# Patient Record
Sex: Female | Born: 1960
Health system: Southern US, Community
[De-identification: ages and names within clinical notes are randomized; demographics above are authoritative.]

## PROBLEM LIST (undated history)

## (undated) DIAGNOSIS — E049 Nontoxic goiter, unspecified: Secondary | ICD-10-CM

## (undated) DIAGNOSIS — D869 Sarcoidosis, unspecified: Secondary | ICD-10-CM

## (undated) DIAGNOSIS — G4733 Obstructive sleep apnea (adult) (pediatric): Secondary | ICD-10-CM

## (undated) DIAGNOSIS — Z8489 Family history of other specified conditions: Secondary | ICD-10-CM

## (undated) DIAGNOSIS — R002 Palpitations: Secondary | ICD-10-CM

## (undated) DIAGNOSIS — T7840XA Allergy, unspecified, initial encounter: Secondary | ICD-10-CM

## (undated) DIAGNOSIS — E559 Vitamin D deficiency, unspecified: Secondary | ICD-10-CM

## (undated) DIAGNOSIS — E785 Hyperlipidemia, unspecified: Secondary | ICD-10-CM

## (undated) DIAGNOSIS — I10 Essential (primary) hypertension: Secondary | ICD-10-CM

## (undated) HISTORY — PX: TONSILLECTOMY: SUR1361

## (undated) HISTORY — DX: Morbid (severe) obesity due to excess calories: E66.01

## (undated) HISTORY — DX: Palpitations: R00.2

## (undated) HISTORY — DX: Hyperlipidemia, unspecified: E78.5

## (undated) HISTORY — PX: ABDOMINAL HYSTERECTOMY: SHX81

## (undated) HISTORY — PX: OTHER SURGICAL HISTORY: SHX169

## (undated) HISTORY — PX: EYE SURGERY: SHX253

## (undated) HISTORY — DX: Obstructive sleep apnea (adult) (pediatric): G47.33

## (undated) HISTORY — DX: Allergy, unspecified, initial encounter: T78.40XA

## (undated) HISTORY — DX: Nontoxic goiter, unspecified: E04.9

## (undated) HISTORY — DX: Vitamin D deficiency, unspecified: E55.9

## (undated) HISTORY — DX: Sarcoidosis, unspecified: D86.9

---

## 1999-01-21 ENCOUNTER — Other Ambulatory Visit: Admission: RE | Admit: 1999-01-21 | Discharge: 1999-01-21 | Payer: Self-pay | Admitting: *Deleted

## 1999-09-15 ENCOUNTER — Encounter (HOSPITAL_COMMUNITY): Admission: RE | Admit: 1999-09-15 | Discharge: 1999-12-14 | Payer: Self-pay | Admitting: Internal Medicine

## 2000-02-11 ENCOUNTER — Encounter: Payer: Self-pay | Admitting: Internal Medicine

## 2000-02-11 ENCOUNTER — Ambulatory Visit (HOSPITAL_COMMUNITY): Admission: RE | Admit: 2000-02-11 | Discharge: 2000-02-11 | Payer: Self-pay | Admitting: Internal Medicine

## 2000-02-11 ENCOUNTER — Other Ambulatory Visit: Admission: RE | Admit: 2000-02-11 | Discharge: 2000-02-11 | Payer: Self-pay | Admitting: *Deleted

## 2001-03-06 ENCOUNTER — Other Ambulatory Visit: Admission: RE | Admit: 2001-03-06 | Discharge: 2001-03-06 | Payer: Self-pay | Admitting: Gynecology

## 2001-05-24 ENCOUNTER — Encounter: Payer: Self-pay | Admitting: Gynecology

## 2001-05-31 ENCOUNTER — Encounter (INDEPENDENT_AMBULATORY_CARE_PROVIDER_SITE_OTHER): Payer: Self-pay | Admitting: Specialist

## 2001-05-31 ENCOUNTER — Inpatient Hospital Stay (HOSPITAL_COMMUNITY): Admission: RE | Admit: 2001-05-31 | Discharge: 2001-06-01 | Payer: Self-pay | Admitting: Gynecology

## 2001-08-01 ENCOUNTER — Encounter: Payer: Self-pay | Admitting: Internal Medicine

## 2001-08-01 ENCOUNTER — Ambulatory Visit (HOSPITAL_COMMUNITY): Admission: RE | Admit: 2001-08-01 | Discharge: 2001-08-01 | Payer: Self-pay | Admitting: Internal Medicine

## 2002-08-28 ENCOUNTER — Ambulatory Visit (HOSPITAL_COMMUNITY): Admission: RE | Admit: 2002-08-28 | Discharge: 2002-08-28 | Payer: Self-pay | Admitting: Internal Medicine

## 2002-08-28 ENCOUNTER — Encounter: Payer: Self-pay | Admitting: Internal Medicine

## 2003-04-30 ENCOUNTER — Encounter: Payer: Self-pay | Admitting: Internal Medicine

## 2003-04-30 ENCOUNTER — Ambulatory Visit (HOSPITAL_COMMUNITY): Admission: RE | Admit: 2003-04-30 | Discharge: 2003-04-30 | Payer: Self-pay | Admitting: Internal Medicine

## 2003-08-26 ENCOUNTER — Other Ambulatory Visit: Admission: RE | Admit: 2003-08-26 | Discharge: 2003-08-26 | Payer: Self-pay | Admitting: Gynecology

## 2004-01-29 ENCOUNTER — Ambulatory Visit (HOSPITAL_COMMUNITY): Admission: RE | Admit: 2004-01-29 | Discharge: 2004-01-29 | Payer: Self-pay | Admitting: Internal Medicine

## 2005-03-15 ENCOUNTER — Other Ambulatory Visit: Admission: RE | Admit: 2005-03-15 | Discharge: 2005-03-15 | Payer: Self-pay | Admitting: Gynecology

## 2005-03-23 ENCOUNTER — Ambulatory Visit (HOSPITAL_COMMUNITY): Admission: RE | Admit: 2005-03-23 | Discharge: 2005-03-23 | Payer: Self-pay | Admitting: Internal Medicine

## 2005-12-29 ENCOUNTER — Encounter: Admission: RE | Admit: 2005-12-29 | Discharge: 2005-12-29 | Payer: Self-pay | Admitting: Internal Medicine

## 2006-04-06 ENCOUNTER — Ambulatory Visit (HOSPITAL_COMMUNITY): Admission: RE | Admit: 2006-04-06 | Discharge: 2006-04-06 | Payer: Self-pay | Admitting: Internal Medicine

## 2006-04-06 ENCOUNTER — Encounter: Payer: Self-pay | Admitting: Vascular Surgery

## 2007-08-25 ENCOUNTER — Emergency Department (HOSPITAL_COMMUNITY): Admission: EM | Admit: 2007-08-25 | Discharge: 2007-08-26 | Payer: Self-pay | Admitting: Emergency Medicine

## 2009-03-21 ENCOUNTER — Encounter: Admission: RE | Admit: 2009-03-21 | Discharge: 2009-03-21 | Payer: Self-pay | Admitting: Internal Medicine

## 2009-12-10 ENCOUNTER — Encounter: Admission: RE | Admit: 2009-12-10 | Discharge: 2009-12-10 | Payer: Self-pay | Admitting: Optometry

## 2010-01-16 ENCOUNTER — Ambulatory Visit: Payer: Self-pay | Admitting: Vascular Surgery

## 2010-01-16 ENCOUNTER — Encounter (INDEPENDENT_AMBULATORY_CARE_PROVIDER_SITE_OTHER): Payer: Self-pay | Admitting: Internal Medicine

## 2010-01-16 ENCOUNTER — Ambulatory Visit: Admission: RE | Admit: 2010-01-16 | Discharge: 2010-01-16 | Payer: Self-pay | Admitting: Internal Medicine

## 2010-07-27 ENCOUNTER — Encounter: Payer: Self-pay | Admitting: Cardiovascular Disease

## 2010-07-30 ENCOUNTER — Encounter: Payer: Self-pay | Admitting: Cardiovascular Disease

## 2010-08-02 ENCOUNTER — Emergency Department (HOSPITAL_COMMUNITY): Admission: EM | Admit: 2010-08-02 | Discharge: 2010-08-03 | Payer: Self-pay | Admitting: Family Medicine

## 2010-08-03 ENCOUNTER — Encounter: Payer: Self-pay | Admitting: Cardiovascular Disease

## 2010-08-04 ENCOUNTER — Ambulatory Visit: Payer: Self-pay | Admitting: Cardiovascular Disease

## 2010-08-10 ENCOUNTER — Encounter: Payer: Self-pay | Admitting: Cardiovascular Disease

## 2010-08-10 ENCOUNTER — Ambulatory Visit: Payer: Self-pay

## 2010-08-10 ENCOUNTER — Ambulatory Visit: Payer: Self-pay | Admitting: Internal Medicine

## 2010-08-10 ENCOUNTER — Ambulatory Visit (HOSPITAL_COMMUNITY): Admission: RE | Admit: 2010-08-10 | Discharge: 2010-08-10 | Payer: Self-pay | Admitting: Cardiovascular Disease

## 2010-08-14 ENCOUNTER — Telehealth: Payer: Self-pay | Admitting: Cardiovascular Disease

## 2010-08-31 ENCOUNTER — Ambulatory Visit: Payer: Self-pay | Admitting: Cardiovascular Disease

## 2010-09-18 ENCOUNTER — Ambulatory Visit: Payer: Self-pay | Admitting: Vascular Surgery

## 2010-09-18 ENCOUNTER — Ambulatory Visit (HOSPITAL_COMMUNITY): Admission: RE | Admit: 2010-09-18 | Discharge: 2010-09-18 | Payer: Self-pay | Admitting: Family Medicine

## 2010-09-18 ENCOUNTER — Encounter (INDEPENDENT_AMBULATORY_CARE_PROVIDER_SITE_OTHER): Payer: Self-pay | Admitting: Family Medicine

## 2010-12-03 NOTE — Letter (Signed)
Summary: [facility]  [facility]   Imported By: Marylou Mccoy 09/21/2010 13:56:36  _____________________________________________________________________  External Attachment:    Type:   Image     Comment:   External Document

## 2010-12-03 NOTE — Letter (Signed)
Summary: [facility]  [facility]   Imported By: Marylou Mccoy 09/21/2010 13:34:37  _____________________________________________________________________  External Attachment:    Type:   Image     Comment:   External Document

## 2010-12-03 NOTE — Assessment & Plan Note (Signed)
Summary: per check out/sf   Visit Type:  1 mo f/u Primary Provider:  Dr. Oneta Rack  CC:  NO chest pain or SOB.  History of Present Illness: 50 yo female with history of HTN, hyperlipidemia and DM here  today for follow up. She was seen as a new patient three weeks ago for further evaluation of uncontrolled HTN. She has been seen by Dr. Oneta Rack  and managed appropriately with Diovan/HCT, Atenolol and clonidine.  No dizziness, near syncope or syncope. No visual changes. She did  describe "hot flashes". At the first visit, I added Norvasc 5 mg per day and ordered an echo and renal artery dopplers. These results are outlined below. She has been feeling much better since her last visit. No complaints today.    Current Medications (verified): 1)  Atenolol 100 Mg Tabs (Atenolol) .Marland Kitchen.. 1 Tab Once Daily 2)  Diovan 320 Mg Tabs (Valsartan) .Marland Kitchen.. 1 Tab Once Daily 3)  Metformin Hcl 500 Mg Tabs (Metformin Hcl) .... 2 Tab Two Times A Day 4)  Clonidine Hcl 0.1 Mg Tabs (Clonidine Hcl) .Marland Kitchen.. 1 Tab Three Times A Day 5)  Vitamin D 1000 Unit Tabs (Cholecalciferol) .Marland Kitchen.. 1 Tab Weekly 6)  Multivitamins   Tabs (Multiple Vitamin) .Marland Kitchen.. 1 Tab Once Daily 7)  Amlodipine Besylate 5 Mg Tabs (Amlodipine Besylate) .... Take One Tablet By Mouth Daily 8)  Magnesium Oxide 400 Mg Caps (Magnesium Oxide) .Marland Kitchen.. 1 Cap Once Daily  Allergies (verified): No Known Drug Allergies  Past History:  Past Medical History: Reviewed history from 08/03/2010 and no changes required. DM (ICD-250.00) HYPERLIPIDEMIA (ICD-272.4) HYPERTENSION (ICD-401.9)    Social History: Reviewed history from 08/04/2010 and no changes required. No alcohol, drugs or tobacco. 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    Vital Signs:  Patient profile:   50 year old female Height:      65 inches Weight:      234.8 pounds BMI:     39.21 Pulse rate:   58 / minute Pulse rhythm:   irregular BP sitting:   160 / 80  (left arm) Cuff size:   large  Vitals Entered By: Danielle Rankin, CMA (August 31, 2010 4:25 PM)  Physical Exam  General:  General: Well developed, well nourished, NAD HEENT: OP clear, mucus membranes moist Musculoskeletal: Muscle strength 5/5 all ext Psychiatric: Mood and affect normal Neck: No JVD, no carotid bruits, no thyromegaly, no lymphadenopathy. Lungs:Clear bilaterally, no wheezes, rhonci, crackles CV: RRR no murmurs, gallops rubs Abdomen: soft, NT, ND, BS present Extremities: No edema, pulses 2+.    Arterial Doppler  Procedure date:  08/10/2010  Findings:      No evidence of renal artery stenosis.    Echocardiogram  Procedure date:  08/10/2010  Findings:      Left ventricle: The cavity size was normal. Wall thickness was     normal. Systolic function was normal. The estimated ejection     fraction was in the range of 60% to 65%. Wall motion was normal;     there were no regional wall motion abnormalities.   Impression &  Recommendations:  Problem # 1:  HYPERTENSION (ICD-401.9) No evidence of hypertensive heart disease on echo. No evidence of renal artery stenosis on dopplers. I will increase her Norvasc to 10 mg per day. No reversible causes of HTN isolated. I will have her return back to see Dr. Oneta Rack. Consider workup for pheochromocytoma in primary care. Also can consider switching her atenolol to Coreg for better BP control.   Her updated medication list for this problem  includes:    Atenolol 100 Mg Tabs (Atenolol) .Marland Kitchen... 1 tab once daily    Diovan 320 Mg Tabs (Valsartan) .Marland Kitchen... 1 tab once daily    Clonidine Hcl 0.1 Mg Tabs (Clonidine hcl) .Marland Kitchen... 1 tab three times a day    Amlodipine Besylate 10 Mg Tabs (Amlodipine besylate) .Marland Kitchen... Take one tablet by mouth daily  Patient Instructions: 1)  Your physician recommends that you schedule a follow-up appointment as needed. 2)  Your physician has recommended you make the following change in your medication: INCREASE NORVASC to 10mg  by mouth daily.  Prescriptions: AMLODIPINE BESYLATE 10 MG TABS (AMLODIPINE BESYLATE) Take one tablet by mouth daily  #30 x 13   Entered by:   Whitney Maeola Sarah RN   Authorized by:   Verne Carrow, MD   Signed by:   Ellender Hose RN on 08/31/2010   Method used:   Electronically to        [facility] [address] [phone]* (retail)       [address]       [place], [place]  [address]       Ph: [phone] or [phone]       Fax: [phone]   RxID:   [id number]

## 2010-12-03 NOTE — Letter (Signed)
Summary: Return To Work  [employer], Main Office  [address]   [place], [place] [address]   Phone: [phone]  Fax: [phone]    08/04/2010  TO: Leodis Sias IT MAY CONCERN   RE: JAZMINN POMALES [address] [place]   The above named individual is under my medical care and may return to work on Thursday Oct. 6th, 2011. Please excuse her for any absences she has had at work.   If you have any further questions or need additional information, please call our office at [phone].    Sincerely,  Dr. Verne Carrow.

## 2010-12-03 NOTE — Progress Notes (Signed)
Summary: test results  Phone Note Call from Patient Call back at Work Phone [phone] Call back at [phone]   Caller: Patient Reason for Call: Lab or Test Results Initial call taken by: Judie Grieve,  August 14, 2010 11:04 AM  Follow-up for Phone Call        pt aware Follow-up by: Whitney Maeola Sarah RN,  August 14, 2010 11:32 AM

## 2010-12-03 NOTE — Letter (Signed)
Summary: [facility]  [facility]   Imported By: Marylou Mccoy 09/21/2010 13:55:32  _____________________________________________________________________  External Attachment:    Type:   Image     Comment:   External Document

## 2010-12-03 NOTE — Assessment & Plan Note (Signed)
Summary: np6/ accelerate htn, no responding to treatment. pt has uhc/ gd   Visit Type:  new pt visit Primary Tanya [name]:  Dr. Oneta Rack  CC:  elevated BP no responding to Tx....denies any other complaints today.  History of Present Illness: 50 yo female with history of HTN, hyperlipidemia and DM referred today for further evaluation of uncontrolled HTN. She has been seen by Dr. Oneta Rack  and managed appropriately with Diovan/HCT, Atenolol and clonidine. She had been on Hyzaar for many years with good control of her BP. She went to the beach three weeks ago and had frontal headache, nausea and hot flashes. Last week, she still felt bad and her BP was elevated with systolic BP of 170s. She began to feel better. She went in to the hospital two nights ago and her BP was 200 systolically. She was discharged home with systolic blood pressure of 160s. Over the last two days, she describes a dull headache. She has had no chest pain or SOB. No dizziness, near syncope or syncope. No visual changes. She does describe "hot flashes".    Current Medications (verified): 1)  Atenolol 100 Mg Tabs (Atenolol) .Marland Kitchen.. 1 Tab Once Daily 2)  Diovan 320 Mg Tabs (Valsartan) .Marland Kitchen.. 1 Tab Once Daily 3)  Metformin Hcl 500 Mg Tabs (Metformin Hcl) .... 2 Tab Two Times A Day 4)  Clonidine Hcl 0.1 Mg Tabs (Clonidine Hcl) .Marland Kitchen.. 1 Tab Once Daily 5)  Vitamin D 1000 Unit Tabs (Cholecalciferol) .Marland Kitchen.. 1 Tab Weekly 6)  Multivitamins   Tabs (Multiple Vitamin) .Marland Kitchen.. 1 Tab Once Daily  Allergies (verified): No Known Drug Allergies  Past History:  Past Medical History: Reviewed history from 08/03/2010 and no changes required. DM (ICD-250.00) HYPERLIPIDEMIA (ICD-272.4) HYPERTENSION (ICD-401.9)    Past Surgical History: Achilles tendon repair May 2011 Hysterectomy 2001 Tonsillectomy as child  Family History: Mother-alive, HTN Father-alive, DM, HTN 1 sister and 1 brother both alive and healthy  Social History: No alcohol, drugs or  tobacco. 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  The patient denies fatigue, malaise, fever, weight gain/loss, vision loss, decreased hearing, hoarseness, chest pain, palpitations, shortness of breath, prolonged cough, wheezing, sleep apnea, coughing up blood, abdominal pain, blood in stool, nausea, vomiting, diarrhea, heartburn, incontinence, blood in urine, muscle weakness, joint pain, leg swelling, rash, skin lesions, fainting, dizziness, depression, anxiety, enlarged lymph nodes, easy bruising or bleeding, and environmental allergies.    Vital Signs:  Patient profile:   50 year old female Height:      65 inches Weight:      226.12 pounds BMI:     37.76 Pulse rate:   53 / minute Pulse rhythm:   irregular BP sitting:   160 / 90  (left arm) Cuff size:   large  Vitals Entered By: Danielle Rankin, CMA (August 04, 2010 10:46 AM)  Physical Exam  General:  General: Well developed, well nourished, NAD HEENT: OP clear, mucus membranes moist SKIN: warm, dry Neuro: No focal deficits Musculoskeletal: Muscle strength 5/5 all ext Psychiatric: Mood and affect normal Neck: No JVD, no carotid bruits, no thyromegaly, no lymphadenopathy. Lungs:Clear bilaterally, no wheezes, rhonci, crackles CV: RRR no murmurs, gallops rubs Abdomen: soft, NT, ND, BS present Extremities: No edema, pulses 2+.    EKG  Procedure date:  08/04/2010  Findings:      Sinus bradycardia, rate 53 bpm. T wave inversion III, AVF, V4-V5.  Impression &  Recommendations:  Problem # 1:  HYPERTENSION (ICD-401.9) Recently uncontrolled HTN. I agree with her current medication regimen. Will add Norvasc 5 mg by mouth Qdaily. Consider changed beta blocker to Coreg for better BP control. Will order echo to assess LV systolic function and LV size/thickness. Will order renal artery dopplers to exclude renal artery stenosis. I will see her back in several weeks to  review.  She will continue to follow her BP at home. If the renal artery dopplers are normal, will suggest that a workup to exclude  a catecholamine secreting tumor like pheochromocytoma be initiated in primary care. (24 hour urine catecholamines and metanephrines).  Her updated medication list for this problem includes:    Atenolol 100 Mg Tabs (Atenolol) .Marland Kitchen... 1 tab once daily    Diovan 320 Mg Tabs (Valsartan) .Marland Kitchen... 1 tab once daily    Clonidine Hcl 0.1 Mg Tabs (Clonidine hcl) .Marland Kitchen... 1 tab once daily    Amlodipine Besylate 5 Mg Tabs (Amlodipine besylate) .Marland Kitchen... Take one tablet by mouth daily  Orders: Echocardiogram (Echo) Renal Artery Duplex (Renal Artery Duplex) EKG w/ Interpretation (93000)  Patient Instructions: 1)  Your physician recommends that you schedule a follow-up appointment in: 3-4 weeks. 2)  Your physician has requested that you have a renal artery duplex. During this test, an ultrasound is used to evaluate blood flow to the kidneys. Allow one hour for this exam. Do not eat after midnight the day before and avoid carbonated beverages. Take your medications as you usually do. 3)  Your physician has requested that you have an echocardiogram.  Echocardiography is a painless test that uses sound waves to create images of your heart. It provides your doctor with information about the size and shape of your heart and how well your heart's chambers and valves are working.  This procedure takes approximately one hour. There are no restrictions for this procedure. 4)  Your physician has recommended you make the following change in your medication: START AMLODIPINE 5 mg by mouth daily for you blood pressure. Prescriptions: AMLODIPINE BESYLATE 5 MG TABS (AMLODIPINE BESYLATE) Take one tablet by mouth daily  #30 x 3   Entered by:   Whitney Maeola Sarah RN   Authorized by:   Verne Carrow, MD   Signed by:   Ellender Hose RN on 08/04/2010   Method used:   [facility]        [facility] [address] [phone]* (retail)       [address]       [place], [place]  [address]       Ph: [phone] or [phone]       Fax: [phone]   RxID:   [id number]

## 2011-01-13 LAB — URINALYSIS, ROUTINE W REFLEX MICROSCOPIC
Glucose, UA: NEGATIVE mg/dL
Ketones, ur: NEGATIVE mg/dL
Nitrite: NEGATIVE
Specific Gravity, Urine: 1.012 (ref 1.005–1.030)
Urobilinogen, UA: 0.2 mg/dL (ref 0.0–1.0)
pH: 6 (ref 5.0–8.0)

## 2011-01-13 LAB — BASIC METABOLIC PANEL
BUN: 13 mg/dL (ref 6–23)
Calcium: 9.4 mg/dL (ref 8.4–10.5)
Chloride: 100 mEq/L (ref 96–112)
Creatinine, Ser: 0.58 mg/dL (ref 0.4–1.2)
GFR calc Af Amer: >60 mL/min (ref >60)
GFR calc non Af Amer: >60 mL/min (ref >60)
Potassium: 4.1 mEq/L (ref 3.5–5.1)
Sodium: 139 mEq/L (ref 135–145)

## 2011-01-13 LAB — URINE CULTURE: Culture  Setup Time: 201110030904

## 2011-01-13 LAB — URINE MICROSCOPIC-ADD ON

## 2011-03-19 NOTE — H&P (Signed)
[facility]  Patient:    Tanya [name], Tanya [name]                 MRN: [id number] Adm. Date:  [id number] Attending:  Rolinda Roan CC:         Marinus Maw, M.D.   History and Physical  ADMITTING DIAGNOSIS:  Fibroid uterus.  HISTORY OF PRESENT ILLNESS:  The patient is a 50 year old nulligravida black female who was kindly referred by Dr. Marinus Maw with a fibroid uterus, increasing dysmenorrhea and menorrhagia and anemia, for evaluation and treatment.  The patient underwent an abdominal myomectomy by another physician approximately four to five years ago.  She has been on Loestrin Fe 1.5/30 for the last four to five years and always has dysmenorrhea, which has been increasing, and her menorrhagia has been increasing significantly and the patient is currently anemic with a hemoglobin approximately 9.  The patient wishes never to be pregnant.  Multiple options were given to the patient regarding possible treatments of her fibroids and she wishes to proceed with hysterectomy.  Ultrasound examination revealed multiple fibroids with normal-appearing ovaries.  A total abdominal hysterectomy was discussed with the patient in detail and complications including, but not limited to, injury to the bowel, bladder, ureters, possible fistula formation, possible blood loss with transfusion and its sequelae and possible infection in the pelvis or wound have been discussed with the patient.  Increased risks secondary to the patients obesity and previous myomectomy have been discussed.  She wishes that the ovaries be taken only if surgically indicated or significant disease. Patient requested that if there was a borderline amount of adhesions involving the ovaries, that the ovaries be left.  The patient is aware that she may have postoperative pain or other problems involving the ovaries that would require a subsequent surgery to remove the ovaries.   Patient understands that she will have no menstrual periods and no possibility for pregnancy after hysterectomy. Postoperative expectations and restrictions have been reviewed with the patient.  The patient is therefore admitted for a total abdominal hysterectomy, ? bilateral salpingo-oophorectomy.  PAST MEDICAL HISTORY:  Surgical:  T&A, appendectomy, myomectomy.  Medical: Hypertension.  MEDICATIONS:  Hyzaar, ______ and the patient had been taking Loestrin, which she has stopped preoperatively.  ALLERGIES:  None known.  SOCIAL HISTORY:  Smokes none.  ETOH:  None.  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  FAMILY HISTORY:  Negative for carcinoma.  PHYSICAL EXAMINATION:  HEENT:  Negative.  HEART:  Without murmurs.  LUNGS:  Clear.  BREASTS:  Without masses or discharge.  ABDOMEN:  Obese, soft and nontender.  PELVIC:  Exam reveals BUS, vagina and cervix to be normal, uterus approximately 16+ weeks size and irregular.  The adnexa are without palpable masses.  RECTAL:  Negative.  EXTREMITIES:  Negative.  IMPRESSION: 1. Menorrhagia. 2. Anemia. 3. Fibroid uterus. 4. Hypertension. 5. Dysmenorrhea. 6. Obesity.  PLAN:  Total abdominal hysterectomy and ? bilateral salpingo-oophorectomy. DD:  05/31/01 TD:  06/01/01 Job: [id number]

## 2011-03-19 NOTE — Op Note (Signed)
[facility]  Patient:    Tanya [name], Tanya [name]                 MRN: [id number] Proc. Date: 05/31/01 Adm. Date:  [id number] Attending:  Rolinda Roan CC:         Marinus Maw, M.D.  Harl Bowie, M.D.   Operative Report  PREOPERATIVE DIAGNOSES:  Fibroid uterus, menorrhagia, and anemia.  POSTOPERATIVE DIAGNOSES:  Fibroid uterus, menorrhagia, anemia, and pelvic adhesions.  OPERATION PERFORMED:  Total abdominal hysterectomy and enterolysis of adhesions.  SURGEON:  Leatha Gilding. Mezer, M.D.  ASSISTANT:  Harl Bowie, M.D.  ANESTHESIA:  General endotracheal.  PREPARATION:  Betadine.  DESCRIPTION OF PROCEDURE:  With the patient in the supine position, prepped and draped in the routine fashion.  A Pfannenstiel incision was made through an old scar.  The incision was carried down to the subcutaneous tissue where there was significant scarring, and the fascia was severely scarred.  A very careful dissection was employed to open the fascia to give optimal exposure and optimal potential for good closure.  The peritoneum was opened without difficulty, and a brief exploration of her abdomen was benign.  Exploration of the pelvis revealed the uterus to be approximately six weeks in size with multiple leiomyomata, and the bladder was significantly advanced on the uterus.  There were dense adhesions of bowel to the posterior and inferior aspect of the uterus.  A significant amount of time at the beginning of the procedure was required to restore normal anatomy and taking these adhesions down in layers, using cautery and a scissor dissection.  There appeared to be no trauma to the bowel.  Both ovaries were lightly adherent to the pelvic sidewalls, and the ovary on the right was adherent to the posterior broad ligament.  Per the patients strong request, the ovaries were left for a more careful evaluation at the end of the procedure.  After the  adhesions had been lysed, freeing the bowel from the uterus, the round ligaments were suture ligated with #1 chromic and divided.  The location of the fibroids made the beginning part of the procedure quite challenging.  The bladder flap which had been significantly advanced was taken down again in layers.  There was a large fibroid in the right broad ligament that required dissection around to isolate the uterine arteries.  These were clamped.  A similar but not as difficult dissection was required on the left side.  The uterine arteries were also clamped.  A significant amount of lateral stretch was required on both sides to clamp the uterine arteries, and the decision was made to amputate the fundus to reduce the chance that the pedicles would slip during the suturing. Very slowly and carefully, the fundus was then excised.  Although exposure was limited, this was done safely without injury to the bowel.  The uterine artery pedicles were then suture ligated with #1 chromic, the cardinal ligaments taken in several bites, clamped, cut, and suture ligated with #1 chromic.  The uterosacral ligaments were very prominent, taken separately, clamped, cut, and suture ligated with #1 chromic.  The vagina was entered laterally on the right side, and the specimen excised with circumferential dissection.  The angles were closed with Telinde type angle sutures of #1 chromic, and the cuff was whipped anteriorly and posteriorly with running lock #1 chromic suture.  A single anterior posterior stitch was placed to decrease the opening of the vaginal cuff.  There were  a significant number of bleeding points on the peritoneal surface of the bladder which were arrested with careful cautery. With hemostasis intact, the bladder was placed over the vaginal cuff with a 3-0 Vicryl suture.  The tubes and ovaries were then carefully inspected and although there were some adhesions around the tubes and ovaries, they  appeared not to be on tension and per the patients request to leave the ovaries if there was minimal pathology, they were left in situ.  The ureters were inspected and found to be out of harms way and with hemostasis intact, an effort was made to place the large bowel in the cul-de-sac.  The omentum was brought down.  The abdomen was closed in layers using a running 2-0 Vicryl on the peritoneum, running 0 Vicryl to the midline bilaterally on the fascia. Hemostasis was assured in the subcutaneous tissue, and the skin was closed with staples.  The estimated blood loss was approximately 250 cc.  The sponge, needle, and instrument counts were correct x 2.  The patient was thought to do well and was taken to the recovery room in satisfactory condition. DD:  05/31/01 TD:  06/01/01 Job: [id number]

## 2011-05-27 ENCOUNTER — Ambulatory Visit
Admission: RE | Admit: 2011-05-27 | Discharge: 2011-05-27 | Disposition: A | Discharge status: Home / Self Care | Payer: 59 | Source: Ambulatory Visit | Attending: Ophthalmology | Admitting: Ophthalmology

## 2011-05-27 ENCOUNTER — Other Ambulatory Visit: Payer: Self-pay | Admitting: Ophthalmology

## 2011-05-27 DIAGNOSIS — H4720 Unspecified optic atrophy: Secondary | ICD-10-CM

## 2011-05-27 MED ORDER — IOHEXOL 300 MG/ML  SOLN
75.0000 mL | Freq: Once | INTRAMUSCULAR | Status: AC | PRN
  Administered 2011-05-27: 75 mL via INTRAVENOUS

## 2011-07-20 ENCOUNTER — Other Ambulatory Visit: Payer: Self-pay | Admitting: Internal Medicine

## 2011-07-20 DIAGNOSIS — D869 Sarcoidosis, unspecified: Secondary | ICD-10-CM

## 2011-07-27 ENCOUNTER — Ambulatory Visit
Admission: RE | Admit: 2011-07-27 | Discharge: 2011-07-27 | Disposition: A | Discharge status: Home / Self Care | Payer: 59 | Source: Ambulatory Visit | Attending: Internal Medicine | Admitting: Internal Medicine

## 2011-07-27 ENCOUNTER — Other Ambulatory Visit: Payer: Self-pay | Admitting: Internal Medicine

## 2011-07-27 DIAGNOSIS — D869 Sarcoidosis, unspecified: Secondary | ICD-10-CM

## 2011-07-27 DIAGNOSIS — E041 Nontoxic single thyroid nodule: Secondary | ICD-10-CM

## 2011-07-27 MED ORDER — IOHEXOL 300 MG/ML  SOLN: 75.0000 mL | Freq: Once | INTRAMUSCULAR | Status: AC | PRN

## 2011-08-04 ENCOUNTER — Ambulatory Visit
Admission: RE | Admit: 2011-08-04 | Discharge: 2011-08-04 | Disposition: A | Discharge status: Home / Self Care | Payer: 59 | Source: Ambulatory Visit | Attending: Internal Medicine | Admitting: Internal Medicine

## 2011-08-04 DIAGNOSIS — E041 Nontoxic single thyroid nodule: Secondary | ICD-10-CM

## 2011-08-09 ENCOUNTER — Other Ambulatory Visit: Payer: Self-pay | Admitting: Internal Medicine

## 2011-08-09 DIAGNOSIS — E041 Nontoxic single thyroid nodule: Secondary | ICD-10-CM

## 2011-08-11 ENCOUNTER — Ambulatory Visit
Admission: RE | Admit: 2011-08-11 | Discharge: 2011-08-11 | Disposition: A | Discharge status: Home / Self Care | Payer: 59 | Source: Ambulatory Visit | Attending: Internal Medicine | Admitting: Internal Medicine

## 2011-08-11 ENCOUNTER — Other Ambulatory Visit (HOSPITAL_COMMUNITY)
Admission: RE | Admit: 2011-08-11 | Discharge: 2011-08-11 | Disposition: A | Discharge status: Home / Self Care | Payer: 59 | Source: Ambulatory Visit | Attending: Interventional Radiology | Admitting: Interventional Radiology

## 2011-08-11 DIAGNOSIS — E049 Nontoxic goiter, unspecified: Secondary | ICD-10-CM | POA: Insufficient documentation

## 2011-08-11 DIAGNOSIS — E041 Nontoxic single thyroid nodule: Secondary | ICD-10-CM

## 2011-08-11 LAB — CARBOXYHEMOGLOBIN: Methemoglobin: 1.4

## 2011-09-08 ENCOUNTER — Other Ambulatory Visit (HOSPITAL_COMMUNITY): Payer: Self-pay | Admitting: Internal Medicine

## 2011-09-08 DIAGNOSIS — E039 Hypothyroidism, unspecified: Secondary | ICD-10-CM

## 2011-09-20 ENCOUNTER — Encounter (HOSPITAL_COMMUNITY)
Admission: RE | Admit: 2011-09-20 | Discharge: 2011-09-20 | Disposition: A | Discharge status: Home / Self Care | Payer: 59 | Source: Ambulatory Visit | Attending: Internal Medicine | Admitting: Internal Medicine

## 2011-09-20 DIAGNOSIS — E039 Hypothyroidism, unspecified: Secondary | ICD-10-CM | POA: Insufficient documentation

## 2011-09-20 MED ORDER — SODIUM IODIDE I 131 CAPSULE
20.0000 | Freq: Once | INTRAVENOUS | Status: AC | PRN
  Administered 2011-09-20: 20 via ORAL

## 2011-09-21 ENCOUNTER — Ambulatory Visit (HOSPITAL_COMMUNITY)
Admission: RE | Admit: 2011-09-21 | Discharge: 2011-09-21 | Disposition: A | Discharge status: Home / Self Care | Payer: 59 | Source: Ambulatory Visit | Attending: Internal Medicine | Admitting: Internal Medicine

## 2011-09-21 DIAGNOSIS — E041 Nontoxic single thyroid nodule: Secondary | ICD-10-CM | POA: Insufficient documentation

## 2011-09-21 DIAGNOSIS — E079 Disorder of thyroid, unspecified: Secondary | ICD-10-CM | POA: Insufficient documentation

## 2011-09-21 DIAGNOSIS — E039 Hypothyroidism, unspecified: Secondary | ICD-10-CM | POA: Insufficient documentation

## 2011-09-21 MED ORDER — SODIUM IODIDE I 131 CAPSULE
20.0000 | Freq: Once | INTRAVENOUS | Status: AC | PRN
  Administered 2011-09-20: 20 via ORAL

## 2011-09-21 MED ORDER — SODIUM PERTECHNETATE TC 99M INJECTION
10.5000 | Freq: Once | INTRAVENOUS | Status: AC | PRN
  Administered 2011-09-21: 11 via INTRAVENOUS

## 2011-10-17 ENCOUNTER — Encounter: Payer: Self-pay | Admitting: Emergency Medicine

## 2011-10-17 ENCOUNTER — Emergency Department (HOSPITAL_COMMUNITY)
Admission: EM | Admit: 2011-10-17 | Discharge: 2011-10-17 | Disposition: A | Discharge status: Home / Self Care | Payer: 59 | Attending: Emergency Medicine | Admitting: Emergency Medicine

## 2011-10-17 DIAGNOSIS — H43399 Other vitreous opacities, unspecified eye: Secondary | ICD-10-CM | POA: Insufficient documentation

## 2011-10-17 DIAGNOSIS — D869 Sarcoidosis, unspecified: Secondary | ICD-10-CM | POA: Insufficient documentation

## 2011-10-17 DIAGNOSIS — Z79899 Other long term (current) drug therapy: Secondary | ICD-10-CM | POA: Insufficient documentation

## 2011-10-17 DIAGNOSIS — R51 Headache: Secondary | ICD-10-CM | POA: Insufficient documentation

## 2011-10-17 DIAGNOSIS — R42 Dizziness and giddiness: Secondary | ICD-10-CM | POA: Insufficient documentation

## 2011-10-17 DIAGNOSIS — I10 Essential (primary) hypertension: Secondary | ICD-10-CM | POA: Insufficient documentation

## 2011-10-17 DIAGNOSIS — E119 Type 2 diabetes mellitus without complications: Secondary | ICD-10-CM | POA: Insufficient documentation

## 2011-10-17 DIAGNOSIS — Z7982 Long term (current) use of aspirin: Secondary | ICD-10-CM | POA: Insufficient documentation

## 2011-10-17 HISTORY — DX: Essential (primary) hypertension: I10

## 2011-10-17 HISTORY — DX: Sarcoidosis, unspecified: D86.9

## 2011-10-17 NOTE — ED Provider Notes (Addendum)
History     CSN: [id number] Arrival date & time: 10/17/2011  2:52 PM   First MD Initiated Contact with Patient 10/17/11 1624      Chief Complaint  Patient presents with  . Eye Problem    HPI  50yoF h/o DM, HTN pw Rt eye floaters. Pt states that over one week ago she saw a flashing black light in her Rt eye. States that she was seen by her optometrist with nl exam 4 days ago. 3 days ago she began to have painless blac k floaters in Rt eye. Cont to deny change in vision. She states today she had headache and lightheadedness which have resolved. No redness in eye. No contacts, no exposures. No h/o Afib. H/o sarcoidosis of the eye on prednisone.   ED Notes, ED Provider Notes from 10/17/11 0000 to 10/17/11 15:18:36       Rulon Eisenmenger, RN 10/17/2011 15:15      Pt c/o floaters in right eye onset Friday. St's at times she has a headache and dizziness     Past Medical History  Diagnosis Date  . Diabetes mellitus   . Hypertension   . Sarcoidosis     Past Surgical History  Procedure Date  . Eye surgery     No family history on file.  History  Substance Use Topics  . Smoking status: Never Smoker   . Smokeless tobacco: Not on file  . Alcohol Use: No    OB History    Grav Para Term Preterm Abortions TAB SAB Ect Mult Living                  Review of Systems  All other systems reviewed and are negative.   except as noted HPI   Allergies  Review of patient's allergies indicates no known allergies.  Home Medications   Current Outpatient Rx  Name Route Sig Dispense Refill  . AMLODIPINE-OLMESARTAN 10-20 MG PO TABS Oral Take 1 tablet by mouth daily.      . ASPIRIN EC 81 MG PO TBEC Oral Take 81 mg by mouth daily.      . ATENOLOL 100 MG PO TABS Oral Take 100 mg by mouth every evening.      Marland Kitchen VITAMIN D PO Oral Take 1 tablet by mouth daily.      Marland Kitchen CLONIDINE HCL 0.1 MG PO TABS Oral Take 0.1 mg by mouth 3 (three) times daily.      . FUROSEMIDE 40 MG PO TABS Oral Take  40 mg by mouth 2 (two) times daily.      Marland Kitchen METFORMIN HCL 500 MG PO TABS Oral Take 1,000 mg by mouth 2 (two) times daily with a meal.      . PREDNISONE 2.5 MG PO TABS Oral Take 2.5 mg by mouth daily.      Marland Kitchen SAXAGLIPTIN HCL 2.5 MG PO TABS Oral Take 5 mg by mouth every morning.        BP 173/83  Pulse 58  Resp 16  SpO2 100%  Physical Exam  Nursing note and vitals reviewed. Constitutional: She is oriented to person, place, and time. She appears well-developed.  HENT:  Head: Atraumatic.  Mouth/Throat: Oropharynx is clear and moist.  Eyes: Conjunctivae and EOM are normal. Pupils are equal, round, and reactive to light.       Rt eye slit lamp exam unremarkable No flushing No conjunctival erythema  U/S R eye without evidence of retinal detachment  Neck: Normal range of  motion. Neck supple.  Cardiovascular: Normal rate, regular rhythm, normal heart sounds and intact distal pulses.   Pulmonary/Chest: Effort normal and breath sounds normal. No respiratory distress. She has no wheezes. She has no rales.  Abdominal: Soft. She exhibits no distension. There is no tenderness. There is no rebound and no guarding.  Musculoskeletal: Normal range of motion.  Neurological: She is alert and oriented to person, place, and time.  Skin: Skin is warm and dry. No rash noted.  Psychiatric: She has a normal mood and affect.    ED Course  Procedures (including critical care time)  Labs Reviewed - No data to display No results found.   1. Visual floaters     MDM  Visual floaters. Visual acuity wnl. No pain. U/S eye and slit lamp unremarkable. States she has f/u with her optometrist tomorrow. Pt encouraged to keep her f/u. Precautions for return.  Stefano Gaul, MD         Forbes Cellar, MD 10/17/11 1739  Forbes Cellar, MD 10/17/11 1740

## 2011-10-17 NOTE — ED Notes (Signed)
Pt c/o floaters in right eye onset Friday.  St's at times she has a headache and dizziness

## 2012-09-20 ENCOUNTER — Encounter: Payer: Self-pay | Admitting: Internal Medicine

## 2012-11-10 ENCOUNTER — Ambulatory Visit (AMBULATORY_SURGERY_CENTER): Payer: 59 | Admitting: *Deleted

## 2012-11-10 VITALS — Ht 66.0 in | Wt 239.4 lb

## 2012-11-10 DIAGNOSIS — Z1211 Encounter for screening for malignant neoplasm of colon: Secondary | ICD-10-CM

## 2012-11-10 MED ORDER — PEG-KCL-NACL-NASULF-NA ASC-C 100 G PO SOLR: ORAL | Status: DC

## 2012-11-10 NOTE — Progress Notes (Signed)
No allergies to eggs or soy products 

## 2012-11-24 ENCOUNTER — Ambulatory Visit (AMBULATORY_SURGERY_CENTER): Payer: 59 | Admitting: Internal Medicine

## 2012-11-24 ENCOUNTER — Encounter: Payer: Self-pay | Admitting: Internal Medicine

## 2012-11-24 VITALS — BP 144/83 | HR 50 | Temp 97.8°F | Resp 11 | Ht 66.0 in | Wt 239.4 lb

## 2012-11-24 DIAGNOSIS — Z1211 Encounter for screening for malignant neoplasm of colon: Secondary | ICD-10-CM

## 2012-11-24 LAB — GLUCOSE, CAPILLARY: Glucose-Capillary: 107 mg/dL — ABNORMAL HIGH (ref 70–99)

## 2012-11-24 MED ORDER — SODIUM CHLORIDE 0.9 % IV SOLN: 500.0000 mL | INTRAVENOUS | Status: DC

## 2012-11-24 NOTE — Op Note (Signed)
[facility] [address] [place], [address]   COLONOSCOPY PROCEDURE REPORT  PATIENT: Tanya, [name]  MR#: [id number] BIRTHDATE: 11-27-1960 , 51  yrs. old GENDER: Female ENDOSCOPIST: Beverley Fiedler, MD REFERRED XB:[id number], William PROCEDURE DATE:  11/24/2012 PROCEDURE:   Colonoscopy, screening ASA CLASS:   Class II INDICATIONS:average risk screening and first colonoscopy. MEDICATIONS: MAC sedation, administered by CRNA and Propofol (Diprivan)  DESCRIPTION OF PROCEDURE:   After the risks benefits and alternatives of the procedure were thoroughly explained, informed consent was obtained.  A digital rectal exam revealed no rectal mass.   The LB [id number]  endoscope was introduced through the anus and advanced to the terminal ileum which was intubated for a short distance. No adverse events experienced.   The quality of the prep was good, using MoviPrep  The instrument was then slowly withdrawn as the colon was fully examined.    COLON FINDINGS: The mucosa appeared normal in the terminal ileum. A normal appearing cecum, ileocecal valve, and appendiceal orifice were identified.  The ascending, hepatic flexure, transverse, splenic flexure, descending, sigmoid colon and rectum appeared unremarkable.  No polyps or cancers were seen.  Retroflexed views revealed no abnormalities. The time to cecum=5 minutes 28 seconds. Withdrawal time=8 minutes 15 seconds.  The scope was withdrawn and the procedure completed.  COMPLICATIONS: There were no complications.  ENDOSCOPIC IMPRESSION: 1.   Normal mucosa in the terminal ileum 2.   Normal colon  RECOMMENDATIONS: You should continue to follow colorectal cancer screening guidelines for "routine risk" patients with a repeat colonoscopy in 10 years. There is no need for FOBT (stool) testing for at least 5 years.   eSigned:  Beverley Fiedler, MD 11/24/2012 8:41 AM   cc: The Patient and Lucky Cowboy, MD

## 2012-11-24 NOTE — Progress Notes (Signed)
Patient did not experience any of the following events: a burn prior to discharge; a fall within the facility; wrong site/side/patient/procedure/implant event; or a hospital transfer or hospital admission upon discharge from the facility. (G8907) Patient did not have preoperative order for IV antibiotic SSI prophylaxis. (G8918)  

## 2012-11-24 NOTE — Patient Instructions (Addendum)
YOU HAD AN ENDOSCOPIC PROCEDURE TODAY AT THE [facility]: Refer to the procedure report that was given to you for any specific questions about what was found during the examination.  If the procedure report does not answer your questions, please call your gastroenterologist to clarify.  If you requested that your care partner not be given the details of your procedure findings, then the procedure report has been included in a sealed envelope for you to review at your convenience later.  YOU SHOULD EXPECT: Some feelings of bloating in the abdomen. Passage of more gas than usual.  Walking can help get rid of the air that was put into your GI tract during the procedure and reduce the bloating. If you had a lower endoscopy (such as a colonoscopy or flexible sigmoidoscopy) you may notice spotting of blood in your stool or on the toilet paper. If you underwent a bowel prep for your procedure, then you may not have a normal bowel movement for a few days.  DIET: Your first meal following the procedure should be a light meal and then it is ok to progress to your normal diet.  A half-sandwich or bowl of soup is an example of a good first meal.  Heavy or fried foods are harder to digest and may make you feel nauseous or bloated.  Likewise meals heavy in dairy and vegetables can cause extra gas to form and this can also increase the bloating.  Drink plenty of fluids but you should avoid alcoholic beverages for 24 hours.  ACTIVITY: Your care partner should take you home directly after the procedure.  You should plan to take it easy, moving slowly for the rest of the day.  You can resume normal activity the day after the procedure however you should NOT DRIVE or use heavy machinery for 24 hours (because of the sedation medicines used during the test).    SYMPTOMS TO REPORT IMMEDIATELY: A gastroenterologist can be reached at any hour.  During normal business hours, 8:30 AM to 5:00 PM Monday through Friday,  call [phone].  After hours and on weekends, please call the GI answering service at [phone] who will take a message and have the physician on call contact you.   Following lower endoscopy (colonoscopy or flexible sigmoidoscopy):  Excessive amounts of blood in the stool  Significant tenderness or worsening of abdominal pains  Swelling of the abdomen that is new, acute  Fever of 100F or higher   FOLLOW UP: Our staff will call the home number listed on your records the next business day following your procedure to check on you and address any questions or concerns that you may have at that time regarding the information given to you following your procedure. This is a courtesy call and so if there is no answer at the home number and we have not heard from you through the emergency physician on call, we will assume that you have returned to your regular daily activities without incident.  SIGNATURES/CONFIDENTIALITY: You and/or your care partner have signed paperwork which will be entered into your electronic medical record.  These signatures attest to the fact that that the information above on your After Visit Summary has been reviewed and is understood.  Full responsibility of the confidentiality of this discharge information lies with you and/or your care-partner.  Continue your normal medications  Follow up colonoscopy in 10 years 

## 2012-11-24 NOTE — Progress Notes (Signed)
Pt stables to RR 

## 2012-11-27 ENCOUNTER — Telehealth: Payer: Self-pay | Admitting: *Deleted

## 2012-11-27 NOTE — Telephone Encounter (Signed)
  Follow up Call-  Call back number 11/24/2012  Post procedure Call Back phone  # [phone]  Permission to leave phone message Yes   [facility]

## 2013-09-24 ENCOUNTER — Encounter: Payer: Self-pay | Admitting: Internal Medicine

## 2013-09-24 DIAGNOSIS — G4733 Obstructive sleep apnea (adult) (pediatric): Secondary | ICD-10-CM | POA: Insufficient documentation

## 2013-09-24 DIAGNOSIS — E559 Vitamin D deficiency, unspecified: Secondary | ICD-10-CM | POA: Insufficient documentation

## 2013-09-24 DIAGNOSIS — E049 Nontoxic goiter, unspecified: Secondary | ICD-10-CM | POA: Insufficient documentation

## 2013-09-24 DIAGNOSIS — I10 Essential (primary) hypertension: Secondary | ICD-10-CM | POA: Insufficient documentation

## 2013-09-24 DIAGNOSIS — Z9109 Other allergy status, other than to drugs and biological substances: Secondary | ICD-10-CM | POA: Insufficient documentation

## 2013-09-24 DIAGNOSIS — E1169 Type 2 diabetes mellitus with other specified complication: Secondary | ICD-10-CM | POA: Insufficient documentation

## 2013-09-24 DIAGNOSIS — E669 Obesity, unspecified: Secondary | ICD-10-CM | POA: Insufficient documentation

## 2013-09-24 DIAGNOSIS — E119 Type 2 diabetes mellitus without complications: Secondary | ICD-10-CM | POA: Insufficient documentation

## 2013-09-26 ENCOUNTER — Encounter: Payer: Self-pay | Admitting: Physician Assistant

## 2013-10-18 ENCOUNTER — Encounter: Payer: Self-pay | Admitting: Physician Assistant

## 2013-11-10 ENCOUNTER — Other Ambulatory Visit: Payer: Self-pay | Admitting: Physician Assistant

## 2013-11-12 ENCOUNTER — Encounter: Payer: Self-pay | Admitting: Physician Assistant

## 2013-12-19 ENCOUNTER — Ambulatory Visit: Payer: Self-pay | Admitting: Internal Medicine

## 2013-12-24 ENCOUNTER — Encounter: Payer: Self-pay | Admitting: Physician Assistant

## 2013-12-24 ENCOUNTER — Ambulatory Visit (INDEPENDENT_AMBULATORY_CARE_PROVIDER_SITE_OTHER): Payer: 59 | Admitting: Physician Assistant

## 2013-12-24 DIAGNOSIS — Z1159 Encounter for screening for other viral diseases: Secondary | ICD-10-CM

## 2013-12-24 DIAGNOSIS — E669 Obesity, unspecified: Secondary | ICD-10-CM | POA: Insufficient documentation

## 2013-12-24 DIAGNOSIS — E119 Type 2 diabetes mellitus without complications: Secondary | ICD-10-CM

## 2013-12-24 DIAGNOSIS — Z0000 Encounter for general adult medical examination without abnormal findings: Secondary | ICD-10-CM

## 2013-12-24 DIAGNOSIS — M26609 Unspecified temporomandibular joint disorder, unspecified side: Secondary | ICD-10-CM

## 2013-12-24 DIAGNOSIS — I10 Essential (primary) hypertension: Secondary | ICD-10-CM

## 2013-12-24 HISTORY — DX: Morbid (severe) obesity due to excess calories: E66.01

## 2013-12-24 LAB — CBC WITH DIFFERENTIAL/PLATELET
BASOS ABS: 0.1 10*3/uL (ref 0.0–0.1)
Basophils Relative: 1 % (ref 0–1)
Eosinophils Absolute: 0.1 10*3/uL (ref 0.0–0.7)
Eosinophils Relative: 1 % (ref 0–5)
HEMATOCRIT: 39.4 % (ref 36.0–46.0)
Hemoglobin: 13 g/dL (ref 12.0–15.0)
LYMPHS PCT: 51 % — AB (ref 12–46)
Lymphs Abs: 3.2 10*3/uL (ref 0.7–4.0)
MCH: 25.4 pg — ABNORMAL LOW (ref 26.0–34.0)
MCHC: 33 g/dL (ref 30.0–36.0)
MCV: 77.1 fL — ABNORMAL LOW (ref 78.0–100.0)
MONO ABS: 0.4 10*3/uL (ref 0.1–1.0)
Monocytes Relative: 6 % (ref 3–12)
NEUTROS ABS: 2.5 10*3/uL (ref 1.7–7.7)
Neutrophils Relative %: 41 % — ABNORMAL LOW (ref 43–77)
PLATELETS: 496 10*3/uL — AB (ref 150–400)
RBC: 5.11 MIL/uL (ref 3.87–5.11)
RDW: 15.9 % — AB (ref 11.5–15.5)
WBC: 6.2 10*3/uL (ref 4.0–10.5)

## 2013-12-24 LAB — HEPATIC FUNCTION PANEL
ALK PHOS: 44 U/L (ref 39–117)
ALT: 43 U/L — ABNORMAL HIGH (ref 0–35)
AST: 26 U/L (ref 0–37)
Albumin: 4.4 g/dL (ref 3.5–5.2)
BILIRUBIN DIRECT: 0.1 mg/dL (ref 0.0–0.3)
BILIRUBIN INDIRECT: 0.3 mg/dL (ref 0.2–1.2)
BILIRUBIN TOTAL: 0.4 mg/dL (ref 0.2–1.2)
Total Protein: 7.3 g/dL (ref 6.0–8.3)

## 2013-12-24 LAB — BASIC METABOLIC PANEL WITH GFR
BUN: 13 mg/dL (ref 6–23)
CHLORIDE: 100 meq/L (ref 96–112)
CO2: 30 mEq/L (ref 19–32)
Calcium: 9.6 mg/dL (ref 8.4–10.5)
Creat: 0.49 mg/dL — ABNORMAL LOW (ref 0.50–1.10)
GFR, Est African American: >89 mL/min
Glucose, Bld: 93 mg/dL (ref 70–99)
POTASSIUM: 4.1 meq/L (ref 3.5–5.3)
Sodium: 139 mEq/L (ref 135–145)

## 2013-12-24 LAB — LIPID PANEL
CHOL/HDL RATIO: 2.3 ratio
Cholesterol: 160 mg/dL (ref 0–200)
HDL: 69 mg/dL (ref >39)
LDL CALC: 76 mg/dL (ref 0–99)
Triglycerides: 75 mg/dL (ref <150)
VLDL: 15 mg/dL (ref 0–40)

## 2013-12-24 LAB — HEMOGLOBIN A1C
HEMOGLOBIN A1C: 6.6 % — AB (ref <5.7)
Mean Plasma Glucose: 143 mg/dL — ABNORMAL HIGH (ref <117)

## 2013-12-24 LAB — IRON AND TIBC
%SAT: 13 % — ABNORMAL LOW (ref 20–55)
Iron: 50 ug/dL (ref 42–145)
TIBC: 374 ug/dL (ref 250–470)
UIBC: 324 ug/dL (ref 125–400)

## 2013-12-24 LAB — TSH: TSH: 0.497 u[IU]/mL (ref 0.350–4.500)

## 2013-12-24 LAB — FERRITIN: Ferritin: 32 ng/mL (ref 10–291)

## 2013-12-24 LAB — HEPATITIS C ANTIBODY: HCV AB: NEGATIVE

## 2013-12-24 LAB — HEPATITIS B CORE ANTIBODY, TOTAL: HEP B C TOTAL AB: NONREACTIVE

## 2013-12-24 LAB — HEPATITIS B SURFACE ANTIBODY,QUALITATIVE: HEP B S AB: NEGATIVE

## 2013-12-24 LAB — MAGNESIUM: Magnesium: 1.8 mg/dL (ref 1.5–2.5)

## 2013-12-24 LAB — VITAMIN B12: VITAMIN B 12: 474 pg/mL (ref 211–911)

## 2013-12-24 LAB — HEPATITIS A ANTIBODY, TOTAL: Hep A Total Ab: NONREACTIVE

## 2013-12-24 MED ORDER — CYCLOBENZAPRINE HCL 10 MG PO TABS: ORAL_TABLET | ORAL | Status: DC

## 2013-12-24 NOTE — Patient Instructions (Signed)
   Bad carbs also include fruit juice, alcohol, and sweet tea. These are empty calories that do not signal to your brain that you are full.   Please remember the good carbs are still carbs which convert into sugar. So please measure them out no more than 1/2-1 cup of rice, oatmeal, pasta, and beans.  Veggies are however free foods! Pile them on.   I like lean protein at every meal such as chicken, turkey, pork chops, cottage cheese, etc. Just do not fry these meats and please center your meal around vegetable, the meats should be a side dish.   No all fruit is created equal. Please see the list below, the fruit at the bottom is higher in sugars than the fruit at the top   What is the TMJ? The temporomandibular (tem-PUH-ro-man-DIB-yoo-ler) joint, or the TMJ, connects the upper and lower jawbones. This joint allows the jaw to open wide and move back and forth when you chew, talk, or yawn.There are also several muscles that help this joint move. There can be muscle tightness and pain in the muscle that can cause several symptoms.  What causes TMJ pain? There are many causes of TMJ pain. Repeated chewing (for example, chewing gum) and clenching your teeth can cause pain in the joint. Some TMJ pain has no obvious cause. What can I do to ease the pain? There are many things you can do to help your pain get better. When you have pain:  Eat soft foods and stay away from chewy foods (for example, taffy) Try to use both sides of your mouth to chew Don't chew gum Don't open your mouth wide (for example, during yawning or singing) Don't bite your cheeks or fingernails Lower your amount of stress and worry Applying a warm, damp washcloth to the joint may help. Over-the-counter pain medicines such as ibuprofen (one brand: Advil) or acetaminophen (one brand: Tylenol) might also help. Do not use these medicines if you are allergic to them or if your doctor told you not to use them. How can I stop the pain  from coming back? When your pain is better, you can do these exercises to make your muscles stronger and to keep the pain from coming back:  Resisted mouth opening: Place your thumb or two fingers under your chin and open your mouth slowly, pushing up lightly on your chin with your thumb. Hold for three to six seconds. Close your mouth slowly. Resisted mouth closing: Place your thumbs under your chin and your two index fingers on the ridge between your mouth and the bottom of your chin. Push down lightly on your chin as you close your mouth. Tongue up: Slowly open and close your mouth while keeping the tongue touching the roof of the mouth. Side-to-side jaw movement: Place an object about one fourth of an inch thick (for example, two tongue depressors) between your front teeth. Slowly move your jaw from side to side. Increase the thickness of the object as the exercise becomes easier Forward jaw movement: Place an object about one fourth of an inch thick between your front teeth and move the bottom jaw forward so that the bottom teeth are in front of the top teeth. Increase the thickness of the object as the exercise becomes easier. These exercises should not be painful. If it hurts to do these exercises, stop doing them and talk to your family doctor.    

## 2013-12-24 NOTE — Progress Notes (Signed)
Complete Physical HPI 53 y.o. female  presents for a complete physical. Her blood pressure has been controlled at home, today their BP is BP: 138/72 mmHg She denies chest pain, shortness of breath, dizziness.  Her cholesterol is diet controlled. Her cholesterol is at goal. The cholesterol last visit was: LDL 66  She has been working on diet and exercise for diabetes, and denies blurry vision, polydipsia, polyphagia and polyuria. Last A1C in the office was: A1C 6.4 Patient is on Vitamin D supplement. Vit 76, magnesium was 1.7.  She went to urgent care last Sunday for sinus congestion/pain, she was given Augmentin BID for 10 days and coricidin. She states the pressure is better but she continues to have lots of congestion come out, yellow with streaks of blood.  Current Medications:  Current Outpatient Prescriptions on File Prior to Visit  Medication Sig Dispense Refill  . aspirin EC 81 MG tablet Take 81 mg by mouth daily.        Marland Kitchen. BISOPROLOL-HYDROCHLOROTHIAZIDE PO Take by mouth daily.      . Cholecalciferol (VITAMIN D PO) Take 5,000 Int'l Units by mouth daily. Takes three times weekly      . cloNIDine (CATAPRES) 0.1 MG tablet Take 0.1 mg by mouth 3 (three) times daily.        . Dapagliflozin Propanediol (FARXIGA) 10 MG TABS Take 10 mg by mouth daily.      . furosemide (LASIX) 40 MG tablet Take 40 mg by mouth 2 (two) times daily.        Marland Kitchen. losartan (COZAAR) 100 MG tablet TAKE 1 TABLET BY MOUTH EVERY DAY  30 tablet  1  . metFORMIN (GLUCOPHAGE) 500 MG tablet Take 1,000 mg by mouth 2 (two) times daily with a meal.        . minoxidil (LONITEN) 10 MG tablet Take 10 mg by mouth daily. Takes 0.5 tablets in the a.m., 0.5 in the p.m.      . Multiple Vitamins-Minerals (MULTIVITAMIN PO) Take by mouth daily.       No current facility-administered medications on file prior to visit.   Health Maintenance:  Tetanus: 2010 Pneumovax: 2000 Flu vaccine:07/2013 Zostavax: N/A Pap: Dr. Chevis PrettyMezer 2014 MGM:  2014 DEXA: 05/ 2010- WNL Colonoscopy: 11/2012 Dr. Rhea BeltonPyrtle Due 10 years.  EGD: N/A  Allergies:  Allergies  Allergen Reactions  . Naproxen Hives  . Meloxicam & Diet Manage Prod Rash   Medical History:  Past Medical History  Diagnosis Date  . Sarcoidosis   . Hypertension   . Hyperlipidemia   . Diabetes mellitus   . Allergy   . OSA (obstructive sleep apnea)   . Goiter   . Sarcoid   . Vitamin D deficiency    Surgical History:  Past Surgical History  Procedure Laterality Date  . Eye surgery    . Achiles tendon      right  . Abdominal hysterectomy    . Tonsillectomy     Family History:  Family History  Problem Relation Age of Onset  . Colon cancer Neg Hx   . Esophageal cancer Neg Hx   . Rectal cancer Neg Hx   . Stomach cancer Neg Hx   . Heart disease Mother   . Diabetes Father   . Heart disease Father   . Diabetes Paternal Grandmother   . Heart disease Paternal Grandmother    Social History:  History  Substance Use Topics  . Smoking status: Never Smoker   . Smokeless tobacco: Never Used  .  Alcohol Use: No    Review of Systems: [X]  = complains of  [ ]  = denies  General: Fatigue [ ]  Fever [ ]  Chills [ ]  Weakness [ ]   Insomnia [ ]                 Weight change [ ]  Night sweats [ ]   Change in appetite [ ]  Eyes: Redness [ ]  Blurred vision [ ]  Diplopia [ ]  Discharge [ ]   ENT: Congestion Arly.Keller ] Sinus Pain Arly.Keller ] Post Nasal Drip [ ]  Sore Throat [ ]  Earache [ ]  hearing loss [ ]  Tinnitus [ ]  Snoring [ ]   Cardiac: Chest pain/pressure [ ]  SOB [ ]  Orthopnea [ ]   Palpitations [ ]   Paroxysmal nocturnal dyspnea[ ]  Claudication [ ]  Edema [ ]   Pulmonary: Cough [ ]  Wheezing[ ]   SOB [ ]   Pleurisy [ ]   GI: Nausea [ ]  Vomiting[ ]  Dysphagia[ ]  Heartburn[ ]  Abdominal pain [ ]  Constipation [ ] ; Diarrhea [ ]  BRBPR [ ]  Melena[ ]  Bloating [ ]  Hemorrhoids [ ]   GU: Hematuria[ ]  Dysuria [ ]  Nocturia[ ]  Urgency [ ]   Hesitancy [ ]  Discharge [ ]  Frequency [ ]   Breast:  Breast lumps [ ]   nipple  discharge [ ]    Neuro: Headaches[ ]  Vertigo[ ]  Paresthesias[ ]  Spasm [ ]  Speech changes [ ]  Incoordination [ ]   Ortho: Arthritis [ ]  Joint pain [ ]  Muscle pain [ ]  Joint swelling [ ]  Back Pain [ ]  Skin:  Rash [ ]   Pruritis [ ]  Change in skin lesion [ ]   Psych: Depression[ ]  Anxiety[ ]  Confusion [ ]  Memory loss [ ]   Heme/Lypmh: Bleeding [ ]  Bruising [ ]  Enlarged lymph nodes [ ]   Endocrine: Visual blurring [ ]  Paresthesia [ ]  Polyuria [ ]  Polydypsea [ ]    Heat/cold intolerance [ ]  Hypoglycemia [ ]   Physical Exam: Estimated body mass index is 38.95 kg/(m^2) as calculated from the following:   Height as of this encounter: 5\' 4"  (1.626 m).   Weight as of this encounter: 227 lb (102.967 kg). Filed Vitals:   12/24/13 1430  BP: 138/72  Temp: 98.5 F (36.9 C)  Resp: 16   General Appearance: Well nourished, in no apparent distress. Eyes: PERRLA, EOMs, conjunctiva no swelling or erythema, normal fundi and vessels. Sinuses: No Frontal/maxillary tenderness ENT/Mouth: Ext aud canals clear, normal light reflex with TMs without erythema, bulging.  Good dentition. No erythema, swelling, or exudate on post pharynx. Tonsils not swollen or erythematous. Hearing normal. + TMJ tenderness Neck: Supple, thyroid normal. No bruits Respiratory: Respiratory effort normal, BS equal bilaterally without rales, rhonchi, wheezing or stridor. Cardio: RRR without murmurs, rubs or gallops. Brisk peripheral pulses without edema.  Chest: symmetric, with normal excursions and percussion. Breasts:defer Abdomen: Soft, obese, +BS. Non tender, no guarding, rebound, hernias, masses, or organomegaly. .  Lymphatics: Non tender without lymphadenopathy.  Genitourinary: defer Musculoskeletal: Full ROM all peripheral extremities,5/5 strength, and normal gait. Skin: Warm, dry without rashes, lesions, ecchymosis.  Neuro: Cranial nerves intact, reflexes equal bilaterally. Normal muscle tone, no cerebellar symptoms. Sensation intact.   Psych: Awake and oriented X 3, normal affect, Insight and Judgment appropriate.   EKG: WNL no changes, PRWP, sinus brady.   Assessment and Plan: Sarcoidosis- no changes  Hypertension- at goal, continue meds  Hyperlipidemia- check lipids  Diabetes mellitus- Discussed general issues about diabetes pathophysiology and management., Educational material distributed., Suggested low cholesterol diet., Encouraged aerobic exercise., Discussed foot care.,  Reminded to get yearly retinal exam.  Allergy- cont OTC  OSA (obstructive sleep apnea)- get back on CPAP  Goiter- controlled  Vitamin D deficiency--controlled, continue the same medications  Obesity- continue weight loss, has lost 20 lbs since 2013.  TMJ/Sinusitis- check CBC, information given to the patient, no gum/decrease hard foods, warm wet wash clothes, decrease stress, talk with dentist about possible night guard, can do massage, and exercise. Flexeril 10 BID Health Maintenance  Discussed med's effects and SE's. Screening labs and tests as requested with regular follow-up as recommended.   Quentin Mulling 2:43 PM

## 2013-12-25 LAB — URINALYSIS, ROUTINE W REFLEX MICROSCOPIC
Bilirubin Urine: NEGATIVE
Glucose, UA: >1000 mg/dL — AB
HGB URINE DIPSTICK: NEGATIVE
Ketones, ur: NEGATIVE mg/dL
Nitrite: NEGATIVE
Protein, ur: NEGATIVE mg/dL
Specific Gravity, Urine: 1.02 (ref 1.005–1.030)
Urobilinogen, UA: 0.2 mg/dL (ref 0.0–1.0)
pH: 5 (ref 5.0–8.0)

## 2013-12-25 LAB — URINALYSIS, MICROSCOPIC ONLY
Bacteria, UA: NONE SEEN
Casts: NONE SEEN
Crystals: NONE SEEN

## 2013-12-25 LAB — VITAMIN D 25 HYDROXY (VIT D DEFICIENCY, FRACTURES): Vit D, 25-Hydroxy: 106 ng/mL — ABNORMAL HIGH (ref 30–89)

## 2013-12-25 LAB — MICROALBUMIN / CREATININE URINE RATIO
Creatinine, Urine: 55.3 mg/dL
MICROALB UR: 1.6 mg/dL (ref 0.00–1.89)
MICROALB/CREAT RATIO: 28.9 mg/g (ref 0.0–30.0)

## 2013-12-25 LAB — INSULIN, FASTING: Insulin fasting, serum: 5 u[IU]/mL (ref 3–28)

## 2013-12-28 LAB — HEPATITIS B E ANTIBODY: Hepatitis Be Antibody: NEGATIVE

## 2014-01-03 ENCOUNTER — Encounter: Payer: Self-pay | Admitting: Emergency Medicine

## 2014-01-03 ENCOUNTER — Ambulatory Visit (INDEPENDENT_AMBULATORY_CARE_PROVIDER_SITE_OTHER): Payer: 59 | Admitting: Emergency Medicine

## 2014-01-03 VITALS — BP 158/64 | HR 68 | Temp 98.0°F | Resp 18 | Ht 64.0 in | Wt 226.0 lb

## 2014-01-03 DIAGNOSIS — R300 Dysuria: Secondary | ICD-10-CM

## 2014-01-03 DIAGNOSIS — I10 Essential (primary) hypertension: Secondary | ICD-10-CM

## 2014-01-03 MED ORDER — CIPROFLOXACIN HCL 500 MG PO TABS: 500.0000 mg | ORAL_TABLET | Freq: Two times a day (BID) | ORAL | Status: AC

## 2014-01-03 MED ORDER — PHENAZOPYRIDINE HCL 200 MG PO TABS: 200.0000 mg | ORAL_TABLET | Freq: Three times a day (TID) | ORAL | Status: DC | PRN

## 2014-01-03 NOTE — Progress Notes (Signed)
Subjective:    Patient ID: Tanya [name], female    DOB: 1960/12/30, 53 y.o.   MRN: [id number]  HPI Comments: 53 yo female with dysuria/ frequency x several days. She denies d/c or itch vaginally. She denies fever/ hematuria/ back pain. She denies any recent changes or cause for UTI.  She notes BP has been good at home.   Current Outpatient Prescriptions on File Prior to Visit  Medication Sig Dispense Refill  . aspirin EC 81 MG tablet Take 81 mg by mouth daily.        Marland Kitchen BISOPROLOL-HYDROCHLOROTHIAZIDE PO Take by mouth daily.      . Cholecalciferol (VITAMIN D PO) Take 5,000 Int'l Units by mouth daily. Takes three times weekly      . cloNIDine (CATAPRES) 0.1 MG tablet Take 0.1 mg by mouth 3 (three) times daily.        . cyclobenzaprine (FLEXERIL) 10 MG tablet 1-2 at night for headache, sinus pain, jaw pain from TMJ  60 tablet  0  . Dapagliflozin Propanediol (FARXIGA) 10 MG TABS Take 10 mg by mouth daily.      . furosemide (LASIX) 40 MG tablet Take 40 mg by mouth 2 (two) times daily.        Marland Kitchen losartan (COZAAR) 100 MG tablet TAKE 1 TABLET BY MOUTH EVERY DAY  30 tablet  1  . metFORMIN (GLUCOPHAGE) 500 MG tablet Take 1,000 mg by mouth 2 (two) times daily with a meal.        . minoxidil (LONITEN) 10 MG tablet Take 10 mg by mouth daily. Takes 0.5 tablets in the a.m., 0.5 in the p.m.      . Multiple Vitamins-Minerals (MULTIVITAMIN PO) Take by mouth daily.       No current facility-administered medications on file prior to visit.   Allergies  Allergen Reactions  . Naproxen Hives  . Meloxicam & Diet Manage Prod Rash   Past Medical History  Diagnosis Date  . Sarcoidosis   . Hypertension   . Hyperlipidemia   . Diabetes mellitus   . Allergy   . OSA (obstructive sleep apnea)   . Goiter   . Sarcoid   . Vitamin D deficiency   . Morbid obesity 12/24/2013     Review of Systems  Genitourinary: Positive for dysuria and frequency.  All other systems reviewed and are negative.   BP 158/64   Pulse 68  Temp(Src) 98 F (36.7 C) (Temporal)  Resp 18  Ht 5\' 4"  (1.626 m)  Wt 226 lb (102.513 kg)  BMI 38.77 kg/m2     Objective:   Physical Exam  Nursing note and vitals reviewed. Constitutional: She is oriented to person, place, and time. She appears well-developed and well-nourished.  HENT:  Head: Normocephalic and atraumatic.  Eyes: Conjunctivae are normal.  Neck: Normal range of motion.  Cardiovascular: Normal rate, regular rhythm, normal heart sounds and intact distal pulses.   Pulmonary/Chest: Effort normal and breath sounds normal.  Abdominal: Soft. Bowel sounds are normal. She exhibits no distension and no mass. There is no tenderness. There is no rebound and no guarding.  Musculoskeletal: Normal range of motion.  Neurological: She is alert and oriented to person, place, and time.  Skin: Skin is warm and dry.  Psychiatric: She has a normal mood and affect. Judgment normal.          Assessment & Plan:  1.  UTI ? With dysuria/ freq- check labs, push H2O Hygiene explained, if labs neg  consider re-eval for yeast. Cipro AD. 2. HTN- Check BP call if >130/80, increase cardio

## 2014-01-03 NOTE — Patient Instructions (Signed)
Urinary Tract Infection °A urinary tract infection (UTI) can occur any place along the urinary tract. The tract includes the kidneys, ureters, bladder, and urethra. A type of germ called bacteria often causes a UTI. UTIs are often helped with antibiotic medicine.  °HOME CARE  °· If given, take antibiotics as told by your doctor. Finish them even if you start to feel better. °· Drink enough fluids to keep your pee (urine) clear or pale yellow. °· Avoid tea, drinks with caffeine, and bubbly (carbonated) drinks. °· Pee often. Avoid holding your pee in for a long time. °· Pee before and after having sex (intercourse). °· Wipe from front to back after you poop (bowel movement) if you are a woman. Use each tissue only once. °GET HELP RIGHT AWAY IF:  °· You have back pain. °· You have lower belly (abdominal) pain. °· You have chills. °· You feel sick to your stomach (nauseous). °· You throw up (vomit). °· Your burning or discomfort with peeing does not go away. °· You have a fever. °· Your symptoms are not better in 3 days. °MAKE SURE YOU:  °· Understand these instructions. °· Will watch your condition. °· Will get help right away if you are not doing well or get worse. °Document Released: 04/05/2008 Document Revised: 07/12/2012 Document Reviewed: 05/18/2012 °ExitCare® Patient Information ©2014 [employer]. ° °

## 2014-01-04 ENCOUNTER — Ambulatory Visit: Payer: Self-pay | Admitting: Physician Assistant

## 2014-01-04 LAB — URINALYSIS, ROUTINE W REFLEX MICROSCOPIC
Bilirubin Urine: NEGATIVE
Glucose, UA: >1000 mg/dL — AB
Hgb urine dipstick: NEGATIVE
Ketones, ur: NEGATIVE mg/dL
Leukocytes, UA: NEGATIVE
NITRITE: NEGATIVE
Protein, ur: NEGATIVE mg/dL
SPECIFIC GRAVITY, URINE: 1.014 (ref 1.005–1.030)
UROBILINOGEN UA: 0.2 mg/dL (ref 0.0–1.0)
pH: 6 (ref 5.0–8.0)

## 2014-01-04 LAB — URINALYSIS, MICROSCOPIC ONLY
Bacteria, UA: NONE SEEN
CASTS: NONE SEEN
CRYSTALS: NONE SEEN
Squamous Epithelial / LPF: NONE SEEN

## 2014-01-05 ENCOUNTER — Other Ambulatory Visit: Payer: Self-pay | Admitting: Emergency Medicine

## 2014-01-05 LAB — URINE CULTURE

## 2014-01-05 MED ORDER — AMOXICILLIN 500 MG PO CAPS: 500.0000 mg | ORAL_CAPSULE | Freq: Three times a day (TID) | ORAL | Status: DC

## 2014-01-11 ENCOUNTER — Other Ambulatory Visit: Payer: Self-pay | Admitting: Emergency Medicine

## 2014-01-14 ENCOUNTER — Other Ambulatory Visit: Payer: Self-pay | Admitting: Emergency Medicine

## 2014-01-14 MED ORDER — LOSARTAN POTASSIUM 100 MG PO TABS: ORAL_TABLET | ORAL | Status: DC

## 2014-01-20 ENCOUNTER — Other Ambulatory Visit: Payer: Self-pay | Admitting: Physician Assistant

## 2014-02-06 ENCOUNTER — Encounter: Payer: Self-pay | Admitting: Emergency Medicine

## 2014-02-06 ENCOUNTER — Ambulatory Visit (INDEPENDENT_AMBULATORY_CARE_PROVIDER_SITE_OTHER): Payer: 59 | Admitting: Emergency Medicine

## 2014-02-06 VITALS — BP 132/78 | HR 80 | Temp 98.6°F | Resp 18 | Ht 64.0 in | Wt 228.0 lb

## 2014-02-06 DIAGNOSIS — Z79899 Other long term (current) drug therapy: Secondary | ICD-10-CM

## 2014-02-06 DIAGNOSIS — N390 Urinary tract infection, site not specified: Secondary | ICD-10-CM

## 2014-02-06 LAB — CBC WITH DIFFERENTIAL/PLATELET
BASOS ABS: 0 10*3/uL (ref 0.0–0.1)
BASOS PCT: 0 % (ref 0–1)
Eosinophils Absolute: 0.1 10*3/uL (ref 0.0–0.7)
Eosinophils Relative: 2 % (ref 0–5)
HEMATOCRIT: 37.9 % (ref 36.0–46.0)
Hemoglobin: 12.2 g/dL (ref 12.0–15.0)
LYMPHS PCT: 53 % — AB (ref 12–46)
Lymphs Abs: 3.2 10*3/uL (ref 0.7–4.0)
MCH: 25.5 pg — ABNORMAL LOW (ref 26.0–34.0)
MCHC: 32.2 g/dL (ref 30.0–36.0)
MCV: 79.3 fL (ref 78.0–100.0)
MONO ABS: 0.6 10*3/uL (ref 0.1–1.0)
Monocytes Relative: 10 % (ref 3–12)
Neutro Abs: 2.1 10*3/uL (ref 1.7–7.7)
Neutrophils Relative %: 35 % — ABNORMAL LOW (ref 43–77)
PLATELETS: 365 10*3/uL (ref 150–400)
RBC: 4.78 MIL/uL (ref 3.87–5.11)
RDW: 15.5 % (ref 11.5–15.5)
WBC: 6.1 10*3/uL (ref 4.0–10.5)

## 2014-02-06 NOTE — Progress Notes (Signed)
   Subjective:    Patient ID: Tanya [name], female    DOB: 04/01/1961, 53 y.o.   MRN: [id number]  HPI Comments: 53 yo female has been on [place] for diabetes and noticed increased bladder pressure.  She had recent UTI treated with Amox with resolution of symptoms and when restarted Farxiga symptoms returned. Has been off Farxiga x 2 weeks and Blood sugars has been around 125. She has been trying to improve diet, weight loss, and exercise. She has d/c all soda.   Current Outpatient Prescriptions on File Prior to Visit  Medication Sig Dispense Refill  . aspirin EC 81 MG tablet Take 81 mg by mouth daily.        Marland Kitchen. BISOPROLOL-HYDROCHLOROTHIAZIDE PO Take by mouth daily.      . Cholecalciferol (VITAMIN D PO) Take 5,000 Int'l Units by mouth daily. Takes three times weekly      . cloNIDine (CATAPRES) 0.1 MG tablet Take 0.1 mg by mouth 3 (three) times daily.        . furosemide (LASIX) 40 MG tablet Take 40 mg by mouth 2 (two) times daily.        Marland Kitchen. losartan (COZAAR) 100 MG tablet TAKE 1 TABLET BY MOUTH EVERY DAY  90 tablet  0  . metFORMIN (GLUCOPHAGE) 500 MG tablet Take 1,000 mg by mouth 2 (two) times daily with a meal.        . Multiple Vitamins-Minerals (MULTIVITAMIN PO) Take by mouth daily.       No current facility-administered medications on file prior to visit.   Allergies  Allergen Reactions  . Naproxen Hives  . Meloxicam & Diet Manage Prod Rash   Past Medical History  Diagnosis Date  . Sarcoidosis   . Hypertension   . Hyperlipidemia   . Diabetes mellitus   . Allergy   . OSA (obstructive sleep apnea)   . Goiter   . Sarcoid   . Vitamin D deficiency   . Morbid obesity 12/24/2013     Review of Systems  Genitourinary: Positive for difficulty urinating.  All other systems reviewed and are negative.  BP 132/78  Pulse 80  Temp(Src) 98.6 F (37 C) (Temporal)  Resp 18  Ht 5\' 4"  (1.626 m)  Wt 228 lb (103.42 kg)  BMI 39.12 kg/m2     Objective:   Physical Exam  Nursing note and  vitals reviewed. Constitutional: She is oriented to person, place, and time. She appears well-developed and well-nourished.  HENT:  Head: Normocephalic and atraumatic.  Eyes: Conjunctivae are normal.  Neck: Normal range of motion.  Cardiovascular: Normal rate, regular rhythm, normal heart sounds and intact distal pulses.   Pulmonary/Chest: Effort normal and breath sounds normal.  Abdominal: Soft. Bowel sounds are normal. She exhibits no distension and no mass. There is no tenderness. There is no rebound and no guarding.  Musculoskeletal: Normal range of motion.  Neurological: She is alert and oriented to person, place, and time.  Skin: Skin is warm and dry.  Psychiatric: She has a normal mood and affect. Judgment normal.          Assessment & Plan:  1.  UTI recent- Recheck labs, push H2O Hygiene explained D/C Farxiga trial of Invokana 100 mg AD SX given #21 w/c with results.  2. DM- Check lab and trial of Invokana.

## 2014-02-06 NOTE — Patient Instructions (Signed)
Urinary Tract Infection °A urinary tract infection (UTI) can occur any place along the urinary tract. The tract includes the kidneys, ureters, bladder, and urethra. A type of germ called bacteria often causes a UTI. UTIs are often helped with antibiotic medicine.  °HOME CARE  °· If given, take antibiotics as told by your doctor. Finish them even if you start to feel better. °· Drink enough fluids to keep your pee (urine) clear or pale yellow. °· Avoid tea, drinks with caffeine, and bubbly (carbonated) drinks. °· Pee often. Avoid holding your pee in for a long time. °· Pee before and after having sex (intercourse). °· Wipe from front to back after you poop (bowel movement) if you are a woman. Use each tissue only once. °GET HELP RIGHT AWAY IF:  °· You have back pain. °· You have lower belly (abdominal) pain. °· You have chills. °· You feel sick to your stomach (nauseous). °· You throw up (vomit). °· Your burning or discomfort with peeing does not go away. °· You have a fever. °· Your symptoms are not better in 3 days. °MAKE SURE YOU:  °· Understand these instructions. °· Will watch your condition. °· Will get help right away if you are not doing well or get worse. °Document Released: 04/05/2008 Document Revised: 07/12/2012 Document Reviewed: 05/18/2012 °ExitCare® Patient Information ©2014 [employer]. ° °

## 2014-02-07 LAB — URINALYSIS, ROUTINE W REFLEX MICROSCOPIC
BILIRUBIN URINE: NEGATIVE
Glucose, UA: NEGATIVE mg/dL
Hgb urine dipstick: NEGATIVE
Ketones, ur: NEGATIVE mg/dL
NITRITE: NEGATIVE
PROTEIN: NEGATIVE mg/dL
SPECIFIC GRAVITY, URINE: 1.019 (ref 1.005–1.030)
UROBILINOGEN UA: 0.2 mg/dL (ref 0.0–1.0)
pH: 6 (ref 5.0–8.0)

## 2014-02-07 LAB — URINALYSIS, MICROSCOPIC ONLY: Casts: NONE SEEN

## 2014-02-07 LAB — BASIC METABOLIC PANEL WITH GFR
BUN: 14 mg/dL (ref 6–23)
CALCIUM: 9.5 mg/dL (ref 8.4–10.5)
CHLORIDE: 99 meq/L (ref 96–112)
CO2: 32 mEq/L (ref 19–32)
CREATININE: 0.58 mg/dL (ref 0.50–1.10)
GFR, Est African American: >89 mL/min
Glucose, Bld: 90 mg/dL (ref 70–99)
Potassium: 3.7 mEq/L (ref 3.5–5.3)
Sodium: 139 mEq/L (ref 135–145)

## 2014-02-08 LAB — URINE CULTURE

## 2014-02-12 ENCOUNTER — Other Ambulatory Visit: Payer: Self-pay | Admitting: Emergency Medicine

## 2014-02-12 ENCOUNTER — Encounter: Payer: Self-pay | Admitting: Internal Medicine

## 2014-02-12 MED ORDER — AMOXICILLIN-POT CLAVULANATE 875-125 MG PO TABS: 1.0000 | ORAL_TABLET | Freq: Two times a day (BID) | ORAL | Status: AC

## 2014-03-01 ENCOUNTER — Other Ambulatory Visit: Payer: Self-pay | Admitting: Internal Medicine

## 2014-03-12 ENCOUNTER — Ambulatory Visit: Payer: Self-pay

## 2014-03-13 ENCOUNTER — Ambulatory Visit (INDEPENDENT_AMBULATORY_CARE_PROVIDER_SITE_OTHER): Payer: 59 | Admitting: Physician Assistant

## 2014-03-13 ENCOUNTER — Encounter: Payer: Self-pay | Admitting: Physician Assistant

## 2014-03-13 VITALS — BP 128/78 | HR 60 | Temp 98.1°F | Resp 16 | Wt 230.0 lb

## 2014-03-13 DIAGNOSIS — M545 Low back pain, unspecified: Secondary | ICD-10-CM

## 2014-03-13 DIAGNOSIS — N390 Urinary tract infection, site not specified: Secondary | ICD-10-CM

## 2014-03-13 LAB — CBC WITH DIFFERENTIAL/PLATELET
Basophils Absolute: 0 10*3/uL (ref 0.0–0.1)
Basophils Relative: 0 % (ref 0–1)
Eosinophils Absolute: 0.1 10*3/uL (ref 0.0–0.7)
Eosinophils Relative: 2 % (ref 0–5)
HEMATOCRIT: 36.7 % (ref 36.0–46.0)
HEMOGLOBIN: 12.1 g/dL (ref 12.0–15.0)
Lymphocytes Relative: 50 % — ABNORMAL HIGH (ref 12–46)
Lymphs Abs: 2.6 10*3/uL (ref 0.7–4.0)
MCH: 25.6 pg — ABNORMAL LOW (ref 26.0–34.0)
MCHC: 33 g/dL (ref 30.0–36.0)
MCV: 77.6 fL — AB (ref 78.0–100.0)
Monocytes Absolute: 0.4 10*3/uL (ref 0.1–1.0)
Monocytes Relative: 7 % (ref 3–12)
NEUTROS ABS: 2.1 10*3/uL (ref 1.7–7.7)
Neutrophils Relative %: 41 % — ABNORMAL LOW (ref 43–77)
Platelets: 390 10*3/uL (ref 150–400)
RBC: 4.73 MIL/uL (ref 3.87–5.11)
RDW: 16.2 % — ABNORMAL HIGH (ref 11.5–15.5)
WBC: 5.1 10*3/uL (ref 4.0–10.5)

## 2014-03-13 LAB — URINALYSIS, ROUTINE W REFLEX MICROSCOPIC
Bilirubin Urine: NEGATIVE
GLUCOSE, UA: NEGATIVE mg/dL
HGB URINE DIPSTICK: NEGATIVE
Ketones, ur: NEGATIVE mg/dL
NITRITE: NEGATIVE
PH: 6.5 (ref 5.0–8.0)
Protein, ur: NEGATIVE mg/dL
Specific Gravity, Urine: 1.007 (ref 1.005–1.030)
Urobilinogen, UA: 0.2 mg/dL (ref 0.0–1.0)

## 2014-03-13 LAB — BASIC METABOLIC PANEL WITH GFR
BUN: 12 mg/dL (ref 6–23)
CALCIUM: 9.4 mg/dL (ref 8.4–10.5)
CHLORIDE: 100 meq/L (ref 96–112)
CO2: 31 mEq/L (ref 19–32)
Creat: 0.5 mg/dL (ref 0.50–1.10)
GFR, Est Non African American: >89 mL/min
Glucose, Bld: 101 mg/dL — ABNORMAL HIGH (ref 70–99)
POTASSIUM: 3.8 meq/L (ref 3.5–5.3)
Sodium: 138 mEq/L (ref 135–145)

## 2014-03-13 LAB — HEPATIC FUNCTION PANEL
ALBUMIN: 4 g/dL (ref 3.5–5.2)
ALT: 12 U/L (ref 0–35)
AST: 13 U/L (ref 0–37)
Alkaline Phosphatase: 51 U/L (ref 39–117)
BILIRUBIN TOTAL: 0.3 mg/dL (ref 0.2–1.2)
Bilirubin, Direct: 0.1 mg/dL (ref 0.0–0.3)
Indirect Bilirubin: 0.2 mg/dL (ref 0.2–1.2)
Total Protein: 6.6 g/dL (ref 6.0–8.3)

## 2014-03-13 NOTE — Patient Instructions (Signed)
Back Exercises °Back exercises help treat and prevent back injuries. The goal of back exercises is to increase the strength of your abdominal and back muscles and the flexibility of your back. These exercises should be started when you no longer have back pain. Back exercises include: °· Pelvic Tilt. Lie on your back with your knees bent. Tilt your pelvis until the lower part of your back is against the floor. Hold this position 5 to 10 sec and repeat 5 to 10 times. °· Knee to Chest. Pull first 1 knee up against your chest and hold for 20 to 30 seconds, repeat this with the other knee, and then both knees. This may be done with the other leg straight or bent, whichever feels better. °· Sit-Ups or Curl-Ups. Bend your knees 90 degrees. Start with tilting your pelvis, and do a partial, slow sit-up, lifting your trunk only 30 to 45 degrees off the floor. Take at least 2 to 3 seconds for each sit-up. Do not do sit-ups with your knees out straight. If partial sit-ups are difficult, simply do the above but with only tightening your abdominal muscles and holding it as directed. °· Hip-Lift. Lie on your back with your knees flexed 90 degrees. Push down with your feet and shoulders as you raise your hips a couple inches off the floor; hold for 10 seconds, repeat 5 to 10 times. °· Back arches. Lie on your stomach, propping yourself up on bent elbows. Slowly press on your hands, causing an arch in your low back. Repeat 3 to 5 times. Any initial stiffness and discomfort should lessen with repetition over time. °· Shoulder-Lifts. Lie face down with arms beside your body. Keep hips and torso pressed to floor as you slowly lift your head and shoulders off the floor. °Do not overdo your exercises, especially in the beginning. Exercises may cause you some mild back discomfort which lasts for a few minutes; however, if the pain is more severe, or lasts for more than 15 minutes, do not continue exercises until you see your caregiver.  Improvement with exercise therapy for back problems is slow.  °See your caregivers for assistance with developing a proper back exercise program. °Document Released: 11/25/2004 Document Revised: 01/10/2012 Document Reviewed: 08/19/2011 °ExitCare® Patient Information ©2014 [employer]. ° °Back Pain, Adult °Low back pain is very common. About 1 in 5 people have back pain. The cause of low back pain is rarely dangerous. The pain often gets better over time. About half of people with a sudden onset of back pain feel better in just 2 weeks. About 8 in 10 people feel better by 6 weeks.  °CAUSES °Some common causes of back pain include: °· Strain of the muscles or ligaments supporting the spine. °· Wear and tear (degeneration) of the spinal discs. °· Arthritis. °· Direct injury to the back. °DIAGNOSIS °Most of the time, the direct cause of low back pain is not known. However, back pain can be treated effectively even when the exact cause of the pain is unknown. Answering your caregiver's questions about your overall health and symptoms is one of the most accurate ways to make sure the cause of your pain is not dangerous. If your caregiver needs more information, he or she may order lab work or imaging tests (X-rays or MRIs). However, even if imaging tests show changes in your back, this usually does not require surgery. °HOME CARE INSTRUCTIONS °For many people, back pain returns. Since low back pain is rarely dangerous, it is often a condition that people   can learn to manage on their own.  °· Remain active. It is stressful on the back to sit or stand in one place. Do not sit, drive, or stand in one place for more than 30 minutes at a time. Take short walks on level surfaces as soon as pain allows. Try to increase the length of time you walk each day. °· Do not stay in bed. Resting more than 1 or 2 days can delay your recovery. °· Do not avoid exercise or work. Your body is made to move. It is not dangerous to be active,  even though your back may hurt. Your back will likely heal faster if you return to being active before your pain is gone. °· Pay attention to your body when you  bend and lift. Many people have less discomfort when lifting if they bend their knees, keep the load close to their bodies, and avoid twisting. Often, the most comfortable positions are those that put less stress on your recovering back. °· Find a comfortable position to sleep. Use a firm mattress and lie on your side with your knees slightly bent. If you lie on your back, put a pillow under your knees. °· Only take over-the-counter or prescription medicines as directed by your caregiver. Over-the-counter medicines to reduce pain and inflammation are often the most helpful. Your caregiver may prescribe muscle relaxant drugs. These medicines help dull your pain so you can more quickly return to your normal activities and healthy exercise. °· Put ice on the injured area. °· Put ice in a plastic bag. °· Place a towel between your skin and the bag. °· Leave the ice on for 15-20 minutes, 03-04 times a day for the first 2 to 3 days. After that, ice and heat may be alternated to reduce pain and spasms. °· Ask your caregiver about trying back exercises and gentle massage. This may be of some benefit. °· Avoid feeling anxious or stressed. Stress increases muscle tension and can worsen back pain. It is important to recognize when you are anxious or stressed and learn ways to manage it. Exercise is a great option. °SEEK MEDICAL CARE IF: °· You have pain that is not relieved with rest or medicine. °· You have pain that does not improve in 1 week. °· You have new symptoms. °· You are generally not feeling well. °SEEK IMMEDIATE MEDICAL CARE IF:  °· You have pain that radiates from your back into your legs. °· You develop new bowel or bladder control problems. °· You have unusual weakness or numbness in your arms or legs. °· You develop nausea or vomiting. °· You develop  abdominal pain. °· You feel faint. °Document Released: 10/18/2005 Document Revised: 04/18/2012 Document Reviewed: 03/08/2011 °ExitCare® Patient Information ©2014 [employer]. ° °

## 2014-03-13 NOTE — Progress Notes (Signed)
   Subjective:    Patient ID: Tanya [name], female    DOB: 08/28/1961, 53 y.o.   MRN: [id number]  HPI 53 y.o. AAF presents with right lower back pain and lab follow up. She had a + UTI last visit and was treated with an ABX. She states that she is not having any UA symptoms at this time. She has frequency but states she drinks a lot and some of her meds cause frequency but no dysuria, urgency, hematuria, occ funny color.   She has been having right lower back pain for 3 weeks. No injury. Denies pain down her legs, numbness/tingling in her feet. Constant lower back ache, nothing makes it worse. She has been walking 3 miles a day.   Review of Systems  Constitutional: Negative.   HENT: Negative.   Respiratory: Negative.   Cardiovascular: Negative.   Gastrointestinal: Negative.   Genitourinary: Positive for frequency. Negative for dysuria, urgency, flank pain, decreased urine volume, vaginal bleeding, vaginal discharge, enuresis, genital sores, vaginal pain, menstrual problem and pelvic pain.  Musculoskeletal: Positive for back pain. Negative for arthralgias and gait problem.  Skin: Negative.  Negative for rash.  Neurological: Negative.  Negative for weakness and numbness.  Psychiatric/Behavioral: Negative.        Objective:   Physical Exam  Constitutional: She is oriented to person, place, and time. She appears well-developed and well-nourished.  HENT:  Head: Normocephalic and atraumatic.  Cardiovascular: Normal rate and regular rhythm.   Pulmonary/Chest: Effort normal and breath sounds normal.  Abdominal: Soft. Bowel sounds are normal.  Musculoskeletal: She exhibits tenderness.  Patient is able to ambulate well.  Gait is not antalgic. Straight leg raising negative bilaterally for radicular symptoms. Sensory exam in the legs is normal.  Knee reflexes are normal and symmetric. Ankle reflexes are normal and symmetric Strength is normal and symmetric. There isparaspinal muscle spasm.   There is not no midline tenderness.  ROM of spine with normal flexion, extension, lateral range of motion to the right and left. Some pain with rotation to the right and left.    Neurological: She is alert and oriented to person, place, and time. She displays normal reflexes. Coordination normal.  Skin: Skin is warm and dry.      Assessment & Plan:  UTI- check labs Right back pain- negative straight leg, no bowel/bladder problems- likely due to increased walking  Mobic, RICE, and exercise given If not better with refer to orthopedics or do injection

## 2014-03-14 ENCOUNTER — Ambulatory Visit: Payer: Self-pay | Admitting: Physician Assistant

## 2014-03-14 LAB — URINALYSIS, MICROSCOPIC ONLY
Bacteria, UA: NONE SEEN
CRYSTALS: NONE SEEN
Casts: NONE SEEN
Squamous Epithelial / LPF: NONE SEEN

## 2014-03-15 LAB — URINE CULTURE
COLONY COUNT: NO GROWTH
Organism ID, Bacteria: NO GROWTH

## 2014-03-20 ENCOUNTER — Encounter: Payer: Self-pay | Admitting: Emergency Medicine

## 2014-03-20 ENCOUNTER — Ambulatory Visit (INDEPENDENT_AMBULATORY_CARE_PROVIDER_SITE_OTHER): Payer: 59 | Admitting: Emergency Medicine

## 2014-03-20 ENCOUNTER — Ambulatory Visit (HOSPITAL_COMMUNITY)
Admission: RE | Admit: 2014-03-20 | Discharge: 2014-03-20 | Disposition: A | Discharge status: Home / Self Care | Payer: 59 | Source: Ambulatory Visit | Attending: Internal Medicine | Admitting: Internal Medicine

## 2014-03-20 VITALS — BP 162/80 | HR 62 | Temp 98.2°F | Resp 18 | Ht 64.0 in | Wt 220.0 lb

## 2014-03-20 DIAGNOSIS — M79609 Pain in unspecified limb: Secondary | ICD-10-CM | POA: Insufficient documentation

## 2014-03-20 DIAGNOSIS — M25569 Pain in unspecified knee: Secondary | ICD-10-CM

## 2014-03-20 MED ORDER — DOXYCYCLINE HYCLATE 100 MG PO TABS: 100.0000 mg | ORAL_TABLET | Freq: Two times a day (BID) | ORAL | Status: DC

## 2014-03-20 MED ORDER — PREDNISONE 10 MG PO TABS: ORAL_TABLET | ORAL | Status: DC

## 2014-03-20 NOTE — Patient Instructions (Signed)
Phlebitis Phlebitis is soreness and puffiness (swelling) in a vein.  HOME CARE  Only take medicine as told by your doctor.  Raise (elevate) the affected limb on a pillow as told by your doctor.  Keep a warm packs on the affected vein as told by your doctor. Do not sleep with a heating pad.  Use special stockings or bandages around the area of the affected vein as told by your doctor. These will speed healing and keep the condition from coming back.  Talk to your doctor about all the medicines you take.  Get follow-up blood tests as told by your doctor.  If the phlebitis is in your legs:  Avoid standing or resting for long periods.  Keep your legs moving. Raise your legs when you sit or lie.  Do not smoke.  Follow-up with your doctor as told. GET HELP IF:  You have strange bruises or bleeding.  Your puffiness or pain in the affected area is not getting better.  You are taking special medicine to lessen puffiness (anti-inflammatory medicine) , and you get belly pain. GET HELP RIGHT AWAY IF:   The phlebitis gets worse and you have more pain, puffiness (swelling), or redness.  You have trouble breathing or have chest pain.  You have a fever. MAKE SURE YOU:   Understand these instructions.  Will watch your condition.  Will get help right away if you are not doing well or get worse. Document Released: 10/06/2009 Document Revised: 08/08/2013 Document Reviewed: 06/25/2013 [facility] Patient Information 2014 [place], [place]. Deep Vein Thrombosis A deep vein thrombosis (DVT) is a blood clot that develops in the deep, larger veins of the leg, arm, or pelvis. These are more dangerous than clots that might form in veins near the surface of the body. A DVT can lead to complications if the clot breaks off and travels in the bloodstream to the lungs.  A DVT can damage the valves in your leg veins, so that instead of flowing upward, the blood pools in the lower leg. This is called  post-thrombotic syndrome, and it can result in pain, swelling, discoloration, and sores on the leg. CAUSES Usually, several things contribute to blood clots forming. Contributing factors include:  The flow of blood slows down.  The inside of the vein is damaged in some way.  You have a condition that makes blood clot more easily. RISK FACTORS Some people are more likely than others to develop blood clots. Risk factors include:   Older age, especially over 6 years of age.  Having a family history of blood clots or if you have already had a blot clot.  Having major or lengthy surgery. This is especially true for surgery on the hip, knee, or belly (abdomen). Hip surgery is particularly high risk.  Breaking a hip or leg.  Sitting or lying still for a long time. This includes long-distance travel, paralysis, or recovery from an illness or surgery.  Having cancer or cancer treatment.  Having a long, thin tube (catheter) placed inside a vein during a medical procedure.  Being overweight (obese).  Pregnancy and childbirth.  Hormone changes make the blood clot more easily during pregnancy.  The fetus puts pressure on the veins of the pelvis.  There is a risk of injury to veins during delivery or a caesarean. The risk is highest just after childbirth.  Medicines with the female hormone estrogen. This includes birth control pills and hormone replacement therapy.  Smoking.  Other circulation or heart problems.  SIGNS AND SYMPTOMS When a clot forms, it can either partially or totally block the blood flow in that vein. Symptoms of a DVT can include:  Swelling of the leg or arm, especially if one side is much worse.  Warmth and redness of the leg or arm, especially if one side is much worse.  Pain in an arm or leg. If the clot is in the leg, symptoms may be more noticeable or worse when standing or walking. The symptoms of a DVT that has traveled to the lungs (pulmonary embolism,  PE) usually start suddenly and include:  Shortness of breath.  Coughing.  Coughing up blood or blood-tinged phlegm.  Chest pain. The chest pain is often worse with deep breaths.  Rapid heartbeat. Anyone with these symptoms should get emergency medical treatment right away. Call your local emergency services (911 in the [place] if you have these symptoms. DIAGNOSIS If a DVT is suspected, your health care provider will take a full medical history and perform a physical exam. Tests that also may be required include:  Blood tests, including studies of the clotting properties of the blood.  Ultrasonography to see if you have clots in your legs or lungs.  X-rays to show the flow of blood when dye is injected into the veins (venography).  Studies of your lungs if you have any chest symptoms. PREVENTION  Exercise the legs regularly. Take a brisk 30-minute walk every day.  Maintain a weight that is appropriate for your height.  Avoid sitting or lying in bed for long periods of time without moving your legs.  Women, particularly those over the age of 35 years, should consider the risks and benefits of taking estrogen medicines, including birth control pills.  Do not smoke, especially if you take estrogen medicines.  Long-distance travel can increase your risk of DVT. You should exercise your legs by walking or pumping the muscles every hour.  In-hospital prevention:  Many of the risk factors above relate to situations that exist with hospitalization, either for illness, injury, or elective surgery.  Your health care provider will assess you for the need for venous thromboembolism prophylaxis when you are admitted to the hospital. If you are having surgery, your surgeon will assess you the day of or day after surgery.  Prevention may include medical and nonmedical measures. TREATMENT Once identified, a DVT can be treated. It can also be prevented in some circumstances. Once you have  had a DVT, you may be at increased risk for a DVT in the future. The most common treatment for DVT is blood thinning (anticoagulant) medicine, which reduces the blood's tendency to clot. Anticoagulants can stop new blood clots from forming and stop old ones from growing. They cannot dissolve existing clots. Your body does this by itself over time. Anticoagulants can be given by mouth, by IV access, or by injection. Your health care provider will determine the best program for you. Other medicines or treatments that may be used are:  Heparin or related medicines (low molecular weight heparin) are usually the first treatment for a blood clot. They act quickly. However, they cannot be taken orally.  Heparin can cause a fall in a component of blood that stops bleeding and forms blood clots (platelets). You will be monitored with blood tests to be sure this does not occur.  Warfarin is an anticoagulant that can be swallowed. It takes a few days to start working, so usually heparin or related medicines are used in combination. Once  warfarin is working, heparin is usually stopped.  Less commonly, clot dissolving drugs (thrombolytics) are used to dissolve a DVT. They carry a high risk of bleeding, so they are used mainly in severe cases, where your life or a limb is threatened.  Very rarely, a blood clot in the leg needs to be removed surgically.  If you are unable to take anticoagulants, your health care provider may arrange for you to have a filter placed in a main vein in your abdomen. This filter prevents clots from traveling to your lungs. HOME CARE INSTRUCTIONS  Take all medicines prescribed by your health care provider. Only take over-the-counter or prescription medicines for pain, fever, or discomfort as directed by your health care provider.  Warfarin. Most people will continue taking warfarin after hospital discharge. Your health care provider will advise you on the length of treatment (usually 3  6 months, sometimes lifelong).  Too much and too little warfarin are both dangerous. Too much warfarin increases the risk of bleeding. Too little warfarin continues to allow the risk for blood clots. While taking warfarin, you will need to have regular blood tests to measure your blood clotting time. These blood tests usually include both the prothrombin time (PT) and international normalized ratio (INR) tests. The PT and INR results allow your health care provider to adjust your dose of warfarin. The dose can change for many reasons. It is critically important that you take warfarin exactly as prescribed, and that you have your PT and INR levels drawn exactly as directed.  Many foods, especially foods high in vitamin K, can interfere with warfarin and affect the PT and INR results. Foods high in vitamin K include spinach, kale, broccoli, cabbage, collard and turnip greens, brussel sprouts, peas, cauliflower, seaweed, and parsley as well as beef and pork liver, green tea, and soybean oil. You should eat a consistent amount of foods high in vitamin K. Avoid major changes in your diet, or notify your health care provider before changing your diet. Arrange a visit with a dietitian to answer your questions.  Many medicines can interfere with warfarin and affect the PT and INR results. You must tell your health care provider about any and all medicines you take. This includes all vitamins and supplements. Be especially cautious with aspirin and anti-inflammatory medicines. Ask your health care provider before taking these. Do not take or discontinue any prescribed or over-the-counter medicine except on the advice of your health care provider or pharmacist.  Warfarin can have side effects, primarily excessive bruising or bleeding. You will need to hold pressure over cuts for longer than usual. Your health care provider or pharmacist will discuss other potential side effects.  Alcohol can change the body's  ability to handle warfarin. It is best to avoid alcoholic drinks or consume only very small amounts while taking warfarin. Notify your health care provider if you change your alcohol intake.  Notify your dentist or other health care providers before procedures.  Activity. Ask your health care provider how soon you can go back to normal activities. It is important to stay active to prevent blood clots. If you are on anticoagulant medicine, avoid contact sports.  Exercise. It is very important to exercise. This is especially important while traveling, sitting, or standing for long periods of time. Exercise your legs by walking or by pumping the muscles frequently. Take frequent walks.  Compression stockings. These are tight elastic stockings that apply pressure to the lower legs. This pressure can help  keep the blood in the legs from clotting. You may need to wear compression stockings at home to help prevent a DVT.  Do not smoke. If you smoke, quit. Ask your health care provider for help with quitting smoking.  Learn as much as you can about DVT. Knowing more about the condition should help you keep it from coming back.  Wear a medical alert bracelet or carry a medical alert card. SEEK MEDICAL CARE IF:  You notice a rapid heartbeat.  You feel weaker or more tired than usual.  You feel faint.  You notice increased bruising.  You feel your symptoms are not getting better in the time expected.  You believe you are having side effects of medicine. SEEK IMMEDIATE MEDICAL CARE IF:  You have chest pain.  You have trouble breathing.  You have new or increased swelling or pain in one leg.  You cough up blood.  You notice blood in vomit, in a bowel movement, or in urine. MAKE SURE YOU:  Understand these instructions.  Will watch your condition.  Will get help right away if you are not doing well or get worse. Document Released: 10/18/2005 Document Revised: 08/08/2013 Document  Reviewed: 06/25/2013 [facility] Patient Information 2014 [place], [place].

## 2014-03-20 NOTE — Progress Notes (Signed)
*  PRELIMINARY RESULTS* Vascular Ultrasound Right lower extremity venous duplex has been completed.  Preliminary findings: no evidence of DVT or baker's cyst.  Called results to Lillia AbedLindsay at Dr. Kathryne SharperMcKeown's office.    Farrel DemarkJill Eunice, RDMS, RVT  03/20/2014, 3:51 PM

## 2014-03-20 NOTE — Progress Notes (Signed)
Subjective:    Patient ID: Tanya [name], female    DOB: 03/17/1961, 53 y.o.   MRN: [id number]  HPI Comments: 53 yo AAF with leg pain. She has had 4 days of right leg pain on/ off around knee/ thigh area. She denies injury/ strain/ new exposures. She notes BP has been better outside of here. She is stressing with work but otherwise feels well. She denies any hx of DVT/ Clot in her or FHX. She denies any long periods of being sedentary. She has been walking more for exercise. She denies any other CV complaints.   Leg Pain       Medication List       This list is accurate as of: 03/20/14  2:08 PM.  Always use your most recent med list.               aspirin EC 81 MG tablet  Take 81 mg by mouth daily.     bisoprolol-hydrochlorothiazide 10-6.25 MG per tablet  Commonly known as:  ZIAC  Take 1 tablet by mouth daily.     cloNIDine 0.1 MG tablet  Commonly known as:  CATAPRES  Take 0.1 mg by mouth 3 (three) times daily.     furosemide 40 MG tablet  Commonly known as:  LASIX  Take 40 mg by mouth 2 (two) times daily.     losartan 100 MG tablet  Commonly known as:  COZAAR  TAKE 1 TABLET BY MOUTH EVERY DAY     Magnesium 250 MG Tabs  Take 250 mg by mouth daily.     metFORMIN 500 MG 24 hr tablet  Commonly known as:  GLUCOPHAGE-XR  TAKE 2 TABLETS BY MOUTH TWICE DAILY     MULTIVITAMIN PO  Take by mouth daily.     SUPER B COMPLEX PO  Take by mouth daily.     VITAMIN D PO  Take 5,000 Int'l Units by mouth daily. Takes three times weekly       Allergies  Allergen Reactions  . Naproxen Hives  . Meloxicam & Diet Manage Prod Rash   Past Medical History  Diagnosis Date  . Sarcoidosis   . Hypertension   . Hyperlipidemia   . Diabetes mellitus   . Allergy   . OSA (obstructive sleep apnea)   . Goiter   . Sarcoid   . Vitamin D deficiency   . Morbid obesity 12/24/2013     Review of Systems  Constitutional: Negative.   Musculoskeletal: Positive for joint swelling and  myalgias.   BP 162/80  Pulse 62  Temp(Src) 98.2 F (36.8 C) (Temporal)  Resp 18  Ht 5\' 4"  (1.626 m)  Wt 220 lb (99.791 kg)  BMI 37.74 kg/m2     Objective:   Physical Exam  Nursing note and vitals reviewed. Constitutional: She is oriented to person, place, and time. She appears well-developed and well-nourished.  HENT:  Head: Normocephalic and atraumatic.  Right Ear: External ear normal.  Left Ear: External ear normal.  Nose: Nose normal.  Mouth/Throat: Oropharynx is clear and moist.  Eyes: Conjunctivae are normal.  Neck: Normal range of motion.  Cardiovascular: Normal rate, regular rhythm, normal heart sounds and intact distal pulses.   Pulmonary/Chest: Effort normal and breath sounds normal.  Musculoskeletal: Normal range of motion. She exhibits edema. She exhibits no tenderness.       Legs: Lymphadenopathy:    She has no cervical adenopathy.  Neurological: She is alert and oriented to person, place, and  time. She has normal reflexes. No cranial nerve deficit.  Skin: Skin is warm and dry. No rash noted. No erythema.  Psychiatric: She has a normal mood and affect. Judgment normal.          Assessment & Plan:  1. HTN- Check BP call if >130/80, increase cardio   2. Right leg pain- ? DVT vs phlebitis vs cellulitis vs strain- Check doppler if negative, Prednisone DP and Doxy 100 mg both AD, if no relief XRAY

## 2014-03-22 ENCOUNTER — Other Ambulatory Visit: Payer: Self-pay | Admitting: Internal Medicine

## 2014-04-14 ENCOUNTER — Other Ambulatory Visit: Payer: Self-pay | Admitting: Emergency Medicine

## 2014-06-04 ENCOUNTER — Other Ambulatory Visit: Payer: Self-pay | Admitting: Physician Assistant

## 2014-06-07 ENCOUNTER — Other Ambulatory Visit: Payer: Self-pay | Admitting: Internal Medicine

## 2014-06-20 ENCOUNTER — Ambulatory Visit: Payer: Self-pay | Admitting: Physician Assistant

## 2014-07-09 ENCOUNTER — Other Ambulatory Visit: Payer: Self-pay | Admitting: Physician Assistant

## 2014-07-09 ENCOUNTER — Telehealth: Payer: Self-pay

## 2014-07-09 MED ORDER — LEVOFLOXACIN 500 MG PO TABS: 500.0000 mg | ORAL_TABLET | Freq: Every day | ORAL | Status: AC

## 2014-07-09 NOTE — Telephone Encounter (Signed)
Received a hand written note from front office staff, patient states that she went to Urgent Care on Friday and she is still not feeling well, states that she was given Azithromycin 250 mg, and a cough syrup, per Quentin Mulling, PA  Called patient and let her know that she sent in Levaquin, also advised her to get on a allergy pill and mucinex , if no improvement she will need to schedule a office visit, patient verbalized understanding

## 2014-07-20 ENCOUNTER — Other Ambulatory Visit: Payer: Self-pay | Admitting: Internal Medicine

## 2014-08-09 ENCOUNTER — Other Ambulatory Visit: Payer: Self-pay | Admitting: Internal Medicine

## 2014-08-15 ENCOUNTER — Ambulatory Visit: Payer: Self-pay | Admitting: Physician Assistant

## 2014-09-03 ENCOUNTER — Other Ambulatory Visit: Payer: Self-pay | Admitting: Internal Medicine

## 2014-09-24 ENCOUNTER — Other Ambulatory Visit: Payer: Self-pay | Admitting: Emergency Medicine

## 2014-09-25 ENCOUNTER — Other Ambulatory Visit: Payer: Self-pay | Admitting: Emergency Medicine

## 2014-09-30 ENCOUNTER — Other Ambulatory Visit: Payer: Self-pay | Admitting: *Deleted

## 2014-10-01 ENCOUNTER — Ambulatory Visit (INDEPENDENT_AMBULATORY_CARE_PROVIDER_SITE_OTHER): Payer: 59 | Admitting: Physician Assistant

## 2014-10-01 ENCOUNTER — Encounter: Payer: Self-pay | Admitting: Physician Assistant

## 2014-10-01 ENCOUNTER — Other Ambulatory Visit: Payer: Self-pay | Admitting: Physician Assistant

## 2014-10-01 VITALS — BP 132/80 | HR 60 | Temp 97.7°F | Resp 16 | Ht 64.0 in | Wt 237.0 lb

## 2014-10-01 DIAGNOSIS — E559 Vitamin D deficiency, unspecified: Secondary | ICD-10-CM

## 2014-10-01 DIAGNOSIS — I10 Essential (primary) hypertension: Secondary | ICD-10-CM

## 2014-10-01 DIAGNOSIS — Z23 Encounter for immunization: Secondary | ICD-10-CM

## 2014-10-01 DIAGNOSIS — Z79899 Other long term (current) drug therapy: Secondary | ICD-10-CM

## 2014-10-01 DIAGNOSIS — E785 Hyperlipidemia, unspecified: Secondary | ICD-10-CM

## 2014-10-01 DIAGNOSIS — E119 Type 2 diabetes mellitus without complications: Secondary | ICD-10-CM

## 2014-10-01 NOTE — Progress Notes (Signed)
Assessment and Plan:  Hypertension: Continue medication, monitor blood pressure at home. Continue DASH diet.  Reminder to go to the ER if any CP, SOB, nausea, dizziness, severe HA, changes vision/speech, left arm numbness and tingling and jaw pain. Cholesterol: Continue diet and exercise. Check cholesterol.  Diabetes-Continue diet and exercise. Check A1C, discussed possibly adding trulicity or phentermine for weight loss. Will check A1C and decide.  Vitamin D Def- check level and continue medications.  Obesity with co morbidities- long discussion about weight loss, diet, and exercise .Edema- elevate legs TID, increase activity, increase water, decrease sodium intake.  Wear compression socks more routinely if available. Return to the office if no change with symptoms. Check labs.   Continue diet and meds as discussed. Further disposition pending results of labs. Discussed med's effects and SE's.   HPI 53 y.o. female  presents for 3 month follow up with hypertension, hyperlipidemia, diabetes and vitamin D. Her blood pressure has been controlled at home, today their BP is BP: 132/80 mmHg She does workout. She denies chest pain, shortness of breath, dizziness.  She is not on cholesterol medication and denies myalgias. Her cholesterol is at goal. The cholesterol was:  12/24/2013: Cholesterol, Total 160; HDL Cholesterol by NMR 69; LDL (calc) 76; Triglycerides 75 She has been working on diet and exercise for Diabetes she is on bASA, on ARB, and on MF 2000mg  daily, she was tried on invokana in the past but has yeast infections and denies paresthesia of the feet, polydipsia and polyuria. Last A1C  was: 12/24/2013: Hemoglobin-A1c 6.6* Patient is on Vitamin D supplement. 12/24/2013: Vit D, 25-Hydroxy 106* BMI is Body mass index is 40.66 kg/(m^2)., she is working on diet and exercise. Complains of edema with sitting for a long time.  Wt Readings from Last 3 Encounters:  10/01/14 237 lb (107.502 kg)  03/20/14  220 lb (99.791 kg)  03/13/14 230 lb (104.327 kg)    Current Medications:  Current Outpatient Prescriptions on File Prior to Visit  Medication Sig Dispense Refill  . aspirin EC 81 MG tablet Take 81 mg by mouth daily.      . B Complex-C (SUPER B COMPLEX PO) Take by mouth daily.    . bisoprolol-hydrochlorothiazide (ZIAC) 10-6.25 MG per tablet TAKE 1 TABLET BY MOUTH EVERY DAY 90 tablet 3  . Cholecalciferol (VITAMIN D PO) Take 5,000 Int'l Units by mouth daily. Takes three times weekly    . cloNIDine (CATAPRES) 0.1 MG tablet TAKE 1 TABLET BY MOUTH 3 TIMES A DAY 270 tablet 0  . furosemide (LASIX) 40 MG tablet TAKE 1 TABLET BY MOUTH TWICE A DAY FOR BP & FLUID 180 tablet 0  . losartan (COZAAR) 100 MG tablet TAKE 1 TABLET BY MOUTH EVERY DAY 90 tablet 0  . Magnesium 250 MG TABS Take 250 mg by mouth daily.    . metFORMIN (GLUCOPHAGE-XR) 500 MG 24 hr tablet TAKE 2 TABLETS BY MOUTH TWICE DAILY 360 tablet 1  . Multiple Vitamins-Minerals (MULTIVITAMIN PO) Take by mouth daily.     No current facility-administered medications on file prior to visit.   Medical History:  Past Medical History  Diagnosis Date  . Sarcoidosis   . Hypertension   . Hyperlipidemia   . Diabetes mellitus   . Allergy   . OSA (obstructive sleep apnea)   . Goiter   . Sarcoid   . Vitamin D deficiency   . Morbid obesity 12/24/2013   Allergies:  Allergies  Allergen Reactions  . Naproxen Hives  .  Meloxicam & Diet Manage Prod Rash     Review of Systems: [X]  = complains of  [ ]  = denies  General: Fatigue [ ]  Fever [ ]  Chills [ ]  Weakness [ ]   Insomnia [ ]  Eyes: Redness [ ]  Blurred vision [ ]  Diplopia [ ]   ENT: Congestion [ ]  Sinus Pain [ ]  Post Nasal Drip [ ]  Sore Throat [ ]  Earache [ ]   Cardiac: Chest pain/pressure [ ]  SOB [ ]  Orthopnea [ ]   Palpitations [ ]   Paroxysmal nocturnal dyspnea[ ]  Claudication [ ]  Edema [ x]  Pulmonary: Cough [ ]  Wheezing[ ]   SOB [ ]   Snoring [ ]   GI: Nausea [ ]  Vomiting[ ]  Dysphagia[ ]   Heartburn[ ]  Abdominal pain [ ]  Constipation [ ] ; Diarrhea [ ] ; BRBPR [ ]  Melena[ ]  GU: Hematuria[ ]  Dysuria [ ]  Nocturia[ ]  Urgency [ ]   Hesitancy [ ]  Discharge [ ]  Neuro: Headaches[ ]  Vertigo[ ]  Paresthesias[ ]  Spasm [ ]  Speech changes [ ]  Incoordination [ ]   Ortho: Arthritis [ ]  Joint pain [ ]  Muscle pain [ ]  Joint swelling [ ]  Back Pain [ ]  Skin:  Rash [ ]   Pruritis [ ]  Change in skin lesion [ ]   Psych: Depression[ ]  Anxiety[ ]  Confusion [ ]  Memory loss [ ]   Heme/Lypmh: Bleeding [ ]  Bruising [ ]  Enlarged lymph nodes [ ]   Endocrine: Visual blurring [ ]  Paresthesia [ ]  Polyuria [ ]  Polydypsea [ ]    Heat/cold intolerance [ ]  Hypoglycemia [ ]   Family history- Review and unchanged Social history- Review and unchanged Physical Exam: BP 132/80 mmHg  Pulse 60  Temp(Src) 97.7 F (36.5 C)  Resp 16  Ht 5\' 4"  (1.626 m)  Wt 237 lb (107.502 kg)  BMI 40.66 kg/m2 Wt Readings from Last 3 Encounters:  10/01/14 237 lb (107.502 kg)  03/20/14 220 lb (99.791 kg)  03/13/14 230 lb (104.327 kg)   General Appearance: Well nourished, in no apparent distress. Eyes: PERRLA, EOMs, conjunctiva no swelling or erythema Sinuses: No Frontal/maxillary tenderness ENT/Mouth: Ext aud canals clear, TMs without erythema, bulging. No erythema, swelling, or exudate on post pharynx.  Tonsils not swollen or erythematous. Hearing normal.  Neck: Supple, thyroid normal.  Respiratory: Respiratory effort normal, BS equal bilaterally without rales, rhonchi, wheezing or stridor.  Cardio: RRR with no MRGs. Brisk peripheral pulses with 1+ edema.  Abdomen: Soft, + BS.  Non tender, no guarding, rebound, hernias, masses. Lymphatics: Non tender without lymphadenopathy.  Musculoskeletal: Full ROM, 5/5 strength, normal gait.  Skin: Warm, dry without rashes, lesions, ecchymosis.  Neuro: Cranial nerves intact. No cerebellar symptoms. Sensation intact.  Psych: Awake and oriented X 3, normal affect, Insight and Judgment appropriate.     Quentin Mullingollier, Amanda, PA-C 10:15 AM [facility]

## 2014-10-01 NOTE — Patient Instructions (Addendum)
If your morning sugar is always below 100 then the issue is with your sugar spiking after meals. Try to take your blood sugar approximately 2 hours after eating, this number should be less than 200. If it is not, think about the foods that you ate and better choices you can make.      Bad carbs also include fruit juice, alcohol, and sweet tea. These are empty calories that do not signal to your brain that you are full.   Please remember the good carbs are still carbs which convert into sugar. So please measure them out no more than 1/2-1 cup of rice, oatmeal, pasta, and beans  Veggies are however free foods! Pile them on.   Not all fruit is created equal. Please see the list below, the fruit at the bottom is higher in sugars than the fruit at the top. Please avoid all dried fruits.    Varicose Veins Varicose veins are veins that have become enlarged and twisted. CAUSES This condition is the result of valves in the veins not working properly. Valves in the veins help return blood from the leg to the heart. When your calf muscles squeeze, the blood moves up your leg then the valves close and this continues until the blood gets back to your heart.  If these valves are damaged, blood flows backwards and backs up into the veins in the leg near the skin OR if your are sitting/standing for a long time without using your calf muscles the blood will back up into the veins in your legs. This causes the veins to become larger. People who are on their feet a lot, sit a lot without walking (like on a plane, at a desk, or in a car), who are pregnant, or who are overweight are more likely to develop varicose veins. SYMPTOMS   Bulging, twisted-appearing, bluish veins, most commonly found on the legs.  Leg pain or a feeling of heaviness. These symptoms may be worse at the end of the day.  Leg swelling.  Skin color changes. DIAGNOSIS  Varicose veins can usually be diagnosed with an exam of your legs by your  caregiver. He or she may recommend an ultrasound of your leg veins. TREATMENT  Most varicose veins can be treated at home.However, other treatments are available for people who have persistent symptoms or who want to treat the cosmetic appearance of the varicose veins. But this is only cosmetic and they will return if not properly treated. These include:  Laser treatment of very small varicose veins.  Medicine that is shot (injected) into the vein. This medicine hardens the walls of the vein and closes off the vein. This treatment is called sclerotherapy. Afterwards, you may need to wear clothing or bandages that apply pressure.  Surgery. HOME CARE INSTRUCTIONS   Do not stand or sit in one position for long periods of time. Do not sit with your legs crossed. Rest with your legs raised during the day.  Your legs have to be higher than your heart so that gravity will force the valves to open, so please really elevate your legs.   Wear elastic stockings or support hose. Do not wear other tight, encircling garments around the legs, pelvis, or waist.  [employer]  has a wide variety of well priced compression stockings. [address], [place] [id number] [phone]  OR THERE ARE COPPER INFUSED COMPRESSION SOCKS AT [facility] OR [facility]  Walk as much as possible to increase  blood flow.  Raise the foot of your bed at night with 2-inch blocks.  If you get a cut in the skin over the vein and the vein bleeds, lie down with your leg raised and press on it with a clean cloth until the bleeding stops. Then place a bandage (dressing) on the cut. See your caregiver if it continues to bleed or needs stitches. SEEK MEDICAL CARE IF:   The skin around your ankle starts to break down.  You have pain, redness, tenderness, or hard swelling developing in your leg over a vein.  You are uncomfortable due to leg pain. Document Released: 07/28/2005 Document Revised: 01/10/2012 Document Reviewed:  12/14/2010 [facility] Patient Information 2014 [place], [place].

## 2014-10-02 ENCOUNTER — Other Ambulatory Visit: Payer: Self-pay

## 2014-10-02 LAB — CBC WITH DIFFERENTIAL/PLATELET
BASOS PCT: 1 % (ref 0–1)
Basophils Absolute: 0.1 10*3/uL (ref 0.0–0.1)
Eosinophils Absolute: 0.2 10*3/uL (ref 0.0–0.7)
Eosinophils Relative: 3 % (ref 0–5)
HCT: 39.6 % (ref 36.0–46.0)
Hemoglobin: 12.5 g/dL (ref 12.0–15.0)
LYMPHS PCT: 51 % — AB (ref 12–46)
Lymphs Abs: 3.2 10*3/uL (ref 0.7–4.0)
MCH: 25.6 pg — ABNORMAL LOW (ref 26.0–34.0)
MCHC: 31.6 g/dL (ref 30.0–36.0)
MCV: 81.1 fL (ref 78.0–100.0)
MONO ABS: 0.4 10*3/uL (ref 0.1–1.0)
MPV: 10.4 fL (ref 9.4–12.4)
Monocytes Relative: 6 % (ref 3–12)
NEUTROS PCT: 39 % — AB (ref 43–77)
Neutro Abs: 2.4 10*3/uL (ref 1.7–7.7)
PLATELETS: 418 10*3/uL — AB (ref 150–400)
RBC: 4.88 MIL/uL (ref 3.87–5.11)
RDW: 15.3 % (ref 11.5–15.5)
WBC: 6.2 10*3/uL (ref 4.0–10.5)

## 2014-10-02 LAB — LIPID PANEL
CHOL/HDL RATIO: 1.9 ratio
Cholesterol: 185 mg/dL (ref 0–200)
HDL: 96 mg/dL (ref >39)
LDL CALC: 70 mg/dL (ref 0–99)
TRIGLYCERIDES: 94 mg/dL (ref <150)
VLDL: 19 mg/dL (ref 0–40)

## 2014-10-02 LAB — HEPATIC FUNCTION PANEL
ALK PHOS: 56 U/L (ref 39–117)
ALT: 12 U/L (ref 0–35)
AST: 14 U/L (ref 0–37)
Albumin: 4.2 g/dL (ref 3.5–5.2)
BILIRUBIN INDIRECT: 0.2 mg/dL (ref 0.2–1.2)
BILIRUBIN TOTAL: 0.3 mg/dL (ref 0.2–1.2)
Bilirubin, Direct: 0.1 mg/dL (ref 0.0–0.3)
Total Protein: 7 g/dL (ref 6.0–8.3)

## 2014-10-02 LAB — INSULIN, FASTING: Insulin fasting, serum: 8.8 u[IU]/mL (ref 2.0–19.6)

## 2014-10-02 LAB — VITAMIN D 25 HYDROXY (VIT D DEFICIENCY, FRACTURES): Vit D, 25-Hydroxy: 81 ng/mL (ref 30–100)

## 2014-10-02 LAB — BASIC METABOLIC PANEL WITH GFR
BUN: 12 mg/dL (ref 6–23)
CHLORIDE: 99 meq/L (ref 96–112)
CO2: 27 mEq/L (ref 19–32)
Calcium: 9.5 mg/dL (ref 8.4–10.5)
Creat: 0.55 mg/dL (ref 0.50–1.10)
GFR, Est African American: >89 mL/min
GFR, Est Non African American: >89 mL/min
Glucose, Bld: 114 mg/dL — ABNORMAL HIGH (ref 70–99)
POTASSIUM: 4.3 meq/L (ref 3.5–5.3)
SODIUM: 138 meq/L (ref 135–145)

## 2014-10-02 LAB — HEMOGLOBIN A1C
Hgb A1c MFr Bld: 6.9 % — ABNORMAL HIGH (ref <5.7)
Mean Plasma Glucose: 151 mg/dL — ABNORMAL HIGH (ref <117)

## 2014-10-02 LAB — TSH: TSH: 0.582 u[IU]/mL (ref 0.350–4.500)

## 2014-10-02 LAB — MAGNESIUM: Magnesium: 1.8 mg/dL (ref 1.5–2.5)

## 2014-10-02 MED ORDER — CLONIDINE HCL 0.1 MG PO TABS: 0.1000 mg | ORAL_TABLET | Freq: Three times a day (TID) | ORAL | Status: DC

## 2014-10-17 ENCOUNTER — Other Ambulatory Visit: Payer: Self-pay

## 2014-10-17 MED ORDER — LOSARTAN POTASSIUM 100 MG PO TABS: 100.0000 mg | ORAL_TABLET | Freq: Every day | ORAL | Status: DC

## 2014-11-08 ENCOUNTER — Other Ambulatory Visit: Payer: Self-pay | Admitting: Physician Assistant

## 2014-11-12 ENCOUNTER — Encounter: Payer: Self-pay | Admitting: Physician Assistant

## 2014-12-10 ENCOUNTER — Encounter: Payer: Self-pay | Admitting: Physician Assistant

## 2014-12-10 ENCOUNTER — Ambulatory Visit (INDEPENDENT_AMBULATORY_CARE_PROVIDER_SITE_OTHER): Payer: 59 | Admitting: Physician Assistant

## 2014-12-10 VITALS — BP 128/80 | HR 72 | Temp 98.2°F | Resp 16 | Ht 64.0 in | Wt 235.0 lb

## 2014-12-10 DIAGNOSIS — J069 Acute upper respiratory infection, unspecified: Secondary | ICD-10-CM

## 2014-12-10 MED ORDER — PREDNISONE 20 MG PO TABS: ORAL_TABLET | ORAL | Status: DC

## 2014-12-10 MED ORDER — AZITHROMYCIN 250 MG PO TABS: ORAL_TABLET | ORAL | Status: DC

## 2014-12-10 MED ORDER — BENZONATATE 100 MG PO CAPS: 100.0000 mg | ORAL_CAPSULE | Freq: Four times a day (QID) | ORAL | Status: DC | PRN

## 2014-12-10 NOTE — Patient Instructions (Signed)
I will give you a prescription for an antibiotic, but please only take it if you are not feeling better in 7-10 days.  Bronchitis is mostly caused by viruses and the antibiotic will do nothing.  PLEASE TRY TO DO OVER THE COUNTER TREATMENT AND/OR PREDNISONE FOR 5-7 DAYS AND IF YOU ARE NOT GETTING BETTER OR GETTING WORSE THEN YOU CAN START ON AN ANTIBIOTIC GIVEN.  Can take the prednisone AT NIGHT WITH DINNER, it take 8-12 hours to start working so it will NOT affect your sleeping if you take it at night with your food!! Take two pills the first night and 1 or two pill the second night and then 1 pill the other nights.    Rest and stay hydrated.  Make sure you drink plenty of fluids to make sure urine is clear when you urinate.  Water will help thin out mucous. - Take Mucinex DM- Maximum Strength over the counter to thin out and cough up the thick mucous.  Please follow directions on box.  Risk of antibiotic use: About 1 in 4 people who take antibiotics have side effects including stomach problems, dizziness, or rashes. Those problems clear up soon after stopping the drugs, but in rare cases antibiotics can cause severe allergic reaction. Over use of antibiotics also encourages the growth of bacteria that can't be controlled easily with drugs. That makes you more vunerable to antibiotic-resistant infections and undermines the benefits of antibiotics for others.   Waste of Money: Antibiotics often aren't very expensive, but any money spent on unnecessary drugs is money down the drain.   When are antibiotics needed? Only when symptoms last longer than a week.  Start to improve but then worsen again  Please call the office or message through My Chart if you have any questions.   Acute Bronchitis Bronchitis is when the airways that extend from the windpipe into the lungs get red, puffy, and painful (inflamed). Bronchitis often causes thick spit (mucus) to develop. This leads to a cough. A cough is the  most common symptom of bronchitis. In acute bronchitis, the condition usually begins suddenly and goes away over time (usually in 2 weeks). Smoking, allergies, and asthma can make bronchitis worse. Repeated episodes of bronchitis may cause more lung problems.  Most common cause of Bronchitis is viruses (rhinovirus, coronavirus, RSV).  Therefore, not requiring an antibiotic; as antibiotics only treat bacterial infections.  HOME CARE  Rest.  Drink enough fluids to keep your pee (urine) clear or pale yellow (unless you need to limit fluids as told by your doctor).  Only take over-the-counter or prescription medicines as told by your doctor.  Avoid smoking and secondhand smoke. These can make bronchitis worse. If you are a smoker, think about using nicotine gum or skin patches. Quitting smoking will help your lungs heal faster.  Reduce the chance of getting bronchitis again by:  Washing your hands often.  Avoiding people with cold symptoms.  Trying not to touch your hands to your mouth, nose, or eyes.  Follow up with your doctor as told. GET HELP IF: Your symptoms do not improve after 1 week of treatment. Symptoms include:  Cough.  Fever.  Coughing up thick spit.  Body aches.  Chest congestion.  Chills.  Shortness of breath.  Sore throat. GET HELP RIGHT AWAY IF:   You have an increased fever.  You have chills.  You have severe shortness of breath.  You have bloody thick spit (sputum).  You throw up (vomit)  often.  You lose too much body fluid (dehydration).  You have a severe headache.  You faint. MAKE SURE YOU:   Understand these instructions.  Will watch your condition.  Will get help right away if you are not doing well or get worse. Document Released: 04/05/2008 Document Revised: 06/20/2013 Document Reviewed: 04/10/2013 [facility] Patient Information 2015 [place], [place]. This information is not intended to replace advice given to you by your health  care provider. Make sure you discuss any questions you have with your health care provider.   HOW TO TREAT VIRAL COUGH AND COLD SYMPTOMS:  -Symptoms usually last at least 1 week with the worst symptoms being around day 4.  - colds usually start with a sore throat and end with a cough, and the cough can take 2 weeks to get better.  -No antibiotics are needed for colds, flu, sore throats, cough, bronchitis UNLESS symptoms are longer than 7 days OR if you are getting better then get drastically worse.  -There are a lot of combination medications (Dayquil, Nyquil, Vicks 44, tyelnol cold and sinus, ETC). Please look at the ingredients on the back so that you are treating the correct symptoms and not doubling up on medications/ingredients.    Medicines you can use  Nasal congestion  - pseudoephedrine (Sudafed)- behind the counter, do not use if you have high blood pressure, medicine that have -D in them.  - phenylephrine (Sudafed PE) -Dextormethorphan + chlorpheniramine (Coridcidin HBP)- okay if you have high blood pressure -Oxymetazoline (Afrin) nasal spray- LIMIT to 3 days -Saline nasal spray -Neti pot (used distilled or bottled water)  Ear pain/congestion  -pseudoephedrine (sudafed) - Nasonex/flonase nasal spray  Fever  -Acetaminophen (Tyelnol) -Ibuprofen (Advil, motrin, aleve)  Sore Throat  -Acetaminophen (Tyelnol) -Ibuprofen (Advil, motrin, aleve) -Drink a lot of water -Gargle with salt water - Rest your voice (don't talk) -Throat sprays -Cough drops  Body Aches  -Acetaminophen (Tyelnol) -Ibuprofen (Advil, motrin, aleve)  Headache  -Acetaminophen (Tyelnol) -Ibuprofen (Advil, motrin, aleve) - Exedrin, Exedrin Migraine  Allergy symptoms (cough, sneeze, runny nose, itchy eyes) -Claritin or loratadine cheapest but likely the weakest  -Zyrtec or certizine at night because it can make you sleepy -The strongest is allegra or fexafinadine  Cheapest at [facility], [employer],  [facility]  Cough  -Dextromethorphan (Delsym)- medicine that has DM in it -Guafenesin (Mucinex/Robitussin) - cough drops - drink lots of water  Chest Congestion  -Guafenesin (Mucinex/Robitussin)  Red Itchy Eyes  - Naphcon-A  Upset Stomach  - Bland diet (nothing spicy, greasy, fried, and high acid foods like tomatoes, oranges, berries) -OKAY- cereal, bread, soup, crackers, rice -Eat smaller more frequent meals -reduce caffeine, no alcohol -Loperamide (Imodium-AD) if diarrhea -Prevacid for heart burn  General health when sick  -Hydration -wash your hands frequently -keep surfaces clean -change pillow cases and sheets often -Get fresh air but do not exercise strenuously -Vitamin D, double up on it - Vitamin C -Zinc

## 2014-12-10 NOTE — Progress Notes (Signed)
   Subjective:    Patient ID: Tanya [name], female    DOB: 07/24/1961, 54 y.o.   MRN: [id number]  Cough This is a new problem. Episode onset: 8 days. The problem has been gradually worsening ([facility], had negative flu and strep, has been on augmentin, mucinex and claritin without relief). The problem occurs constantly. The cough is productive of purulent sputum. Associated symptoms include ear pain (left ear), a fever (x 2 days, resolved), nasal congestion, postnasal drip, rhinorrhea and a sore throat (resolved). Pertinent negatives include no chest pain, chills, ear congestion, headaches, heartburn, hemoptysis, myalgias, rash, shortness of breath, sweats, weight loss or wheezing. The symptoms are aggravated by lying down. She has tried OTC cough suppressant and prescription cough suppressant (ABX x 4 days, mucinex, and claritin) for the symptoms. The treatment provided no relief.     Review of Systems  Constitutional: Positive for fever (x 2 days, resolved). Negative for chills, weight loss and diaphoresis.  HENT: Positive for congestion, ear pain (left ear), postnasal drip, rhinorrhea, sinus pressure, sneezing and sore throat (resolved).   Respiratory: Positive for cough. Negative for hemoptysis, chest tightness, shortness of breath and wheezing.   Cardiovascular: Negative.  Negative for chest pain.  Gastrointestinal: Negative.  Negative for heartburn.  Genitourinary: Negative.   Musculoskeletal: Negative for myalgias and neck pain.  Skin: Negative for rash.  Neurological: Negative for headaches.       Objective:   Physical Exam  Constitutional: She is oriented to person, place, and time. She appears well-developed and well-nourished.  HENT:  Head: Normocephalic and atraumatic.  Right Ear: External ear normal.  Left Ear: External ear normal.  Nose: Nose normal.  Mouth/Throat: Oropharynx is clear and moist.  Eyes: Conjunctivae are normal. Pupils are equal, round,  and reactive to light.  Neck: Normal range of motion. Neck supple.  Cardiovascular: Normal rate and regular rhythm.   Pulmonary/Chest: Effort normal. No respiratory distress. She has wheezes. She has no rales. She exhibits no tenderness.  Abdominal: Soft. Bowel sounds are normal.  Lymphadenopathy:    She has no cervical adenopathy.  Neurological: She is alert and oriented to person, place, and time.  Skin: Skin is warm and dry.       Assessment & Plan:  1. Upper respiratory infection Will hold the zpak and take if she is not getting better, increase fluids, rest, cont allergy pill - azithromycin (ZITHROMAX) 250 MG tablet; 2 tablets by mouth today then one tablet daily for 4 days.  Dispense: 6 tablet; Refill: 1 - predniSONE (DELTASONE) 20 MG tablet; 2 tablets daily for 3 days, 1 tablet daily for 4 days.  Dispense: 10 tablet; Refill: 0 - benzonatate (TESSALON PERLES) 100 MG capsule; Take 1 capsule (100 mg total) by mouth every 6 (six) hours as needed for cough.  Dispense: 60 capsule; Refill: 1

## 2014-12-24 ENCOUNTER — Ambulatory Visit: Payer: Self-pay | Admitting: Physician Assistant

## 2014-12-26 ENCOUNTER — Encounter: Payer: Self-pay | Admitting: Physician Assistant

## 2015-02-14 ENCOUNTER — Encounter: Payer: Self-pay | Admitting: Physician Assistant

## 2015-02-28 ENCOUNTER — Other Ambulatory Visit: Payer: Self-pay | Admitting: Internal Medicine

## 2015-04-02 ENCOUNTER — Encounter: Payer: Self-pay | Admitting: Internal Medicine

## 2015-04-02 ENCOUNTER — Ambulatory Visit (INDEPENDENT_AMBULATORY_CARE_PROVIDER_SITE_OTHER): Payer: 59 | Admitting: Internal Medicine

## 2015-04-02 VITALS — BP 180/92 | HR 60 | Temp 98.0°F | Resp 18 | Ht 64.25 in | Wt 235.0 lb

## 2015-04-02 DIAGNOSIS — E785 Hyperlipidemia, unspecified: Secondary | ICD-10-CM

## 2015-04-02 DIAGNOSIS — R5383 Other fatigue: Secondary | ICD-10-CM

## 2015-04-02 DIAGNOSIS — I10 Essential (primary) hypertension: Secondary | ICD-10-CM

## 2015-04-02 DIAGNOSIS — Z79899 Other long term (current) drug therapy: Secondary | ICD-10-CM

## 2015-04-02 DIAGNOSIS — G4733 Obstructive sleep apnea (adult) (pediatric): Secondary | ICD-10-CM

## 2015-04-02 DIAGNOSIS — Z1212 Encounter for screening for malignant neoplasm of rectum: Secondary | ICD-10-CM

## 2015-04-02 DIAGNOSIS — E559 Vitamin D deficiency, unspecified: Secondary | ICD-10-CM

## 2015-04-02 DIAGNOSIS — E119 Type 2 diabetes mellitus without complications: Secondary | ICD-10-CM

## 2015-04-02 LAB — BASIC METABOLIC PANEL WITH GFR
BUN: 13 mg/dL (ref 6–23)
CO2: 29 meq/L (ref 19–32)
Calcium: 9 mg/dL (ref 8.4–10.5)
Chloride: 99 mEq/L (ref 96–112)
Creat: 0.48 mg/dL — ABNORMAL LOW (ref 0.50–1.10)
GFR, Est Non African American: >89 mL/min
Glucose, Bld: 93 mg/dL (ref 70–99)
Potassium: 4.1 mEq/L (ref 3.5–5.3)
Sodium: 138 mEq/L (ref 135–145)

## 2015-04-02 LAB — CBC WITH DIFFERENTIAL/PLATELET
BASOS ABS: 0.1 10*3/uL (ref 0.0–0.1)
BASOS PCT: 1 % (ref 0–1)
EOS ABS: 0.1 10*3/uL (ref 0.0–0.7)
EOS PCT: 2 % (ref 0–5)
HCT: 38.1 % (ref 36.0–46.0)
HEMOGLOBIN: 12.2 g/dL (ref 12.0–15.0)
LYMPHS ABS: 3.4 10*3/uL (ref 0.7–4.0)
Lymphocytes Relative: 55 % — ABNORMAL HIGH (ref 12–46)
MCH: 25.3 pg — AB (ref 26.0–34.0)
MCHC: 32 g/dL (ref 30.0–36.0)
MCV: 79 fL (ref 78.0–100.0)
MPV: 9.9 fL (ref 8.6–12.4)
Monocytes Absolute: 0.4 10*3/uL (ref 0.1–1.0)
Monocytes Relative: 6 % (ref 3–12)
NEUTROS PCT: 36 % — AB (ref 43–77)
Neutro Abs: 2.2 10*3/uL (ref 1.7–7.7)
Platelets: 427 10*3/uL — ABNORMAL HIGH (ref 150–400)
RBC: 4.82 MIL/uL (ref 3.87–5.11)
RDW: 15.1 % (ref 11.5–15.5)
WBC: 6.1 10*3/uL (ref 4.0–10.5)

## 2015-04-02 LAB — HEPATIC FUNCTION PANEL
ALT: 12 U/L (ref 0–35)
AST: 14 U/L (ref 0–37)
Albumin: 4.1 g/dL (ref 3.5–5.2)
Alkaline Phosphatase: 56 U/L (ref 39–117)
Bilirubin, Direct: 0.1 mg/dL (ref 0.0–0.3)
Indirect Bilirubin: 0.2 mg/dL (ref 0.2–1.2)
TOTAL PROTEIN: 6.6 g/dL (ref 6.0–8.3)
Total Bilirubin: 0.3 mg/dL (ref 0.2–1.2)

## 2015-04-02 LAB — IRON AND TIBC
%SAT: 12 % — ABNORMAL LOW (ref 20–55)
Iron: 43 ug/dL (ref 42–145)
TIBC: 347 ug/dL (ref 250–470)
UIBC: 304 ug/dL (ref 125–400)

## 2015-04-02 LAB — LIPID PANEL
Cholesterol: 171 mg/dL (ref 0–200)
HDL: 95 mg/dL (ref >=46)
LDL Cholesterol: 58 mg/dL (ref 0–99)
Total CHOL/HDL Ratio: 1.8 Ratio
Triglycerides: 90 mg/dL (ref <150)
VLDL: 18 mg/dL (ref 0–40)

## 2015-04-02 LAB — MAGNESIUM: Magnesium: 1.8 mg/dL (ref 1.5–2.5)

## 2015-04-02 MED ORDER — CLONIDINE HCL 0.2 MG PO TABS: 0.2000 mg | ORAL_TABLET | Freq: Two times a day (BID) | ORAL | Status: DC

## 2015-04-02 NOTE — Patient Instructions (Signed)
Preventive Care for Adults  A healthy lifestyle and preventive care can promote health and wellness. Preventive health guidelines for women include the following key practices.  A routine yearly physical is a good way to check with your health care provider about your health and preventive screening. It is a chance to share any concerns and updates on your health and to receive a thorough exam.  Visit your dentist for a routine exam and preventive care every 6 months. Brush your teeth twice a day and floss once a day. Good oral hygiene prevents tooth decay and gum disease.  The frequency of eye exams is based on your age, health, family medical history, use of contact lenses, and other factors. Follow your health care provider's recommendations for frequency of eye exams.  Eat a healthy diet. Foods like vegetables, fruits, whole grains, low-fat dairy products, and lean protein foods contain the nutrients you need without too many calories. Decrease your intake of foods high in solid fats, added sugars, and salt. Eat the right amount of calories for you.Get information about a proper diet from your health care provider, if necessary.  Regular physical exercise is one of the most important things you can do for your health. Most adults should get at least 150 minutes of moderate-intensity exercise (any activity that increases your heart rate and causes you to sweat) each week. In addition, most adults need muscle-strengthening exercises on 2 or more days a week.  Maintain a healthy weight. The body mass index (BMI) is a screening tool to identify possible weight problems. It provides an estimate of body fat based on height and weight. Your health care provider can find your BMI and can help you achieve or maintain a healthy weight.For adults 20 years and older:  A BMI below 18.5 is considered underweight.  A BMI of 18.5 to 24.9 is normal.  A BMI of 25 to 29.9 is considered overweight.  A BMI  of 30 and above is considered obese.  Maintain normal blood lipids and cholesterol levels by exercising and minimizing your intake of saturated fat. Eat a balanced diet with plenty of fruit and vegetables. Blood tests for lipids and cholesterol should begin at age 58 and be repeated every 5 years. If your lipid or cholesterol levels are high, you are over 50, or you are at high risk for heart disease, you may need your cholesterol levels checked more frequently.Ongoing high lipid and cholesterol levels should be treated with medicines if diet and exercise are not working.  If you smoke, find out from your health care provider how to quit. If you do not use tobacco, do not start.  Lung cancer screening is recommended for adults aged 58-80 years who are at high risk for developing lung cancer because of a history of smoking. A yearly low-dose CT scan of the lungs is recommended for people who have at least a 30-pack-year history of smoking and are a current smoker or have quit within the past 15 years. A pack year of smoking is smoking an average of 1 pack of cigarettes a day for 1 year (for example: 1 pack a day for 30 years or 2 packs a day for 15 years). Yearly screening should continue until the smoker has stopped smoking for at least 15 years. Yearly screening should be stopped for people who develop a health problem that would prevent them from having lung cancer treatment.  High blood pressure causes heart disease and increases the risk of  stroke. Your blood pressure should be checked at least every 1 to 2 years. Ongoing high blood pressure should be treated with medicines if weight loss and exercise do not work.  If you are 55-79 years old, ask your health care provider if you should take aspirin to prevent strokes.  Diabetes screening involves taking a blood sample to check your fasting blood sugar level. This should be done once every 3 years, after age 45, if you are within normal weight and  without risk factors for diabetes. Testing should be considered at a younger age or be carried out more frequently if you are overweight and have at least 1 risk factor for diabetes.  Breast cancer screening is essential preventive care for women. You should practice "breast self-awareness." This means understanding the normal appearance and feel of your breasts and may include breast self-examination. Any changes detected, no matter how small, should be reported to a health care provider. Women in their 20s and 30s should have a clinical breast exam (CBE) by a health care provider as part of a regular health exam every 1 to 3 years. After age 40, women should have a CBE every year. Starting at age 40, women should consider having a mammogram (breast X-ray test) every year. Women who have a family history of breast cancer should talk to their health care provider about genetic screening. Women at a high risk of breast cancer should talk to their health care providers about having an MRI and a mammogram every year.  Breast cancer gene (BRCA)-related cancer risk assessment is recommended for women who have family members with BRCA-related cancers. BRCA-related cancers include breast, ovarian, tubal, and peritoneal cancers. Having family members with these cancers may be associated with an increased risk for harmful changes (mutations) in the breast cancer genes BRCA1 and BRCA2. Results of the assessment will determine the need for genetic counseling and BRCA1 and BRCA2 testing.  Routine pelvic exams to screen for cancer are no longer recommended for nonpregnant women who are considered low risk for cancer of the pelvic organs (ovaries, uterus, and vagina) and who do not have symptoms. Ask your health care provider if a screening pelvic exam is right for you.  If you have had past treatment for cervical cancer or a condition that could lead to cancer, you need Pap tests and screening for cancer for at least 20  years after your treatment. If Pap tests have been discontinued, your risk factors (such as having a new sexual partner) need to be reassessed to determine if screening should be resumed. Some women have medical problems that increase the chance of getting cervical cancer. In these cases, your health care provider may recommend more frequent screening and Pap tests.  Colorectal cancer can be detected and often prevented. Most routine colorectal cancer screening begins at the age of 50 years and continues through age 75 years. However, your health care provider may recommend screening at an earlier age if you have risk factors for colon cancer. On a yearly basis, your health care provider may provide home test kits to check for hidden blood in the stool. Use of a small camera at the end of a tube, to directly examine the colon (sigmoidoscopy or colonoscopy), can detect the earliest forms of colorectal cancer. Talk to your health care provider about this at age 50, when routine screening begins. Direct exam of the colon should be repeated every 5-10 years through age 75 years, unless early forms of pre-cancerous   polyps or small growths are found.  Hepatitis C blood testing is recommended for all people born from [birth year] through 1966 and any individual with known risks for hepatitis C.  Pra  Osteoporosis is a disease in which the bones lose minerals and strength with aging. This can result in serious bone fractures or breaks. The risk of osteoporosis can be identified using a bone density scan. Women ages 8 years and over and women at risk for fractures or osteoporosis should discuss screening with their health care providers. Ask your health care provider whether you should take a calcium supplement or vitamin D to reduce the rate of osteoporosis.  Menopause can be associated with physical symptoms and risks. Hormone replacement therapy is available to decrease symptoms and risks. You should talk to your  health care provider about whether hormone replacement therapy is right for you.  Use sunscreen. Apply sunscreen liberally and repeatedly throughout the day. You should seek shade when your shadow is shorter than you. Protect yourself by wearing long sleeves, pants, a wide-brimmed hat, and sunglasses year round, whenever you are outdoors.  Once a month, do a whole body skin exam, using a mirror to look at the skin on your back. Tell your health care provider of new moles, moles that have irregular borders, moles that are larger than a pencil eraser, or moles that have changed in shape or color.  Stay current with required vaccines (immunizations).  Influenza vaccine. All adults should be immunized every year.  Tetanus, diphtheria, and acellular pertussis (Td, Tdap) vaccine. Pregnant women should receive 1 dose of Tdap vaccine during each pregnancy. The dose should be obtained regardless of the length of time since the last dose. Immunization is preferred during the 27th-36th week of gestation. An adult who has not previously received Tdap or who does not know her vaccine status should receive 1 dose of Tdap. This initial dose should be followed by tetanus and diphtheria toxoids (Td) booster doses every 10 years. Adults with an unknown or incomplete history of completing a 3-dose immunization series with Td-containing vaccines should begin or complete a primary immunization series including a Tdap dose. Adults should receive a Td booster every 10 years.  Varicella vaccine. An adult without evidence of immunity to varicella should receive 2 doses or a second dose if she has previously received 1 dose. Pregnant females who do not have evidence of immunity should receive the first dose after pregnancy. This first dose should be obtained before leaving the health care facility. The second dose should be obtained 4-8 weeks after the first dose.  Human papillomavirus (HPV) vaccine. Females aged 13-26 years  who have not received the vaccine previously should obtain the 3-dose series. The vaccine is not recommended for use in pregnant females. However, pregnancy testing is not needed before receiving a dose. If a female is found to be pregnant after receiving a dose, no treatment is needed. In that case, the remaining doses should be delayed until after the pregnancy. Immunization is recommended for any person with an immunocompromised condition through the age of 84 years if she did not get any or all doses earlier. During the 3-dose series, the second dose should be obtained 4-8 weeks after the first dose. The third dose should be obtained 24 weeks after the first dose and 16 weeks after the second dose.  Zoster vaccine. One dose is recommended for adults aged 46 years or older unless certain conditions are present.  Measles, mumps, and rubella (  MMR) vaccine. Adults born before [birth year] generally are considered immune to measles and mumps. Adults born in [birth year] or later should have 1 or more doses of MMR vaccine unless there is a contraindication to the vaccine or there is laboratory evidence of immunity to each of the three diseases. A routine second dose of MMR vaccine should be obtained at least 28 days after the first dose for students attending postsecondary schools, health care workers, or international travelers. People who received inactivated measles vaccine or an unknown type of measles vaccine during 1963-1967 should receive 2 doses of MMR vaccine. People who received inactivated mumps vaccine or an unknown type of mumps vaccine before 1979 and are at high risk for mumps infection should consider immunization with 2 doses of MMR vaccine. For females of childbearing age, rubella immunity should be determined. If there is no evidence of immunity, females who are not pregnant should be vaccinated. If there is no evidence of immunity, females who are pregnant should delay immunization until after pregnancy.  Unvaccinated health care workers born before [birth year] who lack laboratory evidence of measles, mumps, or rubella immunity or laboratory confirmation of disease should consider measles and mumps immunization with 2 doses of MMR vaccine or rubella immunization with 1 dose of MMR vaccine.  Pneumococcal 13-valent conjugate (PCV13) vaccine. When indicated, a person who is uncertain of her immunization history and has no record of immunization should receive the PCV13 vaccine. An adult aged 60 years or older who has certain medical conditions and has not been previously immunized should receive 1 dose of PCV13 vaccine. This PCV13 should be followed with a dose of pneumococcal polysaccharide (PPSV23) vaccine. The PPSV23 vaccine dose should be obtained at least 8 weeks after the dose of PCV13 vaccine. An adult aged 18 years or older who has certain medical conditions and previously received 1 or more doses of PPSV23 vaccine should receive 1 dose of PCV13. The PCV13 vaccine dose should be obtained 1 or more years after the last PPSV23 vaccine dose.    Pneumococcal polysaccharide (PPSV23) vaccine. When PCV13 is also indicated, PCV13 should be obtained first. All adults aged 40 years and older should be immunized. An adult younger than age 70 years who has certain medical conditions should be immunized. Any person who resides in a nursing home or long-term care facility should be immunized. An adult smoker should be immunized. People with an immunocompromised condition and certain other conditions should receive both PCV13 and PPSV23 vaccines. People with human immunodeficiency virus (HIV) infection should be immunized as soon as possible after diagnosis. Immunization during chemotherapy or radiation therapy should be avoided. Routine use of PPSV23 vaccine is not recommended for American Indians, [place] Natives, or people younger than 65 years unless there are medical conditions that require PPSV23 vaccine. When indicated,  people who have unknown immunization and have no record of immunization should receive PPSV23 vaccine. One-time revaccination 5 years after the first dose of PPSV23 is recommended for people aged 19-64 years who have chronic kidney failure, nephrotic syndrome, asplenia, or immunocompromised conditions. People who received 1-2 doses of PPSV23 before age 24 years should receive another dose of PPSV23 vaccine at age 34 years or later if at least 5 years have passed since the previous dose. Doses of PPSV23 are not needed for people immunized with PPSV23 at or after age 96 years.  Preventive Services / Frequency   Ages 37 to 29 years  Blood pressure check.  Lipid and cholesterol check.  Lung  cancer screening. / Every year if you are aged 74-80 years and have a 30-pack-year history of smoking and currently smoke or have quit within the past 15 years. Yearly screening is stopped once you have quit smoking for at least 15 years or develop a health problem that would prevent you from having lung cancer treatment.  Clinical breast exam.** / Every year after age 20 years.  BRCA-related cancer risk assessment.** / For women who have family members with a BRCA-related cancer (breast, ovarian, tubal, or peritoneal cancers).  Mammogram.** / Every year beginning at age 26 years and continuing for as long as you are in good health. Consult with your health care provider.  Pap test.** / Every 3 years starting at age 10 years through age 6 or 73 years with a history of 3 consecutive normal Pap tests.  HPV screening.** / Every 3 years from ages 32 years through ages 50 to 72 years with a history of 3 consecutive normal Pap tests.  Fecal occult blood test (FOBT) of stool. / Every year beginning at age 38 years and continuing until age 47 years. You may not need to do this test if you get a colonoscopy every 10 years.  Flexible sigmoidoscopy or colonoscopy.** / Every 5 years for a flexible sigmoidoscopy or  every 10 years for a colonoscopy beginning at age 64 years and continuing until age 71 years.  Hepatitis C blood test.** / For all people born from [birth year] through 1966 and any individual with known risks for hepatitis C.  Skin self-exam. / Monthly.  Influenza vaccine. / Every year.  Tetanus, diphtheria, and acellular pertussis (Tdap/Td) vaccine.** / Consult your health care provider. Pregnant women should receive 1 dose of Tdap vaccine during each pregnancy. 1 dose of Td every 10 years.  Varicella vaccine.** / Consult your health care provider. Pregnant females who do not have evidence of immunity should receive the first dose after pregnancy.  Zoster vaccine.** / 1 dose for adults aged 14 years or older.  Pneumococcal 13-valent conjugate (PCV13) vaccine.** / Consult your health care provider.  Pneumococcal polysaccharide (PPSV23) vaccine.** / 1 to 2 doses if you smoke cigarettes or if you have certain conditions.  Meningococcal vaccine.** / Consult your health care provider.  Hepatitis A vaccine.** / Consult your health care provider.  Hepatitis B vaccine.** / Consult your health care provider. Screening for abdominal aortic aneurysm (AAA)  by ultrasound is recommended for people over 50 who have history of high blood pressure or who are current or former smokers.

## 2015-04-02 NOTE — Progress Notes (Signed)
Patient ID: Tanya [name], female   DOB: 03/17/1961, 54 y.o.   MRN: [id number]  Complete Physical  Assessment and Plan:   1. Essential hypertension -keep BP log  -f/u BP 3 wks to a month -consider renal artery ultrasound - Urinalysis, Routine w reflex microscopic ([facility]) - Microalbumin / creatinine urine ratio - EKG 12-Lead - US, RETROPERITNL ABD,  LTD - TSH - cloNIDine (CATAPRES) 0.2 MG tablet; Take 1 tablet (0.2 mg total) by mouth 2 (two) times daily.  Dispense: 60 tablet; Refill: 11  2. OSA (obstructive sleep apnea) -cont diet and exercise  3. Diabetes mellitus without complication  - Hemoglobin A1c - Insulin, random  4. Hyperlipidemia -cont diet and exercise - Lipid panel  5. Vitamin D deficiency -cont supplement - Vit D  25 hydroxy (rtn osteoporosis monitoring)  6. Morbid obesity -diet and exercise  7. Screening for rectal cancer  - POC Hemoccult Bld/Stl (3-Cd Home Screen); Future  8. Medication management  - CBC with Differential/Platelet - BASIC METABOLIC PANEL WITH GFR - Hepatic function panel - Magnesium  9. Other fatigue  - Iron and TIBC - Vitamin B12    Discussed med's effects and SE's. Screening labs and tests as requested with regular follow-up as recommended.  HPI  54 y.o. female  presents for a complete physical.  Her blood pressure has not been controlled at home, today their BP is BP: (!) 182/100 mmHg.  She does workout.  She is starting to walk daily.  She denies chest pain, shortness of breath, dizziness.   She is not on cholesterol medication and denies myalgias. Her cholesterol is at goal. The cholesterol last visit was:  Lab Results  Component Value Date   CHOL 185 10/01/2014   HDL 96 10/01/2014   LDLCALC 70 10/01/2014   TRIG 94 10/01/2014   CHOLHDL 1.9 10/01/2014  .  She has been working on diet and exercise for diabetes, she is on bASA, she is on ACE/ARB and denies foot ulcerations, hyperglycemia, hypoglycemia ,  increased appetite, nausea, paresthesia of the feet, polydipsia, polyuria, visual disturbances, vomiting and weight loss. Last A1C in the office was:  Lab Results  Component Value Date   HGBA1C 6.9* 10/01/2014  She has a goal to get off the metformin.    Patient is on Vitamin D supplement.   Lab Results  Component Value Date   VD25OH 1981 10/01/2014     She does see Obgyn and sees Dr. Lorenso [name].    Current Medications:  Current Outpatient Prescriptions on File Prior to Visit  Medication Sig Dispense Refill  . aspirin EC 81 MG tablet Take 81 mg by mouth daily.      . B Complex-C (SUPER B COMPLEX PO) Take by mouth daily.    . bisoprolol-hydrochlorothiazide (ZIAC) 10-6.25 MG per tablet TAKE 1 TABLET BY MOUTH EVERY DAY 90 tablet 3  . Cholecalciferol (VITAMIN D PO) Take 5,000 Int'l Units by mouth daily. Takes three times weekly    . cloNIDine (CATAPRES) 0.1 MG tablet Take 1 tablet (0.1 mg total) by mouth 3 (three) times daily. 270 tablet 3  . furosemide (LASIX) 40 MG tablet TAKE 1 TABLET BY MOUTH TWICE A DAY FOR BP & FLUID 180 tablet 1  . losartan (COZAAR) 100 MG tablet Take 1 tablet (100 mg total) by mouth daily. 90 tablet 3  . Magnesium 250 MG TABS Take 250 mg by mouth daily.    . metFORMIN (GLUCOPHAGE-XR) 500 MG 24 hr tablet TAKE 2  TABLETS BY MOUTH TWICE DAILY 360 tablet 1  . Multiple Vitamins-Minerals (MULTIVITAMIN PO) Take by mouth daily.     No current facility-administered medications on file prior to visit.    Health Maintenance:   Immunization History  Administered Date(s) Administered  . Influenza Split 07/18/2013, 10/01/2014  . Pneumococcal Polysaccharide-23 06/16/1999  . Tdap 03/10/2009    Patient Care Team: Tanya Cowboy, MD as PCP - General (Internal Medicine)  Allergies:  Allergies  Allergen Reactions  . Naproxen Hives  . Meloxicam & Diet Manage Prod Rash    Medical History:  Past Medical History  Diagnosis Date  . Sarcoidosis   . Hypertension   .  Hyperlipidemia   . Diabetes mellitus   . Allergy   . OSA (obstructive sleep apnea)   . Goiter   . Sarcoid   . Vitamin D deficiency   . Morbid obesity 12/24/2013    Surgical History:  Past Surgical History  Procedure Laterality Date  . Eye surgery    . Achiles tendon      right  . Abdominal hysterectomy    . Tonsillectomy      Family History:  Family History  Problem Relation Age of Onset  . Colon cancer Neg Hx   . Esophageal cancer Neg Hx   . Rectal cancer Neg Hx   . Stomach cancer Neg Hx   . Heart disease Mother   . Diabetes Father   . Heart disease Father   . Diabetes Paternal Grandmother   . Heart disease Paternal Grandmother     Social History:  History  Substance Use Topics  . Smoking status: Never Smoker   . Smokeless tobacco: Never Used  . Alcohol Use: No    Review of Systems: Review of Systems  Constitutional: Negative for fever, chills, weight loss and malaise/fatigue.  HENT: Negative for congestion, ear pain, sore throat and tinnitus.   Eyes: Negative.   Respiratory: Negative for cough, shortness of breath and wheezing.   Cardiovascular: Negative for chest pain, palpitations and leg swelling.  Gastrointestinal: Negative for heartburn, nausea, vomiting, diarrhea, constipation, blood in stool and melena.  Genitourinary: Negative for dysuria, urgency and frequency.  Skin: Negative.   Neurological: Negative for dizziness, sensory change, loss of consciousness and headaches.  Psychiatric/Behavioral: Negative for depression. The patient is not nervous/anxious and does not have insomnia.     Physical Exam: Estimated body mass index is 40.02 kg/(m^2) as calculated from the following:   Height as of this encounter: 5' 4.25" (1.632 m).   Weight as of this encounter: 235 lb (106.595 kg). BP 182/100 mmHg  Pulse 60  Temp(Src) 98 F (36.7 C) (Temporal)  Resp 18  Ht 5' 4.25" (1.632 m)  Wt 235 lb (106.595 kg)  BMI 40.02 kg/m2  General Appearance: Well  nourished well developed, in no apparent distress.  Eyes: PERRLA, EOMs, conjunctiva no swelling or erythema ENT/Mouth: Ear canals normal without obstruction, swelling, erythema, or discharge.  TMs normal bilaterally with no erythema, bulging, retraction, or loss of landmark.  Oropharynx moist and clear with no exudate, erythema, or swelling.   Neck: Supple, thyroid normal. No bruits.  No cervical adenopathy Respiratory: Respiratory effort normal, Breath sounds clear A&P without wheeze, rhonchi, rales.   Cardio: RRR without murmurs, rubs or gallops. Brisk peripheral pulses without edema.  Chest: symmetric, with normal excursions Breasts: Symmetric, without lumps, nipple discharge, retractions.  Abdomen: Soft, nontender, no guarding, rebound, hernias, masses, or organomegaly.  Lymphatics: Non tender without lymphadenopathy.  Genitourinary:  Musculoskeletal: Full ROM all peripheral extremities,5/5 strength, and normal gait.  Skin: Warm, dry without rashes, lesions, ecchymosis. Neuro: Awake and oriented X 3, Cranial nerves intact, reflexes equal bilaterally. Normal muscle tone, no cerebellar symptoms. Sensation intact.  Psych:  normal affect, Insight and Judgment appropriate.   EKG: T wave inversion noted but appear to be stable from last year.    AORTA SCAN: WNL   Over 40 minutes of exam, counseling, chart review and critical decision making was performed  [name], Tanya Radick 3:39 PM [facility]

## 2015-04-03 ENCOUNTER — Other Ambulatory Visit: Payer: Self-pay | Admitting: Internal Medicine

## 2015-04-03 LAB — INSULIN, RANDOM: Insulin: 6.5 u[IU]/mL (ref 2.0–19.6)

## 2015-04-03 LAB — MICROALBUMIN / CREATININE URINE RATIO
Creatinine, Urine: 16.7 mg/dL
Microalb Creat Ratio: 35.9 mg/g — ABNORMAL HIGH (ref 0.0–30.0)
Microalb, Ur: 0.6 mg/dL (ref <2.0)

## 2015-04-03 LAB — URINALYSIS, ROUTINE W REFLEX MICROSCOPIC
BILIRUBIN URINE: NEGATIVE
Glucose, UA: NEGATIVE mg/dL
Hgb urine dipstick: NEGATIVE
KETONES UR: NEGATIVE mg/dL
LEUKOCYTES UA: NEGATIVE
Nitrite: NEGATIVE
PH: 6.5 (ref 5.0–8.0)
Protein, ur: NEGATIVE mg/dL
Specific Gravity, Urine: <1.005 — ABNORMAL LOW (ref 1.005–1.030)
UROBILINOGEN UA: 0.2 mg/dL (ref 0.0–1.0)

## 2015-04-03 LAB — VITAMIN B12: Vitamin B-12: 326 pg/mL (ref 211–911)

## 2015-04-03 LAB — HEMOGLOBIN A1C
HEMOGLOBIN A1C: 7.2 % — AB (ref <5.7)
Mean Plasma Glucose: 160 mg/dL — ABNORMAL HIGH (ref <117)

## 2015-04-03 LAB — VITAMIN D 25 HYDROXY (VIT D DEFICIENCY, FRACTURES): Vit D, 25-Hydroxy: 77 ng/mL (ref 30–100)

## 2015-04-03 LAB — TSH: TSH: 0.555 u[IU]/mL (ref 0.350–4.500)

## 2015-04-03 MED ORDER — DAPAGLIFLOZIN PROPANEDIOL 5 MG PO TABS: 5.0000 mg | ORAL_TABLET | Freq: Every day | ORAL | Status: DC

## 2015-04-23 ENCOUNTER — Encounter: Payer: Self-pay | Admitting: Internal Medicine

## 2015-04-23 ENCOUNTER — Ambulatory Visit (INDEPENDENT_AMBULATORY_CARE_PROVIDER_SITE_OTHER): Payer: 59 | Admitting: Internal Medicine

## 2015-04-23 VITALS — BP 176/82 | HR 64 | Temp 98.2°F | Resp 18 | Ht 64.0 in | Wt 235.0 lb

## 2015-04-23 DIAGNOSIS — I10 Essential (primary) hypertension: Secondary | ICD-10-CM

## 2015-04-23 MED ORDER — MINOXIDIL 10 MG PO TABS: 10.0000 mg | ORAL_TABLET | Freq: Every day | ORAL | Status: DC

## 2015-04-23 NOTE — Patient Instructions (Signed)
Minoxidil tablets What is this medicine? MINOXIDIL (mi NOX i dill) is a type of vasodilator. It relaxes blood vessels, increasing the blood and oxygen supply to your heart. This medicine is used to treat high blood pressure. It is not recommended for use in mild cases of high blood pressure because of the potential for serious side effects. This medicine may be used for other purposes; ask your health care provider or pharmacist if you have questions. COMMON BRAND NAME(S): Loniten What should I tell my health care provider before I take this medicine? They need to know if you have any of these conditions: -angina -heart or blood vessel disease -kidney disease -lung disease -pheochromocytoma -previous heart attack -an unusual or allergic reaction to minoxidil, other medicines, foods, dyes, or preservatives -pregnant or trying to get pregnant -breast-feeding How should I use this medicine? Take this medicine by mouth with a glass of water. Follow the directions on the prescription label. Take your doses at regular intervals. Do not take your medicine more often than directed. Do not stop taking except on the advice of your doctor or health care professional. Talk to your pediatrician regarding the use of this medicine in children. Special care may be needed. While this medicine may be prescribed for children for selected conditions, precautions do apply. Overdosage: If you think you have taken too much of this medicine contact a poison control center or emergency room at once. NOTE: This medicine is only for you. Do not share this medicine with others. What if I miss a dose? If you miss a dose, take it as soon as you can. If it is almost time for your next dose, take only that dose. Do not take double or extra doses. What may interact with this medicine? -medicines for chest pain -medicines for high blood pressure This list may not describe all possible interactions. Give your health care  provider a list of all the medicines, herbs, non-prescription drugs, or dietary supplements you use. Also tell them if you smoke, drink alcohol, or use illegal drugs. Some items may interact with your medicine. What should I watch for while using this medicine? Check your heart rate and blood pressure regularly while you are taking this medicine. Ask your doctor or health care professional what your heart rate and blood pressure should be and when you should contact him or her. While you are taking this medicine, keep a check on your weight. Tell your doctor or health care professional if you rapidly gain more then 5 pounds. You may get dizzy. Do not drive, use machinery, or do anything that needs mental alertness until you know how this medicine affects you. To reduce the risk of dizzy or fainting spells, do not sit or stand up quickly, especially if you are an older patient. Alcohol can make you more dizzy, and increase flushing and rapid heartbeats. Avoid alcoholic drinks. Your mouth may get dry. Chewing sugarless gum or sucking hard candy, and drinking plenty of water may help. Contact your doctor if the problem does not go away or is severe. Do not treat yourself for coughs, colds, or pain while you are taking this medicine without asking your doctor or health care professional for advice. Some ingredients may increase your blood pressure. You may need to follow a special low sodium diet while taking this medicine. Check with your doctor or health care professional. What side effects may I notice from receiving this medicine? Side effects that you should report to your  doctor or health care professional as soon as possible: -chest pain, fast or irregular heartbeat, palpitations -difficulty breathing -dizziness or fainting spells -redness, blistering, peeling or loosening of the skin, including inside the mouth -skin rash or itching -stiff or swollen joints -sudden weight gain -swelling of the  feet or legs -unusual weakness Side effects that usually do not require medical attention (report to your doctor or health care professional if they continue or are bothersome): -headache -unusual hair growth, on the face, arm, and back This list may not describe all possible side effects. Call your doctor for medical advice about side effects. You may report side effects to FDA at 1-800-FDA-1088. Where should I keep my medicine? Keep out of the reach of children. Store at room temperature between 20 and 25 degrees C (68 and 77 degrees F). Throw away any unused medicine after the expiration date. NOTE: This sheet is a summary. It may not cover all possible information. If you have questions about this medicine, talk to your doctor, pharmacist, or health care provider.  2015, Elsevier/Gold Standard. (2008-05-06 14:24:22)

## 2015-04-23 NOTE — Progress Notes (Signed)
   Subjective:    Patient ID: Tanya [name], female    DOB: 1961/05/31, 54 y.o.   MRN: [id number]  HPI  Patient reports to the office for recheck of elevated BP.  Patient reports that the new medication clonidine has been helping a little bit but her BP has been running 190/90 approximately even with this on board.  She reports that she has been cutting back on salt.  She has never had a renal ultrasound.  She reports that she is doing a lot of walking at work.  She reports that she has been increasing her steps so that she can get up to 56,000 steps per week.    Review of Systems  Constitutional: Negative for fever, chills and fatigue.  Eyes: Negative for visual disturbance.  Respiratory: Negative for chest tightness and shortness of breath.   Cardiovascular: Negative for chest pain and palpitations.  Genitourinary: Negative for difficulty urinating.  Neurological: Negative for dizziness, speech difficulty, weakness and headaches.  Psychiatric/Behavioral: Negative for confusion.       Objective:   Physical Exam  Constitutional: She is oriented to person, place, and time. She appears well-developed and well-nourished. No distress.  HENT:  Head: Normocephalic and atraumatic.  Mouth/Throat: Oropharynx is clear and moist. No oropharyngeal exudate.  Eyes: Conjunctivae are normal. No scleral icterus.  Neck: Normal range of motion. Neck supple. No JVD present. No thyromegaly present.  Cardiovascular: Normal rate, regular rhythm, normal heart sounds and intact distal pulses.  Exam reveals no gallop and no friction rub.   No murmur heard. Pulmonary/Chest: Effort normal and breath sounds normal. No respiratory distress. She has no wheezes. She has no rales. She exhibits no tenderness.  Abdominal: Soft. Bowel sounds are normal. She exhibits no distension and no mass. There is no tenderness. There is no rebound and no guarding.  Musculoskeletal: Normal range of motion.  Lymphadenopathy:    She  has no cervical adenopathy.  Neurological: She is alert and oriented to person, place, and time.  Skin: Skin is warm and dry. She is not diaphoretic.  Psychiatric: She has a normal mood and affect. Her behavior is normal. Judgment and thought content normal.  Nursing note and vitals reviewed.   Filed Vitals:   04/23/15 1134  BP: 176/82  Pulse: 64  Temp: 98.2 F (36.8 C)  Resp: 18         Assessment & Plan:    1. Essential hypertension -recheck in 1 month -cont diet and exercise -cont current meds - US Renal Artery Stenosis; Future - minoxidil (LONITEN) 10 MG tablet; Take 1 tablet (10 mg total) by mouth daily.  Dispense: 30 tablet; Refill: 1

## 2015-04-28 ENCOUNTER — Other Ambulatory Visit: Payer: Self-pay

## 2015-05-12 ENCOUNTER — Other Ambulatory Visit: Payer: Self-pay | Admitting: Internal Medicine

## 2015-05-23 ENCOUNTER — Other Ambulatory Visit: Payer: Self-pay | Admitting: *Deleted

## 2015-05-23 DIAGNOSIS — I10 Essential (primary) hypertension: Secondary | ICD-10-CM

## 2015-05-23 MED ORDER — CLONIDINE HCL 0.2 MG PO TABS: 0.2000 mg | ORAL_TABLET | Freq: Two times a day (BID) | ORAL | Status: DC

## 2015-05-27 ENCOUNTER — Other Ambulatory Visit: Payer: Self-pay | Admitting: Internal Medicine

## 2015-05-30 ENCOUNTER — Ambulatory Visit: Payer: Self-pay | Admitting: Internal Medicine

## 2015-06-05 ENCOUNTER — Ambulatory Visit (INDEPENDENT_AMBULATORY_CARE_PROVIDER_SITE_OTHER): Payer: 59 | Admitting: Internal Medicine

## 2015-06-05 ENCOUNTER — Other Ambulatory Visit: Payer: Self-pay

## 2015-06-05 ENCOUNTER — Encounter: Payer: Self-pay | Admitting: Internal Medicine

## 2015-06-05 VITALS — BP 198/88 | HR 60 | Temp 98.2°F | Resp 18 | Ht 64.0 in | Wt 234.0 lb

## 2015-06-05 DIAGNOSIS — I10 Essential (primary) hypertension: Secondary | ICD-10-CM

## 2015-06-05 DIAGNOSIS — R0989 Other specified symptoms and signs involving the circulatory and respiratory systems: Secondary | ICD-10-CM

## 2015-06-05 MED ORDER — AMLODIPINE BESYLATE 5 MG PO TABS: 5.0000 mg | ORAL_TABLET | Freq: Every day | ORAL | Status: DC

## 2015-06-05 NOTE — Patient Instructions (Signed)
Amlodipine tablets °What is this medicine? °AMLODIPINE (am LOE di peen) is a calcium-channel blocker. It affects the amount of calcium found in your heart and muscle cells. This relaxes your blood vessels, which can reduce the amount of work the heart has to do. This medicine is used to lower high blood pressure. It is also used to prevent chest pain. °This medicine may be used for other purposes; ask your health care provider or pharmacist if you have questions. °COMMON BRAND NAME(S): Norvasc °What should I tell my health care provider before I take this medicine? °They need to know if you have any of these conditions: °-heart problems like heart failure or aortic stenosis °-liver disease °-an unusual or allergic reaction to amlodipine, other medicines, foods, dyes, or preservatives °-pregnant or trying to get pregnant °-breast-feeding °How should I use this medicine? °Take this medicine by mouth with a glass of water. Follow the directions on the prescription label. Take your medicine at regular intervals. Do not take more medicine than directed. °Talk to your pediatrician regarding the use of this medicine in children. Special care may be needed. This medicine has been used in children as young as 6. °Persons over 65 years old may have a stronger reaction to this medicine and need smaller doses. °Overdosage: If you think you have taken too much of this medicine contact a poison control center or emergency room at once. °NOTE: This medicine is only for you. Do not share this medicine with others. °What if I miss a dose? °If you miss a dose, take it as soon as you can. If it is almost time for your next dose, take only that dose. Do not take double or extra doses. °What may interact with this medicine? °-herbal or dietary supplements °-local or general anesthetics °-medicines for high blood pressure °-medicines for prostate problems °-rifampin °This list may not describe all possible interactions. Give your health  care provider a list of all the medicines, herbs, non-prescription drugs, or dietary supplements you use. Also tell them if you smoke, drink alcohol, or use illegal drugs. Some items may interact with your medicine. °What should I watch for while using this medicine? °Visit your doctor or health care professional for regular check ups. Check your blood pressure and pulse rate regularly. Ask your health care professional what your blood pressure and pulse rate should be, and when you should contact him or her. °This medicine may make you feel confused, dizzy or lightheaded. Do not drive, use machinery, or do anything that needs mental alertness until you know how this medicine affects you. To reduce the risk of dizzy or fainting spells, do not sit or stand up quickly, especially if you are an older patient. Avoid alcoholic drinks; they can make you more dizzy. °Do not suddenly stop taking amlodipine. Ask your doctor or health care professional how you can gradually reduce the dose. °What side effects may I notice from receiving this medicine? °Side effects that you should report to your doctor or health care professional as soon as possible: °-allergic reactions like skin rash, itching or hives, swelling of the face, lips, or tongue °-breathing problems °-changes in vision or hearing °-chest pain °-fast, irregular heartbeat °-swelling of legs or ankles °Side effects that usually do not require medical attention (report to your doctor or health care professional if they continue or are bothersome): °-dry mouth °-facial flushing °-nausea, vomiting °-stomach gas, pain °-tired, weak °-trouble sleeping °This list may not describe all possible side   effects. Call your doctor for medical advice about side effects. You may report side effects to FDA at 1-800-FDA-1088. °Where should I keep my medicine? °Keep out of the reach of children. °Store at room temperature between 59 and 86 degrees F (15 and 30 degrees C). Protect from  light. Keep container tightly closed. Throw away any unused medicine after the expiration date. °NOTE: This sheet is a summary. It may not cover all possible information. If you have questions about this medicine, talk to your doctor, pharmacist, or health care provider. °© 2015, [employer]vier/Gold Standard. (2012-09-15 11:40:58) ° °

## 2015-06-05 NOTE — Progress Notes (Signed)
Patient ID: Tanya [name], female   DOB: 1961/05/27, 54 y.o.   MRN: [id number]  Assessment and Plan:   1. Labile hypertension -d/c minoxidil -start amlodipine -cont all other medications -monitor at home -cont diet and exercise -go to ER if CP, SOB, or weakness or any other concerning symptoms -renal artery ultrasound ordered and is getting precertified now     HPI 54 y.o.female presents for 1 month follow up of HTN.  She was last seen having excessively elevated blood pressure.  She reports that she tried taking the minoxidil and had some ankle swelling with the medication so she stopped. Patient reports that they have been doing well, but she does report that she checked blood pressure and it never got over 160/90s.  female is not taking their medication.  They are not having difficulty with their medications.  They report  adverse reactions with minoxidil.    She reports that she is still  Past Medical History  Diagnosis Date  . Sarcoidosis   . Hypertension   . Hyperlipidemia   . Diabetes mellitus   . Allergy   . OSA (obstructive sleep apnea)   . Goiter   . Sarcoid   . Vitamin D deficiency   . Morbid obesity 12/24/2013     Allergies  Allergen Reactions  . Naproxen Hives  . Meloxicam & Diet Manage Prod Rash      Current Outpatient Prescriptions on File Prior to Visit  Medication Sig Dispense Refill  . aspirin EC 81 MG tablet Take 81 mg by mouth daily.      . B Complex-C (SUPER B COMPLEX PO) Take by mouth daily.    . bisoprolol-hydrochlorothiazide (ZIAC) 10-6.25 MG per tablet TAKE 1 TABLET BY MOUTH EVERY DAY 90 tablet 3  . Cholecalciferol (VITAMIN D PO) Take 5,000 Int'l Units by mouth daily. Takes three times weekly    . cloNIDine (CATAPRES) 0.2 MG tablet Take 1 tablet (0.2 mg total) by mouth 2 (two) times daily. 180 tablet 2  . furosemide (LASIX) 40 MG tablet TAKE 1 TABLET BY MOUTH TWICE A DAY FOR BP & FLUID 180 tablet 1  . losartan (COZAAR) 100 MG tablet Take 1  tablet (100 mg total) by mouth daily. 90 tablet 3  . Magnesium 250 MG TABS Take 250 mg by mouth daily.    . metFORMIN (GLUCOPHAGE-XR) 500 MG 24 hr tablet TAKE 2 TABLETS BY MOUTH TWICE DAILY 360 tablet 1  . Multiple Vitamins-Minerals (MULTIVITAMIN PO) Take by mouth daily.    . Vitamin D, Ergocalciferol, (DRISDOL) 50000 UNITS CAPS capsule TAKE ONE CAPSULE EVERY DAY 90 capsule 2  . minoxidil (LONITEN) 10 MG tablet Take 1 tablet (10 mg total) by mouth daily. (Patient not taking: Reported on 06/05/2015) 30 tablet 1   No current facility-administered medications on file prior to visit.    ROS: No CP, Headaches, visual changes, shortness of breath, difficulty with urination, decreased urination, weakness, numbness or any other symptoms.     Physical Exam: Filed Weights   06/05/15 0901  Weight: 234 lb (106.142 kg)   BP 198/88 mmHg  Pulse 60  Temp(Src) 98.2 F (36.8 C) (Temporal)  Resp 18  Ht 5\' 4"  (1.626 m)  Wt 234 lb (106.142 kg)  BMI 40.15 kg/m2 General Appearance: Well developed well nourished, non-toxic appearing in no apparent distress. Eyes: PERRLA, EOMs, conjunctiva w/ no swelling or erythema or discharge Sinuses: No Frontal/maxillary tenderness ENT/Mouth: Ear canals clear without swelling or erythema.  TM's normal bilaterally  with no retractions, bulging, or loss of landmarks.   Neck: Supple, thyroid normal, no notable JVD  Respiratory: Respiratory effort normal, Clear breath sounds anteriorly and posteriorly bilaterally without rales, rhonchi, wheezing or stridor. No retractions or accessory muscle usage. Cardio: RRR with no MRGs.   Abdomen: Soft, + BS.  Non tender, no guarding, rebound, hernias, masses.  Musculoskeletal: Full ROM, 5/5 strength, normal gait.  Skin: Warm, dry without rashes  Neuro: Awake and oriented X 3, Cranial nerves intact. Normal muscle tone, no cerebellar symptoms. Sensation intact.  Psych: normal affect, Insight and Judgment appropriate.     Terri Piedra, PA-C 9:19 AM [facility]

## 2015-06-09 ENCOUNTER — Ambulatory Visit
Admission: RE | Admit: 2015-06-09 | Discharge: 2015-06-09 | Disposition: A | Discharge status: Home / Self Care | Payer: 59 | Source: Ambulatory Visit | Attending: Internal Medicine | Admitting: Internal Medicine

## 2015-06-09 DIAGNOSIS — I10 Essential (primary) hypertension: Secondary | ICD-10-CM

## 2015-06-26 ENCOUNTER — Ambulatory Visit (INDEPENDENT_AMBULATORY_CARE_PROVIDER_SITE_OTHER): Payer: 59 | Admitting: Internal Medicine

## 2015-06-26 ENCOUNTER — Encounter: Payer: Self-pay | Admitting: Internal Medicine

## 2015-06-26 VITALS — BP 196/88 | HR 76 | Temp 98.4°F | Resp 18 | Ht 64.0 in | Wt 229.0 lb

## 2015-06-26 DIAGNOSIS — L0231 Cutaneous abscess of buttock: Secondary | ICD-10-CM | POA: Diagnosis not present

## 2015-06-26 MED ORDER — DOXYCYCLINE HYCLATE 100 MG PO CAPS: 100.0000 mg | ORAL_CAPSULE | Freq: Two times a day (BID) | ORAL | Status: DC

## 2015-06-26 MED ORDER — AMLODIPINE BESYLATE 10 MG PO TABS: 10.0000 mg | ORAL_TABLET | Freq: Every day | ORAL | Status: DC

## 2015-06-26 NOTE — Patient Instructions (Signed)
Please start taking 10 mg of the amlodipine daily.  You can continue to take it at the same time you are currently taking it.  Please continue to use warm compresses on the abscesses on your buttocks.  If you experience fevers, chills, increased redness or warmth despite taking the antibiotics let me know.    Abscess An abscess is an infected area that contains a collection of pus and debris.It can occur in almost any part of the body. An abscess is also known as a furuncle or boil. CAUSES  An abscess occurs when tissue gets infected. This can occur from blockage of oil or sweat glands, infection of hair follicles, or a minor injury to the skin. As the body tries to fight the infection, pus collects in the area and creates pressure under the skin. This pressure causes pain. People with weakened immune systems have difficulty fighting infections and get certain abscesses more often.  SYMPTOMS Usually an abscess develops on the skin and becomes a painful mass that is red, warm, and tender. If the abscess forms under the skin, you may feel a moveable soft area under the skin. Some abscesses break open (rupture) on their own, but most will continue to get worse without care. The infection can spread deeper into the body and eventually into the bloodstream, causing you to feel ill.  DIAGNOSIS  Your caregiver will take your medical history and perform a physical exam. A sample of fluid may also be taken from the abscess to determine what is causing your infection. TREATMENT  Your caregiver may prescribe antibiotic medicines to fight the infection. However, taking antibiotics alone usually does not cure an abscess. Your caregiver may need to make a small cut (incision) in the abscess to drain the pus. In some cases, gauze is packed into the abscess to reduce pain and to continue draining the area. HOME CARE INSTRUCTIONS   Only take over-the-counter or prescription medicines for pain, discomfort, or fever  as directed by your caregiver.  If you were prescribed antibiotics, take them as directed. Finish them even if you start to feel better.  If gauze is used, follow your caregiver's directions for changing the gauze.  To avoid spreading the infection:  Keep your draining abscess covered with a bandage.  Wash your hands well.  Do not share personal care items, towels, or whirlpools with others.  Avoid skin contact with others.  Keep your skin and clothes clean around the abscess.  Keep all follow-up appointments as directed by your caregiver. SEEK MEDICAL CARE IF:   You have increased pain, swelling, redness, fluid drainage, or bleeding.  You have muscle aches, chills, or a general ill feeling.  You have a fever. MAKE SURE YOU:   Understand these instructions.  Will watch your condition.  Will get help right away if you are not doing well or get worse. Document Released: 07/28/2005 Document Revised: 04/18/2012 Document Reviewed: 12/31/2011 [facility] Patient Information 2015 [place], [place]. This information is not intended to replace advice given to you by your health care provider. Make sure you discuss any questions you have with your health care provider.

## 2015-06-26 NOTE — Progress Notes (Signed)
Patient ID: Tanya [name], female   DOB: [birth year], 54 y.o.   MRN: [id number]  Assessment and Plan:   1. Abscess of buttock -doxycycline -warm compresses -call office if no improvement and consider I&D  2.  HTN -increase amlodipine to 10 mg daily -cont other medications  HPI 54 y.o.female presents for 3 week follow up of hypertension and starting amlodipine. Patient reports that they have been doing well.  Blood pressures at home have continued to be greater than 160/90.  female is taking their medication.  They are not having difficulty with their medications.  They report no adverse reactions.  She also reports that she has had multiple abscesses on her buttocks and they have been there for a couple weeks.  She reports that they have been unchanged.  She has had abscesses in this location in the past.    Past Medical History  Diagnosis Date  . Sarcoidosis   . Hypertension   . Hyperlipidemia   . Diabetes mellitus   . Allergy   . OSA (obstructive sleep apnea)   . Goiter   . Sarcoid   . Vitamin D deficiency   . Morbid obesity 12/24/2013     Allergies  Allergen Reactions  . Naproxen Hives  . Meloxicam & Diet Manage Prod Rash      Current Outpatient Prescriptions on File Prior to Visit  Medication Sig Dispense Refill  . amLODipine (NORVASC) 5 MG tablet Take 1 tablet (5 mg total) by mouth daily. 30 tablet 1  . aspirin EC 81 MG tablet Take 81 mg by mouth daily.      . B Complex-C (SUPER B COMPLEX PO) Take by mouth daily.    . bisoprolol-hydrochlorothiazide (ZIAC) 10-6.25 MG per tablet TAKE 1 TABLET BY MOUTH EVERY DAY 90 tablet 3  . Cholecalciferol (VITAMIN D PO) Take 5,000 Int'l Units by mouth daily. Takes three times weekly    . cloNIDine (CATAPRES) 0.2 MG tablet Take 1 tablet (0.2 mg total) by mouth 2 (two) times daily. 180 tablet 2  . furosemide (LASIX) 40 MG tablet TAKE 1 TABLET BY MOUTH TWICE A DAY FOR BP & FLUID 180 tablet 1  . losartan (COZAAR) 100 MG tablet Take 1  tablet (100 mg total) by mouth daily. 90 tablet 3  . Magnesium 250 MG TABS Take 250 mg by mouth daily.    . metFORMIN (GLUCOPHAGE-XR) 500 MG 24 hr tablet TAKE 2 TABLETS BY MOUTH TWICE DAILY 360 tablet 1  . Multiple Vitamins-Minerals (MULTIVITAMIN PO) Take by mouth daily.    . Vitamin D, Ergocalciferol, (DRISDOL) 50000 UNITS CAPS capsule TAKE ONE CAPSULE EVERY DAY 90 capsule 2   No current facility-administered medications on file prior to visit.    ROS: all negative except above.   Physical Exam: Filed Weights   06/26/15 1636  Weight: 229 lb (103.874 kg)   Wt Readings from Last 3 Encounters:  06/26/15 229 lb (103.874 kg)  06/05/15 234 lb (106.142 kg)  04/23/15 235 lb (106.595 kg)    BP 196/88 mmHg  Pulse 76  Temp(Src) 98.4 F (36.9 C) (Temporal)  Resp 18  Ht 5\' 4"  (1.626 m)  Wt 229 lb (103.874 kg)  BMI 39.29 kg/m2 General Appearance: Well developed well nourished, non-toxic appearing in no apparent distress. Eyes: PERRLA, EOMs, conjunctiva w/ no swelling or erythema or discharge Sinuses: No Frontal/maxillary tenderness ENT/Mouth: Ear canals clear without swelling or erythema.  TM's normal bilaterally with no retractions, bulging, or loss of landmarks.  Neck: Supple, thyroid normal, no notable JVD  Respiratory: Respiratory effort normal, Clear breath sounds anteriorly and posteriorly bilaterally without rales, rhonchi, wheezing or stridor. No retractions or accessory muscle usage. Cardio: RRR with no MRGs.   Abdomen: Soft, + BS.  Non tender, no guarding, rebound, hernias, masses.  Musculoskeletal: Full ROM, 5/5 strength, normal gait.  Skin: Warm, dry without rashes.  Multiple small indurated abscesses to the buttocks without fluctuance or surrounding redness. Neuro: Awake and oriented X 3, Cranial nerves intact. Normal muscle tone, no cerebellar symptoms. Sensation intact.  Psych: normal affect, Insight and Judgment appropriate.     Terri Piedra, PA-C 5:18  PM [facility]

## 2015-09-27 ENCOUNTER — Other Ambulatory Visit: Payer: Self-pay | Admitting: Internal Medicine

## 2015-10-22 ENCOUNTER — Other Ambulatory Visit: Payer: Self-pay | Admitting: Internal Medicine

## 2015-11-11 ENCOUNTER — Other Ambulatory Visit: Payer: Self-pay | Admitting: Internal Medicine

## 2015-11-14 ENCOUNTER — Other Ambulatory Visit: Payer: Self-pay | Admitting: Internal Medicine

## 2016-01-01 ENCOUNTER — Encounter: Payer: Self-pay | Admitting: Physician Assistant

## 2016-01-24 ENCOUNTER — Other Ambulatory Visit: Payer: Self-pay | Admitting: Internal Medicine

## 2016-01-24 DIAGNOSIS — I10 Essential (primary) hypertension: Secondary | ICD-10-CM

## 2016-02-08 ENCOUNTER — Other Ambulatory Visit: Payer: Self-pay | Admitting: Internal Medicine

## 2016-03-29 ENCOUNTER — Other Ambulatory Visit: Payer: Self-pay | Admitting: Internal Medicine

## 2016-04-01 ENCOUNTER — Encounter: Payer: Self-pay | Admitting: Internal Medicine

## 2016-04-05 ENCOUNTER — Encounter: Payer: Self-pay | Admitting: Internal Medicine

## 2016-04-06 ENCOUNTER — Other Ambulatory Visit: Payer: Self-pay | Admitting: Internal Medicine

## 2016-05-06 ENCOUNTER — Other Ambulatory Visit: Payer: Self-pay | Admitting: *Deleted

## 2016-05-06 MED ORDER — METFORMIN HCL ER 500 MG PO TB24: 1000.0000 mg | ORAL_TABLET | Freq: Two times a day (BID) | ORAL | Status: DC

## 2016-05-10 ENCOUNTER — Other Ambulatory Visit: Payer: Self-pay

## 2016-05-10 MED ORDER — FUROSEMIDE 40 MG PO TABS: ORAL_TABLET | ORAL | Status: DC

## 2016-05-18 ENCOUNTER — Ambulatory Visit (INDEPENDENT_AMBULATORY_CARE_PROVIDER_SITE_OTHER): Payer: 59 | Admitting: Internal Medicine

## 2016-05-18 ENCOUNTER — Encounter: Payer: Self-pay | Admitting: Internal Medicine

## 2016-05-18 VITALS — BP 162/66 | HR 48 | Temp 98.0°F | Resp 16 | Ht 64.0 in | Wt 226.0 lb

## 2016-05-18 DIAGNOSIS — Z130 Encounter for screening for diseases of the blood and blood-forming organs and certain disorders involving the immune mechanism: Secondary | ICD-10-CM

## 2016-05-18 DIAGNOSIS — Z136 Encounter for screening for cardiovascular disorders: Secondary | ICD-10-CM

## 2016-05-18 DIAGNOSIS — Z0001 Encounter for general adult medical examination with abnormal findings: Secondary | ICD-10-CM

## 2016-05-18 DIAGNOSIS — I10 Essential (primary) hypertension: Secondary | ICD-10-CM | POA: Diagnosis not present

## 2016-05-18 DIAGNOSIS — Z0000 Encounter for general adult medical examination without abnormal findings: Secondary | ICD-10-CM

## 2016-05-18 DIAGNOSIS — E559 Vitamin D deficiency, unspecified: Secondary | ICD-10-CM

## 2016-05-18 DIAGNOSIS — E785 Hyperlipidemia, unspecified: Secondary | ICD-10-CM

## 2016-05-18 DIAGNOSIS — Z1212 Encounter for screening for malignant neoplasm of rectum: Secondary | ICD-10-CM

## 2016-05-18 DIAGNOSIS — E119 Type 2 diabetes mellitus without complications: Secondary | ICD-10-CM

## 2016-05-18 LAB — LIPID PANEL
Cholesterol: 175 mg/dL (ref 125–200)
HDL: 105 mg/dL (ref >=46)
LDL CALC: 58 mg/dL (ref <130)
TRIGLYCERIDES: 59 mg/dL (ref <150)
Total CHOL/HDL Ratio: 1.7 Ratio (ref <=5.0)
VLDL: 12 mg/dL (ref <30)

## 2016-05-18 LAB — CBC WITH DIFFERENTIAL/PLATELET
BASOS ABS: 50 {cells}/uL (ref 0–200)
BASOS PCT: 1 %
EOS ABS: 50 {cells}/uL (ref 15–500)
Eosinophils Relative: 1 %
HCT: 38.7 % (ref 35.0–45.0)
Hemoglobin: 12.5 g/dL (ref 11.7–15.5)
LYMPHS PCT: 52 %
Lymphs Abs: 2600 cells/uL (ref 850–3900)
MCH: 25.7 pg — AB (ref 27.0–33.0)
MCHC: 32.3 g/dL (ref 32.0–36.0)
MCV: 79.5 fL — AB (ref 80.0–100.0)
MONOS PCT: 6 %
MPV: 10.4 fL (ref 7.5–12.5)
Monocytes Absolute: 300 cells/uL (ref 200–950)
NEUTROS PCT: 40 %
Neutro Abs: 2000 cells/uL (ref 1500–7800)
PLATELETS: 453 10*3/uL — AB (ref 140–400)
RBC: 4.87 MIL/uL (ref 3.80–5.10)
RDW: 15.8 % — AB (ref 11.0–15.0)
WBC: 5 10*3/uL (ref 3.8–10.8)

## 2016-05-18 LAB — BASIC METABOLIC PANEL WITH GFR
BUN: 13 mg/dL (ref 7–25)
CALCIUM: 9.4 mg/dL (ref 8.6–10.4)
CO2: 31 mmol/L (ref 20–31)
CREATININE: 0.65 mg/dL (ref 0.50–1.05)
Chloride: 98 mmol/L (ref 98–110)
GFR, Est Non African American: >89 mL/min (ref >=60)
GLUCOSE: 119 mg/dL — AB (ref 65–99)
POTASSIUM: 3.9 mmol/L (ref 3.5–5.3)
Sodium: 137 mmol/L (ref 135–146)

## 2016-05-18 LAB — VITAMIN B12: Vitamin B-12: 288 pg/mL (ref 200–1100)

## 2016-05-18 LAB — HEPATIC FUNCTION PANEL
ALBUMIN: 4.2 g/dL (ref 3.6–5.1)
ALK PHOS: 57 U/L (ref 33–130)
ALT: 19 U/L (ref 6–29)
AST: 21 U/L (ref 10–35)
BILIRUBIN TOTAL: 0.4 mg/dL (ref 0.2–1.2)
Bilirubin, Direct: 0.1 mg/dL (ref <=0.2)
Indirect Bilirubin: 0.3 mg/dL (ref 0.2–1.2)
TOTAL PROTEIN: 6.9 g/dL (ref 6.1–8.1)

## 2016-05-18 LAB — IRON AND TIBC
%SAT: 14 % (ref 11–50)
IRON: 55 ug/dL (ref 45–160)
TIBC: 380 ug/dL (ref 250–450)
UIBC: 325 ug/dL (ref 125–400)

## 2016-05-18 LAB — TSH: TSH: 0.4 m[IU]/L

## 2016-05-18 LAB — MAGNESIUM: MAGNESIUM: 1.8 mg/dL (ref 1.5–2.5)

## 2016-05-18 NOTE — Progress Notes (Signed)
Complete Physical  Assessment and Plan:   1. Encounter for general adult medical examination with abnormal findings  - CBC with Differential/Platelet - BASIC METABOLIC PANEL WITH GFR - Hepatic function panel - Magnesium  2. Hyperlipidemia -diet and exercise -if elevated consider adding statin to get to goal - Lipid panel  3. Type 2 diabetes mellitus without complication, without long-term current use of insulin (HCC) -cont meds -encouraged to monitor CBGs at home so we can monitor her treatment - Hemoglobin A1c - Insulin, random  4. Screening for deficiency anemia  - Iron and TIBC - Vitamin B12  5. Essential hypertension  - Urinalysis, Routine w reflex microscopic (not at [facility]) - Microalbumin / creatinine urine ratio - EKG 12-Lead - [place], RETROPERITNL ABD,  LTD - TSH  6. Vitamin D deficiency  - VITAMIN D 25 Hydroxy (Vit-D Deficiency, Fractures)  7. Screening for rectal cancer  - POC Hemoccult Bld/Stl (3-Cd Home Screen); Future    Discussed med's effects and SE's. Screening labs and tests as requested with regular follow-up as recommended.  HPI  55 y.o. female  presents for a complete physical.  Her blood pressure has been controlled at home, today their BP is BP: (!) 162/66 mmHg.  She does workout.  She does not check it at home.   She denies chest pain, shortness of breath, dizziness. She has been walking a lot with her girlfriends at work.  She reports that she does have a blister from the walking.    She is on cholesterol medication and denies myalgias. Her cholesterol is at goal. The cholesterol last visit was:  Lab Results  Component Value Date   CHOL 171 04/02/2015   HDL 95 04/02/2015   LDLCALC 58 04/02/2015   TRIG 90 04/02/2015   CHOLHDL 1.8 04/02/2015  .  She has been working on diet and exercise for diabetes, she is on bASA, she is on ACE/ARB and denies foot ulcerations, hyperglycemia, hypoglycemia , increased appetite, nausea, paresthesia of the  feet, polydipsia, polyuria, visual disturbances, vomiting and weight loss. Last A1C in the office was:  Lab Results  Component Value Date   HGBA1C 7.2* 04/02/2015  She reports that she does struggle a little with starches.    Patient is on Vitamin D supplement.   Lab Results  Component Value Date   VD25OH 77 04/02/2015   She reports that she has not been to obgyn.  She has not been in about 3 years.  She reports that her former provider passed away.  She used to go to Dr. Lorenso Courier.   She has had her eyes checked this year.  She does go to [facility].    Current Medications:  Current Outpatient Prescriptions on File Prior to Visit  Medication Sig Dispense Refill  . aspirin EC 81 MG tablet Take 81 mg by mouth daily.      . B Complex-C (SUPER B COMPLEX PO) Take by mouth daily.    . bisoprolol-hydrochlorothiazide (ZIAC) 10-6.25 MG per tablet TAKE 1 TABLET BY MOUTH EVERY DAY 90 tablet 3  . Cholecalciferol (VITAMIN D PO) Take 5,000 Int'l Units by mouth daily. Takes three times weekly    . cloNIDine (CATAPRES) 0.2 MG tablet TAKE 1 TABLET (0.2 MG TOTAL) BY MOUTH 2 (TWO) TIMES DAILY. 180 tablet 0  . furosemide (LASIX) 40 MG tablet TAKE 1 TABLET BY MOUTH TWICE A DAY FOR BLOOD PRESSURE AND FLUID 30 tablet 0  . losartan (COZAAR) 100 MG tablet TAKE 1 TABLET (100  MG TOTAL) BY MOUTH DAILY. 90 tablet 3  . Magnesium 250 MG TABS Take 250 mg by mouth daily.    . metFORMIN (GLUCOPHAGE-XR) 500 MG 24 hr tablet Take 2 tablets (1,000 mg total) by mouth 2 (two) times daily. 120 tablet 0  . Multiple Vitamins-Minerals (MULTIVITAMIN PO) Take by mouth daily.    . Vitamin D, Ergocalciferol, (DRISDOL) 50000 units CAPS capsule TAKE ONE CAPSULE BY MOUTH DAILY 90 capsule 0   No current facility-administered medications on file prior to visit.    Health Maintenance:   Immunization History  Administered Date(s) Administered  . Influenza Split 07/18/2013, 10/01/2014  . Pneumococcal Polysaccharide-23 06/16/1999  .  Tdap 03/10/2009    Tetanus: 2010 Pneumovax: 2000 Flu vaccine:2015 LMP:  Hysterectomy MGM:  2014 Colonoscopy: 2014  Patient Care Team: Lucky Cowboy, MD as PCP - General (Internal Medicine)  Allergies:  Allergies  Allergen Reactions  . Naproxen Hives  . Meloxicam & Diet Manage Prod Rash    Medical History:  Past Medical History  Diagnosis Date  . Sarcoidosis (HCC)   . Hypertension   . Hyperlipidemia   . Diabetes mellitus   . Allergy   . OSA (obstructive sleep apnea)   . Goiter   . Sarcoid (HCC)   . Vitamin D deficiency   . Morbid obesity (HCC) 12/24/2013    Surgical History:  Past Surgical History  Procedure Laterality Date  . Eye surgery    . Achiles tendon      right  . Abdominal hysterectomy    . Tonsillectomy      Family History:  Family History  Problem Relation Age of Onset  . Colon cancer Neg Hx   . Esophageal cancer Neg Hx   . Rectal cancer Neg Hx   . Stomach cancer Neg Hx   . Heart disease Mother   . Diabetes Father   . Heart disease Father   . Diabetes Paternal Grandmother   . Heart disease Paternal Grandmother     Social History:  Social History  Substance Use Topics  . Smoking status: Never Smoker   . Smokeless tobacco: Never Used  . Alcohol Use: No    Review of Systems: Review of Systems  Constitutional: Negative for fever, chills and malaise/fatigue.  HENT: Negative for congestion, ear pain and sore throat.   Eyes: Negative.   Respiratory: Negative for cough, shortness of breath and wheezing.   Cardiovascular: Negative for chest pain, palpitations and leg swelling.  Gastrointestinal: Negative for heartburn, abdominal pain, diarrhea, constipation, blood in stool and melena.  Genitourinary: Negative.   Skin: Negative.   Neurological: Negative for dizziness, sensory change, loss of consciousness and headaches.  Psychiatric/Behavioral: Negative for depression. The patient is not nervous/anxious and does not have insomnia.      Physical Exam: Estimated body mass index is 38.77 kg/(m^2) as calculated from the following:   Height as of this encounter:  (1.626 m).   Weight as of this encounter: 226 lb (102.513 kg). BP 162/66 mmHg  Pulse 48  Temp(Src) 98 F (36.7 C) (Temporal)  Resp 16  Ht  (1.626 m)  Wt 226 lb (102.513 kg)  BMI 38.77 kg/m2  General Appearance: Well nourished well developed, in no apparent distress.  Eyes: PERRLA, EOMs, conjunctiva no swelling or erythema ENT/Mouth: Ear canals normal without obstruction, swelling, erythema, or discharge.  TMs normal bilaterally with no erythema, bulging, retraction, or loss of landmark.  Oropharynx moist and clear with no exudate, erythema, or swelling.  Neck: Supple, thyroid normal. No bruits.  No cervical adenopathy Respiratory: Respiratory effort normal, Breath sounds clear A&P without wheeze, rhonchi, rales.   Cardio: RRR without murmurs, rubs or gallops. Brisk peripheral pulses without edema.  Chest: symmetric, with normal excursions Breasts: Symmetric, without lumps, nipple discharge, retractions.  Abdomen: Soft, nontender, no guarding, rebound, hernias, masses, or organomegaly.  Lymphatics: Non tender without lymphadenopathy.  Genitourinary:  Musculoskeletal: Full ROM all peripheral extremities,5/5 strength, and normal gait.  Skin: Warm, dry without rashes, lesions, ecchymosis. Neuro: Awake and oriented X 3, Cranial nerves intact, reflexes equal bilaterally. Normal muscle tone, no cerebellar symptoms. Sensation intact.  Psych:  normal affect, Insight and Judgment appropriate.   EKG: WNL with sinus brady.  She does have prolonged QT which has not changed.  ? Due to beta blocker  AORTA SCAN: WNL   Over 40 minutes of exam, counseling, chart review and critical decision making was performed  Toni Amendourtney Forcucci 9:45 AM [id number] [facility]

## 2016-05-19 LAB — URINALYSIS, ROUTINE W REFLEX MICROSCOPIC
BILIRUBIN URINE: NEGATIVE
GLUCOSE, UA: NEGATIVE
HGB URINE DIPSTICK: NEGATIVE
KETONES UR: NEGATIVE
Leukocytes, UA: NEGATIVE
NITRITE: NEGATIVE
PH: 7 (ref 5.0–8.0)
Protein, ur: NEGATIVE
SPECIFIC GRAVITY, URINE: 1.01 (ref 1.001–1.035)

## 2016-05-19 LAB — VITAMIN D 25 HYDROXY (VIT D DEFICIENCY, FRACTURES): Vit D, 25-Hydroxy: 118 ng/mL — ABNORMAL HIGH (ref 30–100)

## 2016-05-19 LAB — HEMOGLOBIN A1C
Hgb A1c MFr Bld: 6.9 % — ABNORMAL HIGH (ref <5.7)
Mean Plasma Glucose: 151 mg/dL

## 2016-05-19 LAB — MICROALBUMIN / CREATININE URINE RATIO
Creatinine, Urine: 36 mg/dL (ref 20–320)
Microalb Creat Ratio: 25 mcg/mg creat (ref <30)
Microalb, Ur: 0.9 mg/dL

## 2016-05-19 LAB — INSULIN, RANDOM: INSULIN: 5.4 u[IU]/mL (ref 2.0–19.6)

## 2016-05-20 ENCOUNTER — Other Ambulatory Visit: Payer: Self-pay | Admitting: Internal Medicine

## 2016-05-25 ENCOUNTER — Other Ambulatory Visit: Payer: Self-pay | Admitting: Internal Medicine

## 2016-05-31 ENCOUNTER — Other Ambulatory Visit: Payer: Self-pay | Admitting: Physician Assistant

## 2016-06-10 ENCOUNTER — Other Ambulatory Visit: Payer: Self-pay | Admitting: Internal Medicine

## 2016-06-11 ENCOUNTER — Other Ambulatory Visit: Payer: Self-pay | Admitting: Physician Assistant

## 2016-06-15 ENCOUNTER — Telehealth: Payer: Self-pay | Admitting: *Deleted

## 2016-06-15 NOTE — Telephone Encounter (Signed)
CALLED PT 2 SCHEDULE A FOLLOW UP IN 

## 2016-06-23 ENCOUNTER — Other Ambulatory Visit: Payer: Self-pay | Admitting: Physician Assistant

## 2016-07-25 ENCOUNTER — Encounter (HOSPITAL_COMMUNITY): Payer: Self-pay | Admitting: Emergency Medicine

## 2016-07-25 ENCOUNTER — Emergency Department (HOSPITAL_COMMUNITY)
Admission: EM | Admit: 2016-07-25 | Discharge: 2016-07-25 | Disposition: A | Discharge status: Home / Self Care | Payer: 59 | Attending: Emergency Medicine | Admitting: Emergency Medicine

## 2016-07-25 ENCOUNTER — Emergency Department (HOSPITAL_COMMUNITY): Payer: 59

## 2016-07-25 DIAGNOSIS — Z7984 Long term (current) use of oral hypoglycemic drugs: Secondary | ICD-10-CM | POA: Diagnosis not present

## 2016-07-25 DIAGNOSIS — R002 Palpitations: Secondary | ICD-10-CM

## 2016-07-25 DIAGNOSIS — Z79899 Other long term (current) drug therapy: Secondary | ICD-10-CM | POA: Diagnosis not present

## 2016-07-25 DIAGNOSIS — Z7982 Long term (current) use of aspirin: Secondary | ICD-10-CM | POA: Insufficient documentation

## 2016-07-25 DIAGNOSIS — E119 Type 2 diabetes mellitus without complications: Secondary | ICD-10-CM | POA: Diagnosis not present

## 2016-07-25 DIAGNOSIS — I10 Essential (primary) hypertension: Secondary | ICD-10-CM | POA: Diagnosis not present

## 2016-07-25 LAB — BASIC METABOLIC PANEL
ANION GAP: 10 (ref 5–15)
BUN: 16 mg/dL (ref 6–20)
CALCIUM: 9.4 mg/dL (ref 8.9–10.3)
CHLORIDE: 99 mmol/L — AB (ref 101–111)
CO2: 30 mmol/L (ref 22–32)
Creatinine, Ser: 0.61 mg/dL (ref 0.44–1.00)
GFR calc non Af Amer: >60 mL/min (ref >60)
GLUCOSE: 148 mg/dL — AB (ref 65–99)
POTASSIUM: 3.6 mmol/L (ref 3.5–5.1)
Sodium: 139 mmol/L (ref 135–145)

## 2016-07-25 LAB — CBC WITH DIFFERENTIAL/PLATELET
BASOS ABS: 0 10*3/uL (ref 0.0–0.1)
BASOS PCT: 0 %
Eosinophils Absolute: 0.1 10*3/uL (ref 0.0–0.7)
Eosinophils Relative: 0 %
HEMATOCRIT: 37 % (ref 36.0–46.0)
HEMOGLOBIN: 11.9 g/dL — AB (ref 12.0–15.0)
LYMPHS PCT: 34 %
Lymphs Abs: 4.8 10*3/uL — ABNORMAL HIGH (ref 0.7–4.0)
MCH: 25.6 pg — ABNORMAL LOW (ref 26.0–34.0)
MCHC: 32.2 g/dL (ref 30.0–36.0)
MCV: 79.6 fL (ref 78.0–100.0)
MONO ABS: 0.9 10*3/uL (ref 0.1–1.0)
Monocytes Relative: 7 %
NEUTROS ABS: 8.4 10*3/uL — AB (ref 1.7–7.7)
NEUTROS PCT: 59 %
Platelets: 420 10*3/uL — ABNORMAL HIGH (ref 150–400)
RBC: 4.65 MIL/uL (ref 3.87–5.11)
RDW: 14.8 % (ref 11.5–15.5)
WBC: 14.2 10*3/uL — AB (ref 4.0–10.5)

## 2016-07-25 LAB — I-STAT TROPONIN, ED: Troponin i, poc: 0.02 ng/mL (ref 0.00–0.08)

## 2016-07-25 LAB — TSH: TSH: 0.992 u[IU]/mL (ref 0.350–4.500)

## 2016-07-25 MED ORDER — CLONIDINE HCL 0.2 MG PO TABS
0.2000 mg | ORAL_TABLET | Freq: Once | ORAL | Status: AC
  Administered 2016-07-25: 0.2 mg via ORAL
  Filled 2016-07-25: qty 1

## 2016-07-25 NOTE — ED Triage Notes (Signed)
Pt in EMS from home, reports short episode of chest palpitations. Pt HR 70-80's entire time with EMS. Hypertensive initially 260/110, last BP 198/91. Denies CP, SOB. A/OX4, ambulatory.

## 2016-07-25 NOTE — Discharge Instructions (Signed)
Follow-up with cardiology for further work-up.

## 2016-07-25 NOTE — ED Provider Notes (Signed)
[facility]-EMERGENCY DEPT Provider Note   CSN: [id number] Arrival date & time: 07/25/16  0049  By signing my name below, I, Suzan Slick. Elon Spanner, attest that this documentation has been prepared under the direction and in the presence of Shon Baton, MD.  Electronically Signed: Suzan Slick. Elon Spanner, ED Scribe. 07/25/16. 1:09 AM.    History   Chief Complaint Chief Complaint  Patient presents with  . Palpitations   The history is provided by the patient. No language interpreter was used.    HPI Comments: Tanya [name], brought in by EMS is a 55 y.o. female with a PMHx of HTN, DM, and hyperlipidemia who presents to the Emergency Department here after an episode of palpitations onset prior to arrival. Pt states she felt like her heart was beating fast which woke her from sleep. However, currently she states she is symptom free and denies any prior history of same. No aggravating or alleviating factors at this time. No recent fever, chills, nausea, vomiting, or shortness of breath. Denies any history of heart disease.Patient was noted to be hypertensive in route. She takes multiple blood pressure medications daily including clonidine. She reports taking her medications as prescribed.  PCP: Nadean Corwin, MD    Past Medical History:  Diagnosis Date  . Allergy   . Diabetes mellitus   . Goiter   . Hyperlipidemia   . Hypertension   . Morbid obesity (HCC) 12/24/2013  . OSA (obstructive sleep apnea)   . Sarcoid (HCC)   . Sarcoidosis (HCC)   . Vitamin D deficiency     Patient Active Problem List   Diagnosis Date Noted  . Morbid obesity (HCC) 12/24/2013  . Hypertension   . Hyperlipidemia   . Diabetes mellitus without complication (HCC)   . Allergy   . OSA (obstructive sleep apnea)   . Goiter   . Sarcoid (HCC)   . Vitamin D deficiency     Past Surgical History:  Procedure Laterality Date  . ABDOMINAL HYSTERECTOMY    . achiles tendon     right  . EYE SURGERY    . TONSILLECTOMY       OB History    No data available       Home Medications    Prior to Admission medications   Medication Sig Start Date End Date Taking? Authorizing Provider  aspirin EC 81 MG tablet Take 81 mg by mouth daily.     Yes Historical Provider, MD  B Complex-C (SUPER B COMPLEX PO) Take by mouth daily.   Yes Historical Provider, MD  bisoprolol-hydrochlorothiazide [facility]) 10-6.25 MG tablet TAKE 1 TABLET BY MOUTH EVERY DAY 05/20/16  Yes Lucky Cowboy, MD  Cholecalciferol (VITAMIN D PO) Take 5,000 Int'l Units by mouth 3 (three) times a week.    Yes Historical Provider, MD  cloNIDine (CATAPRES) 0.2 MG tablet TAKE 1 TABLET (0.2 MG TOTAL) BY MOUTH 2 (TWO) TIMES DAILY. 06/23/16  Yes Lucky Cowboy, MD  furosemide (LASIX) 40 MG tablet Take 1 tablet 2 x/day for BP & Fluid retention Patient taking differently: Take 40 mg by mouth 2 (two) times daily.  06/11/16  Yes Lucky Cowboy, MD  losartan (COZAAR) 100 MG tablet TAKE 1 TABLET (100 MG TOTAL) BY MOUTH DAILY. 10/22/15  Yes Lucky Cowboy, MD  Magnesium 250 MG TABS Take 250 mg by mouth daily.   Yes Historical Provider, MD  metFORMIN (GLUCOPHAGE-XR) 500 MG 24 hr tablet TAKE 2 TABLETS BY MOUTH TWICE A DAY 06/10/16  Yes Lucky Cowboy, MD  Multiple Vitamins-Minerals (MULTIVITAMIN PO) Take by mouth daily.   Yes Historical Provider, MD  Vitamin D, Ergocalciferol, (DRISDOL) 50000 units CAPS capsule TAKE ONE CAPSULE BY MOUTH DAILY Patient not taking: Reported on 07/25/2016 11/14/15   Lucky Cowboy, MD    Family History Family History  Problem Relation Age of Onset  . Heart disease Mother   . Diabetes Father   . Heart disease Father   . Diabetes Paternal Grandmother   . Heart disease Paternal Grandmother   . Colon cancer Neg Hx   . Esophageal cancer Neg Hx   . Rectal cancer Neg Hx   . Stomach cancer Neg Hx     Social History Social History  Substance Use Topics  . Smoking status: Never Smoker  . Smokeless tobacco: Never Used  . Alcohol use  No     Allergies   Naproxen and Meloxicam & diet manage prod   Review of Systems Review of Systems  Constitutional: Negative for chills and fever.  Respiratory: Negative for shortness of breath.   Cardiovascular: Positive for palpitations. Negative for leg swelling.  Gastrointestinal: Negative for nausea and vomiting.  All other systems reviewed and are negative.    Physical Exam Updated Vital Signs BP 146/78   Pulse 76   Temp 98.2 F (36.8 C) (Oral)   Resp 18   Ht 5\' 5"  (1.651 m)   Wt 229 lb (103.9 kg)   SpO2 98%   BMI 38.11 kg/m   Physical Exam  Constitutional: She is oriented to person, place, and time. She appears well-developed and well-nourished. No distress.  Overweight  HENT:  Head: Normocephalic and atraumatic.  Cardiovascular: Normal rate, regular rhythm and normal heart sounds.   Pulmonary/Chest: Effort normal. No respiratory distress. She has no wheezes.  Abdominal: Soft. Bowel sounds are normal.  Musculoskeletal: She exhibits no edema.  Neurological: She is alert and oriented to person, place, and time.  Skin: Skin is warm and dry.  Psychiatric: She has a normal mood and affect.  Nursing note and vitals reviewed.    ED Treatments / Results   DIAGNOSTIC STUDIES: Oxygen Saturation is 100% on RA, Normal by my interpretation.    COORDINATION OF CARE: 1:08 AM- Will order CXR, blood work, and EKG. Will give Clonidine. Discussed treatment plan with pt at bedside and pt agreed to plan.     Labs (all labs ordered are listed, but only abnormal results are displayed) Labs Reviewed  CBC WITH DIFFERENTIAL/PLATELET - Abnormal; Notable for the following:       Result Value   WBC 14.2 (*)    Hemoglobin 11.9 (*)    MCH 25.6 (*)    Platelets 420 (*)    Neutro Abs 8.4 (*)    Lymphs Abs 4.8 (*)    All other components within normal limits  BASIC METABOLIC PANEL - Abnormal; Notable for the following:    Chloride 99 (*)    Glucose, Bld 148 (*)    All other  components within normal limits  TSH  I-STAT TROPOININ, ED    EKG  EKG Interpretation  Date/Time:  Sunday July 25 2016 00:55:57 EDT Ventricular Rate:  71 PR Interval:    QRS Duration: 112 QT Interval:  392 QTC Calculation: 426 R Axis:   11 Text Interpretation:  Sinus rhythm Probable left atrial enlargement Borderline intraventricular conduction delay Confirmed by Wilkie Aye  MD, Toni Amend ([id number]) on 07/25/2016 1:29:06 AM       Radiology Dg Chest 2 View  Result Date:  07/25/2016 CLINICAL DATA:  Episodes of chest palpitations. High blood pressure. EXAM: CHEST  2 VIEW COMPARISON:  08/03/2010 FINDINGS: Normal heart size and pulmonary vascularity. No focal airspace disease or consolidation in the lungs. No blunting of costophrenic angles. No pneumothorax. Mediastinal contours appear intact. Degenerative changes in the spine. Calcified and tortuous aorta. IMPRESSION: No active cardiopulmonary disease. Electronically Signed   By: Burman NievesWilliam  Stevens M.D.   On: 07/25/2016 01:43    Procedures Procedures (including critical care time)  Medications Ordered in ED Medications  cloNIDine (CATAPRES) tablet 0.2 mg (0.2 mg Oral Given 07/25/16 0123)     Initial Impression / Assessment and Plan / ED Course  I have reviewed the triage vital signs and the nursing notes.  Pertinent labs & imaging results that were available during my care of the patient were reviewed by me and considered in my medical decision making (see chart for details).  Clinical Course    Patient presents with palpitations. Noted to be hypertensive initially. Otherwise nontoxic. Currently without symptoms. EKG is reassuring. An initial workup is also reassuring. History of goiter and TSH is normal. Patient has remained asymptomatic while in the ER. She was given 1 dose of clonidine for her blood pressure. Repeat blood pressure 146/78. We'll have patient follow-up with her primary physician and cardiology for further  evaluation.  After history, exam, and medical workup I feel the patient has been appropriately medically screened and is safe for discharge home. Pertinent diagnoses were discussed with the patient. Patient was given return precautions.   Final Clinical Impressions(s) / ED Diagnoses   Final diagnoses:  Palpitations    New Prescriptions Discharge Medication List as of 07/25/2016  5:03 AM     I personally performed the services described in this documentation, which was scribed in my presence. The recorded information has been reviewed and is accurate.    Shon Batonourtney F Laisa Larrick, MD 07/25/16 [id number]

## 2016-07-29 ENCOUNTER — Encounter: Payer: Self-pay | Admitting: Cardiovascular Disease

## 2016-07-29 ENCOUNTER — Ambulatory Visit (INDEPENDENT_AMBULATORY_CARE_PROVIDER_SITE_OTHER): Payer: 59 | Admitting: Cardiovascular Disease

## 2016-07-29 VITALS — BP 151/75 | HR 60 | Ht 64.0 in | Wt 235.0 lb

## 2016-07-29 DIAGNOSIS — I10 Essential (primary) hypertension: Secondary | ICD-10-CM | POA: Diagnosis not present

## 2016-07-29 DIAGNOSIS — R002 Palpitations: Secondary | ICD-10-CM | POA: Diagnosis not present

## 2016-07-29 DIAGNOSIS — D869 Sarcoidosis, unspecified: Secondary | ICD-10-CM

## 2016-07-29 MED ORDER — HYDRALAZINE HCL 25 MG PO TABS: 25.0000 mg | ORAL_TABLET | Freq: Three times a day (TID) | ORAL | 1 refills | Status: DC

## 2016-07-29 NOTE — Patient Instructions (Addendum)
Medication Instructions:  START HYDRALAZINE 25 MG THREE TIMES A DAY   Labwork: none  Testing/Procedures: Your physician has requested that you have an echocardiogram. Echocardiography is a painless test that uses sound waves to create images of your heart. It provides your doctor with information about the size and shape of your heart and how well your heart's chambers and valves are working. This procedure takes approximately one hour. There are no restrictions for this procedure. [facility] AT [address]  Follow-Up: Your physician wants you to follow-up in: 6 MONTHS  You will receive a reminder letter in the mail two months in advance. If you don't receive a letter, please call our office to schedule the follow-up appointment.  If you need a refill on your cardiac medications before your next appointment, please call your pharmacy.

## 2016-07-29 NOTE — Progress Notes (Signed)
Cardiology Office Note   Date:  08/01/2016   ID:  Tanya Tanya [name], DOB 06/05/1961, MRN [id number]  PCP:  Nadean Corwin, MD  Cardiologist:   Chilton Si, MD   Chief Complaint  Tanya [name] presents with  . New Tanya [name] (Initial Visit)    Episode of high BP w/ palpitations.       History of Present Illness: Tanya Tanya [name] is a 55 y.o. female with hypertension, hyperlipidemia, sarcoidosis, OSA and morbid obesity who presents for an evaluation of palpitations.  Last week Tanya Tanya [name] had an episode of palpitations that awakened her from sleeping.  She felt like she was having a heart attack.  Her husband called EMS and her heart rate was reportedly OK, but her systolic blood pressure was 260 mmHg.  Tanya episode lasted 30 minutes and there was no associated chest pain, shortness of breath, lightheadedness, dizziness, nausea, or diaphoresis.    Tanya Tanya [name] started walking for exercise this week.  She did not report any exertional symptoms.  She notes lower extremity edema at Tanya end of Tanya day, but it improves with elevation.  She denies orthopnea or PND.  She drinks caffeine very rarely and denies over Tanya counter cold/cough medications or illicits.  Tanya Tanya [name] was diagnosed with sarcoidosis based on a skin biopsy.  She doesn't have any known pulmonary or cardiac involvement.  She reports years of difficulty controlling her blood pressure.  She checks it at home regularly.  It was125/109 yesterday and143/88 this am.  Past Medical History:  Diagnosis Date  . Allergy   . Diabetes mellitus   . Goiter   . Hyperlipidemia   . Hypertension   . Morbid obesity (HCC) 12/24/2013  . OSA (obstructive sleep apnea)   . Palpitations 08/01/2016  . Sarcoid (HCC)   . Sarcoidosis (HCC)   . Vitamin D deficiency     Past Surgical History:  Procedure Laterality Date  . ABDOMINAL HYSTERECTOMY    . achiles tendon     right  . EYE SURGERY    . TONSILLECTOMY       Current Outpatient Prescriptions    Medication Sig Dispense Refill  . aspirin EC 81 MG tablet Take 81 mg by mouth daily.      . B Complex-C (SUPER B COMPLEX PO) Take by mouth daily.    . bisoprolol-hydrochlorothiazide (ZIAC) 10-6.25 MG tablet TAKE 1 TABLET BY MOUTH EVERY DAY 90 tablet 3  . Cholecalciferol (VITAMIN D PO) Take 5,000 Int'l Units by mouth 3 (three) times a week.     . cloNIDine (CATAPRES) 0.2 MG tablet TAKE 1 TABLET (0.2 MG TOTAL) BY MOUTH 2 (TWO) TIMES DAILY. 180 tablet 0  . furosemide (LASIX) 40 MG tablet Take 1 tablet 2 x/day for BP & Fluid retention (Tanya [name] taking differently: Take 40 mg by mouth 2 (two) times daily. ) 180 tablet 0  . losartan (COZAAR) 100 MG tablet TAKE 1 TABLET (100 MG TOTAL) BY MOUTH DAILY. 90 tablet 3  . Magnesium 250 MG TABS Take 250 mg by mouth daily.    . metFORMIN (GLUCOPHAGE-XR) 500 MG 24 hr tablet TAKE 2 TABLETS BY MOUTH TWICE A DAY 120 tablet 3  . Multiple Vitamins-Minerals (MULTIVITAMIN PO) Take by mouth daily.    . Vitamin D, Ergocalciferol, (DRISDOL) 50000 units CAPS capsule TAKE ONE CAPSULE BY MOUTH DAILY 90 capsule 0  . hydrALAZINE (APRESOLINE) 25 MG tablet Take 1 tablet (25 mg total) by mouth 3 (three) times daily. 270 tablet 1  No current facility-administered medications for this visit.     Allergies:   Amlodipine; Naproxen; and Meloxicam & diet manage prod    Social History:  Tanya Tanya [name]  reports that she has never smoked. She has never used smokeless tobacco. She reports that she does not drink alcohol or use drugs.   Family History:  Tanya Tanya [name]'s family history includes Diabetes in her father and paternal grandmother; Heart disease in her father, paternal grandmother, and sister; Hypertension in her brother, mother, and sister.    ROS:  Please see Tanya history of present illness.   Otherwise, review of systems are positive for none.   All other systems are reviewed and negative.    PHYSICAL EXAM: VS:  BP (!) 151/75   Pulse 60   Ht 5\' 4"  (1.626 m)   Wt 235 lb  (106.6 kg)   BMI 40.34 kg/m  , BMI Body mass index is 40.34 kg/m. GENERAL:  Well appearing HEENT:  Pupils equal round and reactive, fundi not visualized, oral mucosa unremarkable NECK:  No jugular venous distention, waveform within normal limits, carotid upstroke brisk and symmetric, no bruits, no thyromegaly LYMPHATICS:  No cervical adenopathy LUNGS:  Clear to auscultation bilaterally HEART:  RRR.  PMI not displaced or sustained,S1 and S2 within normal limits, no S3, no S4, no clicks, no rubs, no murmurs ABD:  Flat, positive bowel sounds normal in frequency in pitch, no bruits, no rebound, no guarding, no midline pulsatile mass, no hepatomegaly, no splenomegaly EXT:  2 plus pulses throughout, no edema, no cyanosis no clubbing SKIN:  No rashes no nodules NEURO:  Cranial nerves II through XII grossly intact, motor grossly intact throughout PSYCH:  Cognitively intact, oriented to person place and time   EKG:  EKG is ordered today. Tanya ekg ordered today demonstrates sinus rhythm rate 60 bpm. R axis deviation.    Recent Labs: 05/18/2016: ALT 19; Magnesium 1.8 07/25/2016: BUN 16; Creatinine, Ser 0.61; Hemoglobin 11.9; Platelets 420; Potassium 3.6; Sodium 139; TSH 0.992    Lipid Panel    Component Value Date/Time   CHOL 175 05/18/2016 1009   TRIG 59 05/18/2016 1009   HDL 105 05/18/2016 1009   CHOLHDL 1.7 05/18/2016 1009   VLDL 12 05/18/2016 1009   LDLCALC 58 05/18/2016 1009      Wt Readings from Last 3 Encounters:  07/29/16 235 lb (106.6 kg)  07/25/16 229 lb (103.9 kg)  05/18/16 226 lb (102.5 kg)      ASSESSMENT AND PLAN:  # Palpitations: Tanya Tanya [name] had an isolated episode of palpitations last week.  She has not experienced any recurrent symptoms.  She is not interested in wearing a monitor at this time.  If it recurs she will call us. Thyroid function, BMP and CBC were unremarkable when she was seen in Tanya hospital.  # Sarcoidosis:  No known cardiac involvement.  However,  arhythmias can be associated with sarcoidosis.  We will get an echo to see if there is any evidence of infiltrative disease.  # Hypertension: Blood pressure is poorly-controlled.  We will add hydralazine 25mg  tid. Continue bisoprolol, HCTZ, clonidine and losartan.    Current medicines are reviewed at length with Tanya Tanya [name] today.  Tanya Tanya [name] does not have concerns regarding medicines.  Tanya following changes have been made:  Hydralazine 25 mg tid  Labs/ tests ordered today include:   Orders Placed This Encounter  Procedures  . EKG 12-Lead  . ECHOCARDIOGRAM COMPLETE     Disposition:  FU with Kerrington Greenhalgh C. Duke Salvia, MD, [facility] in 6 months.    This note was written with Tanya assistance of speech recognition software.  Please excuse any transcriptional errors.  Signed, Jelani Vreeland C. Duke Salvia, MD, [facility]  08/01/2016 7:00 PM    [facility]

## 2016-08-01 ENCOUNTER — Encounter: Payer: Self-pay | Admitting: Cardiovascular Disease

## 2016-08-01 DIAGNOSIS — R002 Palpitations: Secondary | ICD-10-CM

## 2016-08-01 HISTORY — DX: Palpitations: R00.2

## 2016-08-04 ENCOUNTER — Ambulatory Visit (HOSPITAL_COMMUNITY): Payer: 59 | Attending: Cardiovascular Disease

## 2016-08-04 ENCOUNTER — Other Ambulatory Visit: Payer: Self-pay

## 2016-08-04 DIAGNOSIS — R002 Palpitations: Secondary | ICD-10-CM | POA: Diagnosis not present

## 2016-08-04 DIAGNOSIS — D869 Sarcoidosis, unspecified: Secondary | ICD-10-CM

## 2016-08-04 DIAGNOSIS — I517 Cardiomegaly: Secondary | ICD-10-CM | POA: Insufficient documentation

## 2016-08-19 ENCOUNTER — Ambulatory Visit: Payer: Self-pay | Admitting: Internal Medicine

## 2016-09-02 ENCOUNTER — Ambulatory Visit (INDEPENDENT_AMBULATORY_CARE_PROVIDER_SITE_OTHER): Payer: 59 | Admitting: Internal Medicine

## 2016-09-02 ENCOUNTER — Encounter: Payer: Self-pay | Admitting: Internal Medicine

## 2016-09-02 VITALS — BP 142/66 | HR 60 | Temp 98.2°F | Resp 18 | Ht 64.0 in | Wt 237.0 lb

## 2016-09-02 DIAGNOSIS — Z79899 Other long term (current) drug therapy: Secondary | ICD-10-CM | POA: Diagnosis not present

## 2016-09-02 DIAGNOSIS — M7660 Achilles tendinitis, unspecified leg: Secondary | ICD-10-CM | POA: Diagnosis not present

## 2016-09-02 DIAGNOSIS — I10 Essential (primary) hypertension: Secondary | ICD-10-CM

## 2016-09-02 DIAGNOSIS — Z23 Encounter for immunization: Secondary | ICD-10-CM | POA: Diagnosis not present

## 2016-09-02 DIAGNOSIS — E119 Type 2 diabetes mellitus without complications: Secondary | ICD-10-CM

## 2016-09-02 DIAGNOSIS — E559 Vitamin D deficiency, unspecified: Secondary | ICD-10-CM | POA: Diagnosis not present

## 2016-09-02 DIAGNOSIS — D869 Sarcoidosis, unspecified: Secondary | ICD-10-CM | POA: Diagnosis not present

## 2016-09-02 DIAGNOSIS — E782 Mixed hyperlipidemia: Secondary | ICD-10-CM

## 2016-09-02 LAB — CBC WITH DIFFERENTIAL/PLATELET
BASOS PCT: 0 %
Basophils Absolute: 0 cells/uL (ref 0–200)
EOS ABS: 144 {cells}/uL (ref 15–500)
Eosinophils Relative: 2 %
HEMATOCRIT: 36.8 % (ref 35.0–45.0)
Hemoglobin: 11.8 g/dL (ref 11.7–15.5)
LYMPHS ABS: 3744 {cells}/uL (ref 850–3900)
Lymphocytes Relative: 52 %
MCH: 25.8 pg — ABNORMAL LOW (ref 27.0–33.0)
MCHC: 32.1 g/dL (ref 32.0–36.0)
MCV: 80.5 fL (ref 80.0–100.0)
MONO ABS: 504 {cells}/uL (ref 200–950)
MPV: 10.1 fL (ref 7.5–12.5)
Monocytes Relative: 7 %
NEUTROS ABS: 2808 {cells}/uL (ref 1500–7800)
Neutrophils Relative %: 39 %
PLATELETS: 400 10*3/uL (ref 140–400)
RBC: 4.57 MIL/uL (ref 3.80–5.10)
RDW: 15.6 % — ABNORMAL HIGH (ref 11.0–15.0)
WBC: 7.2 10*3/uL (ref 3.8–10.8)

## 2016-09-02 LAB — HEPATIC FUNCTION PANEL
ALBUMIN: 4 g/dL (ref 3.6–5.1)
ALK PHOS: 50 U/L (ref 33–130)
ALT: 14 U/L (ref 6–29)
AST: 15 U/L (ref 10–35)
BILIRUBIN INDIRECT: 0.3 mg/dL (ref 0.2–1.2)
BILIRUBIN TOTAL: 0.4 mg/dL (ref 0.2–1.2)
Bilirubin, Direct: 0.1 mg/dL (ref <=0.2)
Total Protein: 6.6 g/dL (ref 6.1–8.1)

## 2016-09-02 LAB — BASIC METABOLIC PANEL WITH GFR
BUN: 16 mg/dL (ref 7–25)
CHLORIDE: 100 mmol/L (ref 98–110)
CO2: 26 mmol/L (ref 20–31)
Calcium: 9.4 mg/dL (ref 8.6–10.4)
Creat: 0.55 mg/dL (ref 0.50–1.05)
GFR, Est African American: >89 mL/min (ref >=60)
GFR, Est Non African American: >89 mL/min (ref >=60)
GLUCOSE: 107 mg/dL — AB (ref 65–99)
POTASSIUM: 4.1 mmol/L (ref 3.5–5.3)
Sodium: 140 mmol/L (ref 135–146)

## 2016-09-02 LAB — HEMOGLOBIN A1C
Hgb A1c MFr Bld: 7.1 % — ABNORMAL HIGH (ref <5.7)
Mean Plasma Glucose: 157 mg/dL

## 2016-09-02 MED ORDER — PREDNISONE 20 MG PO TABS: ORAL_TABLET | ORAL | 0 refills | Status: DC

## 2016-09-02 NOTE — Patient Instructions (Addendum)
Please start taking meloxicam once daily with food to see if this will help with your achilles tendonitis.  Please use a gel insert in your heels of your shoes.  Try using paper cup and fill It with water and freeze it.  You can use this to massage the area of the heel and the back of your legs that is tired and aching.

## 2016-09-02 NOTE — Progress Notes (Signed)
Assessment and Plan:  Hypertension:  -Continue medication,  -monitor blood pressure at home.  -Continue DASH diet.   -Reminder to go to the ER if any CP, SOB, nausea, dizziness, severe HA, changes vision/speech, left arm numbness and tingling, and jaw pain.  Cholesterol: -Continue diet and exercise.  -Check cholesterol.   Pre-diabetes: -Continue diet and exercise.  -Check A1C  Vitamin D Def: -check level -continue medications.   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 Further disposition pending results of labs.  HPI 55 y.o. female  presents for 3 month follow up with hypertension, hyperlipidemia, prediabetes and vitamin D.   Her blood pressure has been controlled at home, today their BP is BP: (!) 142/66.   She does workout. She denies chest pain, shortness of breath, dizziness.   She is on cholesterol medication and denies myalgias. Her cholesterol is at goal. The cholesterol last visit was:   Lab Results  Component Value Date   CHOL 175 05/18/2016   HDL 105 05/18/2016   LDLCALC 58 05/18/2016   TRIG 59 05/18/2016   CHOLHDL 1.7 05/18/2016     She has been working on diet and exercise for prediabetes, and denies foot ulcerations, hyperglycemia, hypoglycemia , increased appetite, nausea, paresthesia of the feet, polydipsia, polyuria, visual disturbances, vomiting and weight loss. Last A1C in the office was:  Lab Results  Component Value Date   HGBA1C 6.9 (H) 05/18/2016  She has no hypoglycemic symptoms.  She reports that she is not checking sugars at home.  She reports that she did have to take prednisone recently for sinus infection.  Patient is on Vitamin D  supplement.  Lab Results  Component Value Date   VD25OH 118 (H) 05/18/2016      She is having some issues with some achilles pain.  She reports that it is sore and nagging.  She reports that she did tear the achilles tendon on the right foot in the past.  She does have a history of seeing an orthopedist.  She reports that she is wearing tennis shoes daily.  She reports has had an issue with her second right toenail.  It is brown and has a ridge.  She cannot remember injuring it.     Current Medications:  Current Outpatient Prescriptions on File Prior to Visit  Medication Sig Dispense Refill  . aspirin EC 81 MG tablet Take 81 mg by mouth daily.      . B Complex-C (SUPER B COMPLEX PO) Take by mouth daily.    . bisoprolol-hydrochlorothiazide (ZIAC) 10-6.25 MG tablet TAKE 1 TABLET BY MOUTH EVERY DAY 90 tablet 3  . Cholecalciferol (VITAMIN D PO) Take 5,000 Int'l Units by mouth 3 (three) times a week.     . cloNIDine (CATAPRES) 0.2 MG tablet TAKE 1 TABLET (0.2 MG TOTAL) BY MOUTH 2 (TWO) TIMES DAILY. 180 tablet 0  . furosemide (LASIX) 40 MG tablet Take 1 tablet 2 x/day for BP & Fluid retention (Patient taking differently: Take 40 mg by mouth 2 (two) times daily. ) 180 tablet 0  . hydrALAZINE (APRESOLINE) 25 MG tablet Take 1 tablet (25 mg total) by mouth 3 (three) times daily. 270 tablet 1  . losartan (COZAAR) 100 MG  tablet TAKE 1 TABLET (100 MG TOTAL) BY MOUTH DAILY. 90 tablet 3  . Magnesium 250 MG TABS Take 250 mg by mouth daily.    . metFORMIN (GLUCOPHAGE-XR) 500 MG 24 hr tablet TAKE 2 TABLETS BY MOUTH TWICE A DAY 120 tablet 3  . Multiple Vitamins-Minerals (MULTIVITAMIN PO) Take by mouth daily.    . Vitamin D, Ergocalciferol, (DRISDOL) 50000 units CAPS capsule TAKE ONE CAPSULE BY MOUTH DAILY 90 capsule 0   No current facility-administered medications on file prior to visit.     Medical History:  Past Medical History:  Diagnosis Date  . Allergy   . Diabetes mellitus   . Goiter   .  Hyperlipidemia   . Hypertension   . Morbid obesity (HCC) 12/24/2013  . OSA (obstructive sleep apnea)   . Palpitations 08/01/2016  . Sarcoid (HCC)   . Sarcoidosis (HCC)   . Vitamin D deficiency     Allergies:  Allergies  Allergen Reactions  . Amlodipine Swelling  . Naproxen Hives  . Meloxicam & Diet Manage Prod Rash     Review of Systems:  Review of Systems  Constitutional: Negative for chills, fever and malaise/fatigue.  HENT: Negative for congestion, ear pain and sore throat.   Eyes: Negative.   Respiratory: Negative for cough, shortness of breath and wheezing.   Cardiovascular: Negative for chest pain, palpitations and leg swelling.  Gastrointestinal: Negative for abdominal pain, blood in stool, constipation, diarrhea, heartburn and melena.  Genitourinary: Negative.   Skin: Negative.   Neurological: Negative for dizziness, sensory change, loss of consciousness and headaches.  Psychiatric/Behavioral: Negative for depression. The patient is not nervous/anxious and does not have insomnia.     Family history- Review and unchanged  Social history- Review and unchanged  Physical Exam: BP (!) 142/66   Pulse 60   Temp 98.2 F (36.8 C) (Temporal)   Resp 18   Ht 5\' 4"  (1.626 m)   Wt 237 lb (107.5 kg)   BMI 40.68 kg/m  Wt Readings from Last 3 Encounters:  09/02/16 237 lb (107.5 kg)  07/29/16 235 lb (106.6 kg)  07/25/16 229 lb (103.9 kg)    General Appearance: Well nourished well developed, in no apparent distress. Eyes: PERRLA, EOMs, conjunctiva no swelling or erythema ENT/Mouth: Ear canals normal without obstruction, swelling, erythma, discharge.  TMs normal bilaterally.  Oropharynx moist, clear, without exudate, or postoropharyngeal swelling. Neck: Supple, thyroid normal,no cervical adenopathy  Respiratory: Respiratory effort normal, Breath sounds clear A&P without rhonchi, wheeze, or rale.  No retractions, no accessory usage. Cardio: RRR with no MRGs. Brisk peripheral  pulses without edema.  Abdomen: Soft, + BS,  Non tender, no guarding, rebound, hernias, masses. Musculoskeletal: Full ROM, 5/5 strength, Normal gait.  TTP to the left achilles tendon at the insertion.   Skin: Warm, dry without rashes, lesions, ecchymosis. Second toenail with brown color to the nail with obvious new nail growth beneath.  Does appear to be old subungal hematoma.  Neuro: Awake and oriented X 3, Cranial nerves intact. Normal muscle tone, no cerebellar symptoms. Psych: Normal affect, Insight and Judgment appropriate.    Terri Piedraourtney Forcucci, PA-C 10:09 AM [facility]

## 2016-09-03 LAB — VITAMIN D 25 HYDROXY (VIT D DEFICIENCY, FRACTURES): Vit D, 25-Hydroxy: 98 ng/mL (ref 30–100)

## 2016-09-07 ENCOUNTER — Other Ambulatory Visit: Payer: Self-pay | Admitting: Internal Medicine

## 2016-09-07 ENCOUNTER — Other Ambulatory Visit: Payer: Self-pay | Admitting: *Deleted

## 2016-09-07 MED ORDER — METFORMIN HCL ER 500 MG PO TB24: 1000.0000 mg | ORAL_TABLET | Freq: Two times a day (BID) | ORAL | 0 refills | Status: DC

## 2016-09-08 ENCOUNTER — Other Ambulatory Visit: Payer: Self-pay | Admitting: Internal Medicine

## 2016-09-22 ENCOUNTER — Other Ambulatory Visit: Payer: Self-pay | Admitting: Internal Medicine

## 2016-10-07 ENCOUNTER — Other Ambulatory Visit: Payer: Self-pay | Admitting: Internal Medicine

## 2016-10-21 ENCOUNTER — Other Ambulatory Visit: Payer: Self-pay | Admitting: Internal Medicine

## 2016-10-21 ENCOUNTER — Telehealth: Payer: Self-pay | Admitting: Internal Medicine

## 2016-10-21 DIAGNOSIS — M7662 Achilles tendinitis, left leg: Secondary | ICD-10-CM

## 2016-10-21 NOTE — Telephone Encounter (Signed)
left achilles tendonitis, no improvement please advise if referral is reccomended or office visit

## 2016-10-30 ENCOUNTER — Other Ambulatory Visit: Payer: Self-pay | Admitting: Internal Medicine

## 2016-11-02 ENCOUNTER — Ambulatory Visit (INDEPENDENT_AMBULATORY_CARE_PROVIDER_SITE_OTHER): Payer: 59 | Admitting: Orthopaedic Surgery

## 2016-11-02 DIAGNOSIS — M7662 Achilles tendinitis, left leg: Secondary | ICD-10-CM | POA: Diagnosis not present

## 2016-11-02 MED ORDER — DICLOFENAC SODIUM 1 % TD GEL
2.0000 g | Freq: Four times a day (QID) | TRANSDERMAL | 3 refills | Status: DC
Start: 2016-11-02 — End: 2022-12-23

## 2016-11-02 NOTE — Progress Notes (Signed)
Office Visit Note   Patient: Tanya [name]           Date of Birth: 09-Aug-1961           MRN: [id number] Visit Date: 11/02/2016              Requested by: Lucky Cowboy, MD [address] [place], [place] [address] PCP: Nadean Corwin, MD   Assessment & Plan: Visit Diagnoses:  1. Achilles tendinitis, left leg     Plan: She definitely has Achilles tendinitis in the Achilles is intact. I will have her try topical anti-inflammatories as well as a band for stretching.  She can use her treadmill would only keeping it flat. I told her my next step would be to shatter down completely and put her in a walking boot. I do feel that the stretch band using it at night for 2-3 sets at 30 repetitions a set can help this calm down.  Follow-Up Instructions: Return if symptoms worsen or fail to improve.   Orders:  No orders of the defined types were placed in this encounter.  Meds ordered this encounter  Medications  . diclofenac sodium (VOLTAREN) 1 % GEL    Sig: Apply 2 g topically 4 (four) times daily.    Dispense:  100 g    Refill:  3      Procedures: No procedures performed   Clinical Data: No additional findings.   Subjective: Chief Complaint  Patient presents with  . Left Foot - Pain    Patient states she has had pain in left heel for 2-3 months. She has had prednisone, exercises, and anti-inflammatories from her PCP with just some help. States the pain is worse at the end of the day. Denies swelling    HPI She reports that she has tried anti-inflammatories and even a steroid. This has not helped. She had previous right Achilles tendon surgery years ago. She is worried that her left Achilles was heading in that direction. She hurts just over the Achilles and the back of her ankle. She denies any weakness. Review of Systems He currently denies any chest pain, shortness of breath, fever, chills, nausea, vomiting.  Objective: Vital Signs: There were no  vitals taken for this visit.  Physical Exam He is alert and oriented 3 Ortho Exam Emanation of her left ankle shows a negative Thompson test. She has full range of motion of her ankle. There is pain over the Achilles but the Achilles is intact. She's neurovascularly intact. Specialty Comments:  No specialty comments available.  Imaging: No results found.   PMFS History: Patient Active Problem List   Diagnosis Date Noted  . Palpitations 08/01/2016  . Morbid obesity (HCC) 12/24/2013  . Hypertension   . Hyperlipidemia   . Diabetes mellitus without complication (HCC)   . Allergy   . OSA (obstructive sleep apnea)   . Goiter   . Sarcoid (HCC)   . Vitamin D deficiency    Past Medical History:  Diagnosis Date  . Allergy   . Diabetes mellitus   . Goiter   . Hyperlipidemia   . Hypertension   . Morbid obesity (HCC) 12/24/2013  . OSA (obstructive sleep apnea)   . Palpitations 08/01/2016  . Sarcoid (HCC)   . Sarcoidosis (HCC)   . Vitamin D deficiency     Family History  Problem Relation Age of Onset  . Hypertension Mother   . Diabetes Father   . Heart disease Father   .  Diabetes Paternal Grandmother   . Heart disease Paternal Grandmother   . Hypertension Sister   . Heart disease Sister   . Hypertension Brother   . Colon cancer Neg Hx   . Esophageal cancer Neg Hx   . Rectal cancer Neg Hx   . Stomach cancer Neg Hx     Past Surgical History:  Procedure Laterality Date  . ABDOMINAL HYSTERECTOMY    . achiles tendon     right  . EYE SURGERY    . TONSILLECTOMY     Social History   Occupational History  . Not on file.   Social History Main Topics  . Smoking status: Never Smoker  . Smokeless tobacco: Never Used  . Alcohol use No  . Drug use: No  . Sexual activity: Not on file

## 2016-11-15 DIAGNOSIS — G4733 Obstructive sleep apnea (adult) (pediatric): Secondary | ICD-10-CM | POA: Diagnosis not present

## 2016-12-01 DIAGNOSIS — E119 Type 2 diabetes mellitus without complications: Secondary | ICD-10-CM | POA: Diagnosis not present

## 2016-12-03 ENCOUNTER — Ambulatory Visit: Payer: Self-pay | Admitting: Internal Medicine

## 2016-12-08 DIAGNOSIS — R509 Fever, unspecified: Secondary | ICD-10-CM | POA: Diagnosis not present

## 2016-12-08 DIAGNOSIS — J019 Acute sinusitis, unspecified: Secondary | ICD-10-CM | POA: Diagnosis not present

## 2016-12-08 DIAGNOSIS — R05 Cough: Secondary | ICD-10-CM | POA: Diagnosis not present

## 2016-12-09 ENCOUNTER — Other Ambulatory Visit: Payer: Self-pay | Admitting: Internal Medicine

## 2016-12-12 ENCOUNTER — Other Ambulatory Visit: Payer: Self-pay | Admitting: Internal Medicine

## 2016-12-19 ENCOUNTER — Other Ambulatory Visit: Payer: Self-pay | Admitting: Internal Medicine

## 2017-01-10 ENCOUNTER — Other Ambulatory Visit: Payer: Self-pay | Admitting: Internal Medicine

## 2017-01-11 ENCOUNTER — Encounter: Payer: Self-pay | Admitting: Internal Medicine

## 2017-01-11 ENCOUNTER — Ambulatory Visit (INDEPENDENT_AMBULATORY_CARE_PROVIDER_SITE_OTHER): Payer: 59 | Admitting: Internal Medicine

## 2017-01-11 VITALS — BP 154/68 | HR 70 | Temp 97.8°F | Resp 18 | Ht 64.0 in | Wt 236.0 lb

## 2017-01-11 DIAGNOSIS — I10 Essential (primary) hypertension: Secondary | ICD-10-CM | POA: Diagnosis not present

## 2017-01-11 DIAGNOSIS — E119 Type 2 diabetes mellitus without complications: Secondary | ICD-10-CM

## 2017-01-11 DIAGNOSIS — E782 Mixed hyperlipidemia: Secondary | ICD-10-CM

## 2017-01-11 DIAGNOSIS — Z79899 Other long term (current) drug therapy: Secondary | ICD-10-CM | POA: Diagnosis not present

## 2017-01-11 DIAGNOSIS — E559 Vitamin D deficiency, unspecified: Secondary | ICD-10-CM

## 2017-01-11 MED ORDER — VITAMIN D (ERGOCALCIFEROL) 1.25 MG (50000 UNIT) PO CAPS: ORAL_CAPSULE | ORAL | 3 refills | Status: DC

## 2017-01-11 NOTE — Progress Notes (Signed)
Assessment and Plan:  Hypertension:  -BP well controlled for patient although still above the range that is recommended.  -Continue medication,  -monitor blood pressure at home.  -Continue DASH diet.   -Reminder to go to the ER if any CP, SOB, nausea, dizziness, severe HA, changes vision/speech, left arm numbness and tingling, and jaw pain.  Cholesterol: -not currently on medications -given DM LDL < 70 -likely needs medication to get to this level -Continue diet and exercise.  -Check cholesterol.   Type 2 Diabetes without complications : -has been borderline well controlled -cont metformin -is tolerating metformin well without diarrhea. -Continue diet and exercise.  -Check A1C  Vitamin D Def: -continue medications.   Morbid Obesity -cont diet and exercise -patient not a good candidate for phentermine given labile hypertension  Continue diet and meds as discussed. Further disposition pending results of labs.  HPI 56 y.o. female  presents for 3 month follow up with hypertension, hyperlipidemia, prediabetes and vitamin D.   Her blood pressure has been controlled at home, today their BP is BP: (!) 154/68.   She does not workout. She denies chest pain, shortness of breath, dizziness.  She checks her blood pressure at home.  She reports that she is getting somewhere around 140s in the morning and then in the evening it is generally in the 150s.  She just finished work.  She has a history of severe lability of her BP.  This is the most stable it has been in some time according to her.  She is taking her medications as prescribed as per her report.    She is on cholesterol medication and denies myalgias. Her cholesterol is at goal. The cholesterol last visit was:   Lab Results  Component Value Date   CHOL 175 05/18/2016   HDL 105 05/18/2016   LDLCALC 58 05/18/2016   TRIG 59 05/18/2016   CHOLHDL 1.7 05/18/2016     She has been working on diet and exercise for prediabetes, and  denies foot ulcerations, hyperglycemia, hypoglycemia , increased appetite, nausea, paresthesia of the feet, polydipsia, polyuria, visual disturbances, vomiting and weight loss. Last A1C in the office was:  Lab Results  Component Value Date   HGBA1C 7.1 (H) 09/02/2016    Patient is on Vitamin D supplement.  Lab Results  Component Value Date   VD25OH 71 09/02/2016     She has been watching her diet and knows that she needs to lose weight but things have been stressful at work.  She is limited in her time.  She reports that she tries to park further back and walk as much as she can.   She has not other complaints today.    Current Medications:  Current Outpatient Prescriptions on File Prior to Visit  Medication Sig Dispense Refill  . aspirin EC 81 MG tablet Take 81 mg by mouth daily.      . B Complex-C (SUPER B COMPLEX PO) Take by mouth daily.    . bisoprolol-hydrochlorothiazide (ZIAC) 10-6.25 MG tablet TAKE 1 TABLET BY MOUTH EVERY DAY 90 tablet 3  . Cholecalciferol (VITAMIN D PO) Take 5,000 Int'l Units by mouth 3 (three) times a week.     . cloNIDine (CATAPRES) 0.2 MG tablet TAKE 1 TABLET TWICE A DAY 180 tablet 0  . diclofenac sodium (VOLTAREN) 1 % GEL Apply 2 g topically 4 (four) times daily. 100 g 3  . furosemide (LASIX) 40 MG tablet TAKE 1 TABLET BY MOUTH TWICE A DAY FOR  BLOOD PRESSURE & FLUID RETENTION 180 tablet 0  . losartan (COZAAR) 100 MG tablet TAKE 1 TABLET (100 MG TOTAL) BY MOUTH DAILY. 90 tablet 1  . Magnesium 250 MG TABS Take 250 mg by mouth daily.    . metFORMIN (GLUCOPHAGE-XR) 500 MG 24 hr tablet TAKE 2 TABLETS (1,000 MG TOTAL) BY MOUTH 2 (TWO) TIMES DAILY. 360 tablet 1  . Multiple Vitamins-Minerals (MULTIVITAMIN PO) Take by mouth daily.    . Vitamin D, Ergocalciferol, (DRISDOL) 50000 units CAPS capsule TAKE ONE CAPSULE BY MOUTH TWICE A WEEK 8 capsule 3  . hydrALAZINE (APRESOLINE) 25 MG tablet Take 1 tablet (25 mg total) by mouth 3 (three) times daily. 270 tablet 1   No  current facility-administered medications on file prior to visit.     Medical History:  Past Medical History:  Diagnosis Date  . Allergy   . Diabetes mellitus   . Goiter   . Hyperlipidemia   . Hypertension   . Morbid obesity (HCC) 12/24/2013  . OSA (obstructive sleep apnea)   . Palpitations 08/01/2016  . Sarcoid (HCC)   . Sarcoidosis (HCC)   . Vitamin D deficiency     Allergies:  Allergies  Allergen Reactions  . Amlodipine Swelling  . Naproxen Hives  . Meloxicam & Diet Manage Prod Rash     Review of Systems:  Review of Systems  Constitutional: Negative for chills, fever and malaise/fatigue.  HENT: Negative for congestion, ear pain and sore throat.   Eyes: Negative.   Respiratory: Negative for cough, shortness of breath and wheezing.   Cardiovascular: Negative for chest pain, palpitations and leg swelling.  Gastrointestinal: Negative for abdominal pain, blood in stool, constipation, diarrhea, heartburn and melena.  Genitourinary: Negative.   Skin: Negative.   Neurological: Negative for dizziness, sensory change, loss of consciousness and headaches.  Psychiatric/Behavioral: Negative for depression. The patient is not nervous/anxious and does not have insomnia.     Family history- Review and unchanged  Social history- Review and unchanged  Physical Exam: BP (!) 154/68   Pulse 70   Temp 97.8 F (36.6 C) (Temporal)   Resp 18   Ht 5\' 4"  (1.626 m)   Wt 236 lb (107 kg)   BMI 40.51 kg/m  Wt Readings from Last 3 Encounters:  01/11/17 236 lb (107 kg)  09/02/16 237 lb (107.5 kg)  07/29/16 235 lb (106.6 kg)    General Appearance: Obese, Well nourished well developed, AAF in no apparent distress. Eyes: PERRLA, EOMs, conjunctiva no swelling or erythema ENT/Mouth: Ear canals normal without obstruction, swelling, erythma, discharge.  TMs with mild bilateral middle ear effusions.  Oropharynx moist, clear, without exudate, or postoropharyngeal swelling. Neck: Supple,  thyroid normal,no cervical adenopathy  Respiratory: Respiratory effort normal, Breath sounds clear A&P without rhonchi, wheeze, or rale.  No retractions, no accessory usage. Cardio: RRR with no MRGs. Brisk peripheral pulses without edema.  Abdomen: Soft, + BS,  Non tender, no guarding, rebound, hernias, masses. Musculoskeletal: Full ROM, 5/5 strength, Normal gait Skin: Warm, dry without rashes, lesions, ecchymosis.  Neuro: Awake and oriented X 3, Cranial nerves intact. Normal muscle tone, no cerebellar symptoms. Psych: Normal affect, Insight and Judgment appropriate.    Terri Piedraourtney Forcucci, PA-C 4:43 PM [facility]

## 2017-01-12 LAB — HEPATIC FUNCTION PANEL
ALBUMIN: 4.1 g/dL (ref 3.6–5.1)
ALT: 24 U/L (ref 6–29)
AST: 19 U/L (ref 10–35)
Alkaline Phosphatase: 58 U/L (ref 33–130)
BILIRUBIN TOTAL: 0.3 mg/dL (ref 0.2–1.2)
Bilirubin, Direct: 0.1 mg/dL (ref <=0.2)
Indirect Bilirubin: 0.2 mg/dL (ref 0.2–1.2)
Total Protein: 6.8 g/dL (ref 6.1–8.1)

## 2017-01-12 LAB — BASIC METABOLIC PANEL WITH GFR
BUN: 18 mg/dL (ref 7–25)
CHLORIDE: 102 mmol/L (ref 98–110)
CO2: 27 mmol/L (ref 20–31)
CREATININE: 0.77 mg/dL (ref 0.50–1.05)
Calcium: 9.4 mg/dL (ref 8.6–10.4)
GFR, Est Non African American: 87 mL/min (ref >=60)
Glucose, Bld: 113 mg/dL — ABNORMAL HIGH (ref 65–99)
Potassium: 4.2 mmol/L (ref 3.5–5.3)
Sodium: 141 mmol/L (ref 135–146)

## 2017-01-12 LAB — CBC WITH DIFFERENTIAL/PLATELET
BASOS ABS: 0 {cells}/uL (ref 0–200)
Basophils Relative: 0 %
EOS ABS: 136 {cells}/uL (ref 15–500)
Eosinophils Relative: 2 %
HCT: 38.2 % (ref 35.0–45.0)
Hemoglobin: 12.3 g/dL (ref 11.7–15.5)
LYMPHS PCT: 51 %
Lymphs Abs: 3468 cells/uL (ref 850–3900)
MCH: 25.5 pg — AB (ref 27.0–33.0)
MCHC: 32.2 g/dL (ref 32.0–36.0)
MCV: 79.3 fL — AB (ref 80.0–100.0)
MONOS PCT: 7 %
MPV: 9.9 fL (ref 7.5–12.5)
Monocytes Absolute: 476 cells/uL (ref 200–950)
NEUTROS PCT: 40 %
Neutro Abs: 2720 cells/uL (ref 1500–7800)
PLATELETS: 426 10*3/uL — AB (ref 140–400)
RBC: 4.82 MIL/uL (ref 3.80–5.10)
RDW: 15.2 % — AB (ref 11.0–15.0)
WBC: 6.8 10*3/uL (ref 3.8–10.8)

## 2017-01-12 LAB — LIPID PANEL
CHOL/HDL RATIO: 1.8 ratio (ref <5.0)
CHOLESTEROL: 182 mg/dL (ref <200)
HDL: 100 mg/dL (ref >50)
LDL Cholesterol: 56 mg/dL (ref <100)
Triglycerides: 131 mg/dL (ref <150)
VLDL: 26 mg/dL (ref <30)

## 2017-01-12 LAB — TSH: TSH: 0.32 mIU/L — ABNORMAL LOW

## 2017-01-12 LAB — HEMOGLOBIN A1C
Hgb A1c MFr Bld: 6.8 % — ABNORMAL HIGH (ref <5.7)
MEAN PLASMA GLUCOSE: 148 mg/dL

## 2017-01-22 ENCOUNTER — Other Ambulatory Visit: Payer: Self-pay | Admitting: Cardiovascular Disease

## 2017-01-24 NOTE — Telephone Encounter (Signed)
Rx(s) sent to pharmacy electronically.  

## 2017-01-24 NOTE — Telephone Encounter (Signed)
Please review for refill. Thanks!  

## 2017-02-14 DIAGNOSIS — G4733 Obstructive sleep apnea (adult) (pediatric): Secondary | ICD-10-CM | POA: Diagnosis not present

## 2017-02-15 ENCOUNTER — Encounter: Payer: Self-pay | Admitting: Physician Assistant

## 2017-02-15 ENCOUNTER — Ambulatory Visit (INDEPENDENT_AMBULATORY_CARE_PROVIDER_SITE_OTHER): Payer: 59 | Admitting: Physician Assistant

## 2017-02-15 VITALS — BP 136/90 | HR 93 | Temp 97.7°F | Resp 14 | Ht 64.0 in | Wt 237.2 lb

## 2017-02-15 DIAGNOSIS — M79674 Pain in right toe(s): Secondary | ICD-10-CM

## 2017-02-15 DIAGNOSIS — M7989 Other specified soft tissue disorders: Secondary | ICD-10-CM | POA: Diagnosis not present

## 2017-02-15 LAB — CBC WITH DIFFERENTIAL/PLATELET
BASOS ABS: 0 {cells}/uL (ref 0–200)
BASOS PCT: 0 %
EOS PCT: 1 %
Eosinophils Absolute: 57 cells/uL (ref 15–500)
HCT: 37.9 % (ref 35.0–45.0)
Hemoglobin: 11.9 g/dL (ref 11.7–15.5)
LYMPHS ABS: 3078 {cells}/uL (ref 850–3900)
Lymphocytes Relative: 54 %
MCH: 24.9 pg — AB (ref 27.0–33.0)
MCHC: 31.4 g/dL — ABNORMAL LOW (ref 32.0–36.0)
MCV: 79.5 fL — AB (ref 80.0–100.0)
MONO ABS: 342 {cells}/uL (ref 200–950)
MPV: 10.2 fL (ref 7.5–12.5)
Monocytes Relative: 6 %
NEUTROS ABS: 2223 {cells}/uL (ref 1500–7800)
NEUTROS PCT: 39 %
PLATELETS: 436 10*3/uL — AB (ref 140–400)
RBC: 4.77 MIL/uL (ref 3.80–5.10)
RDW: 15.7 % — AB (ref 11.0–15.0)
WBC: 5.7 10*3/uL (ref 3.8–10.8)

## 2017-02-15 NOTE — Patient Instructions (Signed)
Foot Pain  Use the antiinflammatory gel on it 2-3 x a day Rest Elevate If not better in 1-2 week get xray Many things can cause foot pain. Some common causes are:  An injury.  A sprain.  Arthritis.  Blisters.  Bunions. Follow these instructions at home: Pay attention to any changes in your symptoms. Take these actions to help with your discomfort:  If directed, put ice on the affected area:  Put ice in a plastic bag.  Place a towel between your skin and the bag.  Leave the ice on for 15-20 minutes, 3?4 times a day for 2 days.  Take over-the-counter and prescription medicines only as told by your health care provider.  Wear comfortable, supportive shoes that fit you well. Do not wear high heels.  Do not stand or walk for long periods of time.  Do not lift a lot of weight. This can put added pressure on your feet.  Do stretches to relieve foot pain and stiffness as told by your health care provider.  Rub your foot gently.  Keep your feet clean and dry. Contact a health care provider if:  Your pain does not get better after a few days of self-care.  Your pain gets worse.  You cannot stand on your foot. Get help right away if:  Your foot is numb or tingling.  Your foot or toes are swollen.  Your foot or toes turn white or blue.  You have warmth and redness along your foot. This information is not intended to replace advice given to you by your health care provider. Make sure you discuss any questions you have with your health care provider. Document Released: 11/14/2015 Document Revised: 03/25/2016 Document Reviewed: 11/13/2014 Elsevier Interactive Patient Education  2017 [employer].

## 2017-02-15 NOTE — Progress Notes (Signed)
Subjective:    Patient ID: Tanya [name], female    DOB: [birth year], 56 y.o.   MRN: [id number]  HPI 56 y.o. obese AA female presents with right foot swelling. She states she has had top of her right foot with swelling x 7-10 days, no injury, no pain, no redness, warmth.   BMI is Body mass index is 40.72 kg/m., she is working on diet and exercise. Wt Readings from Last 3 Encounters:  02/15/17 237 lb 3.2 oz (107.6 kg)  01/11/17 236 lb (107 kg)  09/02/16 237 lb (107.5 kg)    Blood pressure 136/90, pulse 93, temperature 97.7 F (36.5 C), resp. rate 14, height  (1.626 m), weight 237 lb 3.2 oz (107.6 kg), SpO2 98 %.  Medications Current Outpatient Prescriptions on File Prior to Visit  Medication Sig  . aspirin EC 81 MG tablet Take 81 mg by mouth daily.    . B Complex-C (SUPER B COMPLEX PO) Take by mouth daily.  . bisoprolol-hydrochlorothiazide (ZIAC) 10-6.25 MG tablet TAKE 1 TABLET BY MOUTH EVERY DAY  . Cholecalciferol (VITAMIN D PO) Take 5,000 Int'l Units by mouth 3 (three) times a week.   . cloNIDine (CATAPRES) 0.2 MG tablet TAKE 1 TABLET TWICE A DAY  . diclofenac sodium (VOLTAREN) 1 % GEL Apply 2 g topically 4 (four) times daily.  . furosemide (LASIX) 40 MG tablet TAKE 1 TABLET BY MOUTH TWICE A DAY FOR BLOOD PRESSURE & FLUID RETENTION  . hydrALAZINE (APRESOLINE) 25 MG tablet Take 1 tablet (25 mg total) by mouth 3 (three) times daily.  Marland Kitchen losartan (COZAAR) 100 MG tablet TAKE 1 TABLET (100 MG TOTAL) BY MOUTH DAILY.  . Magnesium 250 MG TABS Take 250 mg by mouth daily.  . metFORMIN (GLUCOPHAGE-XR) 500 MG 24 hr tablet TAKE 2 TABLETS (1,000 MG TOTAL) BY MOUTH 2 (TWO) TIMES DAILY.  . Multiple Vitamins-Minerals (MULTIVITAMIN PO) Take by mouth daily.  . Vitamin D, Ergocalciferol, (DRISDOL) 50000 units CAPS capsule TAKE ONE CAPSULE BY MOUTH TWICE A WEEK   No current facility-administered medications on file prior to visit.     Problem list She has Hypertension; Hyperlipidemia;  Diabetes mellitus without complication (HCC); Allergy; OSA (obstructive sleep apnea); Goiter; Sarcoid; Vitamin D deficiency; Morbid obesity (HCC); and Palpitations on her problem list.   Review of Systems  Constitutional: Negative.   HENT: Negative.   Respiratory: Negative.   Cardiovascular: Negative.  Negative for leg swelling.  Gastrointestinal: Negative.   Genitourinary: Negative.   Musculoskeletal: Negative.   Skin: Negative.   Neurological: Negative.   Hematological: Negative.   Psychiatric/Behavioral: Negative.        Objective:   Physical Exam  Constitutional: She is oriented to person, place, and time. She appears well-developed and well-nourished.  HENT:  Head: Normocephalic and atraumatic.  Right Ear: External ear normal.  Left Ear: External ear normal.  Mouth/Throat: Oropharynx is clear and moist.  Eyes: Conjunctivae and EOM are normal. Pupils are equal, round, and reactive to light.  Neck: Normal range of motion. Neck supple. No thyromegaly present.  Cardiovascular: Normal rate, regular rhythm and normal heart sounds.  Exam reveals no gallop and no friction rub.   No murmur heard. Pulmonary/Chest: Effort normal and breath sounds normal. No respiratory distress. She has no wheezes.  Abdominal: Soft. Bowel sounds are normal. She exhibits no distension and no mass. There is no tenderness. There is no rebound and no guarding.  Musculoskeletal: Normal range of motion. She exhibits no tenderness.  Right  forefoot with mild swelling, good distal neurovascular exam, nontender, no warmth, redness. Ankle full ROM without pain. Neg homen's, no swelling.   Lymphadenopathy:    She has no cervical adenopathy.  Neurological: She is alert and oriented to person, place, and time. She displays normal reflexes. No cranial nerve deficit. Coordination normal.  Skin: Skin is warm and dry.  Psychiatric: She has a normal mood and affect.      Assessment & Plan:  1. Pain and swelling of  toe of right foot Likely OA, rule out gout, infection, use gel, RICE, if not better get Xray Weight loss advised - CBC with Differential/Platelet - Uric acid - DG Foot Complete Right; Future  Call if symptoms are worse  Future Appointments Date Time Provider Department Center  05/18/2017 9:00 AM Terri Piedra, PA-C [id number] None

## 2017-02-16 LAB — URIC ACID: URIC ACID, SERUM: 4 mg/dL (ref 2.5–7.0)

## 2017-03-01 ENCOUNTER — Encounter: Payer: Self-pay | Admitting: Internal Medicine

## 2017-03-01 ENCOUNTER — Ambulatory Visit (INDEPENDENT_AMBULATORY_CARE_PROVIDER_SITE_OTHER): Payer: 59 | Admitting: Internal Medicine

## 2017-03-01 VITALS — BP 158/88 | HR 64 | Temp 97.7°F | Resp 16 | Ht 64.0 in | Wt 237.4 lb

## 2017-03-01 DIAGNOSIS — G5711 Meralgia paresthetica, right lower limb: Secondary | ICD-10-CM | POA: Diagnosis not present

## 2017-03-01 MED ORDER — GABAPENTIN 100 MG PO CAPS: ORAL_CAPSULE | ORAL | 2 refills | Status: DC

## 2017-03-01 NOTE — Patient Instructions (Signed)
Meralgia Paraesthetica  ?

## 2017-03-01 NOTE — Progress Notes (Signed)
  Subjective:    Patient ID: Tanya [name], female    DOB: 1960-11-26, 56 y.o.   MRN: [id number]  HPI  This very nice 56 yo DBF with WUJ([id number]), HLD, T2_DM (2008) presents with several week hx/o burning dysthesias in the anterolateral Rt thigh distribution of the lateral femoral cutaneous nerve distribution. Rates the pain at a 4-5 /10. Otilio Carpen keeps her awake at night.   Medication Sig  . aspirin EC 81 MG tablet Take 81 mg by mouth daily.    . B Complex-C (SUPER B COMPLEX PO) Take by mouth daily.  . bisoprolol-hydrochlorothiazide (ZIAC) 10-6.25 MG tablet TAKE 1 TABLET BY MOUTH EVERY DAY  . Cholecalciferol (VITAMIN D PO) Take 5,000 Int'l Units by mouth 3 (three) times a week.   . cloNIDine (CATAPRES) 0.2 MG tablet TAKE 1 TABLET TWICE A DAY  . diclofenac sodium (VOLTAREN) 1 % GEL Apply 2 g topically 4 (four) times daily.  . furosemide (LASIX) 40 MG tablet TAKE 1 TABLET BY MOUTH TWICE A DAY FOR BLOOD PRESSURE & FLUID RETENTION  . hydrALAZINE (APRESOLINE) 25 MG tablet Take 1 tablet (25 mg total) by mouth 3 (three) times daily.  Marland Kitchen losartan (COZAAR) 100 MG tablet TAKE 1 TABLET (100 MG TOTAL) BY MOUTH DAILY.  . Magnesium 250 MG TABS Take 250 mg by mouth daily.  . metFORMIN (GLUCOPHAGE-XR) 500 MG 24 hr tablet TAKE 2 TABLETS (1,000 MG TOTAL) BY MOUTH 2 (TWO) TIMES DAILY.  . Multiple Vitamins-Minerals (MULTIVITAMIN PO) Take by mouth daily.  . Vitamin D, Ergocalciferol, (DRISDOL) 50000 units CAPS capsule TAKE ONE CAPSULE BY MOUTH TWICE A WEEK   Allergies  Allergen Reactions  . Amlodipine Swelling  . Naproxen Hives  . Meloxicam & Diet Manage Prod Rash   Past Medical History:  Diagnosis Date  . Allergy   . Diabetes mellitus   . Goiter   . Hyperlipidemia   . Hypertension   . Morbid obesity (HCC) 12/24/2013  . OSA (obstructive sleep apnea)   . Palpitations 08/01/2016  . Sarcoid   . Sarcoidosis   . Vitamin D deficiency    Review of Systems  10 point systems review negative except as above.      Objective:   Physical Exam  BP (!) 158/88   Pulse 64   Temp 97.7 F (36.5 C)   Resp 16   Ht  (1.626 m)   Wt 237 lb 6.4 oz (107.7 kg)   BMI 40.75 kg/m   HEENT - WNL Neck - supple. Nl Thyroid. Carotids 2+ & No bruits, nodes, JVD Chest - Clear equal BS. Cor - Nl HS. RRR w/o sig. m. No edema. MS- FROM w/o deformities. Muscle power, tone and bulk Nl. Gait Nl. Neuro - No obvious Cr N abnormalities. Nl w/o focal abnormalities. Cutaneous hypersensitivity to touch over the R anterolateral thigh.     Assessment & Plan:   1. Meralgia paresthetica of right side  - gabapentin (NEURONTIN) 100 MG capsule; Take 1 capsule 3 x/ day for Neuralgia  Dispense: 90 capsule; Refill: 2  - discussed meds/SE's, prudent weight reduction diet and recommended regular exercise.

## 2017-03-15 ENCOUNTER — Other Ambulatory Visit: Payer: Self-pay | Admitting: Internal Medicine

## 2017-03-16 ENCOUNTER — Telehealth: Payer: Self-pay | Admitting: *Deleted

## 2017-03-16 NOTE — Telephone Encounter (Signed)
Patient called and stated she is still having right leg /hip pain.  She is taking Gabapentin 100 mg tid.  Per Dr Oneta RackMcKeown, increase the Gabapentin to 2 tablets tid for a few days and if the pain continues, she can go to 3 tablets tid.  The patient will try increasing the RX and call back with the result.

## 2017-03-19 ENCOUNTER — Other Ambulatory Visit: Payer: Self-pay | Admitting: Internal Medicine

## 2017-04-01 ENCOUNTER — Other Ambulatory Visit: Payer: Self-pay | Admitting: Internal Medicine

## 2017-04-29 ENCOUNTER — Other Ambulatory Visit: Payer: Self-pay | Admitting: *Deleted

## 2017-04-29 MED ORDER — GABAPENTIN 300 MG PO CAPS: ORAL_CAPSULE | ORAL | 2 refills | Status: DC

## 2017-05-07 ENCOUNTER — Other Ambulatory Visit: Payer: Self-pay | Admitting: Internal Medicine

## 2017-05-18 ENCOUNTER — Encounter: Payer: Self-pay | Admitting: Internal Medicine

## 2017-05-18 DIAGNOSIS — G4733 Obstructive sleep apnea (adult) (pediatric): Secondary | ICD-10-CM | POA: Diagnosis not present

## 2017-05-19 ENCOUNTER — Other Ambulatory Visit: Payer: Self-pay | Admitting: Internal Medicine

## 2017-05-20 DIAGNOSIS — I771 Stricture of artery: Secondary | ICD-10-CM | POA: Insufficient documentation

## 2017-05-20 NOTE — Progress Notes (Signed)
Complete Physical  Assessment and Plan: Encounter for general adult medical examination with abnormal findings  Essential hypertension - continue medications, DASH diet, exercise and monitor at home. Call if greater than 130/80. -     CBC with Differential/Platelet -     BASIC METABOLIC PANEL WITH GFR -     Hepatic function panel -     TSH -     Urinalysis, Routine w reflex microscopic -     Microalbumin / creatinine urine ratio -     EKG 12-Lead  Diabetes mellitus without complication (HCC) Discussed general issues about diabetes pathophysiology and management., Educational material distributed., Suggested low cholesterol diet., Encouraged aerobic exercise., Discussed foot care., Reminded to get yearly retinal exam. -     Hemoglobin A1c  OSA (obstructive sleep apnea) Sleep apnea- continue CPAP, CPAP is helping with daytime fatigue, weight loss still advised.   Mixed hyperlipidemia -continue medications, check lipids, decrease fatty foods, increase activity. -     Lipid panel  Obesity, Class III, BMI 40-49.9 (morbid obesity) (HCC) - long discussion about weight loss, diet, and exercise  Palpitations Control triggers  Vitamin D deficiency  Goiter  Meralgia paresthetica of right side -     Ambulatory referral to Orthopedics -     gabapentin (NEURONTIN) 100 MG capsule; Take 1 capsule 2 to 3 times a day.  Allergic state, initial encounter  Tortuous aorta (HCC) Control blood pressure, cholesterol, glucose, increase exercise.   Chronic right-sided low back pain without sciatica -     Ambulatory referral to Orthopedics -     gabapentin (NEURONTIN) 100 MG capsule; Take 1 capsule 2 to 3 times a day.  Medication management -     Magnesium  Anemia, unspecified type -     Iron and TIBC -     Vitamin B12  Discussed med's effects and SE's. Screening labs and tests as requested with regular follow-up as recommended. Over 40 minutes of exam, counseling, chart review, and  complex, high level critical decision making was performed this visit.   HPI  56 y.o. female  presents for a complete physical and follow up for has Hypertension; Hyperlipidemia; Diabetes mellitus without complication (HCC); Allergy; OSA (obstructive sleep apnea); Goiter; Sarcoid; Vitamin D deficiency; Obesity, Class III, BMI 40-49.9 (morbid obesity) (HCC); Palpitations; Meralgia paresthetica of right side; and Tortuous aorta (HCC) on her problem list..  Her blood pressure has been controlled at home, today their BP is BP: 130/88 She does not workout. She denies chest pain, shortness of breath, dizziness. Normal Echo 2017 She has a history of sarcoidosis, diagnosed in 2012 due to orbital abnormality on CT scan, continues to follow up with Dr. Ophelia Charter in [place] retinal specialist, had normal CT chest 2012, normal CXR 2017.  She is on cholesterol medication and denies myalgias. Her cholesterol is at goal. The cholesterol last visit was:   Lab Results  Component Value Date   CHOL 182 01/11/2017   HDL 100 01/11/2017   LDLCALC 56 01/11/2017   TRIG 131 01/11/2017   CHOLHDL 1.8 01/11/2017   She has been working on diet and exercise for Diabetes without complications, she is on bASA, she is on ACE/ARB, does not check her sugars at home, and denies nausea, paresthesia of the feet, polydipsia and polyuria. Last A1C was:  Lab Results  Component Value Date   HGBA1C 6.8 (H) 01/11/2017   Last GFR: Lab Results  Component Value Date   GFRAA >89 01/11/2017   Patient is on  Vitamin D supplement.   Lab Results  Component Value Date   VD25OH 98 09/02/2016     BMI is Body mass index is 38.27 kg/m., she is working on diet and exercise. She has OSA, on CPAP.  Wt Readings from Last 3 Encounters:  05/23/17 230 lb (104.3 kg)  03/01/17 237 lb 6.4 oz (107.7 kg)  02/15/17 237 lb 3.2 oz (107.6 kg)   She continues to have right hip/back pain, and has bilateral knee pain, will refer to Dr. Magnus IvanBlackman.   Current  Medications:  Current Outpatient Prescriptions on File Prior to Visit  Medication Sig Dispense Refill  . aspirin EC 81 MG tablet Take 81 mg by mouth daily.      . B Complex-C (SUPER B COMPLEX PO) Take by mouth daily.    . bisoprolol-hydrochlorothiazide (ZIAC) 10-6.25 MG tablet TAKE 1 TABLET BY MOUTH EVERY DAY 90 tablet 1  . Cholecalciferol (VITAMIN D PO) Take 5,000 Int'l Units by mouth 3 (three) times a week.     . cloNIDine (CATAPRES) 0.2 MG tablet TAKE 1 TABLET TWICE A DAY 180 tablet 0  . diclofenac sodium (VOLTAREN) 1 % GEL Apply 2 g topically 4 (four) times daily. 100 g 3  . furosemide (LASIX) 40 MG tablet TAKE 1 TABLET BY MOUTH TWICE A DAY FOR BLOOD PRESSURE & FLUID RETENTION 180 tablet 1  . gabapentin (NEURONTIN) 300 MG capsule Take 1 capsule 2 to 3 times a day. 90 capsule 2  . hydrALAZINE (APRESOLINE) 25 MG tablet Take 1 tablet (25 mg total) by mouth 3 (three) times daily. 270 tablet 1  . losartan (COZAAR) 100 MG tablet TAKE 1 TABLET (100 MG TOTAL) BY MOUTH DAILY. 90 tablet 1  . Magnesium 250 MG TABS Take 250 mg by mouth daily.    . metFORMIN (GLUCOPHAGE-XR) 500 MG 24 hr tablet TAKE 2 TABLETS (1,000 MG TOTAL) BY MOUTH 2 (TWO) TIMES DAILY. 360 tablet 1  . Multiple Vitamins-Minerals (MULTIVITAMIN PO) Take by mouth daily.    . Vitamin D, Ergocalciferol, (DRISDOL) 50000 units CAPS capsule TAKE ONE CAPSULE BY MOUTH TWICE A WEEK 8 capsule 3   No current facility-administered medications on file prior to visit.    Allergies:  Allergies  Allergen Reactions  . Amlodipine Swelling  . Naproxen Hives  . Meloxicam & Diet Manage Prod Rash   Medical History:  She has Hypertension; Hyperlipidemia; Diabetes mellitus without complication (HCC); Allergy; OSA (obstructive sleep apnea); Goiter; Sarcoid; Vitamin D deficiency; Obesity, Class III, BMI 40-49.9 (morbid obesity) (HCC); Palpitations; Meralgia paresthetica of right side; and Tortuous aorta (HCC) on her problem list.  Health Maintenance:    Immunization History  Administered Date(s) Administered  . Influenza Split 07/18/2013, 10/01/2014  . Influenza,inj,quad, With Preservative 09/02/2016  . Pneumococcal Polysaccharide-23 06/16/1999  . Tdap 03/10/2009   Tetanus: 2010 Pneumovax: 2000 Flu vaccine:2015 Shingles: DUE, wants to call and ask LMP:  Hysterectomy MGM:  2018 Colonoscopy: 2014 due 10 years Echo 2017  Last Eye Exam: Dr. Hyacinth MeekerMiller Patient Care Team: Lucky CowboyMcKeown, William, MD as PCP - General (Internal Medicine)  Surgical History:  She has a past surgical history that includes Eye surgery; achiles tendon; Abdominal hysterectomy; and Tonsillectomy. Family History:  Herfamily history includes Diabetes in her father and paternal grandmother; Heart disease in her father, paternal grandmother, and sister; Hypertension in her brother, mother, and sister. Social History:  She reports that she has never smoked. She has never used smokeless tobacco. She reports that she does not drink alcohol or  use drugs.  Review of Systems: Review of Systems  Constitutional: Negative for chills, fever and malaise/fatigue.  HENT: Negative for congestion, ear pain and sore throat.   Eyes: Negative.   Respiratory: Negative for cough, shortness of breath and wheezing.   Cardiovascular: Negative for chest pain, palpitations and leg swelling.  Gastrointestinal: Negative for abdominal pain, blood in stool, constipation, diarrhea, heartburn and melena.  Genitourinary: Negative.   Musculoskeletal: Positive for back pain and joint pain. Negative for falls, myalgias and neck pain.  Skin: Negative.   Neurological: Negative for dizziness, sensory change, loss of consciousness and headaches.  Psychiatric/Behavioral: Negative for depression. The patient is not nervous/anxious and does not have insomnia.     Physical Exam: Estimated body mass index is 38.27 kg/m as calculated from the following:   Height as of this encounter: 5\' 5"  (1.651 m).    Weight as of this encounter: 230 lb (104.3 kg). BP 130/88   Pulse 64   Temp (!) 97.5 F (36.4 C)   Resp 16   Ht 5\' 5"  (1.651 m)   Wt 230 lb (104.3 kg)   SpO2 98%   BMI 38.27 kg/m  General Appearance: Well nourished, in no apparent distress.  Eyes: PERRLA, EOMs, conjunctiva no swelling or erythema, normal fundi and vessels.  Sinuses: No Frontal/maxillary tenderness  ENT/Mouth: Ext aud canals clear, normal light reflex with TMs without erythema, bulging. Good dentition. No erythema, swelling, or exudate on post pharynx. Tonsils not swollen or erythematous. Hearing normal.  Neck: Supple, thyroid normal. No bruits  Respiratory: Respiratory effort normal, BS equal bilaterally without rales, rhonchi, wheezing or stridor.  Cardio: RRR without murmurs, rubs or gallops. Brisk peripheral pulses without edema.  Chest: symmetric, with normal excursions and percussion.  Breasts: Symmetric, without lumps, nipple discharge, retractions.  Abdomen: Soft, nontender, no guarding, rebound, hernias, masses, or organomegaly.  Lymphatics: Non tender without lymphadenopathy.  Genitourinary:  Musculoskeletal: Full ROM all peripheral extremities,5/5 strength, and normal gait.  Skin: Warm, dry without rashes, lesions, ecchymosis. Neuro: Cranial nerves intact, reflexes equal bilaterally. Normal muscle tone, no cerebellar symptoms. Sensation intact.  Psych: Awake and oriented X 3, normal affect, Insight and Judgment appropriate.   EKG: WNL no ST changes. AORTA SCAN: WNL   Quentin Mulling 3:25 PM [facility]

## 2017-05-23 ENCOUNTER — Encounter: Payer: Self-pay | Admitting: Physician Assistant

## 2017-05-23 ENCOUNTER — Ambulatory Visit (INDEPENDENT_AMBULATORY_CARE_PROVIDER_SITE_OTHER): Payer: 59 | Admitting: Physician Assistant

## 2017-05-23 VITALS — BP 130/88 | HR 64 | Temp 97.5°F | Resp 16 | Ht 65.0 in | Wt 230.0 lb

## 2017-05-23 DIAGNOSIS — E119 Type 2 diabetes mellitus without complications: Secondary | ICD-10-CM | POA: Diagnosis not present

## 2017-05-23 DIAGNOSIS — M545 Low back pain, unspecified: Secondary | ICD-10-CM

## 2017-05-23 DIAGNOSIS — E559 Vitamin D deficiency, unspecified: Secondary | ICD-10-CM

## 2017-05-23 DIAGNOSIS — I771 Stricture of artery: Secondary | ICD-10-CM | POA: Diagnosis not present

## 2017-05-23 DIAGNOSIS — D649 Anemia, unspecified: Secondary | ICD-10-CM | POA: Diagnosis not present

## 2017-05-23 DIAGNOSIS — G4733 Obstructive sleep apnea (adult) (pediatric): Secondary | ICD-10-CM

## 2017-05-23 DIAGNOSIS — G5711 Meralgia paresthetica, right lower limb: Secondary | ICD-10-CM | POA: Diagnosis not present

## 2017-05-23 DIAGNOSIS — Z0001 Encounter for general adult medical examination with abnormal findings: Secondary | ICD-10-CM | POA: Diagnosis not present

## 2017-05-23 DIAGNOSIS — I10 Essential (primary) hypertension: Secondary | ICD-10-CM

## 2017-05-23 DIAGNOSIS — R002 Palpitations: Secondary | ICD-10-CM | POA: Diagnosis not present

## 2017-05-23 DIAGNOSIS — E782 Mixed hyperlipidemia: Secondary | ICD-10-CM | POA: Diagnosis not present

## 2017-05-23 DIAGNOSIS — E049 Nontoxic goiter, unspecified: Secondary | ICD-10-CM | POA: Diagnosis not present

## 2017-05-23 DIAGNOSIS — Z79899 Other long term (current) drug therapy: Secondary | ICD-10-CM

## 2017-05-23 DIAGNOSIS — G8929 Other chronic pain: Secondary | ICD-10-CM

## 2017-05-23 DIAGNOSIS — T7840XA Allergy, unspecified, initial encounter: Secondary | ICD-10-CM

## 2017-05-23 DIAGNOSIS — R6889 Other general symptoms and signs: Secondary | ICD-10-CM | POA: Diagnosis not present

## 2017-05-23 LAB — CBC WITH DIFFERENTIAL/PLATELET
BASOS PCT: 0 %
Basophils Absolute: 0 cells/uL (ref 0–200)
EOS ABS: 134 {cells}/uL (ref 15–500)
Eosinophils Relative: 2 %
HEMATOCRIT: 37.3 % (ref 35.0–45.0)
Hemoglobin: 11.9 g/dL (ref 11.7–15.5)
LYMPHS ABS: 3953 {cells}/uL — AB (ref 850–3900)
Lymphocytes Relative: 59 %
MCH: 25.5 pg — ABNORMAL LOW (ref 27.0–33.0)
MCHC: 31.9 g/dL — ABNORMAL LOW (ref 32.0–36.0)
MCV: 80 fL (ref 80.0–100.0)
MPV: 10.2 fL (ref 7.5–12.5)
Monocytes Absolute: 536 cells/uL (ref 200–950)
Monocytes Relative: 8 %
Neutro Abs: 2077 cells/uL (ref 1500–7800)
Neutrophils Relative %: 31 %
Platelets: 402 10*3/uL — ABNORMAL HIGH (ref 140–400)
RBC: 4.66 MIL/uL (ref 3.80–5.10)
RDW: 15.4 % — ABNORMAL HIGH (ref 11.0–15.0)
WBC: 6.7 10*3/uL (ref 3.8–10.8)

## 2017-05-23 LAB — TSH: TSH: 0.63 mIU/L

## 2017-05-23 MED ORDER — GABAPENTIN 100 MG PO CAPS: ORAL_CAPSULE | ORAL | 2 refills | Status: DC

## 2017-05-23 NOTE — Patient Instructions (Addendum)
Follow up ortho for knees and right hip/back.   Varicose Veins Varicose veins are veins that have become enlarged and twisted. CAUSES This condition is the result of valves in the veins not working properly. Valves in the veins help return blood from the leg to the heart. When your calf muscles squeeze, the blood moves up your leg then the valves close and this continues until the blood gets back to your heart.  If these valves are damaged, blood flows backwards and backs up into the veins in the leg near the skin OR if your are sitting/standing for a long time without using your calf muscles the blood will back up into the veins in your legs. This causes the veins to become larger. People who are on their feet a lot, sit a lot without walking (like on a plane, at a desk, or in a car), who are pregnant, or who are overweight are more likely to develop varicose veins. SYMPTOMS   Bulging, twisted-appearing, bluish veins, most commonly found on the legs.  Leg pain or a feeling of heaviness. These symptoms may be worse at the end of the day.  Leg swelling.  Skin color changes. DIAGNOSIS  Varicose veins can usually be diagnosed with an exam of your legs by your caregiver. He or she may recommend an ultrasound of your leg veins. TREATMENT  Most varicose veins can be treated at home.However, other treatments are available for people who have persistent symptoms or who want to treat the cosmetic appearance of the varicose veins. But this is only cosmetic and they will return if not properly treated. These include:  Laser treatment of very small varicose veins.  Medicine that is shot (injected) into the vein. This medicine hardens the walls of the vein and closes off the vein. This treatment is called sclerotherapy. Afterwards, you may need to wear clothing or bandages that apply pressure.  Surgery. HOME CARE INSTRUCTIONS   Do not stand or sit in one position for long periods of time. Do not sit  with your legs crossed. Rest with your legs raised during the day.  Your legs have to be higher than your heart so that gravity will force the valves to open, so please really elevate your legs.   Wear elastic stockings or support hose. Do not wear other tight, encircling garments around the legs, pelvis, or waist.  [employer]  has a wide variety of well priced compression stockings. [address], [place] [id number] [phone]  OR THERE ARE COPPER INFUSED COMPRESSION SOCKS AT [facility] OR [facility]  Walk as much as possible to increase blood flow.  Raise the foot of your bed at night with 2-inch blocks.  If you get a cut in the skin over the vein and the vein bleeds, lie down with your leg raised and press on it with a clean cloth until the bleeding stops. Then place a bandage (dressing) on the cut. See your caregiver if it continues to bleed or needs stitches. SEEK MEDICAL CARE IF:   The skin around your ankle starts to break down.  You have pain, redness, tenderness, or hard swelling developing in your leg over a vein.  You are uncomfortable due to leg pain. Document Released: 07/28/2005 Document Revised: 01/10/2012 Document Reviewed: 12/14/2010 [facility] Patient Information 2014 [employer] [place].  Diabetes is a very complicated disease...lets simplify it.  An easy way to look at it to understand the complications is if you think of the  extra sugar floating in your blood stream as glass shards floating through your blood stream.    Diabetes affects your small vessels first: 1) The glass shards (sugar) scraps down the tiny blood vessels in your eyes and lead to diabetic retinopathy, the leading cause of blindness in the [place]. Diabetes is the leading cause of newly diagnosed adult (81 to 57 years of age) blindness in the [place].  2) The glass shards scratches down the tiny vessels of your legs leading to nerve damage called neuropathy and can lead to amputations of your  feet. More than 60% of all non-traumatic amputations of lower limbs occur in people with diabetes.  3) Over time the small vessels in your brain are shredded and closed off, individually this does not cause any problems but over a long period of time many of the small vessels being blocked can lead to Vascular Dementia.   4) Your kidney's are a filter system and have a "net" that keeps certain things in the body and lets bad things out. Sugar shreds this net and leads to kidney damage and eventually failure. Decreasing the sugar that is destroying the net and certain blood pressure medications can help stop or decrease progression of kidney disease. Diabetes was the primary cause of kidney failure in 44 percent of all new cases in 2011.  5) Diabetes also destroys the small vessels in your penis that lead to erectile dysfunction. Eventually the vessels are so damaged that you may not be responsive to cialis or viagra.   Diabetes and your large vessels: Your larger vessels consist of your coronary arteries in your heart and the carotid vessels to your brain. Diabetes or even increased sugars put you at 300% increased risk of heart attack and stroke and this is why.. The sugar scrapes down your large blood vessels and your body sees this as an internal injury and tries to repair itself. Just like you get a scab on your skin, your platelets will stick to the blood vessel wall trying to heal it. This is why we have diabetics on low dose aspirin daily, this prevents the platelets from sticking and can prevent plaque formation. In addition, your body takes cholesterol and tries to shove it into the open wound. This is why we want your LDL, or bad cholesterol, below 70.   The combination of platelets and cholesterol over 5-10 years forms plaque that can break off and cause a heart attack or stroke.   PLEASE REMEMBER:  Diabetes is preventable! Up to 85 percent of complications and morbidities among individuals  with type 2 diabetes can be prevented, delayed, or effectively treated and minimized with regular visits to a health professional, appropriate monitoring and medication, and a healthy diet and lifestyle.     Bad carbs also include fruit juice, alcohol, and sweet tea. These are empty calories that do not signal to your brain that you are full.   Please remember the good carbs are still carbs which convert into sugar. So please measure them out no more than 1/2-1 cup of rice, oatmeal, pasta, and beans  Veggies are however free foods! Pile them on.   Not all fruit is created equal. Please see the list below, the fruit at the bottom is higher in sugars than the fruit at the top. Please avoid all dried fruits.     Drink 80-100 oz a day of water, measure it out Eat 3 meals a day, have to do breakfast, eat protein-  hard boiled eggs, protein bar like nature valley protein bar, greek yogurt like oikos triple zero, chobani 100, or light n fit greek

## 2017-05-24 LAB — BASIC METABOLIC PANEL WITH GFR
BUN: 13 mg/dL (ref 7–25)
CHLORIDE: 97 mmol/L — AB (ref 98–110)
CO2: 29 mmol/L (ref 20–31)
Calcium: 9.6 mg/dL (ref 8.6–10.4)
Creat: 0.54 mg/dL (ref 0.50–1.05)
GFR, Est African American: >89 mL/min (ref >=60)
GFR, Est Non African American: >89 mL/min (ref >=60)
GLUCOSE: 92 mg/dL (ref 65–99)
POTASSIUM: 4.1 mmol/L (ref 3.5–5.3)
Sodium: 138 mmol/L (ref 135–146)

## 2017-05-24 LAB — HEPATIC FUNCTION PANEL
ALBUMIN: 4 g/dL (ref 3.6–5.1)
ALK PHOS: 65 U/L (ref 33–130)
ALT: 25 U/L (ref 6–29)
AST: 19 U/L (ref 10–35)
BILIRUBIN INDIRECT: 0.3 mg/dL (ref 0.2–1.2)
BILIRUBIN TOTAL: 0.4 mg/dL (ref 0.2–1.2)
Bilirubin, Direct: 0.1 mg/dL (ref <=0.2)
TOTAL PROTEIN: 6.9 g/dL (ref 6.1–8.1)

## 2017-05-24 LAB — MICROALBUMIN / CREATININE URINE RATIO: CREATININE, URINE: 35 mg/dL (ref 20–320)

## 2017-05-24 LAB — URINALYSIS, ROUTINE W REFLEX MICROSCOPIC
BILIRUBIN URINE: NEGATIVE
GLUCOSE, UA: NEGATIVE
Hgb urine dipstick: NEGATIVE
KETONES UR: NEGATIVE
Leukocytes, UA: NEGATIVE
Nitrite: NEGATIVE
Protein, ur: NEGATIVE
SPECIFIC GRAVITY, URINE: 1.007 (ref 1.001–1.035)
pH: 7 (ref 5.0–8.0)

## 2017-05-24 LAB — LIPID PANEL
Cholesterol: 182 mg/dL (ref <200)
HDL: 97 mg/dL (ref >50)
LDL CALC: 60 mg/dL (ref <100)
Total CHOL/HDL Ratio: 1.9 Ratio (ref <5.0)
Triglycerides: 126 mg/dL (ref <150)
VLDL: 25 mg/dL (ref <30)

## 2017-05-24 LAB — VITAMIN B12: Vitamin B-12: 375 pg/mL (ref 200–1100)

## 2017-05-24 LAB — MAGNESIUM: Magnesium: 1.8 mg/dL (ref 1.5–2.5)

## 2017-05-24 LAB — HEMOGLOBIN A1C
HEMOGLOBIN A1C: 6.7 % — AB (ref <5.7)
Mean Plasma Glucose: 146 mg/dL

## 2017-05-24 LAB — IRON AND TIBC
%SAT: 23 % (ref 11–50)
IRON: 85 ug/dL (ref 45–160)
TIBC: 369 ug/dL (ref 250–450)
UIBC: 284 ug/dL

## 2017-05-28 DIAGNOSIS — J0100 Acute maxillary sinusitis, unspecified: Secondary | ICD-10-CM | POA: Diagnosis not present

## 2017-06-01 ENCOUNTER — Telehealth (INDEPENDENT_AMBULATORY_CARE_PROVIDER_SITE_OTHER): Payer: Self-pay | Admitting: *Deleted

## 2017-06-01 ENCOUNTER — Ambulatory Visit (INDEPENDENT_AMBULATORY_CARE_PROVIDER_SITE_OTHER): Payer: 59 | Admitting: Orthopaedic Surgery

## 2017-06-01 ENCOUNTER — Ambulatory Visit (INDEPENDENT_AMBULATORY_CARE_PROVIDER_SITE_OTHER): Payer: 59

## 2017-06-01 DIAGNOSIS — M79604 Pain in right leg: Secondary | ICD-10-CM

## 2017-06-01 DIAGNOSIS — M5441 Lumbago with sciatica, right side: Secondary | ICD-10-CM | POA: Diagnosis not present

## 2017-06-01 DIAGNOSIS — G8929 Other chronic pain: Secondary | ICD-10-CM | POA: Insufficient documentation

## 2017-06-01 NOTE — Progress Notes (Signed)
Office Visit Note   Patient: Tanya [name]           Date of Birth: 08/27/1961           MRN: [id number] Visit Date: 06/01/2017              Requested by: Quentin Mullingollier, Amanda, PA-C [address] [place], [place] [id number] PCP: Lucky CowboyMcKeown, William, MD   Assessment & Plan: Visit Diagnoses:  1. Pain in right leg   2. Acute right-sided low back pain with right-sided sciatica     Plan: She agrees that physical therapy would be helpful to try with working on back extension exercises and McKenzie exercises as well as any other modalities to decrease her pain and improve her function. They can try e-stim as well as iontophoresis and phonophoresis and even consider a TENS unit. She will take 2 Aleve twice a day for at least the next week and will continue her gabapentin. All questions were encouraged and answered.   Follow-Up Instructions: Return in about 4 weeks (around 06/29/2017).   Orders:  Orders Placed This Encounter  Procedures  . XR Lumbar Spine 2-3 Views   No orders of the defined types were placed in this encounter.     Procedures: No procedures performed   Clinical Data: No additional findings.   Subjective: No chief complaint on file. The patient comes in with chief complaint of right leg pain but she points to her right backside and this goes down behind her leg to her knee. She is a diabetic and has had some numbness in her right foot but not her left foot. This is all been going on for about 3-4 months. She's been on gabapentin and that is helped take the edge off of her situation. She says that she's been sitting for long period time and gets up is been some of the pain is the worst. She denies any change in bowel bladder function. She denies any groin pain or knee pain.  HPI  Review of Systems She denies any headache, chest pain, shortness breath, fever, chills, nausea, vomiting  Objective: Vital Signs: There were no vitals taken for this  visit.  Physical Exam She is alert or 3 and in no acute distress she is a very pleasant individual. She gets up on the exam table easily. Ortho Exam Other than sciatica on exam the remainder of the exam is normal. She has negative straight leg raise bilaterally. She has excellent strength in all muscle groups in her bilateral lower extremities. Her hip exam is normal as well. Specialty Comments:  No specialty comments available.  Imaging: Xr Lumbar Spine 2-3 Views  Result Date: 06/01/2017 An AP and lateral of the lumbar spine show no acute findings. The alignment overall maintained and there is minimal disc space narrowing.    PMFS History: Patient Active Problem List   Diagnosis Date Noted  . Acute right-sided low back pain with right-sided sciatica 06/01/2017  . Tortuous aorta (HCC) 05/20/2017  . Meralgia paresthetica of right side 03/01/2017  . Palpitations 08/01/2016  . Obesity, Class III, BMI 40-49.9 (morbid obesity) (HCC) 12/24/2013  . Hypertension   . Hyperlipidemia   . Diabetes mellitus without complication (HCC)   . Allergy   . OSA (obstructive sleep apnea)   . Goiter   . Sarcoid   . Vitamin D deficiency    Past Medical History:  Diagnosis Date  . Allergy   . Diabetes mellitus   . Goiter   .  Hyperlipidemia   . Hypertension   . Morbid obesity (HCC) 12/24/2013  . OSA (obstructive sleep apnea)   . Palpitations 08/01/2016  . Sarcoid   . Sarcoidosis   . Vitamin D deficiency     Family History  Problem Relation Age of Onset  . Hypertension Mother   . Diabetes Father   . Heart disease Father   . Diabetes Paternal Grandmother   . Heart disease Paternal Grandmother   . Hypertension Sister   . Heart disease Sister   . Hypertension Brother   . Colon cancer Neg Hx   . Esophageal cancer Neg Hx   . Rectal cancer Neg Hx   . Stomach cancer Neg Hx     Past Surgical History:  Procedure Laterality Date  . ABDOMINAL HYSTERECTOMY    . achiles tendon     right  .  EYE SURGERY    . TONSILLECTOMY     Social History   Occupational History  . Not on file.   Social History Main Topics  . Smoking status: Never Smoker  . Smokeless tobacco: Never Used  . Alcohol use No  . Drug use: No  . Sexual activity: Not on file

## 2017-06-02 ENCOUNTER — Other Ambulatory Visit (INDEPENDENT_AMBULATORY_CARE_PROVIDER_SITE_OTHER): Payer: Self-pay

## 2017-06-02 DIAGNOSIS — M5442 Lumbago with sciatica, left side: Principal | ICD-10-CM

## 2017-06-02 DIAGNOSIS — G8929 Other chronic pain: Secondary | ICD-10-CM

## 2017-06-06 ENCOUNTER — Other Ambulatory Visit: Payer: Self-pay | Admitting: Internal Medicine

## 2017-06-20 ENCOUNTER — Ambulatory Visit: Payer: 59 | Attending: Internal Medicine | Admitting: Physical Therapy

## 2017-06-20 DIAGNOSIS — M79604 Pain in right leg: Secondary | ICD-10-CM | POA: Insufficient documentation

## 2017-06-20 DIAGNOSIS — M6281 Muscle weakness (generalized): Secondary | ICD-10-CM | POA: Diagnosis not present

## 2017-06-20 NOTE — Therapy (Signed)
[facility] Outpatient Rehabilitation [facility] [address] [place], [place], [address] Phone: [phone]   Fax:  [phone]  Physical Therapy Evaluation  Patient Details  Name: Tanya [name] MRN: [id number] Date of Birth: 07-Aug-1961 Referring Provider: Kathryne Hitch, MD  Encounter Date: 06/20/2017      PT End of Session - 06/20/17 1714    Visit Number 1   Number of Visits 9   Date for PT Re-Evaluation 07/22/17   Authorization Type U[employer]- 20 visit limit   PT Start Time 1631   PT Stop Time 1713   PT Time Calculation (min) 42 min   Activity Tolerance Patient tolerated treatment well   Behavior During Therapy [facility] for tasks assessed/performed      Past Medical History:  Diagnosis Date  . Allergy   . Diabetes mellitus   . Goiter   . Hyperlipidemia   . Hypertension   . Morbid obesity (HCC) 12/24/2013  . OSA (obstructive sleep apnea)   . Palpitations 08/01/2016  . Sarcoid   . Sarcoidosis   . Vitamin D deficiency     Past Surgical History:  Procedure Laterality Date  . ABDOMINAL HYSTERECTOMY    . achiles tendon     right  . EYE SURGERY    . TONSILLECTOMY      There were no vitals filed for this visit.       Subjective Assessment - 06/20/17 1637    Subjective Pt denies pain in back. Feels a throbbing in R thigh that is not constant, sometimes feels lower leg discomfort and pain into R buttock. She is fine when seated. Walks around often at work even though she is seated most of the day.    Currently in Pain? No/denies            [facility] PT Assessment - 06/20/17 0001      Assessment   Medical Diagnosis LBP & R sciatica   Referring Provider Kathryne Hitch, MD   Onset Date/Surgical Date --  3 mo ago   Hand Dominance Right   Next MD Visit 9/4   Prior Therapy not this yer     Precautions   Precaution Comments DM, high BP     Restrictions   Weight Bearing Restrictions No     Balance Screen   Has the patient fallen in  the past 6 months No     Home Environment   Living Environment Private residence   Living Arrangements Spouse/significant other   Additional Comments no stairs at home     Prior Function   Level of Independence Independent   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          [facility] Adult PT Treatment/Exercise - 06/20/17 0001      Exercises   Exercises Knee/Hip  Knee/Hip Exercises: Sidelying   Clams x30 each                     PT Long Term Goals - 06/20/17 1725      PT LONG TERM GOAL #1   Title Hip abduction to 4+/5 bilat for support to lumbopelvic region   Baseline see flowsheet   Time 4   Period Weeks   Status New   Target Date 07/22/17     PT LONG TERM GOAL #2   Title Pt will be able to stand and walk away from her chair without increase in pain   Baseline pain upon standing at eval   Time 4   Period Weeks   Status New   Target Date 07/22/17     PT LONG TERM GOAL #3   Title Pt will be able to walk for exercise without increased throbbing in R thigh   Baseline it's not constant at eval but throbs when walking   Time 4   Period Weeks   Status New   Target Date 07/22/17     PT LONG TERM GOAL #4   Title pt will be independent with HEP for long term care   Baseline will progress as appropriate   Time 4   Period Weeks   Status New   Target Date 07/22/17                Plan - 06/20/17 1717    Clinical  Impression Statement Pt presents to PT with complaints of R thigh pain that is not constant and began about 3 months ago. Notable LLD (R shorter than L) as well as significant weakness in core and hip abductors. Shortened hip flexors. Educated on posture and engagement of supporting musculature and added 2 layer heel lift to R shoe. Pt will benefit from skilled PT in order to improve lumbopelvic stability to provide support to LLD.    History and Personal Factors relevant to plan of care: obesity, DM, high BP   Clinical Presentation Stable   Clinical Presentation due to: n/a   Clinical Decision Making Low   Rehab Potential Good   PT Frequency 2x / week   PT Duration 4 weeks   PT Treatment/Interventions ADLs/Self Care Home Management;Cryotherapy;Electrical Stimulation;Iontophoresis 4mg /ml Dexamethasone;Functional mobility training;Stair training;Gait training;Ultrasound;Moist Heat;Traction;Therapeutic activities;Therapeutic exercise;Balance training;Neuromuscular re-education;Patient/family education;Passive range of motion;Manual techniques;Dry needling;Taping   PT Next Visit Plan how did lift in shoe feel? hip abductor strengthening, core strengthening   PT Home Exercise Plan glut squeeze, abdominal engagement, clam, thomas test stretch;    Consulted and Agree with Plan of Care Patient      Patient will benefit from skilled therapeutic intervention in order to improve the following deficits and impairments:  Abnormal gait, Increased muscle spasms, Decreased activity tolerance, Pain, Improper body mechanics, Impaired flexibility, Decreased strength, Postural dysfunction  Visit Diagnosis: Pain in right leg - Plan: PT plan of care cert/re-cert  Muscle weakness (generalized) - Plan: PT plan of care cert/re-cert     Problem List Patient Active Problem List   Diagnosis Date Noted  . Acute right-sided low back pain with right-sided sciatica 06/01/2017  . Tortuous aorta (HCC) 05/20/2017  .  Meralgia paresthetica of right side 03/01/2017  . Palpitations 08/01/2016  . Obesity, Class III, BMI 40-49.9 (morbid obesity) (HCC) 12/24/2013  . Hypertension   . Hyperlipidemia   . Diabetes mellitus without complication (HCC)   . Allergy   . OSA (obstructive sleep apnea)   .  Goiter   . Sarcoid   . Vitamin D deficiency     Carletta Feasel C. Rozena Fierro PT, DPT 06/20/17 5:36 PM   [facility] Outpatient Rehabilitation [facility] [address] [place], [place], [address] Phone: [phone]   Fax:  [phone]  Name: Tanya [name] MRN: [id number] Date of Birth: [birth year]

## 2017-06-30 ENCOUNTER — Ambulatory Visit: Payer: 59 | Admitting: Physical Therapy

## 2017-06-30 DIAGNOSIS — M6281 Muscle weakness (generalized): Secondary | ICD-10-CM

## 2017-06-30 DIAGNOSIS — M79604 Pain in right leg: Secondary | ICD-10-CM

## 2017-06-30 NOTE — Therapy (Addendum)
[facility] [address] [place], [place], [address] Phone: [phone]   Fax:  [phone]  Physical Therapy Treatment  Patient Details  Name: Tanya [name] MRN: [id number] Date of Birth: 08/18/1961 Referring Provider: Kathryne HitchBlackman, Christopher Y, MD  Encounter Date: 06/30/2017      PT End of Session - 06/30/17 0718    Visit Number 2   Number of Visits 9   Date for PT Re-Evaluation 07/22/17   Authorization Type UHC- 20 visit limit   PT Start Time 0715   PT Stop Time 0800   PT Time Calculation (min) 45 min      Past Medical History:  Diagnosis Date  . Allergy   . Diabetes mellitus   . Goiter   . Hyperlipidemia   . Hypertension   . Morbid obesity (HCC) 12/24/2013  . OSA (obstructive sleep apnea)   . Palpitations 08/01/2016  . Sarcoid   . Sarcoidosis   . Vitamin D deficiency     Past Surgical History:  Procedure Laterality Date  . ABDOMINAL HYSTERECTOMY    . achiles tendon     right  . EYE SURGERY    . TONSILLECTOMY      There were no vitals filed for this visit.      Subjective Assessment - 06/30/17 0717    Subjective The thigh part is not that bad, its when I sit a long time.    Currently in Pain? No/denies                         [facility] Adult PT Treatment/Exercise - 06/30/17 0001      Lumbar Exercises: Supine   Clam 10 reps  2 sets    Clam Limitations with posterior pelvic tilit      Knee/Hip Exercises: Stretches   Hip Flexor Stretch Right;Left;2 reps;60 seconds   Hip Flexor Stretch Limitations also standing lunge 2 x 20 sec each     Knee/Hip Exercises: Standing   Other Standing Knee Exercises Glute squeeze 5 sec x 10 with cues for breathing.      Knee/Hip Exercises: Seated   Other Seated Knee/Hip Exercises setaed pelvic tilit, seated transverse abdominal draw ins while breathing.    Sit to Sand 1 set;10 reps  transverse abdominal engagement      Knee/Hip Exercises: Supine   Bridges Limitations x 10 5 sec holds    Other Supine Knee/Hip Exercises pelvic tilt 10 x 2 with cues for breathing      Knee/Hip Exercises: Sidelying   Clams x30 each                     PT Long Term Goals - 06/20/17 1725      PT LONG TERM GOAL #1   Title Hip abduction to 4+/5 bilat for support to lumbopelvic region   Baseline see flowsheet   Time 4   Period Weeks   Status New   Target Date 07/22/17     PT LONG TERM GOAL #2   Title Pt will be able to stand and walk away from her chair without increase in pain   Baseline pain upon standing at eval   Time 4   Period Weeks   Status New   Target Date 07/22/17     PT LONG TERM GOAL #3   Title Pt will be able to walk for exercise without increased throbbing in R thigh   Baseline it's not  constant at eval but throbs when walking   Time 4   Period Weeks   Status New   Target Date 07/22/17     PT LONG TERM GOAL #4   Title pt will be independent with HEP for long term care   Baseline will progress as appropriate   Time 4   Period Weeks   Status New   Target Date 07/22/17               Plan - 06/30/17 0730    Clinical Impression Statement Pt reports she is wearing the heel lift every day. She is used to the way it feels now. She has performed her HEP every day. We reviewed initial HEP and she is independent. Progressed Core and added to HEP. ALso gave standing option for hip flexor stretch at work. Instructed pt is seated pelvic tilits and sit to stands to try at work as well. No pain at end of session.    PT Next Visit Plan how did lift in shoe feel? hip abductor strengthening, core strengthening   PT Home Exercise Plan glut squeeze, abdominal engagement, clam, thomas test stretch; PPT with clam, standing hip flexor stretch    Consulted and Agree with Plan of Care Patient      Patient will benefit from skilled therapeutic intervention in order to improve the following deficits and impairments:  Abnormal  gait, Increased muscle spasms, Decreased activity tolerance, Pain, Improper body mechanics, Impaired flexibility, Decreased strength, Postural dysfunction  Visit Diagnosis: Pain in right leg  Muscle weakness (generalized)     Problem List Patient Active Problem List   Diagnosis Date Noted  . Acute right-sided low back pain with right-sided sciatica 06/01/2017  . Tortuous aorta (HCC) 05/20/2017  . Meralgia paresthetica of right side 03/01/2017  . Palpitations 08/01/2016  . Obesity, Class III, BMI 40-49.9 (morbid obesity) (HCC) 12/24/2013  . Hypertension   . Hyperlipidemia   . Diabetes mellitus without complication (HCC)   . Allergy   . OSA (obstructive sleep apnea)   . Goiter   . Sarcoid   . Vitamin D deficiency     [name], [place] 06/30/2017, 8:18 AM  [facility] [address] [place], [place], [address] Phone: [phone]   Fax:  [phone]  Name: Tanya [name] MRN: [id number] Date of Birth: [birth year]

## 2017-07-05 ENCOUNTER — Ambulatory Visit (INDEPENDENT_AMBULATORY_CARE_PROVIDER_SITE_OTHER): Payer: 59 | Admitting: Orthopaedic Surgery

## 2017-07-05 ENCOUNTER — Ambulatory Visit (INDEPENDENT_AMBULATORY_CARE_PROVIDER_SITE_OTHER): Payer: 59 | Admitting: Internal Medicine

## 2017-07-05 ENCOUNTER — Encounter: Payer: Self-pay | Admitting: Internal Medicine

## 2017-07-05 VITALS — BP 124/76 | HR 64 | Temp 97.6°F | Resp 16 | Ht 65.0 in | Wt 236.6 lb

## 2017-07-05 DIAGNOSIS — J0140 Acute pansinusitis, unspecified: Secondary | ICD-10-CM

## 2017-07-05 DIAGNOSIS — J0410 Acute tracheitis without obstruction: Secondary | ICD-10-CM | POA: Diagnosis not present

## 2017-07-05 MED ORDER — AZITHROMYCIN 250 MG PO TABS: ORAL_TABLET | ORAL | 1 refills | Status: DC

## 2017-07-05 MED ORDER — PROMETHAZINE-DM 6.25-15 MG/5ML PO SYRP: ORAL_SOLUTION | ORAL | 1 refills | Status: DC

## 2017-07-05 MED ORDER — PREDNISONE 20 MG PO TABS: ORAL_TABLET | ORAL | 0 refills | Status: DC

## 2017-07-05 NOTE — Progress Notes (Signed)
[facility]  Lucky Cowboy, M.D.      Dyanne Carrel. Steffanie Dunn, P.A.-C [facility] [address]  [place], [place]. [address] Telephone [phone] Telefax [phone]  Subjective:    Patient ID: Tanya [name], female    DOB: 1961/06/22, 56 y.o.   MRN: [id number]  HPI  This very nice 56 yo MBF w/ HTN, HLD, T2DM presents with a 1 week prodrome of persistent and gradually worsening Sinus HA and congestion an a dry non productive cough. Denies fevers, chills, sweats, dyspnea or rash.   Medication Sig  . aspirin EC 81 MG tablet Take 81 mg by mouth daily.    . B Complex-C (SUPER B COMPLEX PO) Take by mouth daily.  . bisoprolol-hydrochlorothiazide (ZIAC) 10-6.25 MG tablet TAKE 1 TABLET BY MOUTH EVERY DAY  . Cholecalciferol (VITAMIN D PO) Take 5,000 Int'l Units by mouth 3 (three) times a week.   . cloNIDine (CATAPRES) 0.2 MG tablet TAKE 1 TABLET TWICE A DAY  . diclofenac sodium (VOLTAREN) 1 % GEL Apply 2 g topically 4 (four) times daily.  . furosemide (LASIX) 40 MG tablet TAKE 1 TABLET BY MOUTH TWICE A DAY FOR BLOOD PRESSURE & FLUID RETENTION  . gabapentin (NEURONTIN) 100 MG capsule Take 1 capsule 2 to 3 times a day.  . hydrALAZINE (APRESOLINE) 25 MG tablet Take 1 tablet (25 mg total) by mouth 3 (three) times daily.  Marland Kitchen losartan (COZAAR) 100 MG tablet TAKE 1 TABLET (100 MG TOTAL) BY MOUTH DAILY.  . Magnesium 250 MG TABS Take 250 mg by mouth daily.  . metFORMIN (GLUCOPHAGE-XR) 500 MG 24 hr tablet TAKE 2 TABLETS (1,000 MG TOTAL) BY MOUTH 2 (TWO) TIMES DAILY.  . Multiple Vitamins-Minerals (MULTIVITAMIN PO) Take by mouth daily.  . Vitamin D, Ergocalciferol, (DRISDOL) 50000 units CAPS capsule TAKE ONE CAPSULE BY MOUTH TWICE A WEEK   Allergies  Allergen Reactions  . Amlodipine Swelling  . Naproxen Hives  . Meloxicam & Diet Manage Prod Rash   Past Medical History:  Diagnosis Date  . Allergy   . Diabetes mellitus   . Goiter   .  Hyperlipidemia   . Hypertension   . Morbid obesity (HCC) 12/24/2013  . OSA (obstructive sleep apnea)   . Palpitations 08/01/2016  . Sarcoid   . Sarcoidosis   . Vitamin D deficiency    Review of Systems  10 point systems review negative except as above.    Objective:   Physical Exam  BP 124/76   Pulse 64   Temp 97.6 F (36.4 C)   Resp 16   Ht 5\' 5"  (1.651 m)   Wt 236 lb 9.6 oz (107.3 kg)   BMI 39.37 kg/m   Dry Brassy cough. Sl hoarse. No Stridor. No Rash.  HEENT - Eac's patent. TM's Nl. EOM's full. PERRLA. (+) tender frontal > maxillary sinus areas. NasoOroPharynx clear. Neck - supple. Nl Thyroid. Carotids 2+ & No bruits, nodes, JVD Chest - Few scattered dry rales and no rhonchi, wheezes. Cor - Nl HS. RRR w/o sig MGR. PP 1(+).  MS- FROM w/o deformities. Muscle power, tone and bulk Nl. Gait Nl. Neuro - No obvious Cr N abnormalities. Sensory, motor and Cerebellar functions appear Nl w/o focal abnormalities.    Assessment & Plan:   1. Acute non-recurrent pansinusitis   2. Tracheitis  - predniSONE (DELTASONE) 20 MG tablet; 1 tab 3 x day for 3 days, then 1 tab 2 x day for 3 days,  then 1 tab 1 x day for 5 days  Dispense: 20 tablet; Refill: 0 - with hyperglycemic precautions  - azithromycin (ZITHROMAX) 250 MG tablet; Take 2 tablets (500 mg) on  Day 1,  followed by 1 tablet (250 mg) once daily on Days 2 through 5.  Dispense: 6 each; Refill: 1  - promethazine-dextromethorphan (PROMETHAZINE-DM) 6.25-15 MG/5ML syrup; Take 1 to 2 tsp enery 4 hours if needed for cough  Dispense: 360 mL; Refill: 1  Discussed meds & SE's. Discussed prudent diet & exercise

## 2017-07-05 NOTE — Patient Instructions (Signed)

## 2017-07-06 ENCOUNTER — Ambulatory Visit: Payer: 59 | Admitting: Physical Therapy

## 2017-07-08 ENCOUNTER — Ambulatory Visit: Payer: 59 | Attending: Internal Medicine | Admitting: Physical Therapy

## 2017-07-08 DIAGNOSIS — M6281 Muscle weakness (generalized): Secondary | ICD-10-CM | POA: Diagnosis not present

## 2017-07-08 DIAGNOSIS — M79604 Pain in right leg: Secondary | ICD-10-CM | POA: Diagnosis not present

## 2017-07-08 NOTE — Therapy (Signed)
[facility] [address] [place], [place], [address] Phone: [phone]   Fax:  [phone]  Physical Therapy Treatment  Patient Details  Name: Tanya SaltsManya R Coleman MRN: [id number] Date of Birth: 02/10/1961 Referring Provider: Kathryne HitchBlackman, Christopher Y, MD  Encounter Date: 07/08/2017      PT End of Session - 07/08/17 0752    Visit Number 3   Number of Visits 9   Date for PT Re-Evaluation 07/22/17   Authorization Type UHC- 20 visit limit   PT Start Time 0713   PT Stop Time 0755   PT Time Calculation (min) 42 min      Past Medical History:  Diagnosis Date  . Allergy   . Diabetes mellitus   . Goiter   . Hyperlipidemia   . Hypertension   . Morbid obesity (HCC) 12/24/2013  . OSA (obstructive sleep apnea)   . Palpitations 08/01/2016  . Sarcoid   . Sarcoidosis   . Vitamin D deficiency     Past Surgical History:  Procedure Laterality Date  . ABDOMINAL HYSTERECTOMY    . achiles tendon     right  . EYE SURGERY    . TONSILLECTOMY      There were no vitals filed for this visit.      Subjective Assessment - 07/08/17 1044    Subjective I am on prednisone for my sinuses and I am not having pain and stiffness.    Currently in Pain? No/denies                         [facility] Adult PT Treatment/Exercise - 07/08/17 0001      Lumbar Exercises: Supine   Clam 10 reps  2 sets    Clam Limitations with posterior pelvic tilit    Heel Slides 10 reps   Heel Slides Limitations with PPT    Bent Knee Raise 10 reps   Bent Knee Raise Limitations with PPT     Knee/Hip Exercises: Stretches   Hip Flexor Stretch Right;Left;2 reps;60 seconds   Hip Flexor Stretch Limitations also standing lunge 2 x 20 sec each     Knee/Hip Exercises: Seated   Other Seated Knee/Hip Exercises setaed pelvic tilit, seated transverse abdominal draw ins while breathing.    Sit to Sand 1 set;10 reps  transverse abdominal engagement      Knee/Hip  Exercises: Supine   Bridges Limitations x 10 5 sec holds    Bridges with Clamshell 10 reps   Single Leg Bridge 10 reps   Other Supine Knee/Hip Exercises pelvic tilt 10 x 2 with cues for breathing      Knee/Hip Exercises: Sidelying   Clams red band  x 30                     PT Long Term Goals - 06/20/17 1725      PT LONG TERM GOAL #1   Title Hip abduction to 4+/5 bilat for support to lumbopelvic region   Baseline see flowsheet   Time 4   Period Weeks   Status New   Target Date 07/22/17     PT LONG TERM GOAL #2   Title Pt will be able to stand and walk away from her chair without increase in pain   Baseline pain upon standing at eval   Time 4   Period Weeks   Status New   Target Date 07/22/17     PT LONG TERM  GOAL #3   Title Pt will be able to walk for exercise without increased throbbing in R thigh   Baseline it's not constant at eval but throbs when walking   Time 4   Period Weeks   Status New   Target Date 07/22/17     PT LONG TERM GOAL #4   Title pt will be independent with HEP for long term care   Baseline will progress as appropriate   Time 4   Period Weeks   Status New   Target Date 07/22/17               Plan - 07/08/17 1045    Clinical Impression Statement Pt reports decreased pain since beginning prednisone. She still has more than a week left of pills to take. We continued hip and core strength with good tolerance. Reviewed stretches for work. Pt reports she no longer has stiffness with sit to stand after sitting.    PT Next Visit Plan  hip abductor strengthening, core strengthening, update HEP   PT Home Exercise Plan glut squeeze, abdominal engagement, clam-added red band , thomas test stretch; PPT with clam-added red band , standing hip flexor stretch    Consulted and Agree with Plan of Care Patient      Patient will benefit from skilled therapeutic intervention in order to improve the following deficits and impairments:  Abnormal  gait, Increased muscle spasms, Decreased activity tolerance, Pain, Improper body mechanics, Impaired flexibility, Decreased strength, Postural dysfunction  Visit Diagnosis: Pain in right leg  Muscle weakness (generalized)     Problem List Patient Active Problem List   Diagnosis Date Noted  . Acute right-sided low back pain with right-sided sciatica 06/01/2017  . Tortuous aorta (HCC) 05/20/2017  . Meralgia paresthetica of right side 03/01/2017  . Palpitations 08/01/2016  . Obesity, Class III, BMI 40-49.9 (morbid obesity) (HCC) 12/24/2013  . Hypertension   . Hyperlipidemia   . Diabetes mellitus without complication (HCC)   . Allergy   . OSA (obstructive sleep apnea)   . Goiter   . Sarcoid   . Vitamin D deficiency     Tanya [name], [place] 07/08/2017, 12:41 PM  [facility] [address] [place], [place], [address] Phone: [phone]   Fax:  [phone]  Name: Tanya [name] MRN: [id number] Date of Birth: 19-Apr-1961

## 2017-07-11 ENCOUNTER — Ambulatory Visit: Payer: 59 | Admitting: Physical Therapy

## 2017-07-13 ENCOUNTER — Ambulatory Visit: Payer: 59 | Attending: Internal Medicine | Admitting: Physical Therapy

## 2017-07-13 DIAGNOSIS — M79604 Pain in right leg: Secondary | ICD-10-CM | POA: Diagnosis not present

## 2017-07-13 DIAGNOSIS — M6281 Muscle weakness (generalized): Secondary | ICD-10-CM

## 2017-07-13 NOTE — Therapy (Signed)
[place], [place], [address] Phone: [phone]   Fax:  [phone]  Physical Therapy Treatment  Patient Details  Name: Tanya [name] MRN: [id number] Date of Birth: 05/06/1961 Referring Provider: Mcarthur Rossetti, MD  Encounter Date: 07/13/2017      PT End of Session - 07/13/17 0718    Visit Number 4   Number of Visits 9   Date for PT Re-Evaluation 07/22/17   Authorization Type U[employer]- 20 visit limit   PT Start Time 0715   PT Stop Time 0755   PT Time Calculation (min) 40 min      Past Medical History:  Diagnosis Date  . Allergy   . Diabetes mellitus   . Goiter   . Hyperlipidemia   . Hypertension   . Morbid obesity ([place]) 12/24/2013  . OSA (obstructive sleep apnea)   . Palpitations 08/01/2016  . Sarcoid   . Sarcoidosis   . Vitamin D deficiency     Past Surgical History:  Procedure Laterality Date  . ABDOMINAL HYSTERECTOMY    . achiles tendon     right  . EYE SURGERY    . TONSILLECTOMY      There were no vitals filed for this visit.      Subjective Assessment - 07/13/17 0718    Subjective Still no pain. On prednisone until Saturday.    Currently in Pain? No/denies            [facility] PT Assessment - 07/13/17 0001      Strength   Right Hip ABduction 4/5                     [facility] Adult PT Treatment/Exercise - 07/13/17 0001      Lumbar Exercises: Supine   Clam 10 reps  2 sets , green band    Clam Limitations with posterior pelvic tilit      Knee/Hip Exercises: Stretches   Hip Flexor Stretch Right;Left;2 reps;60 seconds   Hip Flexor Stretch Limitations also standing lunge 2 x 20 sec each   Piriformis Stretch 2 reps;20 seconds     Knee/Hip Exercises: Aerobic   Nustep L5 x 6 minutes      Knee/Hip Exercises: Standing   Other Standing Knee Exercises pelvic tilt/ transverse abdominal draw in with walking      Knee/Hip Exercises: Seated   Sit to Sand 1  set;15 reps  transverse abdominal engagement      Knee/Hip Exercises: Supine   Bridges Limitations x 10 5 sec holds    Bridges with Clamshell 5 reps  3 sets    Single Leg Bridge 10 reps   Straight Leg Raises 10 reps   Straight Leg Raises Limitations 2 sets with abdominal draw in      Knee/Hip Exercises: Sidelying   Hip ABduction 10 reps   Clams green band x 15 each                 PT Education - 07/13/17 0747    Education provided Yes   Education Details HEP   Person(s) Educated Patient   Methods Explanation;Handout   Comprehension Verbalized understanding             PT Long Term Goals - 07/13/17 0719      PT LONG TERM GOAL #1   Title Hip abduction to 4+/5 bilat for support to lumbopelvic region   Time 4   Period Weeks   Status On-going  PT LONG TERM GOAL #2   Title Pt will be able to stand and walk away from her chair without increase in pain   Baseline right now yes, because on prednisone    Time 4   Period Weeks   Status Partially Met     PT LONG TERM GOAL #3   Title Pt will be able to walk for exercise without increased throbbing in R thigh   Baseline takes two 15 minute walks when on break at work, no pain on prednisone   Time 4   Period Weeks   Status Partially Met     PT LONG TERM GOAL #4   Title pt will be independent with HEP for long term care   Baseline will progress as appropriate   Time 4   Period Weeks   Status On-going               Plan - 07/13/17 0747    Clinical Impression Statement Pt continues with no pain maybe due to prednisone medication she recieved for a sinus infection. We continued core and hip strengthening/stretching without pain. SHe is walking 15 minutes during her breaks at work without pain. She is rising from chair and walking away without pain. LTGS partially met for now. Progressed HEP.   PT Next Visit Plan  hip abductor strengthening, core strengthening, update HEP   PT Home Exercise Plan glut  squeeze, abdominal engagement, clam-added red band , thomas test stretch; PPT with clam-added red band , standing hip flexor stretch, bridge, side hip abduction, SLR with abdominal draw in   Consulted and Agree with Plan of Care Patient      Patient will benefit from skilled therapeutic intervention in order to improve the following deficits and impairments:  Abnormal gait, Increased muscle spasms, Decreased activity tolerance, Pain, Improper body mechanics, Impaired flexibility, Decreased strength, Postural dysfunction  Visit Diagnosis: Pain in right leg  Muscle weakness (generalized)     Problem List Patient Active Problem List   Diagnosis Date Noted  . Acute right-sided low back pain with right-sided sciatica 06/01/2017  . Tortuous aorta ([place]) 05/20/2017  . Meralgia paresthetica of right side 03/01/2017  . Palpitations 08/01/2016  . Obesity, Class III, BMI 40-49.9 (morbid obesity) ([place]) 12/24/2013  . Hypertension   . Hyperlipidemia   . Diabetes mellitus without complication ([place])   . Allergy   . OSA (obstructive sleep apnea)   . Goiter   . Sarcoid   . Vitamin D deficiency     Dorene Ar, [place]  07/13/2017, 7:54 AM  [facility] [address] [place], [place], [address] Phone: [phone]   Fax:  [phone]  Name: Tanya [name] MRN: [id number] Date of Birth: 04/27/1961

## 2017-07-13 NOTE — Patient Instructions (Addendum)
Abduction: Side Leg Lift (Eccentric) - Side-Lying    Lie on side. Lift top leg slightly higher than shoulder level. Keep top leg straight with body, toes pointing forward. Slowly lower for 3-5 seconds. _10__ reps per set, __2_ sets per day, _7 days per week.  Bridge    Lie back, legs bent. Inhale, pressing hips up. Keeping ribs in, lengthen lower back. Exhale, rolling down along spine from top. Repeat _10-20___ times. Do ___2_ sessions per day.   Straight Leg Raise    Tighten stomach and slowly raise locked right leg. Repeat __10__ times per set. Do 2____ sets per session. Do __2__ sessions per day.

## 2017-07-15 ENCOUNTER — Ambulatory Visit: Payer: 59 | Admitting: Physical Therapy

## 2017-07-18 ENCOUNTER — Ambulatory Visit: Payer: 59 | Admitting: Physical Therapy

## 2017-07-18 ENCOUNTER — Encounter: Payer: 59 | Admitting: Physical Therapy

## 2017-07-18 DIAGNOSIS — M6281 Muscle weakness (generalized): Secondary | ICD-10-CM

## 2017-07-18 DIAGNOSIS — M79604 Pain in right leg: Secondary | ICD-10-CM | POA: Diagnosis not present

## 2017-07-18 NOTE — Therapy (Signed)
[place], [place], [address] Phone: [phone]   Fax:  [phone]  Physical Therapy Treatment  Patient Details  Name: Tanya [name] MRN: [id number] Date of Birth: July 23, 1961 Referring Provider: Mcarthur Rossetti, MD  Encounter Date: 07/18/2017      PT End of Session - 07/18/17 7425    Visit Number 5   Number of Visits 9   Date for PT Re-Evaluation 07/22/17   PT Start Time 9563   PT Stop Time 0755   PT Time Calculation (min) 38 min      Past Medical History:  Diagnosis Date  . Allergy   . Diabetes mellitus   . Goiter   . Hyperlipidemia   . Hypertension   . Morbid obesity ([place]) 12/24/2013  . OSA (obstructive sleep apnea)   . Palpitations 08/01/2016  . Sarcoid   . Sarcoidosis   . Vitamin D deficiency     Past Surgical History:  Procedure Laterality Date  . ABDOMINAL HYSTERECTOMY    . achiles tendon     right  . [name] SURGERY    . TONSILLECTOMY      There were no vitals filed for this visit.      Subjective Assessment - 07/18/17 0721    Subjective So far still no pain.    Currently in Pain? No/denies                         [facility] Adult PT Treatment/Exercise - 07/18/17 0001      Lumbar Exercises: Supine   Clam 10 reps  2 sets , green band    Clam Limitations with posterior pelvic tilit    Heel Slides 10 reps   Heel Slides Limitations with PPT    Bent Knee Raise 10 reps   Bent Knee Raise Limitations with PPT     Knee/Hip Exercises: Stretches   Hip Flexor Stretch Limitations also standing lunge 2 x 20 sec each   Piriformis Stretch 2 reps;20 seconds     Knee/Hip Exercises: Aerobic   Nustep L5 x 8 minutes      Knee/Hip Exercises: Standing   Wall Squat 10 reps   Wall Squat Limitations with transverse abdominal draw  in      Knee/Hip Exercises: Seated   Other Seated Knee/Hip Exercises seated pelvic tilit, seated transverse abdominal draw ins while  breathing.    Sit to Sand 1 set;15 reps  transverse abdominal engagement      Knee/Hip Exercises: Supine   Bridges Limitations x 10 5 sec holds    Bridges with Clamshell 5 reps  3 sets    Single Leg Bridge 10 reps   Straight Leg Raises 10 reps   Straight Leg Raises Limitations 2 sets with abdominal draw in    Other Supine Knee/Hip Exercises pelvic tilt 10 x 2 with cues for breathing      Knee/Hip Exercises: Sidelying   Hip ABduction 20 reps                     PT Long Term Goals - 07/13/17 0719      PT LONG TERM GOAL #1   Title Hip abduction to 4+/5 bilat for support to lumbopelvic region   Time 4   Period Weeks   Status On-going     PT LONG TERM GOAL #2   Title Pt will be able to stand and walk away from her chair  without increase in pain   Baseline right now yes, because on prednisone    Time 4   Period Weeks   Status Partially Met     PT LONG TERM GOAL #3   Title Pt will be able to walk for exercise without increased throbbing in R thigh   Baseline takes two 15 minute walks when on break at work, no pain on prednisone   Time 4   Period Weeks   Status Partially Met     PT LONG TERM GOAL #4   Title pt will be independent with HEP for long term care   Baseline will progress as appropriate   Time 4   Period Weeks   Status On-going               Plan - 07/18/17 0383    Clinical Impression Statement No pain over the weekend. Independent with HEP. Right hip fatigues with therex. Likely DC next visit if still painfree.    PT Next Visit Plan  hip abductor strengthening, core strengthening, review HEP, DC or continue if pain returns. Check GOals, FOTO    PT Home Exercise Plan glut squeeze, abdominal engagement, clam-added red band , thomas test stretch; PPT with clam-added red band , standing hip flexor stretch, bridge, side hip abduction, SLR with abdominal draw in   Consulted and Agree with Plan of Care Patient      Patient will benefit from  skilled therapeutic intervention in order to improve the following deficits and impairments:  Abnormal gait, Increased muscle spasms, Decreased activity tolerance, Pain, Improper body mechanics, Impaired flexibility, Decreased strength, Postural dysfunction  Visit Diagnosis: Pain in right leg  Muscle weakness (generalized)     Problem List Patient Active Problem List   Diagnosis Date Noted  . Acute right-sided low back pain with right-sided sciatica 06/01/2017  . Tortuous aorta ([place]) 05/20/2017  . Meralgia paresthetica of right side 03/01/2017  . Palpitations 08/01/2016  . Obesity, Class III, BMI 40-49.9 (morbid obesity) ([place]) 12/24/2013  . Hypertension   . Hyperlipidemia   . Diabetes mellitus without complication ([place])   . Allergy   . OSA (obstructive sleep apnea)   . Goiter   . Sarcoid   . Vitamin D deficiency     Dorene Ar, [place] 07/18/2017, 8:28 AM  [facility] [address] [place], [place], [address] Phone: [phone]   Fax:  [phone]  Name: Tanya [name] MRN: [id number] Date of Birth: 1961-10-02

## 2017-07-20 ENCOUNTER — Ambulatory Visit: Payer: 59 | Admitting: Physical Therapy

## 2017-07-20 DIAGNOSIS — M6281 Muscle weakness (generalized): Secondary | ICD-10-CM

## 2017-07-20 DIAGNOSIS — M79604 Pain in right leg: Secondary | ICD-10-CM

## 2017-07-20 NOTE — Therapy (Addendum)
[place], [place], [address] Phone: [phone]   Fax:  [phone]  Physical Therapy Treatment/Discharge Summary  Patient Details  Name: Tanya [name] MRN: [id number] Date of Birth: May 04, 1961 Referring Provider: Mcarthur Rossetti, MD  Encounter Date: 07/20/2017      PT End of Session - 07/20/17 0728    Visit Number 6   Number of Visits 9   Date for PT Re-Evaluation 07/22/17   Authorization Type UHC- 20 visit limit   PT Start Time 0715   PT Stop Time 0755   PT Time Calculation (min) 40 min      Past Medical History:  Diagnosis Date  . Allergy   . Diabetes mellitus   . Goiter   . Hyperlipidemia   . Hypertension   . Morbid obesity ([place]) 12/24/2013  . OSA (obstructive sleep apnea)   . Palpitations 08/01/2016  . Sarcoid   . Sarcoidosis   . Vitamin D deficiency     Past Surgical History:  Procedure Laterality Date  . ABDOMINAL HYSTERECTOMY    . achiles tendon     right  . EYE SURGERY    . TONSILLECTOMY      There were no vitals filed for this visit.      Subjective Assessment - 07/20/17 0728    Subjective Still no pain. Doing great.    Currently in Pain? No/denies            [facility] PT Assessment - 07/20/17 0001      Observation/Other Assessments   Focus on Therapeutic Outcomes (FOTO)  6% limited      Strength   Right Hip ABduction 4/5   Left Hip ABduction 4/5                     [facility] Adult PT Treatment/Exercise - 07/20/17 0001      Lumbar Exercises: Supine   Clam 10 reps  2 sets , green band    Clam Limitations with posterior pelvic tilit    Bent Knee Raise 10 reps   Bent Knee Raise Limitations with PPT     Knee/Hip Exercises: Stretches   Hip Flexor Stretch Right;Left;2 reps;60 seconds   Hip Flexor Stretch Limitations also standing lunge 2 x 20 sec each     Knee/Hip Exercises: Aerobic   Nustep L5 x 7 minutes      Knee/Hip Exercises: Standing    Wall Squat 10 reps   Wall Squat Limitations with transverse abdominal draw  in      Knee/Hip Exercises: Seated   Sit to Sand 1 set;15 reps  transverse abdominal engagement      Knee/Hip Exercises: Supine   Bridges with Clamshell 5 reps  3 sets    Single Leg Bridge 10 reps   Straight Leg Raises 15 reps   Straight Leg Raises Limitations with abdominal draw in    Other Supine Knee/Hip Exercises pelvic tilt 10 x 2 with cues for breathing      Knee/Hip Exercises: Sidelying   Hip ABduction 20 reps   Clams green band x 20 each                      PT Long Term Goals - 07/20/17 0731      PT LONG TERM GOAL #1   Title Hip abduction to 4+/5 bilat for support to lumbopelvic region   Baseline 4/5, improved from 3+/5   Time 4  Period Weeks   Status Partially Met     PT LONG TERM GOAL #2   Title Pt will be able to stand and walk away from her chair without increase in pain   Time 4   Period Weeks   Status Achieved     PT LONG TERM GOAL #3   Title Pt will be able to walk for exercise without increased throbbing in R thigh   Baseline takes two 15 minute walks per day  when on break at work, no pain    Time 4   Period Weeks   Status Achieved     PT LONG TERM GOAL #4   Title pt will be independent with HEP for long term care   Time 4   Period Weeks   Status Achieved               Plan - 07/20/17 0741    Clinical Impression Statement Pt reports no pain. She is walking on her 2 15 minute breaks at work for exercise. She is getting up an walking away from chair  without pain throughout the day., She is independent with HEP. Her hip abduction strength improved from 3+/5 to 4/5 and she plans to continue her HEP. All goals met except LTG# which is partially met.    PT Next Visit Plan discharge today   PT Home Exercise Plan glut squeeze, abdominal engagement, clam-added red band , thomas test stretch; PPT with clam-added red band , standing hip flexor stretch,  bridge, side hip abduction, SLR with abdominal draw in   Consulted and Agree with Plan of Care Patient      Patient will benefit from skilled therapeutic intervention in order to improve the following deficits and impairments:  Abnormal gait, Increased muscle spasms, Decreased activity tolerance, Pain, Improper body mechanics, Impaired flexibility, Decreased strength, Postural dysfunction  Visit Diagnosis: Muscle weakness (generalized)  Pain in right leg     Problem List Patient Active Problem List   Diagnosis Date Noted  . Acute right-sided low back pain with right-sided sciatica 06/01/2017  . Tortuous aorta ([place]) 05/20/2017  . Meralgia paresthetica of right side 03/01/2017  . Palpitations 08/01/2016  . Obesity, Class III, BMI 40-49.9 (morbid obesity) ([place]) 12/24/2013  . Hypertension   . Hyperlipidemia   . Diabetes mellitus without complication ([place])   . Allergy   . OSA (obstructive sleep apnea)   . Goiter   . Sarcoid   . Vitamin D deficiency     Dorene Ar , [place] 07/20/2017, 8:06 AM  [facility] [address] [place], [place], [address] Phone: [phone]   Fax:  [phone]  Name: Tanya [name] MRN: [id number] Date of Birth: [birth year]   PHYSICAL THERAPY DISCHARGE SUMMARY  Visits from Start of Care: 6  Current functional level related to goals / functional outcomes: See above   Remaining deficits: See above   Education / Equipment: Anatomy of condition, POC, HEP, exercise form/rationale  Plan: Patient agrees to discharge.  Patient goals were met. Patient is being discharged due to meeting the stated rehab goals.  ?????      Birdell Frasier C. Hightower PT, DPT 07/20/17 10:21 AM

## 2017-07-24 ENCOUNTER — Other Ambulatory Visit: Payer: Self-pay | Admitting: Cardiovascular Disease

## 2017-07-25 ENCOUNTER — Ambulatory Visit: Payer: 59 | Admitting: Physical Therapy

## 2017-07-25 ENCOUNTER — Other Ambulatory Visit: Payer: Self-pay

## 2017-07-25 MED ORDER — VITAMIN D (ERGOCALCIFEROL) 1.25 MG (50000 UNIT) PO CAPS: ORAL_CAPSULE | ORAL | 2 refills | Status: DC

## 2017-07-25 NOTE — Telephone Encounter (Signed)
Refill Request.  

## 2017-07-27 ENCOUNTER — Ambulatory Visit: Payer: 59 | Admitting: Physical Therapy

## 2017-08-15 ENCOUNTER — Other Ambulatory Visit: Payer: Self-pay

## 2017-08-15 DIAGNOSIS — G5711 Meralgia paresthetica, right lower limb: Secondary | ICD-10-CM

## 2017-08-15 DIAGNOSIS — M545 Low back pain, unspecified: Secondary | ICD-10-CM

## 2017-08-15 DIAGNOSIS — G8929 Other chronic pain: Secondary | ICD-10-CM

## 2017-08-15 MED ORDER — GABAPENTIN 100 MG PO CAPS: ORAL_CAPSULE | ORAL | 0 refills | Status: DC

## 2017-08-16 ENCOUNTER — Other Ambulatory Visit: Payer: Self-pay | Admitting: Internal Medicine

## 2017-08-23 DIAGNOSIS — G4733 Obstructive sleep apnea (adult) (pediatric): Secondary | ICD-10-CM | POA: Diagnosis not present

## 2017-09-13 ENCOUNTER — Ambulatory Visit: Payer: Self-pay | Admitting: Adult Health

## 2017-09-21 ENCOUNTER — Other Ambulatory Visit: Payer: Self-pay | Admitting: *Deleted

## 2017-09-21 MED ORDER — LOSARTAN POTASSIUM 100 MG PO TABS: ORAL_TABLET | ORAL | 1 refills | Status: DC

## 2017-09-21 MED ORDER — FUROSEMIDE 40 MG PO TABS: ORAL_TABLET | ORAL | 1 refills | Status: DC

## 2017-09-24 ENCOUNTER — Other Ambulatory Visit: Payer: Self-pay | Admitting: Cardiovascular Disease

## 2017-09-26 ENCOUNTER — Other Ambulatory Visit: Payer: Self-pay

## 2017-09-26 MED ORDER — LOSARTAN POTASSIUM 100 MG PO TABS: ORAL_TABLET | ORAL | 1 refills | Status: DC

## 2017-09-26 NOTE — Telephone Encounter (Signed)
Please review for refill, Thanks !  

## 2017-10-17 NOTE — Progress Notes (Signed)
FOLLOW UP  Assessment and Plan:   Hypertension Well controlled with current medications Monitor blood pressure at home; patient to call if consistently greater than 130/80 Continue DASH diet.   Reminder to go to the ER if any CP, SOB, nausea, dizziness, severe HA, changes vision/speech, left arm numbness and tingling and jaw pain.  Cholesterol Well managed by lifestyle without medication Continue low cholesterol diet and exercise.  Check lipid panel.   Diabetes without complications Continue medication: metformin Continue diet and exercise.  Perform daily foot/skin check, notify office of any concerning changes.  Check A1C  Morbid Obesity with co morbidities Long discussion about weight loss, diet, and exercise Recommended diet heavy in fruits and veggies and low in animal meats, cheeses, and dairy products, appropriate calorie intake Discussed ideal weight for height -  Patient will - start regular exercise (3 times a week), 1/2 size portions on weekends, restart weight watchers Will follow up in 3 months  Vitamin D Def/ osteoporosis prevention Continue supplementation Check Vit D level  Carpal tunnel -wear a brace at night 6-8 weeks, RICE, NSAIDS, if not better will refer to ortho Obtain ergonomic keyboard for work  Continue diet and meds as discussed. Further disposition pending results of labs. Discussed med's effects and SE's.   Over 30 minutes of exam, counseling, chart review, and critical decision making was performed.   Future Appointments  Date Time Provider Department Center  05/25/2018  3:00 PM Quentin Mullingollier, Amanda, PA-C [id number] None    ----------------------------------------------------------------------------------------------------------------------  HPI 56 y.o. female  presents for 3 month follow up on hypertension, cholesterol, T2DM, morbid obesity and vitamin D deficiency. Back back with R sciatica/ meralgia parasthetica; she was prescribed neurontin  and has been doing back exercises and reports symptoms are significantly improved. 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 Not currently using an ergonomic keyboard.   BMI is Body mass index is 38.94 kg/m., she has been working on diet during the week but reports she does not do well  and exercise. Wt Readings from Last 3 Encounters:  10/18/17 234 lb (106.1 kg)  07/05/17 236 lb 9.6 oz (107.3 kg)  05/23/17 230 lb (104.3 kg)   Her blood pressure has been controlled at home, today their BP is BP: 126/76  She does not currently work out.  She denies chest pain, shortness of breath, dizziness.   She is not on cholesterol medication and denies myalgias. Her cholesterol is at goal. The cholesterol last visit was:   Lab Results  Component Value Date   CHOL 182 05/23/2017   HDL 97 05/23/2017   LDLCALC 60 05/23/2017   TRIG 126 05/23/2017   CHOLHDL 1.9 05/23/2017    She has not been working on diet and exercise for T2 diabetes, and denies increased appetite, nausea, paresthesia of the feet, polydipsia, polyuria, visual disturbances and vomiting. Last A1C in the office was:  Lab Results  Component Value Date   HGBA1C 6.7 (H) 05/23/2017   She has non-neoplastic goiter that we are monitoring without changes in TSH:     Lab Results  Component Value Date   TSH 0.63 05/23/2017   Patient is on Vitamin D supplement and at goal at the last check:  Lab Results  Component Value Date   VD25OH 98 09/02/2016      Current Medications:  Current Outpatient Medications on File Prior to Visit  Medication Sig  . aspirin EC 81 MG tablet Take 81 mg by mouth daily.    . B Complex-C (SUPER B COMPLEX PO) Take by mouth daily.  . bisoprolol-hydrochlorothiazide (ZIAC) 10-6.25 MG tablet TAKE 1 TABLET BY MOUTH EVERY DAY  . Cholecalciferol (VITAMIN D PO) Take 5,000 Int'l Units by mouth 3 (three) times a week.    . cloNIDine (CATAPRES) 0.2 MG tablet TAKE 1 TABLET TWICE A DAY  . diclofenac sodium (VOLTAREN) 1 % GEL Apply 2 g topically 4 (four) times daily.  . furosemide (LASIX) 40 MG tablet TAKE 1 TABLET BY MOUTH TWICE A DAY FOR BLOOD PRESSURE & FLUID RETENTION  . gabapentin (NEURONTIN) 100 MG capsule Take 1 capsule 2 to 3 times a day.  . hydrALAZINE (APRESOLINE) 25 MG tablet TAKE 1 TABLET BY MOUTH THREE TIMES A DAY  . losartan (COZAAR) 100 MG tablet TAKE 1 TABLET (100 MG TOTAL) BY MOUTH DAILY.  . Magnesium 250 MG TABS Take 250 mg by mouth daily.  . metFORMIN (GLUCOPHAGE-XR) 500 MG 24 hr tablet TAKE 2 TABLETS (1,000 MG TOTAL) BY MOUTH 2 (TWO) TIMES DAILY.  . Multiple Vitamins-Minerals (MULTIVITAMIN PO) Take by mouth daily.  . Vitamin D, Ergocalciferol, (DRISDOL) 50000 units CAPS capsule TAKE ONE CAPSULE BY MOUTH TWICE A WEEK  . azithromycin (ZITHROMAX) 250 MG tablet Take 2 tablets (500 mg) on  Day 1,  followed by 1 tablet (250 mg) once daily on Days 2 through 5. (Patient not taking: Reported on 10/18/2017)  . promethazine-dextromethorphan (PROMETHAZINE-DM) 6.25-15 MG/5ML syrup Take 1 to 2 tsp enery 4 hours if needed for cough (Patient not taking: Reported on 10/18/2017)   No current facility-administered medications on file prior to visit.      Allergies:  Allergies  Allergen Reactions  . Amlodipine Swelling  . Naproxen Hives  . Meloxicam & Diet Manage Prod Rash     Medical History:  Past Medical History:  Diagnosis Date  . Allergy   . Diabetes mellitus   . Goiter   . Hyperlipidemia   . Hypertension   . Morbid obesity ([facility]) 12/24/2013  . OSA (obstructive sleep apnea)   . Palpitations 08/01/2016  . Sarcoid   . Sarcoidosis   . Vitamin D deficiency    Family history- Reviewed and unchanged Social history- Reviewed and unchanged   Review of Systems:  Review of Systems  Constitutional: Negative for malaise/fatigue and weight loss.  HENT: Negative for hearing loss and tinnitus.    Eyes: Negative for blurred vision and double vision.  Respiratory: Negative for cough, shortness of breath and wheezing.   Cardiovascular: Negative for chest pain, palpitations, orthopnea, claudication and leg swelling.  Gastrointestinal: Negative for abdominal pain, blood in stool, constipation, diarrhea, heartburn, melena, nausea and vomiting.  Genitourinary: Negative.   Musculoskeletal: Positive for back pain (Intermittent R side - significantly improved). Negative for joint pain and myalgias.  Skin: Negative for rash.  Neurological: Positive for tingling (Left hand intermittently). Negative for dizziness, sensory change, weakness and headaches.  Endo/Heme/Allergies: Negative for polydipsia.  Psychiatric/Behavioral: Negative.   All other systems reviewed and are negative.     Physical Exam: BP 126/76   Pulse 74   Temp (!) 97.5 F (36.4 C)   Ht 5\' 5"  (1.651 m)   Wt 234 lb (106.1 kg)   SpO2 97%   BMI 38.94 kg/m  Wt Readings from Last 3 Encounters:  10/18/17 234 lb (106.1 kg)  07/05/17 236 lb 9.6 oz (107.3 kg)  05/23/17 230 lb (  104.3 kg)   General Appearance: Well nourished, in no apparent distress. Eyes: PERRLA, EOMs, conjunctiva no swelling or erythema Sinuses: No Frontal/maxillary tenderness ENT/Mouth: Ext aud canals clear, TMs without erythema, bulging. No erythema, swelling, or exudate on post pharynx.  Tonsils not swollen or erythematous. Hearing normal.  Neck: Supple, thyroid normal.  Respiratory: Respiratory effort normal, BS equal bilaterally without rales, rhonchi, wheezing or stridor.  Cardio: RRR with no MRGs. Brisk peripheral pulses without edema.  Abdomen: Soft, + BS.  Non tender, no guarding, rebound, hernias, masses. Lymphatics: Non tender without lymphadenopathy.  Musculoskeletal: Full ROM, 5/5 strength, Normal gait Skin: Warm, dry without rashes, lesions, ecchymosis.  Neuro: Cranial nerves intact. No cerebellar symptoms. + phalen's test on L Psych: Awake  and oriented X 3, normal affect, Insight and Judgment appropriate.    Dan Maker, NP 4:21 PM [facility]

## 2017-10-18 ENCOUNTER — Encounter: Payer: Self-pay | Admitting: Adult Health

## 2017-10-18 ENCOUNTER — Ambulatory Visit: Payer: 59 | Admitting: Adult Health

## 2017-10-18 VITALS — BP 126/76 | HR 74 | Temp 97.5°F | Ht 65.0 in | Wt 234.0 lb

## 2017-10-18 DIAGNOSIS — J0410 Acute tracheitis without obstruction: Secondary | ICD-10-CM | POA: Diagnosis not present

## 2017-10-18 DIAGNOSIS — Z79899 Other long term (current) drug therapy: Secondary | ICD-10-CM

## 2017-10-18 DIAGNOSIS — J0140 Acute pansinusitis, unspecified: Secondary | ICD-10-CM

## 2017-10-18 DIAGNOSIS — E559 Vitamin D deficiency, unspecified: Secondary | ICD-10-CM

## 2017-10-18 DIAGNOSIS — E119 Type 2 diabetes mellitus without complications: Secondary | ICD-10-CM | POA: Diagnosis not present

## 2017-10-18 DIAGNOSIS — G5602 Carpal tunnel syndrome, left upper limb: Secondary | ICD-10-CM | POA: Diagnosis not present

## 2017-10-18 DIAGNOSIS — I10 Essential (primary) hypertension: Secondary | ICD-10-CM

## 2017-10-18 DIAGNOSIS — E782 Mixed hyperlipidemia: Secondary | ICD-10-CM | POA: Diagnosis not present

## 2017-10-18 DIAGNOSIS — E049 Nontoxic goiter, unspecified: Secondary | ICD-10-CM | POA: Diagnosis not present

## 2017-10-18 MED ORDER — PREDNISONE 20 MG PO TABS: ORAL_TABLET | ORAL | 0 refills | Status: DC

## 2017-10-18 NOTE — Patient Instructions (Addendum)
Consider starting cinnamon supplement for sugars - take 1000 mg twice a day   Get a brace to wear at night; ergonomic keyboard    Carpal Tunnel Syndrome Carpal tunnel syndrome is a condition that causes pain in your hand and arm. The carpal tunnel is a narrow area located on the palm side of your wrist. Repeated wrist motion or certain diseases may cause swelling within the tunnel. This swelling pinches the main nerve in the wrist (median nerve). What are the causes? This condition may be caused by:  Repeated wrist motions.  Wrist injuries.  Arthritis.  A cyst or tumor in the carpal tunnel.  Fluid buildup during pregnancy.  Sometimes the cause of this condition is not known. What increases the risk? This condition is more likely to develop in:  People who have jobs that cause them to repeatedly move their wrists in the same motion, such as Health visitorbutchers and cashiers.  Women.  People with certain conditions, such as: ? Diabetes. ? Obesity. ? An underactive thyroid (hypothyroidism). ? Kidney failure.  What are the signs or symptoms? Symptoms of this condition include:  A tingling feeling in your fingers, especially in your thumb, index, and middle fingers.  Tingling or numbness in your hand.  An aching feeling in your entire arm, especially when your wrist and elbow are bent for long periods of time.  Wrist pain that goes up your arm to your shoulder.  Pain that goes down into your palm or fingers.  A weak feeling in your hands. You may have trouble grabbing and holding items.  Your symptoms may feel worse during the night. How is this diagnosed? This condition is diagnosed with a medical history and physical exam. You may also have tests, including:  An electromyogram (EMG). This test measures electrical signals sent by your nerves into the muscles.  X-rays.  How is this treated? Treatment for this condition includes:  Lifestyle changes. It is important to  stop doing or modify the activity that caused your condition.  Physical or occupational therapy.  Medicines for pain and inflammation. This may include medicine that is injected into your wrist.  A wrist splint.  Surgery.  Follow these instructions at home: If you have a splint:  Wear it as told by your health care provider. Remove it only as told by your health care provider.  Loosen the splint if your fingers become numb and tingle, or if they turn cold and blue.  Keep the splint clean and dry. General instructions  Take over-the-counter and prescription medicines only as told by your health care provider.  Rest your wrist from any activity that may be causing your pain. If your condition is work related, talk to your employer about changes that can be made, such as getting a wrist pad to use while typing.  If directed, apply ice to the painful area: ? Put ice in a plastic bag. ? Place a towel between your skin and the bag. ? Leave the ice on for 20 minutes, 2-3 times per day.  Keep all follow-up visits as told by your health care provider. This is important.  Do any exercises as told by your health care provider, physical therapist, or occupational therapist. Contact a health care provider if:  You have new symptoms.  Your pain is not controlled with medicines.  Your symptoms get worse. This information is not intended to replace advice given to you by your health care provider. Make sure you discuss any questions  you have with your health care provider. Document Released: 10/15/2000 Document Revised: 02/26/2016 Document Reviewed: 03/05/2015 Elsevier Interactive Patient Education  2017 [employer]

## 2017-10-19 LAB — CBC WITH DIFFERENTIAL/PLATELET
BASOS PCT: 0.6 %
Basophils Absolute: 44 cells/uL (ref 0–200)
EOS ABS: 131 {cells}/uL (ref 15–500)
Eosinophils Relative: 1.8 %
HCT: 36.8 % (ref 35.0–45.0)
Hemoglobin: 12 g/dL (ref 11.7–15.5)
Lymphs Abs: 4030 cells/uL — ABNORMAL HIGH (ref 850–3900)
MCH: 25.4 pg — AB (ref 27.0–33.0)
MCHC: 32.6 g/dL (ref 32.0–36.0)
MCV: 78 fL — AB (ref 80.0–100.0)
MONOS PCT: 7.6 %
MPV: 10.8 fL (ref 7.5–12.5)
NEUTROS PCT: 34.8 %
Neutro Abs: 2540 cells/uL (ref 1500–7800)
Platelets: 395 10*3/uL (ref 140–400)
RBC: 4.72 10*6/uL (ref 3.80–5.10)
RDW: 13.4 % (ref 11.0–15.0)
TOTAL LYMPHOCYTE: 55.2 %
WBC mixed population: 555 cells/uL (ref 200–950)
WBC: 7.3 10*3/uL (ref 3.8–10.8)

## 2017-10-19 LAB — BASIC METABOLIC PANEL WITH GFR
BUN: 15 mg/dL (ref 7–25)
CALCIUM: 9.6 mg/dL (ref 8.6–10.4)
CHLORIDE: 99 mmol/L (ref 98–110)
CO2: 33 mmol/L — ABNORMAL HIGH (ref 20–32)
CREATININE: 0.5 mg/dL (ref 0.50–1.05)
GFR, EST AFRICAN AMERICAN: 125 mL/min/{1.73_m2} (ref >=60)
GFR, Est Non African American: 107 mL/min/{1.73_m2} (ref >=60)
Glucose, Bld: 117 mg/dL — ABNORMAL HIGH (ref 65–99)
Potassium: 4.7 mmol/L (ref 3.5–5.3)
Sodium: 138 mmol/L (ref 135–146)

## 2017-10-19 LAB — LIPID PANEL
Cholesterol: 189 mg/dL (ref <200)
HDL: 106 mg/dL (ref >50)
LDL Cholesterol (Calc): 64 mg/dL (calc)
Non-HDL Cholesterol (Calc): 83 mg/dL (calc) (ref <130)
TRIGLYCERIDES: 105 mg/dL (ref <150)
Total CHOL/HDL Ratio: 1.8 (calc) (ref <5.0)

## 2017-10-19 LAB — HEMOGLOBIN A1C
EAG (MMOL/L): 9 (calc)
Hgb A1c MFr Bld: 7.3 % of total Hgb — ABNORMAL HIGH (ref <5.7)
Mean Plasma Glucose: 163 (calc)

## 2017-10-19 LAB — MAGNESIUM: MAGNESIUM: 1.8 mg/dL (ref 1.5–2.5)

## 2017-10-19 LAB — HEPATIC FUNCTION PANEL
AG Ratio: 1.7 (calc) (ref 1.0–2.5)
ALBUMIN MSPROF: 4.3 g/dL (ref 3.6–5.1)
ALT: 15 U/L (ref 6–29)
AST: 14 U/L (ref 10–35)
Alkaline phosphatase (APISO): 60 U/L (ref 33–130)
BILIRUBIN DIRECT: 0.1 mg/dL (ref 0.0–0.2)
Globulin: 2.6 g/dL (calc) (ref 1.9–3.7)
Indirect Bilirubin: 0.2 mg/dL (calc) (ref 0.2–1.2)
Total Bilirubin: 0.3 mg/dL (ref 0.2–1.2)
Total Protein: 6.9 g/dL (ref 6.1–8.1)

## 2017-10-19 LAB — VITAMIN D 25 HYDROXY (VIT D DEFICIENCY, FRACTURES): VIT D 25 HYDROXY: 78 ng/mL (ref 30–100)

## 2017-10-19 LAB — TSH: TSH: 0.65 m[IU]/L (ref 0.40–4.50)

## 2017-11-08 ENCOUNTER — Other Ambulatory Visit: Payer: Self-pay | Admitting: Internal Medicine

## 2017-11-12 ENCOUNTER — Other Ambulatory Visit: Payer: Self-pay | Admitting: Physician Assistant

## 2017-11-12 DIAGNOSIS — G8929 Other chronic pain: Secondary | ICD-10-CM

## 2017-11-12 DIAGNOSIS — M545 Low back pain: Secondary | ICD-10-CM

## 2017-11-12 DIAGNOSIS — G5711 Meralgia paresthetica, right lower limb: Secondary | ICD-10-CM

## 2017-11-29 DIAGNOSIS — G4733 Obstructive sleep apnea (adult) (pediatric): Secondary | ICD-10-CM | POA: Diagnosis not present

## 2017-12-02 ENCOUNTER — Other Ambulatory Visit: Payer: Self-pay | Admitting: Physician Assistant

## 2018-01-19 ENCOUNTER — Ambulatory Visit: Payer: Self-pay | Admitting: Physician Assistant

## 2018-01-27 ENCOUNTER — Other Ambulatory Visit: Payer: Self-pay | Admitting: Cardiovascular Disease

## 2018-01-27 NOTE — Telephone Encounter (Signed)
Refill request

## 2018-02-07 ENCOUNTER — Other Ambulatory Visit: Payer: Self-pay | Admitting: Physician Assistant

## 2018-02-07 DIAGNOSIS — M545 Low back pain: Secondary | ICD-10-CM

## 2018-02-07 DIAGNOSIS — G8929 Other chronic pain: Secondary | ICD-10-CM

## 2018-02-07 DIAGNOSIS — G5711 Meralgia paresthetica, right lower limb: Secondary | ICD-10-CM

## 2018-02-11 ENCOUNTER — Other Ambulatory Visit: Payer: Self-pay | Admitting: Internal Medicine

## 2018-02-28 ENCOUNTER — Other Ambulatory Visit: Payer: Self-pay | Admitting: Cardiovascular Disease

## 2018-02-28 DIAGNOSIS — G4733 Obstructive sleep apnea (adult) (pediatric): Secondary | ICD-10-CM | POA: Diagnosis not present

## 2018-03-08 ENCOUNTER — Other Ambulatory Visit: Payer: Self-pay

## 2018-03-09 ENCOUNTER — Other Ambulatory Visit: Payer: Self-pay

## 2018-03-09 MED ORDER — HYDRALAZINE HCL 25 MG PO TABS: 25.0000 mg | ORAL_TABLET | Freq: Three times a day (TID) | ORAL | 0 refills | Status: DC

## 2018-03-29 ENCOUNTER — Other Ambulatory Visit: Payer: Self-pay | Admitting: Internal Medicine

## 2018-03-31 ENCOUNTER — Other Ambulatory Visit: Payer: Self-pay | Admitting: Internal Medicine

## 2018-04-03 ENCOUNTER — Other Ambulatory Visit: Payer: Self-pay | Admitting: Internal Medicine

## 2018-04-03 DIAGNOSIS — Z1231 Encounter for screening mammogram for malignant neoplasm of breast: Secondary | ICD-10-CM

## 2018-04-07 DIAGNOSIS — E119 Type 2 diabetes mellitus without complications: Secondary | ICD-10-CM | POA: Diagnosis not present

## 2018-04-07 LAB — HM DIABETES EYE EXAM

## 2018-04-10 NOTE — Progress Notes (Signed)
FOLLOW UP  Assessment and Plan:   Hypertension Controlled with current medications Monitor blood pressure at home; patient to call if consistently greater than 130/80 Continue DASH diet.   Reminder to go to the ER if any CP, SOB, nausea, dizziness, severe HA, changes vision/speech, left arm numbness and tingling and jaw pain.  Cholesterol Well managed by lifestyle without medication Continue low cholesterol diet and exercise.  Check lipid panel.   Diabetes without complications Continue medication: metformin Continue diet and exercise.  Perform daily foot/skin check, notify office of any concerning changes.  Check A1C  Morbid Obesity with co morbidities Long discussion about weight loss, diet, and exercise Recommended diet heavy in fruits and veggies and low in animal meats, cheeses, and dairy products, appropriate calorie intake Discussed ideal weight for height -  Patient will - continue exercise, low carb, watch portions, increase fluids, start weighing once weekly Will follow up in 3 months  Vitamin D Def/ osteoporosis prevention Continue supplementation; at goal Defer Vit D level  Left shoulder pain Intermittent, mild, non-specific Avoid sleeping on left side, take NSAID regularly for 1-2 weeks Follow up if not improving   Continue diet and meds as discussed. Further disposition pending results of labs. Discussed med's effects and SE's.   Over 30 minutes of exam, counseling, chart review, and critical decision making was performed.   Future Appointments  Date Time Provider Department Center  04/21/2018  4:30 PM [facility]    ----------------------------------------------------------------------------------------------------------------------  HPI 57 y.o. female  presents for 3 month follow up on hypertension, cholesterol, T2DM, morbid obesity and vitamin D deficiency. She reports left hand tingling much improved with brace at night; today  she reports intermittent mild left posterior shoulder pain, worse after waking up in the AM. Non-radiating, not aggravated by any notable movements. Not reproducible in office.   BMI is Body mass index is 40.77 kg/m., she has been walking 2 miles/day (40 min). She has recently started cutting back on carbs and increasing protein.  Wt Readings from Last 3 Encounters:  04/11/18 245 lb (111.1 kg)  10/18/17 234 lb (106.1 kg)  07/05/17 236 lb 9.6 oz (107.3 kg)   Her blood pressure has been controlled at home, today their BP is BP: 140/70  She does not currently work out.  She denies chest pain, shortness of breath, dizziness.   She is not on cholesterol medication. Her cholesterol is at goal. The cholesterol last visit was:   Lab Results  Component Value Date   CHOL 189 10/18/2017   HDL 106 10/18/2017   LDLCALC 64 10/18/2017   TRIG 105 10/18/2017   CHOLHDL 1.8 10/18/2017    She has not been working on diet and exercise for T2 diabetes, and denies increased appetite, nausea, paresthesia of the feet, polydipsia, polyuria, visual disturbances and vomiting. Doesn't check sugars, taking 4 tabs of metformin daily. Last A1C in the office was:  Lab Results  Component Value Date   HGBA1C 7.3 (H) 10/18/2017   She has non-neoplastic goiter that we are monitoring without changes in TSH:     Lab Results  Component Value Date   TSH 0.65 10/18/2017   Patient is on Vitamin D supplement and at goal at the last check:  Lab Results  Component Value Date   VD25OH 78 10/18/2017      Current Medications:  Current Outpatient Medications on File Prior to Visit  Medication Sig  . aspirin EC 81 MG tablet Take 81 mg  by mouth daily.    . B Complex-C (SUPER B COMPLEX PO) Take by mouth daily.  . bisoprolol-hydrochlorothiazide (ZIAC) 10-6.25 MG tablet TAKE 1 TABLET BY MOUTH EVERY DAY  . Cholecalciferol (VITAMIN D PO) Take 5,000 Int'l Units by mouth 3 (three) times a week.   . cloNIDine (CATAPRES) 0.2 MG  tablet TAKE 1 TABLET TWICE A DAY  . diclofenac sodium (VOLTAREN) 1 % GEL Apply 2 g topically 4 (four) times daily.  . furosemide (LASIX) 40 MG tablet TAKE 1 TABLET BY MOUTH TWICE A DAY FOR BLOOD PRESSURE & FLUID RETENTION  . gabapentin (NEURONTIN) 100 MG capsule TAKE 1 CAPSULE 2 TO 3 TIMES A DAY.  . hydrALAZINE (APRESOLINE) 25 MG tablet Take 1 tablet (25 mg total) by mouth 3 (three) times daily. NO REFILLS UNTIL SEEN CALL FOR APPT  . losartan (COZAAR) 100 MG tablet TAKE 1 TABLET (100 MG TOTAL) BY MOUTH DAILY.  . Magnesium 250 MG TABS Take 250 mg by mouth daily.  . metFORMIN (GLUCOPHAGE-XR) 500 MG 24 hr tablet TAKE 2 TABLETS (1,000 MG TOTAL) BY MOUTH 2 (TWO) TIMES DAILY.  . Multiple Vitamins-Minerals (MULTIVITAMIN PO) Take by mouth daily.  . Vitamin D, Ergocalciferol, (DRISDOL) 50000 units CAPS capsule TAKE ONE CAPSULE BY MOUTH TWICE A WEEK (Patient not taking: Reported on 04/11/2018)   No current facility-administered medications on file prior to visit.      Allergies:  Allergies  Allergen Reactions  . Amlodipine Swelling  . Naproxen Hives  . Meloxicam & Diet Manage Prod Rash     Medical History:  Past Medical History:  Diagnosis Date  . Allergy   . Diabetes mellitus   . Goiter   . Hyperlipidemia   . Hypertension   . Morbid obesity (HCC) 12/24/2013  . OSA (obstructive sleep apnea)   . Palpitations 08/01/2016  . Sarcoid   . Sarcoidosis   . Vitamin D deficiency    Family history- Reviewed and unchanged Social history- Reviewed and unchanged   Review of Systems:  Review of Systems  Constitutional: Negative for malaise/fatigue and weight loss.  HENT: Negative for hearing loss and tinnitus.   Eyes: Negative for blurred vision and double vision.  Respiratory: Negative for cough, shortness of breath and wheezing.   Cardiovascular: Negative for chest pain, palpitations, orthopnea, claudication and leg swelling.  Gastrointestinal: Negative for abdominal pain, blood in stool,  constipation, diarrhea, heartburn, melena, nausea and vomiting.  Genitourinary: Negative.   Musculoskeletal: Positive for back pain (Intermittent R side - significantly improved). Negative for joint pain and myalgias.  Skin: Negative for rash.  Neurological: Positive for tingling (Left hand intermittently). Negative for dizziness, sensory change, weakness and headaches.  Endo/Heme/Allergies: Negative for polydipsia.  Psychiatric/Behavioral: Negative.   All other systems reviewed and are negative.   Physical Exam: BP 140/70   Pulse 68   Temp (!) 97.3 F (36.3 C)   Ht 5\' 5"  (1.651 m)   Wt 245 lb (111.1 kg)   SpO2 98%   BMI 40.77 kg/m  Wt Readings from Last 3 Encounters:  04/11/18 245 lb (111.1 kg)  10/18/17 234 lb (106.1 kg)  07/05/17 236 lb 9.6 oz (107.3 kg)   General Appearance: Well nourished, in no apparent distress. Eyes: PERRLA, EOMs, conjunctiva no swelling or erythema Sinuses: No Frontal/maxillary tenderness ENT/Mouth: Ext aud canals clear, TMs without erythema, bulging. No erythema, swelling, or exudate on post pharynx.  Tonsils not swollen or erythematous. Hearing normal.  Neck: Supple, thyroid normal.  Respiratory: Respiratory effort normal,  BS equal bilaterally without rales, rhonchi, wheezing or stridor.  Cardio: RRR with no MRGs. Brisk peripheral pulses without edema.  Abdomen: Soft, + BS.  Non tender, no guarding, rebound, hernias, masses. Lymphatics: Non tender without lymphadenopathy.  Musculoskeletal: Full ROM, 5/5 strength, Normal gait. No joint effusion or palpable abnormality to left shoulder.  Skin: Warm, dry without rashes, lesions, ecchymosis.  Neuro: Cranial nerves intact. No cerebellar symptoms.  Psych: Awake and oriented X 3, normal affect, Insight and Judgment appropriate.    Dan Maker, NP 5:02 PM [facility]

## 2018-04-11 ENCOUNTER — Ambulatory Visit: Payer: 59 | Admitting: Adult Health

## 2018-04-11 ENCOUNTER — Encounter: Payer: Self-pay | Admitting: Adult Health

## 2018-04-11 VITALS — BP 140/70 | HR 68 | Temp 97.3°F | Ht 65.0 in | Wt 245.0 lb

## 2018-04-11 DIAGNOSIS — Z79899 Other long term (current) drug therapy: Secondary | ICD-10-CM | POA: Diagnosis not present

## 2018-04-11 DIAGNOSIS — E119 Type 2 diabetes mellitus without complications: Secondary | ICD-10-CM | POA: Diagnosis not present

## 2018-04-11 DIAGNOSIS — M25512 Pain in left shoulder: Secondary | ICD-10-CM | POA: Diagnosis not present

## 2018-04-11 DIAGNOSIS — E782 Mixed hyperlipidemia: Secondary | ICD-10-CM | POA: Diagnosis not present

## 2018-04-11 DIAGNOSIS — E049 Nontoxic goiter, unspecified: Secondary | ICD-10-CM | POA: Diagnosis not present

## 2018-04-11 DIAGNOSIS — E559 Vitamin D deficiency, unspecified: Secondary | ICD-10-CM | POA: Diagnosis not present

## 2018-04-11 DIAGNOSIS — I10 Essential (primary) hypertension: Secondary | ICD-10-CM | POA: Diagnosis not present

## 2018-04-11 MED ORDER — HYDRALAZINE HCL 25 MG PO TABS: 25.0000 mg | ORAL_TABLET | Freq: Three times a day (TID) | ORAL | 1 refills | Status: DC

## 2018-04-11 NOTE — Patient Instructions (Signed)
Aim for 7+ servings of fruits and vegetables daily  80+ fluid ounces of water or unsweet tea for healthy kidneys  Limit alcohol intake  Limit animal fats in diet for cholesterol and heart health - choose grass fed whenever available  Aim for low stress - take time to unwind and care for your mental health  Aim for 150 min of moderate intensity exercise weekly for heart health, and weights twice weekly for bone health  Aim for 7-9 hours of sleep daily    Drink 1/2 your body weight in fluid ounces of water daily; drink a tall glass of water 30 min before meals  Don't eat until you're stuffed- listen to your stomach and eat until you are 80% full   Try eating off of a salad plate; wait 10 min after finishing before going back for seconds  Start by eating the vegetables on your plate; aim for 50% of your meals to be fruits or vegetables  Then eat your protein - lean meats (grass fed if possible), fish, beans, nuts in moderation  Eat your carbs/starch last ONLY if you still are hungry. If you can, stop before finishing it all  Avoid sugar and flour - the closer it looks to it's original form in nature, typically the better it is for you  Splurge in moderation - "assign" days when you get to splurge and have the "bad stuff" - I like to follow a 80% - 20% plan- "good" choices 80 % of the time, "bad" choices in moderation 20% of the time  Simple equation is: Calories out > calories in = weight loss - even if you eat the bad stuff, if you limit portions, you will still lose weight       When it comes to diets, agreement about the perfect plan isn't easy to find, even among the experts. Experts at the [employer] developed an idea known as the Healthy Eating Plate. Just imagine a plate divided into logical, healthy portions.  The emphasis is on diet quality:  Load up on vegetables and fruits - one-half of your plate: Aim for color and variety, and remember that  potatoes don't count.  Go for whole grains - one-quarter of your plate: Whole wheat, barley, wheat berries, quinoa, oats, brown rice, and foods made with them. If you want pasta, go with whole wheat pasta.  Protein power - one-quarter of your plate: Fish, chicken, beans, and nuts are all healthy, versatile protein sources. Limit red meat.  The diet, however, does go beyond the plate, offering a few other suggestions.  Use healthy plant oils, such as olive, canola, soy, corn, sunflower and peanut. Check the labels, and avoid partially hydrogenated oil, which have unhealthy trans fats.  If you're thirsty, drink water. Coffee and tea are good in moderation, but skip sugary drinks and limit milk and dairy products to one or two daily servings.  The type of carbohydrate in the diet is more important than the amount. Some sources of carbohydrates, such as vegetables, fruits, whole grains, and beans-are healthier than others.  Finally, stay active.   

## 2018-04-12 LAB — LIPID PANEL
CHOLESTEROL: 177 mg/dL (ref <200)
HDL: 91 mg/dL (ref >50)
LDL CHOLESTEROL (CALC): 69 mg/dL
Non-HDL Cholesterol (Calc): 86 mg/dL (calc) (ref <130)
TRIGLYCERIDES: 90 mg/dL (ref <150)
Total CHOL/HDL Ratio: 1.9 (calc) (ref <5.0)

## 2018-04-12 LAB — CBC WITH DIFFERENTIAL/PLATELET
BASOS ABS: 32 {cells}/uL (ref 0–200)
Basophils Relative: 0.4 %
EOS ABS: 122 {cells}/uL (ref 15–500)
Eosinophils Relative: 1.5 %
HCT: 36 % (ref 35.0–45.0)
Hemoglobin: 11.9 g/dL (ref 11.7–15.5)
Lymphs Abs: 3815 cells/uL (ref 850–3900)
MCH: 25.6 pg — AB (ref 27.0–33.0)
MCHC: 33.1 g/dL (ref 32.0–36.0)
MCV: 77.4 fL — AB (ref 80.0–100.0)
MONOS PCT: 7.2 %
MPV: 11.1 fL (ref 7.5–12.5)
NEUTROS PCT: 43.8 %
Neutro Abs: 3548 cells/uL (ref 1500–7800)
PLATELETS: 401 10*3/uL — AB (ref 140–400)
RBC: 4.65 10*6/uL (ref 3.80–5.10)
RDW: 13.7 % (ref 11.0–15.0)
TOTAL LYMPHOCYTE: 47.1 %
WBC: 8.1 10*3/uL (ref 3.8–10.8)
WBCMIX: 583 {cells}/uL (ref 200–950)

## 2018-04-12 LAB — COMPLETE METABOLIC PANEL WITH GFR
AG RATIO: 1.7 (calc) (ref 1.0–2.5)
ALKALINE PHOSPHATASE (APISO): 54 U/L (ref 33–130)
ALT: 18 U/L (ref 6–29)
AST: 18 U/L (ref 10–35)
Albumin: 4.3 g/dL (ref 3.6–5.1)
BILIRUBIN TOTAL: 0.5 mg/dL (ref 0.2–1.2)
BUN: 15 mg/dL (ref 7–25)
CHLORIDE: 101 mmol/L (ref 98–110)
CO2: 30 mmol/L (ref 20–32)
Calcium: 9.4 mg/dL (ref 8.6–10.4)
Creat: 0.55 mg/dL (ref 0.50–1.05)
GFR, EST AFRICAN AMERICAN: 120 mL/min/{1.73_m2} (ref >=60)
GFR, Est Non African American: 103 mL/min/{1.73_m2} (ref >=60)
GLUCOSE: 125 mg/dL — AB (ref 65–99)
Globulin: 2.5 g/dL (calc) (ref 1.9–3.7)
POTASSIUM: 4.1 mmol/L (ref 3.5–5.3)
Sodium: 140 mmol/L (ref 135–146)
TOTAL PROTEIN: 6.8 g/dL (ref 6.1–8.1)

## 2018-04-12 LAB — HEMOGLOBIN A1C
EAG (MMOL/L): 9.7 (calc)
Hgb A1c MFr Bld: 7.7 % of total Hgb — ABNORMAL HIGH (ref <5.7)
Mean Plasma Glucose: 174 (calc)

## 2018-04-12 LAB — TSH: TSH: 0.45 mIU/L (ref 0.40–4.50)

## 2018-04-14 ENCOUNTER — Encounter: Payer: Self-pay | Admitting: *Deleted

## 2018-04-21 ENCOUNTER — Ambulatory Visit
Admission: RE | Admit: 2018-04-21 | Discharge: 2018-04-21 | Disposition: A | Discharge status: Home / Self Care | Payer: 59 | Source: Ambulatory Visit | Attending: Internal Medicine | Admitting: Internal Medicine

## 2018-04-21 DIAGNOSIS — Z1231 Encounter for screening mammogram for malignant neoplasm of breast: Secondary | ICD-10-CM

## 2018-04-28 ENCOUNTER — Other Ambulatory Visit: Payer: Self-pay | Admitting: Internal Medicine

## 2018-05-11 ENCOUNTER — Other Ambulatory Visit: Payer: Self-pay | Admitting: Physician Assistant

## 2018-05-11 DIAGNOSIS — M545 Low back pain, unspecified: Secondary | ICD-10-CM

## 2018-05-11 DIAGNOSIS — G8929 Other chronic pain: Secondary | ICD-10-CM

## 2018-05-11 DIAGNOSIS — G5711 Meralgia paresthetica, right lower limb: Secondary | ICD-10-CM

## 2018-05-18 ENCOUNTER — Other Ambulatory Visit: Payer: Self-pay | Admitting: Physician Assistant

## 2018-05-25 ENCOUNTER — Encounter: Payer: Self-pay | Admitting: Physician Assistant

## 2018-06-02 ENCOUNTER — Other Ambulatory Visit: Payer: Self-pay | Admitting: Internal Medicine

## 2018-07-13 NOTE — Progress Notes (Signed)
Complete Physical  Assessment and Plan:  Encounter for general adult medical examination with abnormal findings - follow up GYN  Essential hypertension - continue medications, DASH diet, exercise and monitor at home.  -     CBC with Differential/Platelet -     CMP/GFR -     TSH -     Urinalysis, Routine w reflex microscopic -     Microalbumin / creatinine urine ratio -     EKG 12-Lead  Diabetes mellitus without complication (HCC) Discussed general issues about diabetes pathophysiology and management., Educational material distributed., Suggested low cholesterol diet., Encouraged aerobic exercise., Discussed foot care., Reminded to get yearly retinal exam. -     Hemoglobin A1c  OSA (obstructive sleep apnea) Sleep apnea- continue CPAP, CPAP is helping with daytime fatigue, weight loss still advised.   Mixed hyperlipidemia -continue medications, check lipids, decrease fatty foods, increase activity. -     Lipid panel  Obesity, Class III, BMI 40-49.9 (morbid obesity) (HCC) - long discussion about weight loss, diet, and exercise  Palpitations Control triggers, none recently  Vitamin D deficiency Continue supplementation Check vitamin D level  Goiter Biopsy negative, monitor  Environmental allergies Continue OTC allergy pills  Tortuous aorta (HCC) Control blood pressure, cholesterol, glucose, increase exercise.   Chronic right-sided low back pain without sciatica Established with Dr. Magnus Ivan, completing PT, gabapentin PRN Improved with walking  Medication management -     Magnesium  Anemia, unspecified type -     Iron and TIBC -     Vitamin B12  Discussed med's effects and SE's. Screening labs and tests as requested with regular follow-up as recommended. Over 40 minutes of exam, counseling, chart review, and complex, high level critical decision making was performed this visit.   Future Appointments  Date Time Provider Department Center  07/18/2019  3:00 PM  Judd Gaudier, NP [facility] None    HPI  57 y.o. 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  She has hx of  right hip/back pain, and bilateral knee pain, seeing Dr. Magnus Ivan, but reports symptoms improved with daily walking. Takes gabapentin PRN pain.   BMI is Body mass index is 39.71 kg/m., she has been working on diet and exercise. Walking 2 miles daily. She has OSA, on CPAP, does well with this.  Wt Readings from Last 3 Encounters:  07/17/18 235 lb (106.6 kg)  04/11/18 245 lb (111.1 kg)  10/18/17 234 lb (106.1 kg)   Her blood pressure has been controlled at home, today their BP is BP: (!) 150/80 She does workout. She denies chest pain, shortness of breath, dizziness. Normal Echo 2017 She has a history of sarcoidosis, diagnosed in 2012 due to orbital abnormality on CT scan, continues to follow up with Dr. Ophelia Charter in [place] retinal specialist, had normal CT chest 2012, normal CXR 2017.   She is not on cholesterol medication and denies myalgias. Her cholesterol is at goal. The cholesterol last visit was:   Lab Results  Component Value Date   CHOL 177 04/11/2018   HDL 91 04/11/2018   LDLCALC 69 04/11/2018  TRIG 90 04/11/2018   CHOLHDL 1.9 04/11/2018   She has been working on diet and exercise for Diabetes without complications, currently treated by metformin 2000 mg daily, she is on bASA, she is on ACE/ARB, does not check her sugars at home, and denies nausea, paresthesia  of the feet, polydipsia and polyuria. Last A1C was:  Lab Results  Component Value Date   HGBA1C 7.7 (H) 04/11/2018   Last GFR: Lab Results  Component Value Date   GFRAA 120 04/11/2018   Patient is on Vitamin D supplement.   Lab Results  Component Value Date   VD25OH 78 10/18/2017        Current Medications:  Current Outpatient Medications on File Prior to Visit  Medication Sig Dispense Refill  . aspirin EC 81 MG tablet Take 81 mg by mouth daily.      . B Complex-C (SUPER B COMPLEX PO) Take by mouth daily.    . bisoprolol-hydrochlorothiazide (ZIAC) 10-6.25 MG tablet TAKE 1 TABLET BY MOUTH EVERY DAY 90 tablet 1  . Cholecalciferol (VITAMIN D PO) Take 5,000 Int'l Units by mouth 3 (three) times a week.     . cloNIDine (CATAPRES) 0.2 MG tablet TAKE 1 TABLET TWICE A DAY 180 tablet 1  . diclofenac sodium (VOLTAREN) 1 % GEL Apply 2 g topically 4 (four) times daily. 100 g 3  . furosemide (LASIX) 40 MG tablet TAKE 1 TABLET BY MOUTH TWICE A DAY FOR BLOOD PRESSURE & FLUID RETENTION 180 tablet 1  . gabapentin (NEURONTIN) 100 MG capsule TAKE 1 CAPSULE 2 TO 3 TIMES A DAY. 270 capsule 0  . hydrALAZINE (APRESOLINE) 25 MG tablet Take 1 tablet (25 mg total) by mouth 3 (three) times daily. NO REFILLS UNTIL SEEN CALL FOR APPT 270 tablet 1  . losartan (COZAAR) 100 MG tablet TAKE 1 TABLET (100 MG TOTAL) BY MOUTH DAILY. 90 tablet 1  . Magnesium 250 MG TABS Take 250 mg by mouth daily.    . metFORMIN (GLUCOPHAGE-XR) 500 MG 24 hr tablet TAKE 2 TABLETS BY MOUTH TWICE A DAY 360 tablet 1  . Multiple Vitamins-Minerals (MULTIVITAMIN PO) Take by mouth daily.    . Vitamin D, Ergocalciferol, (DRISDOL) 50000 units CAPS capsule TAKE ONE CAPSULE BY MOUTH TWICE A WEEK 24 capsule 2   No current facility-administered medications on file prior to visit.    Allergies:  Allergies  Allergen Reactions  . Amlodipine Swelling  . Naproxen Hives  . Meloxicam & Diet Manage Prod Rash   Medical History:  She has  Hypertension; Hyperlipidemia associated with type 2 diabetes mellitus (HCC); Diabetes mellitus without complication (HCC); Environmental allergies; OSA (obstructive sleep apnea); Goiter; Sarcoid; Vitamin D deficiency; Morbid obesity (HCC); Palpitations; Meralgia paresthetica of right side; Tortuous aorta (HCC); Acute right-sided low back pain with right-sided sciatica; and Carpal tunnel syndrome of left wrist on their problem list.  Health Maintenance:   Immunization History  Administered Date(s) Administered  . Influenza Split 07/18/2013, 10/01/2014  . Influenza,inj,quad, With Preservative 09/02/2016  . Pneumococcal Polysaccharide-23 06/16/1999  . Tdap 03/10/2009   Tetanus: 2010 Pneumovax: 2000 Flu vaccine: 2019 - today Shingles: DUE, wants to call and ask  LMP:  Hysterectomy, sees GYN MGM:  04/2018 Colonoscopy: 2014 due 10 years Echo 2017  Last Eye Exam: Dr. Hyacinth MeekerMiller, 04/07/2018- report verified and abstracted Last Dental Exam: Dr. Dan HumphreysWalker, last visit 2019, goes q4133m  Patient Care Team: Tanya [name], William, MD as PCP - General (Internal Medicine)  Surgical History:  She has a past surgical  history that includes Eye surgery; achiles tendon; Abdominal hysterectomy; and Tonsillectomy. Family History:  Herfamily history includes Diabetes in her father and paternal grandmother; Heart disease in her father, paternal grandmother, and sister; Hypertension in her brother, mother, and sister. Social History:  She reports that she has never smoked. She has never used smokeless tobacco. She reports that she does not drink alcohol or use drugs.  Review of Systems: Review of Systems  Constitutional: Negative for chills, fever and malaise/fatigue.  HENT: Negative for congestion, ear pain and sore throat.   Eyes: Negative.   Respiratory: Negative for cough, shortness of breath and wheezing.   Cardiovascular: Negative for chest pain, palpitations and leg swelling.  Gastrointestinal: Negative for  abdominal pain, blood in stool, constipation, diarrhea, heartburn and melena.  Genitourinary: Negative.   Musculoskeletal: Negative for back pain, falls, joint pain, myalgias and neck pain.  Skin: Negative.   Neurological: Negative for dizziness, sensory change, loss of consciousness and headaches.  Psychiatric/Behavioral: Negative for depression. The patient is not nervous/anxious and does not have insomnia.     Physical Exam: Estimated body mass index is 39.71 kg/m as calculated from the following:   Height as of this encounter: 5' 4.5" (1.638 m).   Weight as of this encounter: 235 lb (106.6 kg). BP (!) 150/80   Pulse 64   Temp 97.7 F (36.5 C)   Ht 5' 4.5" (1.638 m)   Wt 235 lb (106.6 kg)   SpO2 98%   BMI 39.71 kg/m  General Appearance: Well nourished, in no apparent distress.  Eyes: PERRLA, EOMs, conjunctiva no swelling or erythema, normal fundi and vessels.  Sinuses: No Frontal/maxillary tenderness  ENT/Mouth: Ext aud canals clear, normal light reflex with TMs without erythema, bulging. Good dentition. No erythema, swelling, or exudate on post pharynx. Tonsils not swollen or erythematous. Hearing normal.  Neck: Supple, thyroid normal. No bruits  Respiratory: Respiratory effort normal, BS equal bilaterally without rales, rhonchi, wheezing or stridor.  Cardio: RRR without murmurs, rubs or gallops. Brisk peripheral pulses without edema.  Chest: symmetric, with normal excursions and percussion.  Breasts: Defer to GYN Abdomen: Soft, nontender, no guarding, rebound, hernias, masses, or organomegaly.  Lymphatics: Non tender without lymphadenopathy.  Genitourinary: Defer to GYN Musculoskeletal: Full ROM all peripheral extremities,5/5 strength, and normal gait.  Skin: Warm, dry without rashes, lesions, ecchymosis. Neuro: Cranial nerves intact, reflexes equal bilaterally. Normal muscle tone, no cerebellar symptoms. Sensation intact.  Psych: Awake and oriented X 3, normal affect,  Insight and Judgment appropriate.   EKG: Sinus bradycardia, no ST changes.  Tanya [name] 2:08 PM [facility]

## 2018-07-17 ENCOUNTER — Encounter: Payer: Self-pay | Admitting: Adult Health

## 2018-07-17 ENCOUNTER — Ambulatory Visit: Payer: 59 | Admitting: Adult Health

## 2018-07-17 VITALS — BP 150/80 | HR 64 | Temp 97.7°F | Ht 64.5 in | Wt 235.0 lb

## 2018-07-17 DIAGNOSIS — I771 Stricture of artery: Secondary | ICD-10-CM

## 2018-07-17 DIAGNOSIS — D649 Anemia, unspecified: Secondary | ICD-10-CM | POA: Diagnosis not present

## 2018-07-17 DIAGNOSIS — Z23 Encounter for immunization: Secondary | ICD-10-CM | POA: Diagnosis not present

## 2018-07-17 DIAGNOSIS — E049 Nontoxic goiter, unspecified: Secondary | ICD-10-CM

## 2018-07-17 DIAGNOSIS — Z9109 Other allergy status, other than to drugs and biological substances: Secondary | ICD-10-CM

## 2018-07-17 DIAGNOSIS — R002 Palpitations: Secondary | ICD-10-CM

## 2018-07-17 DIAGNOSIS — E119 Type 2 diabetes mellitus without complications: Secondary | ICD-10-CM

## 2018-07-17 DIAGNOSIS — Z0000 Encounter for general adult medical examination without abnormal findings: Secondary | ICD-10-CM | POA: Diagnosis not present

## 2018-07-17 DIAGNOSIS — D869 Sarcoidosis, unspecified: Secondary | ICD-10-CM

## 2018-07-17 DIAGNOSIS — I10 Essential (primary) hypertension: Secondary | ICD-10-CM | POA: Diagnosis not present

## 2018-07-17 DIAGNOSIS — E559 Vitamin D deficiency, unspecified: Secondary | ICD-10-CM

## 2018-07-17 DIAGNOSIS — E1169 Type 2 diabetes mellitus with other specified complication: Secondary | ICD-10-CM | POA: Diagnosis not present

## 2018-07-17 DIAGNOSIS — G4733 Obstructive sleep apnea (adult) (pediatric): Secondary | ICD-10-CM

## 2018-07-17 DIAGNOSIS — E785 Hyperlipidemia, unspecified: Secondary | ICD-10-CM

## 2018-07-17 DIAGNOSIS — Z136 Encounter for screening for cardiovascular disorders: Secondary | ICD-10-CM | POA: Diagnosis not present

## 2018-07-17 DIAGNOSIS — G5602 Carpal tunnel syndrome, left upper limb: Secondary | ICD-10-CM

## 2018-07-17 DIAGNOSIS — M5441 Lumbago with sciatica, right side: Secondary | ICD-10-CM

## 2018-07-17 NOTE — Patient Instructions (Addendum)
Call insurance and ask about shingles vaccine (shingrix) -     [name] , Thank you for taking time to come for your Annual Wellness Visit. I appreciate your ongoing commitment to your health goals. Please review the following plan we discussed and let me know if I can assist you in the future.   These are the goals we discussed: Goals    . Blood Pressure < 130/80    . HEMOGLOBIN A1C < 7.0    . LDL CALC < 70    . Weight (lb) < 225 lb (102.1 kg)       This is a list of the screening recommended for you and due dates:  Health Maintenance  Topic Date Due  . HIV Screening  07/18/2019*  . Hemoglobin A1C  10/11/2018  . Tetanus Vaccine  03/11/2019  . Eye exam for diabetics  04/08/2019  . Complete foot exam   07/18/2019  . Mammogram  04/21/2020  . Colon Cancer Screening  11/24/2022  . Flu Shot  Completed  . Pneumococcal vaccine  Completed  .  Hepatitis C: One time screening is recommended by Center for Disease Control  (CDC) for  adults born from [birth year] through 1966.   Completed  . Pap Smear  Discontinued  *Topic was postponed. The date shown is not the original due date.     Know what a healthy weight is for you (roughly BMI <25) and aim to maintain this  Aim for 7+ servings of fruits and vegetables daily  65-80+ fluid ounces of water or unsweet tea for healthy kidneys  Limit to max 1 drink of alcohol per day; avoid smoking/tobacco  Limit animal fats in diet for cholesterol and heart health - choose grass fed whenever available  Avoid highly processed foods, and foods high in saturated/trans fats  Aim for low stress - take time to unwind and care for your mental health  Aim for 150 min of moderate intensity exercise weekly for heart health, and weights twice weekly for bone health  Aim for 7-9 hours of sleep daily       When it comes to diets, agreement about the perfect plan isn't easy to find, even among the experts. Experts at the [employer] [facility] [employer]  developed an idea known as the Healthy Eating Plate. Just imagine a plate divided into logical, healthy portions.  The emphasis is on diet quality:  Load up on vegetables and fruits - one-half of your plate: Aim for color and variety, and remember that potatoes don't count.  Go for whole grains - one-quarter of your plate: Whole wheat, barley, wheat berries, quinoa, oats, brown rice, and foods made with them. If you want pasta, go with whole wheat pasta.  Protein power - one-quarter of your plate: Fish, chicken, beans, and nuts are all healthy, versatile protein sources. Limit red meat.  The diet, however, does go beyond the plate, offering a few other suggestions.  Use healthy plant oils, such as olive, canola, soy, corn, sunflower and peanut. Check the labels, and avoid partially hydrogenated oil, which have unhealthy trans fats.  If you're thirsty, drink water. Coffee and tea are good in moderation, but skip sugary drinks and limit milk and dairy products to one or two daily servings.  The type of carbohydrate in the diet is more important than the amount. Some sources of carbohydrates, such as vegetables, fruits, whole grains, and beans-are healthier than others.  Finally, stay active.

## 2018-07-18 LAB — CBC WITH DIFFERENTIAL/PLATELET
BASOS ABS: 42 {cells}/uL (ref 0–200)
BASOS PCT: 0.7 %
EOS ABS: 108 {cells}/uL (ref 15–500)
EOS PCT: 1.8 %
HEMATOCRIT: 36.7 % (ref 35.0–45.0)
HEMOGLOBIN: 11.9 g/dL (ref 11.7–15.5)
LYMPHS ABS: 3234 {cells}/uL (ref 850–3900)
MCH: 25.5 pg — ABNORMAL LOW (ref 27.0–33.0)
MCHC: 32.4 g/dL (ref 32.0–36.0)
MCV: 78.6 fL — ABNORMAL LOW (ref 80.0–100.0)
MPV: 11.4 fL (ref 7.5–12.5)
Monocytes Relative: 6.5 %
NEUTROS ABS: 2226 {cells}/uL (ref 1500–7800)
Neutrophils Relative %: 37.1 %
Platelets: 384 10*3/uL (ref 140–400)
RBC: 4.67 10*6/uL (ref 3.80–5.10)
RDW: 14.2 % (ref 11.0–15.0)
Total Lymphocyte: 53.9 %
WBC mixed population: 390 cells/uL (ref 200–950)
WBC: 6 10*3/uL (ref 3.8–10.8)

## 2018-07-18 LAB — URINALYSIS W MICROSCOPIC + REFLEX CULTURE
BILIRUBIN URINE: NEGATIVE
Bacteria, UA: NONE SEEN /HPF
GLUCOSE, UA: NEGATIVE
Hgb urine dipstick: NEGATIVE
Hyaline Cast: NONE SEEN /LPF
Ketones, ur: NEGATIVE
Leukocyte Esterase: NEGATIVE
NITRITES URINE, INITIAL: NEGATIVE
PROTEIN: NEGATIVE
SQUAMOUS EPITHELIAL / LPF: NONE SEEN /HPF (ref <=5)
Specific Gravity, Urine: 1.029 (ref 1.001–1.03)
pH: 5.5 (ref 5.0–8.0)

## 2018-07-18 LAB — NO CULTURE INDICATED

## 2018-07-18 LAB — COMPLETE METABOLIC PANEL WITH GFR
AG RATIO: 1.7 (calc) (ref 1.0–2.5)
ALT: 14 U/L (ref 6–29)
AST: 15 U/L (ref 10–35)
Albumin: 4.2 g/dL (ref 3.6–5.1)
Alkaline phosphatase (APISO): 56 U/L (ref 33–130)
BILIRUBIN TOTAL: 0.3 mg/dL (ref 0.2–1.2)
BUN: 19 mg/dL (ref 7–25)
CHLORIDE: 101 mmol/L (ref 98–110)
CO2: 32 mmol/L (ref 20–32)
Calcium: 9.6 mg/dL (ref 8.6–10.4)
Creat: 0.59 mg/dL (ref 0.50–1.05)
GFR, EST NON AFRICAN AMERICAN: 101 mL/min/{1.73_m2} (ref >=60)
GFR, Est African American: 117 mL/min/{1.73_m2} (ref >=60)
GLOBULIN: 2.5 g/dL (ref 1.9–3.7)
Glucose, Bld: 145 mg/dL — ABNORMAL HIGH (ref 65–99)
POTASSIUM: 4 mmol/L (ref 3.5–5.3)
SODIUM: 140 mmol/L (ref 135–146)
Total Protein: 6.7 g/dL (ref 6.1–8.1)

## 2018-07-18 LAB — IRON, TOTAL/TOTAL IRON BINDING CAP
%SAT: 17 % (calc) (ref 16–45)
Iron: 57 ug/dL (ref 45–160)
TIBC: 338 mcg/dL (calc) (ref 250–450)

## 2018-07-18 LAB — LIPID PANEL
CHOLESTEROL: 189 mg/dL (ref <200)
HDL: 89 mg/dL (ref >50)
LDL CHOLESTEROL (CALC): 80 mg/dL
NON-HDL CHOLESTEROL (CALC): 100 mg/dL (ref <130)
Total CHOL/HDL Ratio: 2.1 (calc) (ref <5.0)
Triglycerides: 100 mg/dL (ref <150)

## 2018-07-18 LAB — HEMOGLOBIN A1C
HEMOGLOBIN A1C: 7.2 %{Hb} — AB (ref <5.7)
Mean Plasma Glucose: 160 (calc)
eAG (mmol/L): 8.9 (calc)

## 2018-07-18 LAB — VITAMIN B12: VITAMIN B 12: 383 pg/mL (ref 200–1100)

## 2018-07-18 LAB — MICROALBUMIN / CREATININE URINE RATIO
Creatinine, Urine: 143 mg/dL (ref 20–275)
MICROALB/CREAT RATIO: 24 ug/mg{creat} (ref <30)
Microalb, Ur: 3.4 mg/dL

## 2018-07-18 LAB — VITAMIN D 25 HYDROXY (VIT D DEFICIENCY, FRACTURES): Vit D, 25-Hydroxy: 75 ng/mL (ref 30–100)

## 2018-07-18 LAB — TSH: TSH: 0.31 mIU/L — ABNORMAL LOW (ref 0.40–4.50)

## 2018-07-18 LAB — MAGNESIUM: Magnesium: 1.8 mg/dL (ref 1.5–2.5)

## 2018-08-06 ENCOUNTER — Other Ambulatory Visit: Payer: Self-pay | Admitting: Internal Medicine

## 2018-08-20 ENCOUNTER — Other Ambulatory Visit: Payer: Self-pay | Admitting: Adult Health

## 2018-08-20 DIAGNOSIS — M545 Low back pain, unspecified: Secondary | ICD-10-CM

## 2018-08-20 DIAGNOSIS — G8929 Other chronic pain: Secondary | ICD-10-CM

## 2018-08-20 DIAGNOSIS — G5711 Meralgia paresthetica, right lower limb: Secondary | ICD-10-CM

## 2018-09-01 DIAGNOSIS — G4733 Obstructive sleep apnea (adult) (pediatric): Secondary | ICD-10-CM | POA: Diagnosis not present

## 2018-09-26 ENCOUNTER — Other Ambulatory Visit: Payer: Self-pay | Admitting: Adult Health

## 2018-09-26 ENCOUNTER — Other Ambulatory Visit: Payer: Self-pay | Admitting: Internal Medicine

## 2018-09-28 ENCOUNTER — Other Ambulatory Visit: Payer: Self-pay | Admitting: Internal Medicine

## 2018-10-06 ENCOUNTER — Other Ambulatory Visit: Payer: Self-pay | Admitting: Internal Medicine

## 2018-10-16 NOTE — Progress Notes (Deleted)
FOLLOW UP  Assessment and Plan:   Hypertension Controlled with current medications Monitor blood pressure at home; patient to call if consistently greater than 130/80 Continue DASH diet.   Reminder to go to the ER if any CP, SOB, nausea, dizziness, severe HA, changes vision/speech, left arm numbness and tingling and jaw pain.  Cholesterol Currently managed by lifestyle without medication, was above goal of LDL <70 *** Continue low cholesterol diet and exercise.  Check lipid panel.   Diabetes without complications Continue medication: metformin Continue diet and exercise.  Perform daily foot/skin check, notify office of any concerning changes.  Check A1C  Morbid Obesity with co morbidities Long discussion about weight loss, diet, and exercise Recommended diet heavy in fruits and veggies and low in animal meats, cheeses, and dairy products, appropriate calorie intake Discussed ideal weight for height -  Patient will - continue exercise, low carb, watch portions, increase fluids, start weighing once weekly Will follow up in 3 months  Vitamin D Def/ osteoporosis prevention Continue supplementation; at goal Defer Vit D level   Continue diet and meds as discussed. Further disposition pending results of labs. Discussed med's effects and SE's.   Over 30 minutes of exam, counseling, chart review, and critical decision making was performed.   Future Appointments  Date Time Provider Department Center  10/17/2018  2:30 PM Judd Gaudierorbett, Brennen Gardiner, NP [facility] None  07/18/2019  3:00 PM Judd Gaudierorbett, Braidon Chermak, NP [facility] None    ----------------------------------------------------------------------------------------------------------------------  HPI 57 y.o. female  presents for 3 month follow up on hypertension, cholesterol, T2DM, morbid obesity and vitamin D deficiency.   *** carpal tunnel? ***  She has hx of  right hip/back pain, and bilateral knee pain, seeing Dr. Magnus IvanBlackman, but reports  symptoms improved with daily walking. Takes gabapentin PRN pain.   BMI is There is no height or weight on file to calculate BMI., she has been walking 2 miles/day (40 min). She has recently started cutting back on carbs and increasing protein.  Wt Readings from Last 3 Encounters:  07/17/18 235 lb (106.6 kg)  04/11/18 245 lb (111.1 kg)  10/18/17 234 lb (106.1 kg)   Her blood pressure has been controlled at home, today their BP is    She does not currently work out.  She denies chest pain, shortness of breath, dizziness.   She is not on cholesterol medication ***. Her cholesterol is not at goal. The cholesterol last visit was:   Lab Results  Component Value Date   CHOL 189 07/17/2018   HDL 89 07/17/2018   LDLCALC 80 07/17/2018   TRIG 100 07/17/2018   CHOLHDL 2.1 07/17/2018    She has not been working on diet and exercise for T2 diabetes on metformin 2000 mg daily, and denies increased appetite, nausea, paresthesia of the feet, polydipsia, polyuria, visual disturbances and vomiting. Doesn't check sugars. Last A1C in the office was:  Lab Results  Component Value Date   HGBA1C 7.2 (H) 07/17/2018   She has non-neoplastic goiter that we are monitoring:     Lab Results  Component Value Date   TSH 0.31 (L) 07/17/2018   Patient is on Vitamin D supplement and at goal at the last check:  Lab Results  Component Value Date   VD25OH 75 07/17/2018      Current Medications:  Current Outpatient Medications on File Prior to Visit  Medication Sig  . aspirin EC 81 MG tablet Take 81 mg by mouth daily.    . B Complex-C (SUPER B  COMPLEX PO) Take by mouth daily.  . bisoprolol-hydrochlorothiazide (ZIAC) 10-6.25 MG tablet TAKE 1 TABLET BY MOUTH EVERY DAY  . Cholecalciferol (VITAMIN D PO) Take 5,000 Int'l Units by mouth 3 (three) times a week.   . cloNIDine (CATAPRES) 0.2 MG tablet TAKE 1 TABLET TWICE A DAY  . diclofenac sodium (VOLTAREN) 1 % GEL Apply 2 g topically 4 (four) times daily.  .  furosemide (LASIX) 40 MG tablet TAKE 1 TABLET BY MOUTH TWICE A DAY FOR BLOOD PRESSURE & FLUID RETENTION  . gabapentin (NEURONTIN) 100 MG capsule TAKE 1 CAPSULE 2 TO 3 TIMES A DAY.  . hydrALAZINE (APRESOLINE) 25 MG tablet TAKE 1 TABLET (25 MG TOTAL) BY MOUTH 3 (THREE) TIMES DAILY. NO REFILLS UNTIL SEEN CALL FOR APPT  . losartan (COZAAR) 100 MG tablet TAKE 1 TABLET BY MOUTH EVERY DAY  . Magnesium 250 MG TABS Take 250 mg by mouth daily.  . metFORMIN (GLUCOPHAGE-XR) 500 MG 24 hr tablet TAKE 2 TABLETS BY MOUTH TWICE A DAY  . Multiple Vitamins-Minerals (MULTIVITAMIN PO) Take by mouth daily.  . Vitamin D, Ergocalciferol, (DRISDOL) 50000 units CAPS capsule TAKE ONE CAPSULE BY MOUTH TWICE A WEEK   No current facility-administered medications on file prior to visit.      Allergies:  Allergies  Allergen Reactions  . Amlodipine Swelling  . Naproxen Hives  . Meloxicam & Diet Manage Prod Rash     Medical History:  Past Medical History:  Diagnosis Date  . Allergy   . Diabetes mellitus   . Goiter   . Hyperlipidemia   . Hypertension   . Morbid obesity (HCC) 12/24/2013  . OSA (obstructive sleep apnea)   . Palpitations 08/01/2016  . Sarcoid   . Sarcoidosis   . Vitamin D deficiency    Family history- Reviewed and unchanged Social history- Reviewed and unchanged   Review of Systems:  Review of Systems  Constitutional: Negative for malaise/fatigue and weight loss.  HENT: Negative for hearing loss and tinnitus.   Eyes: Negative for blurred vision and double vision.  Respiratory: Negative for cough, shortness of breath and wheezing.   Cardiovascular: Negative for chest pain, palpitations, orthopnea, claudication and leg swelling.  Gastrointestinal: Negative for abdominal pain, blood in stool, constipation, diarrhea, heartburn, melena, nausea and vomiting.  Genitourinary: Negative.   Musculoskeletal: Negative for back pain, joint pain and myalgias.  Skin: Negative for rash.  Neurological:  Negative for dizziness, tingling, sensory change, weakness and headaches.  Endo/Heme/Allergies: Negative for polydipsia.  Psychiatric/Behavioral: Negative.   All other systems reviewed and are negative.   Physical Exam: There were no vitals taken for this visit. Wt Readings from Last 3 Encounters:  07/17/18 235 lb (106.6 kg)  04/11/18 245 lb (111.1 kg)  10/18/17 234 lb (106.1 kg)   General Appearance: Well nourished, in no apparent distress. Eyes: PERRLA, EOMs, conjunctiva no swelling or erythema Sinuses: No Frontal/maxillary tenderness ENT/Mouth: Ext aud canals clear, TMs without erythema, bulging. No erythema, swelling, or exudate on post pharynx.  Tonsils not swollen or erythematous. Hearing normal.  Neck: Supple, thyroid normal.  Respiratory: Respiratory effort normal, BS equal bilaterally without rales, rhonchi, wheezing or stridor.  Cardio: RRR with no MRGs. Brisk peripheral pulses without edema.  Abdomen: Soft, + BS.  Non tender, no guarding, rebound, hernias, masses. Lymphatics: Non tender without lymphadenopathy.  Musculoskeletal: Full ROM, 5/5 strength, Normal gait.  Skin: Warm, dry without rashes, lesions, ecchymosis.  Neuro: Cranial nerves intact. No cerebellar symptoms.  Psych: Awake and oriented X  3, normal affect, Insight and Judgment appropriate.    Dan Maker, NP 10:51 AM [facility]

## 2018-10-17 ENCOUNTER — Ambulatory Visit: Payer: Self-pay | Admitting: Adult Health

## 2018-11-16 ENCOUNTER — Other Ambulatory Visit: Payer: Self-pay | Admitting: Internal Medicine

## 2018-11-16 DIAGNOSIS — M545 Low back pain: Secondary | ICD-10-CM

## 2018-11-16 DIAGNOSIS — G8929 Other chronic pain: Secondary | ICD-10-CM

## 2018-11-16 DIAGNOSIS — G5711 Meralgia paresthetica, right lower limb: Secondary | ICD-10-CM

## 2018-11-28 ENCOUNTER — Other Ambulatory Visit: Payer: Self-pay | Admitting: Internal Medicine

## 2018-11-29 NOTE — Progress Notes (Deleted)
FOLLOW UP  Assessment and Plan:   Hypertension Controlled with current medications Monitor blood pressure at home; patient to call if consistently greater than 130/80 Continue DASH diet.   Reminder to go to the ER if any CP, SOB, nausea, dizziness, severe HA, changes vision/speech, left arm numbness and tingling and jaw pain.  Cholesterol Fairly managed by lifestyle modificaiton though not quite at goal of LDL <70; statin *** Continue low cholesterol diet and exercise.  Check lipid panel.   Diabetes without complications Continue medication: metformin Continue diet and exercise.  Perform daily foot/skin check, notify office of any concerning changes.  Check A1C  Morbid Obesity with co morbidities Long discussion about weight loss, diet, and exercise Recommended diet heavy in fruits and veggies and low in animal meats, cheeses, and dairy products, appropriate calorie intake Discussed ideal weight for height -  Patient will - continue exercise, low carb, watch portions, increase fluids, start weighing once weekly Will follow up in 3 months  Vitamin D Def/ osteoporosis prevention Continue supplementation; at goal Defer Vit D level  Goiter Monitor Check TSH ***  Left shoulder pain *** Intermittent, mild, non-specific Avoid sleeping on left side, take NSAID regularly for 1-2 weeks Follow up if not improving   Continue diet and meds as discussed. Further disposition pending results of labs. Discussed med's effects and SE's.   Over 30 minutes of exam, counseling, chart review, and critical decision making was performed.   Future Appointments  Date Time Provider Department Center  11/30/2018  4:30 PM Judd GaudierCorbett, Bion Todorov, NP [facility] None  07/18/2019  3:00 PM Judd Gaudierorbett, Greysin Medlen, NP [facility] None    ----------------------------------------------------------------------------------------------------------------------  HPI 58 y.o. female  presents for 3 month follow up on  hypertension, cholesterol, T2DM, morbid obesity and vitamin D deficiency.   She reports left hand tingling much improved with brace at night; today she reports intermittent mild left posterior shoulder pain, worse after waking up in the AM. Non-radiating, not aggravated by any notable movements. Not reproducible in office. ***  R sided sciatica ***  Review dx and resolve ***  *** gabapentin ***  BMI is There is no height or weight on file to calculate BMI., she has been walking 2 miles/day (40 min). She has recently started cutting back on carbs and increasing protein.  Wt Readings from Last 3 Encounters:  07/17/18 235 lb (106.6 kg)  04/11/18 245 lb (111.1 kg)  10/18/17 234 lb (106.1 kg)   Her blood pressure has been controlled at home, today their BP is    She does not currently work out.  She denies chest pain, shortness of breath, dizziness.   She is not on cholesterol medication. Her cholesterol is not at goal of LDL <70; The cholesterol last visit was:   Lab Results  Component Value Date   CHOL 189 07/17/2018   HDL 89 07/17/2018   LDLCALC 80 07/17/2018   TRIG 100 07/17/2018   CHOLHDL 2.1 07/17/2018    She has not been working on diet and exercise for T2 diabetes on metformin 500 mg x ***, she is on ASA, ARB, she is not on statin and denies increased appetite, nausea, paresthesia of the feet, polydipsia, polyuria, visual disturbances and vomiting. Doesn't check sugars, taking 4 tabs of metformin daily. Last A1C in the office was:  Lab Results  Component Value Date   HGBA1C 7.2 (H) 07/17/2018   She has non-neoplastic goiter that we are monitoring, biopsy neg in 2012, no follow up imaging since then-  TSH was mildly low at last check:     Lab Results  Component Value Date   TSH 0.31 (L) 07/17/2018   Patient is on Vitamin D supplement and at goal at the last check:  Lab Results  Component Value Date   VD25OH 75 07/17/2018      Current Medications:  Current Outpatient  Medications on File Prior to Visit  Medication Sig  . aspirin EC 81 MG tablet Take 81 mg by mouth daily.    . B Complex-C (SUPER B COMPLEX PO) Take by mouth daily.  . bisoprolol-hydrochlorothiazide (ZIAC) 10-6.25 MG tablet TAKE 1 TABLET BY MOUTH EVERY DAY  . Cholecalciferol (VITAMIN D PO) Take 5,000 Int'l Units by mouth 3 (three) times a week.   . cloNIDine (CATAPRES) 0.2 MG tablet TAKE 1 TABLET TWICE A DAY  . diclofenac sodium (VOLTAREN) 1 % GEL Apply 2 g topically 4 (four) times daily.  . furosemide (LASIX) 40 MG tablet TAKE 1 TABLET BY MOUTH TWICE A DAY FOR BLOOD PRESSURE & FLUID RETENTION  . gabapentin (NEURONTIN) 100 MG capsule TAKE 1 CAPSULE 2 TO 3 TIMES A DAY.  . hydrALAZINE (APRESOLINE) 25 MG tablet TAKE 1 TABLET (25 MG TOTAL) BY MOUTH 3 (THREE) TIMES DAILY. NO REFILLS UNTIL SEEN CALL FOR APPT  . losartan (COZAAR) 100 MG tablet TAKE 1 TABLET BY MOUTH EVERY DAY  . Magnesium 250 MG TABS Take 250 mg by mouth daily.  . metFORMIN (GLUCOPHAGE-XR) 500 MG 24 hr tablet Take 2 tablets 2 x /day with Meals for Diabetes  . Multiple Vitamins-Minerals (MULTIVITAMIN PO) Take by mouth daily.  . Vitamin D, Ergocalciferol, (DRISDOL) 50000 units CAPS capsule TAKE ONE CAPSULE BY MOUTH TWICE A WEEK   No current facility-administered medications on file prior to visit.      Allergies:  Allergies  Allergen Reactions  . Amlodipine Swelling  . Naproxen Hives  . Meloxicam & Diet Manage Prod Rash     Medical History:  Past Medical History:  Diagnosis Date  . Allergy   . Diabetes mellitus   . Goiter   . Hyperlipidemia   . Hypertension   . Morbid obesity (HCC) 12/24/2013  . OSA (obstructive sleep apnea)   . Palpitations 08/01/2016  . Sarcoid   . Sarcoidosis   . Vitamin D deficiency    Family history- Reviewed and unchanged Social history- Reviewed and unchanged   Review of Systems:  Review of Systems  Constitutional: Negative for malaise/fatigue and weight loss.  HENT: Negative for hearing  loss and tinnitus.   Eyes: Negative for blurred vision and double vision.  Respiratory: Negative for cough, shortness of breath and wheezing.   Cardiovascular: Negative for chest pain, palpitations, orthopnea, claudication and leg swelling.  Gastrointestinal: Negative for abdominal pain, blood in stool, constipation, diarrhea, heartburn, melena, nausea and vomiting.  Genitourinary: Negative.   Musculoskeletal: Positive for back pain (Intermittent R side - significantly improved). Negative for joint pain and myalgias.  Skin: Negative for rash.  Neurological: Positive for tingling (Left hand intermittently). Negative for dizziness, sensory change, weakness and headaches.  Endo/Heme/Allergies: Negative for polydipsia.  Psychiatric/Behavioral: Negative.   All other systems reviewed and are negative.   Physical Exam: There were no vitals taken for this visit. Wt Readings from Last 3 Encounters:  07/17/18 235 lb (106.6 kg)  04/11/18 245 lb (111.1 kg)  10/18/17 234 lb (106.1 kg)   General Appearance: Well nourished, in no apparent distress. Eyes: PERRLA, EOMs, conjunctiva no swelling or erythema Sinuses: No  Frontal/maxillary tenderness ENT/Mouth: Ext aud canals clear, TMs without erythema, bulging. No erythema, swelling, or exudate on post pharynx.  Tonsils not swollen or erythematous. Hearing normal.  Neck: Supple, thyroid normal.  Respiratory: Respiratory effort normal, BS equal bilaterally without rales, rhonchi, wheezing or stridor.  Cardio: RRR with no MRGs. Brisk peripheral pulses without edema.  Abdomen: Soft, + BS.  Non tender, no guarding, rebound, hernias, masses. Lymphatics: Non tender without lymphadenopathy.  Musculoskeletal: Full ROM, 5/5 strength, Normal gait. No joint effusion or palpable abnormality to left shoulder.  Skin: Warm, dry without rashes, lesions, ecchymosis.  Neuro: Cranial nerves intact. No cerebellar symptoms.  Psych: Awake and oriented X 3, normal affect,  Insight and Judgment appropriate.    Dan Maker, NP 2:00 PM [facility]

## 2018-11-30 ENCOUNTER — Ambulatory Visit: Payer: Self-pay | Admitting: Adult Health

## 2018-11-30 ENCOUNTER — Encounter: Payer: Self-pay | Admitting: Adult Health

## 2018-11-30 ENCOUNTER — Ambulatory Visit: Payer: BLUE CROSS/BLUE SHIELD | Admitting: Adult Health

## 2018-11-30 VITALS — BP 150/78 | HR 60 | Temp 97.9°F | Ht 64.5 in | Wt 236.0 lb

## 2018-11-30 DIAGNOSIS — Z79899 Other long term (current) drug therapy: Secondary | ICD-10-CM | POA: Diagnosis not present

## 2018-11-30 DIAGNOSIS — E785 Hyperlipidemia, unspecified: Secondary | ICD-10-CM

## 2018-11-30 DIAGNOSIS — I10 Essential (primary) hypertension: Secondary | ICD-10-CM | POA: Diagnosis not present

## 2018-11-30 DIAGNOSIS — E119 Type 2 diabetes mellitus without complications: Secondary | ICD-10-CM | POA: Diagnosis not present

## 2018-11-30 DIAGNOSIS — E1169 Type 2 diabetes mellitus with other specified complication: Secondary | ICD-10-CM

## 2018-11-30 DIAGNOSIS — E049 Nontoxic goiter, unspecified: Secondary | ICD-10-CM | POA: Diagnosis not present

## 2018-11-30 DIAGNOSIS — E559 Vitamin D deficiency, unspecified: Secondary | ICD-10-CM

## 2018-11-30 MED ORDER — OLMESARTAN MEDOXOMIL 40 MG PO TABS: 40.0000 mg | ORAL_TABLET | Freq: Every day | ORAL | 1 refills | Status: DC

## 2018-11-30 NOTE — Patient Instructions (Addendum)
Goals    . Blood Pressure < 130/80    . HEMOGLOBIN A1C < 7.0    . LDL CALC < 70    . Weight (lb) < 225 lb (102.1 kg)       Aim for 7+ servings of fruits and vegetables daily  65-80+ fluid ounces of water or unsweet tea for healthy kidneys  Limit to max 1 drink of alcohol per day; avoid smoking/tobacco  Limit animal fats in diet for cholesterol and heart health - choose grass fed whenever available  Avoid highly processed foods, and foods high in saturated/trans fats  Aim for low stress - take time to unwind and care for your mental health  Aim for 150 min of moderate intensity exercise weekly for heart health, and weights twice weekly for bone health  Aim for 7-9 hours of sleep daily    Drink 1/2 your body weight in fluid ounces of water daily; drink a tall glass of water 30 min before meals  Don't eat until you're stuffed- listen to your stomach and eat until you are 80% full   Try eating off of a salad plate; wait 10 min after finishing before going back for seconds  Start by eating the vegetables on your plate; aim for 16%50% of your meals to be fruits or vegetables  Then eat your protein - lean meats (grass fed if possible), fish, beans, nuts in moderation  Eat your carbs/starch last ONLY if you still are hungry. If you can, stop before finishing it all  Avoid sugar and flour - the closer it looks to it's original form in nature, typically the better it is for you  Splurge in moderation - "assign" days when you get to splurge and have the "bad stuff" - I like to follow a 80% - 20% plan- "good" choices 80 % of the time, "bad" choices in moderation 20% of the time  Simple equation is: Calories out > calories in = weight loss - even if you eat the bad stuff, if you limit portions, you will still lose weight     Goiter  A goiter is an enlarged thyroid gland. The thyroid is located in the lower front of the neck. It makes hormones that affect many body parts and systems,  including the system that affects how quickly the body burns fuel for energy (metabolism). Most goiters are painless and are not a cause for concern. Some goiters can affect the way your thyroid makes thyroid hormones. Goiters and conditions that cause goiters can be treated, if necessary. What are the causes? Common causes of this condition include:  Lack (deficiency) of a mineral called iodine. The thyroid gland uses iodine to make thyroid hormones.  Diseases that attack healthy cells in the body (autoimmune diseases) and affect thyroid function, such as Graves' disease or Hashimoto's disease. These diseases may cause the body to produce too much thyroid hormone (hyperthyroidism) or too little of the hormone (hypothyroidism).  Conditions that cause inflammation of the thyroid (thyroiditis).  One or more small growths on the thyroid (nodular goiter). Other causes include:  Medical problems caused by abnormal genes that are passed from parent to child (genetic defects).  Thyroid injury or infection.  Tumors that may or may not be cancerous.  Pregnancy.  Certain medicines.  Exposure to radiation. In some cases, the cause may not be known. What increases the risk? This condition is more likely to develop in:  People who do not get enough iodine in  their diet.  People who have a family history of goiter.  Women.  People who are older than age 76.  People who smoke tobacco.  People who have had exposure to radiation. What are the signs or symptoms? The main symptom of this condition is swelling in the lower, front part of the neck. This swelling can range from a very small bump to a large lump. Other symptoms may include:  A tight feeling in the throat.  A hoarse voice.  Coughing.  Wheezing.  Difficulty swallowing or breathing.  Bulging veins in the neck.  Dizziness. When a goiter is the result of an overactive thyroid (hyperthyroidism), symptoms may also  include:  Nervousness or restlessness.  Inability to tolerate heat.  Unexplained weight loss.  Diarrhea.  Change in the texture of hair or skin.  Changes in heartbeat, such as skipped beats, extra beats, or a rapid heart rate.  Loss of menstruation.  Shaky hands.  Increased appetite.  Sleep problems. When a goiter is the result of an underactive thyroid (hypothyroidism), symptoms may also include:  Feeling like you have no energy (lethargy).  Inability to tolerate cold.  Weight gain that is not explained by a change in diet or exercise habits.  Dry skin.  Coarse hair.  Irregular menstrual periods.  Constipation.  Sadness or depression.  Fatigue. In some cases, there may not be any symptoms and the thyroid hormone levels may be normal. How is this diagnosed? This condition may be diagnosed based on your symptoms, your medical history, and a physical exam. You may have tests, such as:  Blood tests to check thyroid function.  Imaging tests, such as: ? Ultrasound. ? CT scan. ? MRI. ? Thyroid scan.  Removal of a tissue sample (biopsy) of the goiter or any nodules. The sample will be tested to check for cancer. How is this treated? Treatment for this condition depends on the cause and your symptoms. Treatment may include:  Medicines to regulate thyroid hormone levels.  Anti-inflammatory medicines or steroid medicines, if the goiter is caused by inflammation.  Iodine supplements or changes to your diet, if the goiter is caused by iodine deficiency.  Radioactive iodine treatment.  Surgery to remove your thyroid. In some cases, you may only need regular check-ups with your health care provider to monitor your condition, and you may not need treatment. Follow these instructions at home:  Follow instructions from your health care provider about any changes to your diet.  Take over-the-counter and prescription medicines only as told by your health care  provider. These include supplements.  Do not use any products that contain nicotine or tobacco, such as cigarettes and e-cigarettes. If you need help quitting, ask your health care provider.  Keep all follow-up visits as told by your health care provider. This is important. Contact a health care provider if:  Your symptoms do not get better with treatment.  You have nausea, vomiting, or diarrhea. Get help right away if:  You have sudden, unexplained confusion or other mental changes.  You have a fever.  You have chest pain.  You have trouble breathing or swallowing.  You suddenly become very weak.  You experience extreme restlessness.  You feel your heart racing. Summary  A goiter is an enlarged thyroid gland.  The thyroid gland is located in the lower front of the neck. It makes hormones that affect many body parts and systems, including the system that affects how quickly the body burns fuel for energy (metabolism).  The main symptom of this condition is swelling in the lower, front part of the neck. This swelling can range from a very small bump to a large lump.  Treatment for this condition depends on the cause and your symptoms. You may need medicines, supplements, or regular monitoring of your condition. This information is not intended to replace advice given to you by your health care provider. Make sure you discuss any questions you have with your health care provider. Document Released: 04/07/2010 Document Revised: 07/14/2017 Document Reviewed: 07/14/2017 Elsevier Interactive Patient Education  [employer]2019 [employer] Inc[employer]

## 2018-11-30 NOTE — Progress Notes (Signed)
FOLLOW UP  Assessment and Plan:   Hypertension Elevated today; she is not checking at home, monitor more closely Stop losartan, start olmesartan 40 mg daily, continue other medicaitons Monitor blood pressure at home; patient to call if consistently greater than 130/80 Continue DASH diet.   Reminder to go to the ER if any CP, SOB, nausea, dizziness, severe HA, changes vision/speech, left arm numbness and tingling and jaw pain.  Cholesterol Well managed by lifestyle without medication Continue low cholesterol diet and exercise.  Check lipid panel.   Diabetes without complications Continue medication: metformin Continue diet and exercise.  Perform daily foot/skin check, notify office of any concerning changes.  Check A1C  Morbid Obesity with co morbidities Long discussion about weight loss, diet, and exercise Recommended diet heavy in fruits and veggies and low in animal meats, cheeses, and dairy products, appropriate calorie intake Discussed ideal weight for height -  Patient will - continue exercise, low carb, watch portions, increase fluids, start weighing once weekly Will follow up in 3 months  Goiter Check TSH, get follow up US, consider uptake scan if changing nodules Monitor   Vitamin D Def/ osteoporosis prevention Continue supplementation; at goal Defer Vit D level    Continue diet and meds as discussed. Further disposition pending results of labs. Discussed med's effects and SE's.   Over 30 minutes of exam, counseling, chart review, and critical decision making was performed.   Future Appointments  Date Time Provider Department Center  07/18/2019  3:00 PM Judd Gaudier, NP [facility] None    ----------------------------------------------------------------------------------------------------------------------  HPI 58 y.o. female  presents for 3 month follow up on hypertension, cholesterol, T2DM, morbid obesity and vitamin D deficiency.   She reports left  hand tingling much improved with brace at night; she reports intermittent mild left posterior shoulder pain, worse after waking up in the AM. Non-radiating, not aggravated by any notable movements. Not reproducible in office. She reports pain feels like "grinding," 7/10, intermittent worse when she sleeps on that side when she wakes up in the AM. She reports heat (taking hot shower helps), has been taking ibuprofen 200 mg as needed which does help. She declines any prescriptions today, she plans to follow up with Dr. Magnus Ivan.   BMI is Body mass index is 39.88 kg/m., she has been walking 2 miles/day (40 min). She has recently started cutting back on carbs and increasing protein. She has been pushing water, aiming for 65 fluid ounces daily. She is avoiding fried foods and cutting down on red meat and carbs, pushing veggies.  Wt Readings from Last 3 Encounters:  11/30/18 236 lb (107 kg)  07/17/18 235 lb (106.6 kg)  04/11/18 245 lb (111.1 kg)   Her blood pressure has been controlled at home, today their BP is BP: (!) 150/78  She does currently work out.  She denies chest pain, shortness of breath, dizziness.   She is not on cholesterol medication. Her cholesterol is at goal. The cholesterol last visit was:   Lab Results  Component Value Date   CHOL 189 07/17/2018   HDL 89 07/17/2018   LDLCALC 80 07/17/2018   TRIG 100 07/17/2018   CHOLHDL 2.1 07/17/2018    She has not been working on diet and exercise for T2 diabetes on metformin 1000 mg BID, and denies increased appetite, nausea, paresthesia of the feet, polydipsia, polyuria, visual disturbances and vomiting. Doesn't check sugars, has equipment if needed. Last A1C in the office was:  Lab Results  Component Value Date  HGBA1C 7.2 (H) 07/17/2018   She has non-neoplastic goiter (neg biopsy 2012) that we are monitoring with new mildly low TSH noted at last visit:     Lab Results  Component Value Date   TSH 0.31 (L) 07/17/2018   Patient is on  Vitamin D supplement and at goal at the last check:  Lab Results  Component Value Date   VD25OH 75 07/17/2018      Current Medications:  Current Outpatient Medications on File Prior to Visit  Medication Sig  . aspirin EC 81 MG tablet Take 81 mg by mouth daily.    . B Complex-C (SUPER B COMPLEX PO) Take by mouth daily.  . bisoprolol-hydrochlorothiazide (ZIAC) 10-6.25 MG tablet TAKE 1 TABLET BY MOUTH EVERY DAY  . Cholecalciferol (VITAMIN D PO) Take 5,000 Int'l Units by mouth 3 (three) times a week.   . cloNIDine (CATAPRES) 0.2 MG tablet TAKE 1 TABLET TWICE A DAY  . diclofenac sodium (VOLTAREN) 1 % GEL Apply 2 g topically 4 (four) times daily.  . furosemide (LASIX) 40 MG tablet TAKE 1 TABLET BY MOUTH TWICE A DAY FOR BLOOD PRESSURE & FLUID RETENTION  . gabapentin (NEURONTIN) 100 MG capsule TAKE 1 CAPSULE 2 TO 3 TIMES A DAY.  . hydrALAZINE (APRESOLINE) 25 MG tablet TAKE 1 TABLET (25 MG TOTAL) BY MOUTH 3 (THREE) TIMES DAILY. NO REFILLS UNTIL SEEN CALL FOR APPT  . Magnesium 250 MG TABS Take 250 mg by mouth daily.  . metFORMIN (GLUCOPHAGE-XR) 500 MG 24 hr tablet Take 2 tablets 2 x /day with Meals for Diabetes  . Multiple Vitamins-Minerals (MULTIVITAMIN PO) Take by mouth daily.  . Vitamin D, Ergocalciferol, (DRISDOL) 50000 units CAPS capsule TAKE ONE CAPSULE BY MOUTH TWICE A WEEK   No current facility-administered medications on file prior to visit.      Allergies:  Allergies  Allergen Reactions  . Amlodipine Swelling  . Naproxen Hives  . Meloxicam & Diet Manage Prod Rash     Medical History:  Past Medical History:  Diagnosis Date  . Allergy   . Diabetes mellitus   . Goiter   . Hyperlipidemia   . Hypertension   . Morbid obesity (HCC) 12/24/2013  . OSA (obstructive sleep apnea)   . Palpitations 08/01/2016  . Sarcoid   . Sarcoidosis   . Vitamin D deficiency    Family history- Reviewed and unchanged Social history- Reviewed and unchanged   Review of Systems:  Review of  Systems  Constitutional: Negative for malaise/fatigue and weight loss.  HENT: Negative for hearing loss and tinnitus.   Eyes: Negative for blurred vision and double vision.  Respiratory: Negative for cough, shortness of breath and wheezing.   Cardiovascular: Negative for chest pain, palpitations, orthopnea, claudication and leg swelling.  Gastrointestinal: Negative for abdominal pain, blood in stool, constipation, diarrhea, heartburn, melena, nausea and vomiting.  Genitourinary: Negative.   Musculoskeletal: Positive for joint pain (L shoulder intermittent pain). Negative for back pain and myalgias.  Skin: Negative for rash.  Neurological: Negative for dizziness, tingling, sensory change, weakness and headaches.  Endo/Heme/Allergies: Negative for polydipsia.  Psychiatric/Behavioral: Negative.   All other systems reviewed and are negative.   Physical Exam: BP (!) 150/78   Pulse 60   Temp 97.9 F (36.6 C)   Ht 5' 4.5" (1.638 m)   Wt 236 lb (107 kg)   SpO2 98%   BMI 39.88 kg/m  Wt Readings from Last 3 Encounters:  11/30/18 236 lb (107 kg)  07/17/18 235 lb (  106.6 kg)  04/11/18 245 lb (111.1 kg)   General Appearance: Well nourished, in no apparent distress. Eyes: PERRLA, EOMs, conjunctiva no swelling or erythema Sinuses: No Frontal/maxillary tenderness ENT/Mouth: Ext aud canals clear, TMs without erythema, bulging. No erythema, swelling, or exudate on post pharynx.  Tonsils not swollen or erythematous. Hearing normal.  Neck: Supple, thyroid normal.  Respiratory: Respiratory effort normal, BS equal bilaterally without rales, rhonchi, wheezing or stridor.  Cardio: RRR with no MRGs. Brisk peripheral pulses without edema.  Abdomen: Soft, + BS.  Non tender, no guarding, rebound, hernias, masses. Lymphatics: Non tender without lymphadenopathy.  Musculoskeletal: Full ROM, 5/5 strength, Normal gait. L shoulder with full ROM intact, some pain with lateral abduction, crepitus anteriorly with  ROM, no effusion, tenderness Skin: Warm, dry without rashes, lesions, ecchymosis.  Neuro: Cranial nerves intact. No cerebellar symptoms.  Psych: Awake and oriented X 3, normal affect, Insight and Judgment appropriate.    Dan MakerAshley C Kamiyah Kindel, NP 2:57 PM [facility]

## 2018-12-01 LAB — COMPLETE METABOLIC PANEL WITH GFR
AG Ratio: 1.5 (calc) (ref 1.0–2.5)
ALT: 13 U/L (ref 6–29)
AST: 15 U/L (ref 10–35)
Albumin: 4.1 g/dL (ref 3.6–5.1)
Alkaline phosphatase (APISO): 59 U/L (ref 33–130)
BUN: 14 mg/dL (ref 7–25)
CO2: 31 mmol/L (ref 20–32)
Calcium: 9.8 mg/dL (ref 8.6–10.4)
Chloride: 99 mmol/L (ref 98–110)
Creat: 0.5 mg/dL (ref 0.50–1.05)
GFR, Est African American: 124 mL/min/{1.73_m2} (ref >=60)
GFR, Est Non African American: 107 mL/min/{1.73_m2} (ref >=60)
Globulin: 2.7 g/dL (calc) (ref 1.9–3.7)
Glucose, Bld: 112 mg/dL — ABNORMAL HIGH (ref 65–99)
Potassium: 4.3 mmol/L (ref 3.5–5.3)
Sodium: 139 mmol/L (ref 135–146)
Total Bilirubin: 0.3 mg/dL (ref 0.2–1.2)
Total Protein: 6.8 g/dL (ref 6.1–8.1)

## 2018-12-01 LAB — CBC WITH DIFFERENTIAL/PLATELET
Absolute Monocytes: 511 cells/uL (ref 200–950)
Basophils Absolute: 37 cells/uL (ref 0–200)
Basophils Relative: 0.5 %
Eosinophils Absolute: 133 cells/uL (ref 15–500)
Eosinophils Relative: 1.8 %
HCT: 37.5 % (ref 35.0–45.0)
Hemoglobin: 12.3 g/dL (ref 11.7–15.5)
Lymphs Abs: 4233 cells/uL — ABNORMAL HIGH (ref 850–3900)
MCH: 25.8 pg — ABNORMAL LOW (ref 27.0–33.0)
MCHC: 32.8 g/dL (ref 32.0–36.0)
MCV: 78.8 fL — ABNORMAL LOW (ref 80.0–100.0)
MONOS PCT: 6.9 %
MPV: 11.1 fL (ref 7.5–12.5)
Neutro Abs: 2486 cells/uL (ref 1500–7800)
Neutrophils Relative %: 33.6 %
Platelets: 421 10*3/uL — ABNORMAL HIGH (ref 140–400)
RBC: 4.76 10*6/uL (ref 3.80–5.10)
RDW: 13.9 % (ref 11.0–15.0)
TOTAL LYMPHOCYTE: 57.2 %
WBC: 7.4 10*3/uL (ref 3.8–10.8)

## 2018-12-01 LAB — TSH: TSH: 0.35 m[IU]/L — AB (ref 0.40–4.50)

## 2018-12-01 LAB — LIPID PANEL
Cholesterol: 191 mg/dL (ref <200)
HDL: 88 mg/dL (ref >50)
LDL Cholesterol (Calc): 83 mg/dL (calc)
Non-HDL Cholesterol (Calc): 103 mg/dL (calc) (ref <130)
Total CHOL/HDL Ratio: 2.2 (calc) (ref <5.0)
Triglycerides: 105 mg/dL (ref <150)

## 2018-12-01 LAB — HEMOGLOBIN A1C
HEMOGLOBIN A1C: 7 %{Hb} — AB (ref <5.7)
Mean Plasma Glucose: 154 (calc)
eAG (mmol/L): 8.5 (calc)

## 2018-12-01 LAB — MAGNESIUM: Magnesium: 1.8 mg/dL (ref 1.5–2.5)

## 2018-12-06 ENCOUNTER — Ambulatory Visit (INDEPENDENT_AMBULATORY_CARE_PROVIDER_SITE_OTHER): Payer: Self-pay

## 2018-12-06 ENCOUNTER — Other Ambulatory Visit (INDEPENDENT_AMBULATORY_CARE_PROVIDER_SITE_OTHER): Payer: Self-pay

## 2018-12-06 ENCOUNTER — Ambulatory Visit (INDEPENDENT_AMBULATORY_CARE_PROVIDER_SITE_OTHER): Payer: BLUE CROSS/BLUE SHIELD

## 2018-12-06 ENCOUNTER — Ambulatory Visit (INDEPENDENT_AMBULATORY_CARE_PROVIDER_SITE_OTHER): Payer: BLUE CROSS/BLUE SHIELD | Admitting: Physician Assistant

## 2018-12-06 ENCOUNTER — Encounter (INDEPENDENT_AMBULATORY_CARE_PROVIDER_SITE_OTHER): Payer: Self-pay | Admitting: Orthopaedic Surgery

## 2018-12-06 DIAGNOSIS — R200 Anesthesia of skin: Secondary | ICD-10-CM

## 2018-12-06 DIAGNOSIS — M79642 Pain in left hand: Secondary | ICD-10-CM

## 2018-12-06 DIAGNOSIS — M25512 Pain in left shoulder: Secondary | ICD-10-CM

## 2018-12-06 DIAGNOSIS — M542 Cervicalgia: Secondary | ICD-10-CM | POA: Diagnosis not present

## 2018-12-06 DIAGNOSIS — R202 Paresthesia of skin: Secondary | ICD-10-CM

## 2018-12-06 MED ORDER — LIDOCAINE HCL 1 % IJ SOLN
3.0000 mL | INTRAMUSCULAR | Status: AC | PRN
  Administered 2018-12-06: 3 mL

## 2018-12-06 MED ORDER — METHYLPREDNISOLONE ACETATE 40 MG/ML IJ SUSP
40.0000 mg | INTRAMUSCULAR | Status: AC | PRN
  Administered 2018-12-06: 40 mg via INTRA_ARTICULAR

## 2018-12-06 NOTE — Progress Notes (Signed)
Office Visit Note   Patient: Tanya [name]           Date of Birth: 12/08/1960           MRN: [id number] Visit Date: 12/06/2018              Requested by: Lucky CowboyMcKeown, William, MD [address] [place], [place] [id number] PCP: Lucky CowboyMcKeown, William, MD   Assessment & Plan: Visit Diagnoses:  1. Neck pain   2. Left shoulder pain, unspecified chronicity   3. Numbness and tingling of left hand     Plan: We will have her undergo EMG /Nerve conduction studies to rule out carpal tunnel syndrome as a source of her numbness tingling in her left hand.  See her back once results are available.  In regards to the shoulder she is given pendulum, Codman wall crawl exercises to perform.  She is to be mindful of how her pain is in the shoulder status post injection today which she tolerated well.  Questions were encouraged and answered at length.  Follow-Up Instructions: Return for After EMG nerve conduction studies..   Orders:  Orders Placed This Encounter  Procedures  . Large Joint Inj: L subacromial bursa  . XR Cervical Spine 2 or 3 views  . XR Shoulder Left   No orders of the defined types were placed in this encounter.     Procedures: Large Joint Inj: L subacromial bursa on 12/06/2018 10:50 AM Indications: pain Details: 22 G 1.5 in needle, lateral approach  Arthrogram: No  Medications: 3 mL lidocaine 1 %; 40 mg methylPREDNISolone acetate 40 MG/ML Outcome: tolerated well, no immediate complications Procedure, treatment alternatives, risks and benefits explained, specific risks discussed. Consent was given by the patient. Immediately prior to procedure a time out was called to verify the correct patient, procedure, equipment, support staff and site/side marked as required. Patient was prepped and draped in the usual sterile fashion.       Clinical Data: No additional findings.   Subjective: Chief Complaint  Patient presents with  . Left Shoulder - Pain  . Neck -  Pain    HPI [name] is well-known to Dr. Magnus IvanBlackman service comes in today with left shoulder pain for approximately the last 3 to 4 months.  Pain and arm pain.  She is having numbness tingling in her fingers also she started 5 1 by this in the past and is using a splint really has not helped much.  States numbness tingling in her fingers are becoming worse.  She is having some neck pain also.  Most of her shoulder pain however is over the anterior aspect of the shoulder and does not radiate down the arm.  She had no known injury.  She is diabetic with last hemoglobin A1c of 7.0. Review of Systems See HPI otherwise negative  Objective: Vital Signs: There were no vitals taken for this visit.  Physical Exam General: Well-developed well-nourished female no acute distress. Psych alert and oriented x3. Ortho Exam Cervical spine full range of motion without pain.  Nontender cervical spine paraspinous region.  Negative Spurling's.  No tenderness over the medial borders of the scapula bilaterally.  Bilateral shoulders she has about a 5 strength external and internal rotation against resistance.  Positive impingement test on the left negative on the right.  Empty can test negative bilaterally.  Liftoff test is negative bilaterally. Bilateral wrist: Radial pulses are 2+ bilaterally and equal symmetric.  Full motor bilateral  hands.  She has full sensation bilateral hands.  Positive Tinel's over the median nerve at the wrist on the left negative on the right.  Positive compression test of the left median nerve at the wrist. Specialty Comments:  No specialty comments available.  Imaging: Xr Cervical Spine 2 Or 3 Views  Result Date: 12/06/2018 Cervical spine AP lateral views: No acute fracture.  This space well maintained.  Osteophytes off the anterior cervical bodies C5-C6.  No spondylolisthesis.  Xr Shoulder Left  Result Date: 12/06/2018 Left shoulder 3 views: Shoulders well located.  No acute  fracture.  Osteophytes at the greater tuberosity and anatomical neck region.  AC joint changes mild to moderate.  Subacromial space well maintained.    PMFS History: Patient Active Problem List   Diagnosis Date Noted  . Carpal tunnel syndrome of left wrist 10/18/2017  . Acute right-sided low back pain with right-sided sciatica 06/01/2017  . Tortuous aorta (HCC) 05/20/2017  . Palpitations 08/01/2016  . Morbid obesity (HCC) 12/24/2013  . Hypertension   . Hyperlipidemia associated with type 2 diabetes mellitus (HCC)   . Diabetes mellitus without complication (HCC)   . Environmental allergies   . OSA (obstructive sleep apnea)   . Goiter   . Sarcoid   . Vitamin D deficiency    Past Medical History:  Diagnosis Date  . Allergy   . Diabetes mellitus   . Goiter   . Hyperlipidemia   . Hypertension   . Morbid obesity (HCC) 12/24/2013  . OSA (obstructive sleep apnea)   . Palpitations 08/01/2016  . Sarcoid   . Sarcoidosis   . Vitamin D deficiency     Family History  Problem Relation Age of Onset  . Hypertension Mother   . Diabetes Father   . Heart disease Father   . Diabetes Paternal Grandmother   . Heart disease Paternal Grandmother   . Hypertension Sister   . Heart disease Sister   . Hypertension Brother   . Colon cancer Neg Hx   . Esophageal cancer Neg Hx   . Rectal cancer Neg Hx   . Stomach cancer Neg Hx   . Breast cancer Neg Hx     Past Surgical History:  Procedure Laterality Date  . ABDOMINAL HYSTERECTOMY    . achiles tendon     right  . EYE SURGERY     biopsy at [facility]  . TONSILLECTOMY     Social History   Occupational History  . Not on file  Tobacco Use  . Smoking status: Never Smoker  . Smokeless tobacco: Never Used  Substance and Sexual Activity  . Alcohol use: No  . Drug use: No  . Sexual activity: Yes    Birth control/protection: Post-menopausal

## 2018-12-07 ENCOUNTER — Ambulatory Visit
Admission: RE | Admit: 2018-12-07 | Discharge: 2018-12-07 | Disposition: A | Discharge status: Home / Self Care | Payer: BLUE CROSS/BLUE SHIELD | Source: Ambulatory Visit | Attending: Adult Health | Admitting: Adult Health

## 2018-12-07 DIAGNOSIS — E049 Nontoxic goiter, unspecified: Secondary | ICD-10-CM

## 2018-12-07 DIAGNOSIS — E041 Nontoxic single thyroid nodule: Secondary | ICD-10-CM | POA: Diagnosis not present

## 2018-12-20 DIAGNOSIS — H16142 Punctate keratitis, left eye: Secondary | ICD-10-CM | POA: Diagnosis not present

## 2018-12-21 ENCOUNTER — Ambulatory Visit (INDEPENDENT_AMBULATORY_CARE_PROVIDER_SITE_OTHER): Payer: BLUE CROSS/BLUE SHIELD | Admitting: Physical Medicine and Rehabilitation

## 2018-12-21 ENCOUNTER — Encounter (INDEPENDENT_AMBULATORY_CARE_PROVIDER_SITE_OTHER): Payer: Self-pay | Admitting: Physical Medicine and Rehabilitation

## 2018-12-21 DIAGNOSIS — R202 Paresthesia of skin: Secondary | ICD-10-CM

## 2018-12-21 NOTE — Progress Notes (Signed)
  Numeric Pain Rating Scale and Functional Assessment Average Pain 0   In the last MONTH (on 0-10 scale) has pain interfered with the following?  1. General activity like being  able to carry out your everyday physical activities such as walking, climbing stairs, carrying groceries, or moving a chair?  Rating(4)     

## 2018-12-25 ENCOUNTER — Ambulatory Visit (INDEPENDENT_AMBULATORY_CARE_PROVIDER_SITE_OTHER): Payer: BLUE CROSS/BLUE SHIELD | Admitting: Orthopaedic Surgery

## 2018-12-25 ENCOUNTER — Encounter (INDEPENDENT_AMBULATORY_CARE_PROVIDER_SITE_OTHER): Payer: Self-pay | Admitting: Orthopaedic Surgery

## 2018-12-25 DIAGNOSIS — G5602 Carpal tunnel syndrome, left upper limb: Secondary | ICD-10-CM

## 2018-12-25 DIAGNOSIS — M79642 Pain in left hand: Secondary | ICD-10-CM

## 2018-12-25 NOTE — Progress Notes (Signed)
Tanya [name] - 58 y.o. female MRN [id number]  Date of birth: [birth year]  Office Visit Note: Visit Date: 12/21/2018 PCP: Lucky Cowboy, MD Referred by: Lucky Cowboy, MD  Subjective: Chief Complaint  Patient presents with  . Left Forearm - Numbness, Tingling  . Left Wrist - Numbness, Tingling  . Left Hand - Numbness, Tingling   HPI: Tanya [name] is a 58 y.o. female who comes in today At the request of Dr. Doneen Poisson for electrodiagnostic study of the left upper limb.  Patient is right-hand dominant but reports chronic worsening left hand numbness and tingling particularly in the forearm and wrist as well as index middle ring finger and sometimes fifth digit.  When pressed she really states the radial digits are more problematic than the ulnar digits.  This is been ongoing for about 6 months.  She reports worsening if she holds something for very long she also reports nocturnal complaints.  She has been wearing a brace which is helped to a degree.  She has not had any frank radicular symptoms.  ROS Otherwise per HPI.  Assessment & Plan: Visit Diagnoses:  1. Paresthesia of skin     Plan: Impression: The above electrodiagnostic study is ABNORMAL and reveals evidence of a moderate left median nerve entrapment at the wrist (carpal tunnel syndrome) affecting sensory and motor components.   There is no significant electrodiagnostic evidence of any other focal nerve entrapment, brachial plexopathy or cervical radiculopathy.   Recommendations: 1.  Follow-up with referring physician. 2.  Continue current management of symptoms. 3.  Continue use of resting splint at night-time and as needed during the day. 4.  Suggest surgical evaluation.    Meds & Orders: No orders of the defined types were placed in this encounter.   Orders Placed This Encounter  Procedures  . NCV with EMG (electromyography)    Follow-up: No follow-ups on file.   Procedures: No procedures  performed  EMG & NCV Findings: Evaluation of the left median motor nerve showed prolonged distal onset latency (4.5 ms) and decreased conduction velocity (Elbow-Wrist, 43 m/s).  The left median (across palm) sensory nerve showed prolonged distal peak latency (Wrist, 5.6 ms) and prolonged distal peak latency (Palm, 4.3 ms).  All remaining nerves (as indicated in the following tables) were within normal limits.    All examined muscles (as indicated in the following table) showed no evidence of electrical instability.    Impression: The above electrodiagnostic study is ABNORMAL and reveals evidence of a moderate left median nerve entrapment at the wrist (carpal tunnel syndrome) affecting sensory and motor components.   There is no significant electrodiagnostic evidence of any other focal nerve entrapment, brachial plexopathy or cervical radiculopathy.   Recommendations: 1.  Follow-up with referring physician. 2.  Continue current management of symptoms. 3.  Continue use of resting splint at night-time and as needed during the day. 4.  Suggest surgical evaluation.  ___________________________ Naaman Plummer FAAPMR Board Certified, American Board of Physical Medicine and Rehabilitation    Nerve Conduction Studies Anti Sensory Summary Table   Stim Site NR Peak (ms) Norm Peak (ms) P-T Amp (V) Norm P-T Amp Site1 Site2 Delta-P (ms) Dist (cm) Vel (m/s) Norm Vel (m/s)  Left Median Acr Palm Anti Sensory (2nd Digit)  32.3C  Wrist    *5.6 <3.6 13.3 >10 Wrist Palm 1.3 0.0    Palm    *4.3 <2.0 4.5         Left Radial Anti Sensory [facility]  1st Digit)  32C  Wrist    2.2 <3.1 46.9  Wrist Base 1st Digit 2.2 0.0    Left Ulnar Anti Sensory (5th Digit)  32.5C  Wrist    3.3 <3.7 30.0 >15.0 Wrist 5th Digit 3.3 14.0 42 >38   Motor Summary Table   Stim Site NR Onset (ms) Norm Onset (ms) O-P Amp (mV) Norm O-P Amp Site1 Site2 Delta-0 (ms) Dist (cm) Vel (m/s) Norm Vel (m/s)  Left Median Motor (Abd Poll Brev)   31.9C  Wrist    *4.5 <4.2 5.6 >5 Elbow Wrist 4.9 21.0 *43 >50  Elbow    9.4  4.6         Left Ulnar Motor (Abd Dig Min)  31.8C  Wrist    3.0 <4.2 6.2 >3 B Elbow Wrist 2.9 19.0 66 >53  B Elbow    5.9  4.2  A Elbow B Elbow 1.5 10.0 67 >53  A Elbow    7.4  5.8          EMG   Side Muscle Nerve Root Ins Act Fibs Psw Amp Dur Poly Recrt Int Dennie Bible Comment  Left Abd Poll Brev Median C8-T1 Nml Nml Nml Nml Nml 0 Nml Nml   Left 1stDorInt Ulnar C8-T1 Nml Nml Nml Nml Nml 0 Nml Nml   Left PronatorTeres Median C6-7 Nml Nml Nml Nml Nml 0 Nml Nml   Left Biceps Musculocut C5-6 Nml Nml Nml Nml Nml 0 Nml Nml   Left Deltoid Axillary C5-6 Nml Nml Nml Nml Nml 0 Nml Nml     Nerve Conduction Studies Anti Sensory Left/Right Comparison   Stim Site L Lat (ms) R Lat (ms) L-R Lat (ms) L Amp (V) R Amp (V) L-R Amp (%) Site1 Site2 L Vel (m/s) R Vel (m/s) L-R Vel (m/s)  Median Acr Palm Anti Sensory (2nd Digit)  32.3C  Wrist *5.6   13.3   Wrist Palm     Palm *4.3   4.5         Radial Anti Sensory (Base 1st Digit)  32C  Wrist 2.2   46.9   Wrist Base 1st Digit     Ulnar Anti Sensory (5th Digit)  32.5C  Wrist 3.3   30.0   Wrist 5th Digit 42     Motor Left/Right Comparison   Stim Site L Lat (ms) R Lat (ms) L-R Lat (ms) L Amp (mV) R Amp (mV) L-R Amp (%) Site1 Site2 L Vel (m/s) R Vel (m/s) L-R Vel (m/s)  Median Motor (Abd Poll Brev)  31.9C  Wrist *4.5   5.6   Elbow Wrist *43    Elbow 9.4   4.6         Ulnar Motor (Abd Dig Min)  31.8C  Wrist 3.0   6.2   B Elbow Wrist 66    B Elbow 5.9   4.2   A Elbow B Elbow 67    A Elbow 7.4   5.8            Waveforms:            Clinical History: No specialty comments available.   She reports that she has never smoked. She has never used smokeless tobacco.  Recent Labs    04/11/18 1715 07/17/18 1443 11/30/18 1454  HGBA1C 7.7* 7.2* 7.0*    Objective:  VS:  HT:    WT:   BMI:     BP:   HR: bpm  TEMP: ( )  RESP:  Physical Exam Musculoskeletal:         General: No swelling, tenderness or deformity.     Comments: Inspection reveals no atrophy of the bilateral APB or FDI or hand intrinsics. There is no swelling, color changes, allodynia or dystrophic changes. There is 5 out of 5 strength in the bilateral wrist extension, finger abduction and long finger flexion. There is intact sensation to light touch in all dermatomal and peripheral nerve distributions.  There is a positive Phalen's test bilaterally. There is a negative Hoffmann's test bilaterally.  Skin:    General: Skin is warm and dry.     Findings: No erythema or rash.  Neurological:     General: No focal deficit present.     Mental Status: She is alert and oriented to person, place, and time.     Motor: No weakness or abnormal muscle tone.     Coordination: Coordination normal.  Psychiatric:        Mood and Affect: Mood normal.        Behavior: Behavior normal.     Ortho Exam Imaging: No results found.  Past Medical/Family/Surgical/Social History: Medications & Allergies reviewed per EMR, new medications updated. Patient Active Problem List   Diagnosis Date Noted  . Carpal tunnel syndrome of left wrist 10/18/2017  . Acute right-sided low back pain with right-sided sciatica 06/01/2017  . Tortuous aorta (HCC) 05/20/2017  . Palpitations 08/01/2016  . Morbid obesity (HCC) 12/24/2013  . Hypertension   . Hyperlipidemia associated with type 2 diabetes mellitus (HCC)   . Diabetes mellitus without complication (HCC)   . Environmental allergies   . OSA (obstructive sleep apnea)   . Goiter   . Sarcoid   . Vitamin D deficiency    Past Medical History:  Diagnosis Date  . Allergy   . Diabetes mellitus   . Goiter   . Hyperlipidemia   . Hypertension   . Morbid obesity (HCC) 12/24/2013  . OSA (obstructive sleep apnea)   . Palpitations 08/01/2016  . Sarcoid   . Sarcoidosis   . Vitamin D deficiency    Family History  Problem Relation Age of Onset  . Hypertension Mother   .  Diabetes Father   . Heart disease Father   . Diabetes Paternal Grandmother   . Heart disease Paternal Grandmother   . Hypertension Sister   . Heart disease Sister   . Hypertension Brother   . Colon cancer Neg Hx   . Esophageal cancer Neg Hx   . Rectal cancer Neg Hx   . Stomach cancer Neg Hx   . Breast cancer Neg Hx    Past Surgical History:  Procedure Laterality Date  . ABDOMINAL HYSTERECTOMY    . achiles tendon     right  . EYE SURGERY     biopsy at [facility]  . TONSILLECTOMY     Social History   Occupational History  . Not on file  Tobacco Use  . Smoking status: Never Smoker  . Smokeless tobacco: Never Used  Substance and Sexual Activity  . Alcohol use: No  . Drug use: No  . Sexual activity: Yes    Birth control/protection: Post-menopausal

## 2018-12-25 NOTE — Progress Notes (Signed)
The patient is here today to go over nerve conduction studies of her left upper extremity.  She was having numbness and tingling and pain.  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  She does wear a night splint now and does report some of her symptoms have been feeling a little better.  She still points to the median nerve distribution as a source of her numbness and tingling and pain in her hand and palm.  On exam there is no evidence of muscle atrophy of her left hand.  She does have subjective decrease sensation in median nerve distribution.  She does have a positive Tinel's and Phalen's exam at the transverse carpal ligament area of her left wrist.  The nerve conduction studies were reviewed with her and it does show moderate entrapment of the median nerve at the transverse carpal ligament.  I talked her about the findings of her study.  I have recommended an open carpal tunnel release but she is not need to rush into this.  I explained in detail with the surgery involves as well as the anatomy the wrist.  This is something I think she should have done sometime in the future.  All question concerns were answered and addressed.  She says she will think about this and let us know.

## 2018-12-25 NOTE — Procedures (Signed)
EMG & NCV Findings: Evaluation of the left median motor nerve showed prolonged distal onset latency (4.5 ms) and decreased conduction velocity (Elbow-Wrist, 43 m/s).  The left median (across palm) sensory nerve showed prolonged distal peak latency (Wrist, 5.6 ms) and prolonged distal peak latency (Palm, 4.3 ms).  All remaining nerves (as indicated in the following tables) were within normal limits.    All examined muscles (as indicated in the following table) showed no evidence of electrical instability.    Impression: The above electrodiagnostic study is ABNORMAL and reveals evidence of a moderate left median nerve entrapment at the wrist (carpal tunnel syndrome) affecting sensory and motor components.   There is no significant electrodiagnostic evidence of any other focal nerve entrapment, brachial plexopathy or cervical radiculopathy.   Recommendations: 1.  Follow-up with referring physician. 2.  Continue current management of symptoms. 3.  Continue use of resting splint at night-time and as needed during the day. 4.  Suggest surgical evaluation.  ___________________________ Naaman Plummer FAAPMR Board Certified, American Board of Physical Medicine and Rehabilitation    Nerve Conduction Studies Anti Sensory Summary Table   Stim Site NR Peak (ms) Norm Peak (ms) P-T Amp (V) Norm P-T Amp Site1 Site2 Delta-P (ms) Dist (cm) Vel (m/s) Norm Vel (m/s)  Left Median Acr Palm Anti Sensory (2nd Digit)  32.3C  Wrist    *5.6 <3.6 13.3 >10 Wrist Palm 1.3 0.0    Palm    *4.3 <2.0 4.5         Left Radial Anti Sensory (Base 1st Digit)  32C  Wrist    2.2 <3.1 46.9  Wrist Base 1st Digit 2.2 0.0    Left Ulnar Anti Sensory (5th Digit)  32.5C  Wrist    3.3 <3.7 30.0 >15.0 Wrist 5th Digit 3.3 14.0 42 >38   Motor Summary Table   Stim Site NR Onset (ms) Norm Onset (ms) O-P Amp (mV) Norm O-P Amp Site1 Site2 Delta-0 (ms) Dist (cm) Vel (m/s) Norm Vel (m/s)  Left Median Motor (Abd Poll Brev)  31.9C    Wrist    *4.5 <4.2 5.6 >5 Elbow Wrist 4.9 21.0 *43 >50  Elbow    9.4  4.6         Left Ulnar Motor (Abd Dig Min)  31.8C  Wrist    3.0 <4.2 6.2 >3 B Elbow Wrist 2.9 19.0 66 >53  B Elbow    5.9  4.2  A Elbow B Elbow 1.5 10.0 67 >53  A Elbow    7.4  5.8          EMG   Side Muscle Nerve Root Ins Act Fibs Psw Amp Dur Poly Recrt Int Dennie Bible Comment  Left Abd Poll Brev Median C8-T1 Nml Nml Nml Nml Nml 0 Nml Nml   Left 1stDorInt Ulnar C8-T1 Nml Nml Nml Nml Nml 0 Nml Nml   Left PronatorTeres Median C6-7 Nml Nml Nml Nml Nml 0 Nml Nml   Left Biceps Musculocut C5-6 Nml Nml Nml Nml Nml 0 Nml Nml   Left Deltoid Axillary C5-6 Nml Nml Nml Nml Nml 0 Nml Nml     Nerve Conduction Studies Anti Sensory Left/Right Comparison   Stim Site L Lat (ms) R Lat (ms) L-R Lat (ms) L Amp (V) R Amp (V) L-R Amp (%) Site1 Site2 L Vel (m/s) R Vel (m/s) L-R Vel (m/s)  Median Acr Palm Anti Sensory (2nd Digit)  32.3C  Wrist *5.6   13.3   Wrist Palm  Palm *4.3   4.5         Radial Anti Sensory (Base 1st Digit)  32C  Wrist 2.2   46.9   Wrist Base 1st Digit     Ulnar Anti Sensory (5th Digit)  32.5C  Wrist 3.3   30.0   Wrist 5th Digit 42     Motor Left/Right Comparison   Stim Site L Lat (ms) R Lat (ms) L-R Lat (ms) L Amp (mV) R Amp (mV) L-R Amp (%) Site1 Site2 L Vel (m/s) R Vel (m/s) L-R Vel (m/s)  Median Motor (Abd Poll Brev)  31.9C  Wrist *4.5   5.6   Elbow Wrist *43    Elbow 9.4   4.6         Ulnar Motor (Abd Dig Min)  31.8C  Wrist 3.0   6.2   B Elbow Wrist 66    B Elbow 5.9   4.2   A Elbow B Elbow 67    A Elbow 7.4   5.8            Waveforms:

## 2019-01-27 ENCOUNTER — Other Ambulatory Visit: Payer: Self-pay | Admitting: Internal Medicine

## 2019-02-02 ENCOUNTER — Other Ambulatory Visit: Payer: Self-pay | Admitting: Adult Health

## 2019-02-13 ENCOUNTER — Other Ambulatory Visit: Payer: Self-pay | Admitting: Internal Medicine

## 2019-02-13 DIAGNOSIS — G8929 Other chronic pain: Secondary | ICD-10-CM

## 2019-02-13 DIAGNOSIS — G5711 Meralgia paresthetica, right lower limb: Secondary | ICD-10-CM

## 2019-02-13 DIAGNOSIS — M545 Low back pain: Secondary | ICD-10-CM

## 2019-02-21 ENCOUNTER — Other Ambulatory Visit: Payer: Self-pay | Admitting: Internal Medicine

## 2019-03-08 NOTE — Progress Notes (Signed)
FOLLOW UP  Assessment and Plan:   Hypertension Borderline elevated today; she is not checking at home, monitor more closely Improved on olmesartan 40 mg daily, continue other medicaitons Monitor blood pressure at home; patient to call if consistently greater than 130/80 Continue DASH diet.   Reminder to go to the ER if any CP, SOB, nausea, dizziness, severe HA, changes vision/speech, left arm numbness and tingling and jaw pain.  Cholesterol Fairly managed by lifestyle without medication though not quite at diabetic goal of LDL <70, have deferred medication per strong patient preference Continue low cholesterol diet and exercise.  Check lipid panel.   Diabetes without complications Continue medication: metformin Continue diet and exercise.  Perform daily foot/skin check, notify office of any concerning changes.  Check A1C  Morbid Obesity with co morbidities Long discussion about weight loss, diet, and exercise Recommended diet heavy in fruits and veggies and low in animal meats, cheeses, and dairy products, appropriate calorie intake Discussed ideal weight for height -  Patient will - continue exercise, low carb, watch portions, increase fluids, start weighing once weekly Will start the patient on phentermine- hand out given and AE's discussed, will do close follow up.  Will follow up in 3 months  Goiter Neg biopsy of nodule in 2012; stable on follow up US 12/2018 Check TSH Monitor   Vitamin D Def/ osteoporosis prevention Continue supplementation; at goal Defer Vit D level    Continue diet and meds as discussed. Further disposition pending results of labs. Discussed med's effects and SE's.   Over 30 minutes of exam, counseling, chart review, and critical decision making was performed.   Future Appointments  Date Time Provider Department Center  07/18/2019  3:00 PM Judd Gaudierorbett, Zhoey Blackstock, NP [facility] None     ----------------------------------------------------------------------------------------------------------------------  HPI 58 y.o. female  presents for 3 month follow up on hypertension, cholesterol, T2DM, morbid obesity and vitamin D deficiency. She has OSA on CPAP.   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  She  left hand carpal tunnel of L; tingling much improved with brace at night; she is followed by Dr. Magnus IvanBlackman for orthopedic concerns, has been recommended will likely require surgery in the future but has been deferring for now.    BMI is Body mass index is 39.21 kg/m., she has been walking 2 miles/day (40 min). She has recently started cutting back on carbs and increasing protein. She has been pushing water, aiming for 65 fluid ounces daily. She is avoiding fried foods and cutting down on red meat and carbs, pushing veggies.  Wt Readings from Last 3 Encounters:  03/12/19 232 lb (105.2 kg)  11/30/18 236 lb (107 kg)  07/17/18 235 lb (106.6 kg)   She admits to not checking BP at home, today their BP is BP: 140/70  She does currently work out.  She denies chest pain, shortness of breath, dizziness.   She is not on cholesterol medication. Her cholesterol is not at goal of LDL <70. The cholesterol last visit was:   Lab Results  Component Value Date   CHOL 191 11/30/2018   HDL 88 11/30/2018   LDLCALC 83 11/30/2018   TRIG 105 11/30/2018   CHOLHDL 2.2 11/30/2018    She has not been working on diet and exercise for T2 diabetes on metformin 1000 mg BID, and denies increased appetite, nausea, paresthesia of the feet, polydipsia, polyuria, visual disturbances and vomiting. Doesn't check sugars, has equipment if needed. Last A1C in the office  was:  Lab Results  Component Value Date   HGBA1C 7.0 (H) 11/30/2018   She has non-neoplastic goiter with R lobe nodule (neg biopsy 2012) that we are monitoring with new mildly low TSH noted at last visit, thyroid  US showed nodule unchanged from previous:     Lab Results  Component Value Date   TSH 0.35 (L) 11/30/2018   Patient is on Vitamin D supplement and at goal at the last check:  Lab Results  Component Value Date   VD25OH 75 07/17/2018      Current Medications:  Current Outpatient Medications on File Prior to Visit  Medication Sig  . aspirin EC 81 MG tablet Take 81 mg by mouth daily.    . B Complex-C (SUPER B COMPLEX PO) Take by mouth daily.  . bisoprolol-hydrochlorothiazide (ZIAC) 10-6.25 MG tablet TAKE 1 TABLET BY MOUTH EVERY DAY  . Cholecalciferol (VITAMIN D PO) Take 5,000 Int'l Units by mouth 2 (two) times a week.   . cloNIDine (CATAPRES) 0.2 MG tablet TAKE 1 TABLET BY MOUTH TWICE A DAY  . diclofenac sodium (VOLTAREN) 1 % GEL Apply 2 g topically 4 (four) times daily. (Patient taking differently: Apply 2 g topically as needed. )  . furosemide (LASIX) 40 MG tablet TAKE 1 TABLET BY MOUTH TWICE A DAY FOR BLOOD PRESSURE & FLUID RETENTION  . gabapentin (NEURONTIN) 100 MG capsule TAKE 1 CAPSULE 2 TO 3 TIMES A DAY.  . hydrALAZINE (APRESOLINE) 25 MG tablet TAKE 1 TABLET (25 MG TOTAL) BY MOUTH 3 (THREE) TIMES DAILY. NO REFILLS UNTIL SEEN CALL FOR APPT  . Magnesium 250 MG TABS Take 250 mg by mouth daily.  . metFORMIN (GLUCOPHAGE-XR) 500 MG 24 hr tablet TAKE 2 TABLETS 2 TIMES A DAY WITH MEALS FOR DIABETES  . Multiple Vitamins-Minerals (MULTIVITAMIN PO) Take by mouth daily.  Marland Kitchen olmesartan (BENICAR) 40 MG tablet Take 1 tablet (40 mg total) by mouth daily.  . Vitamin D, Ergocalciferol, (DRISDOL) 1.25 MG (50000 UT) CAPS capsule TAKE ONE CAPSULE BY MOUTH TWICE WEEKLY   No current facility-administered medications on file prior to visit.      Allergies:  Allergies  Allergen Reactions  . Amlodipine Swelling  . Naproxen Hives  . Meloxicam & Diet Manage Prod Rash     Medical History:  Past Medical History:  Diagnosis Date  . Allergy   . Diabetes mellitus   . Goiter   . Hyperlipidemia   .  Hypertension   . Morbid obesity (HCC) 12/24/2013  . OSA (obstructive sleep apnea)   . Palpitations 08/01/2016  . Sarcoid   . Sarcoidosis   . Vitamin D deficiency    Family history- Reviewed and unchanged Social history- Reviewed and unchanged   Review of Systems:  Review of Systems  Constitutional: Negative for malaise/fatigue and weight loss.  HENT: Negative for hearing loss and tinnitus.   Eyes: Negative for blurred vision and double vision.  Respiratory: Negative for cough, shortness of breath and wheezing.   Cardiovascular: Negative for chest pain, palpitations, orthopnea, claudication and leg swelling.  Gastrointestinal: Negative for abdominal pain, blood in stool, constipation, diarrhea, heartburn, melena, nausea and vomiting.  Genitourinary: Negative.   Musculoskeletal: Negative for back pain, joint pain and myalgias.  Skin: Negative for rash.  Neurological: Negative for dizziness, tingling, sensory change, weakness and headaches.  Endo/Heme/Allergies: Negative for polydipsia.  Psychiatric/Behavioral: Negative.   All other systems reviewed and are negative.   Physical Exam: BP 140/70   Pulse 69   Temp  97.7 F (36.5 C)   Ht 5' 4.5" (1.638 m)   Wt 232 lb (105.2 kg)   SpO2 97%   BMI 39.21 kg/m  Wt Readings from Last 3 Encounters:  03/12/19 232 lb (105.2 kg)  11/30/18 236 lb (107 kg)  07/17/18 235 lb (106.6 kg)   General Appearance: Well nourished, in no apparent distress. Eyes: PERRLA, EOMs, conjunctiva no swelling or erythema Sinuses: No Frontal/maxillary tenderness ENT/Mouth: Ext aud canals clear, TMs without erythema, bulging. No erythema, swelling, or exudate on post pharynx.  Tonsils not swollen or erythematous. Hearing normal.  Neck: Supple, thyroid normal.  Respiratory: Respiratory effort normal, BS equal bilaterally without rales, rhonchi, wheezing or stridor.  Cardio: RRR with no MRGs. Brisk peripheral pulses without edema.  Abdomen: Soft, + BS.  Non  tender, no guarding, rebound, hernias, masses. Lymphatics: Non tender without lymphadenopathy.  Musculoskeletal: Full ROM, 5/5 strength, Normal gait.  Skin: Warm, dry without rashes, lesions, ecchymosis.  Neuro: Cranial nerves intact. No cerebellar symptoms.  Psych: Awake and oriented X 3, normal affect, Insight and Judgment appropriate.    Dan Maker, NP 3:10 PM [facility]

## 2019-03-12 ENCOUNTER — Other Ambulatory Visit: Payer: Self-pay

## 2019-03-12 ENCOUNTER — Encounter: Payer: Self-pay | Admitting: Adult Health

## 2019-03-12 ENCOUNTER — Ambulatory Visit: Payer: BLUE CROSS/BLUE SHIELD | Admitting: Adult Health

## 2019-03-12 VITALS — BP 140/70 | HR 69 | Temp 97.7°F | Ht 64.5 in | Wt 232.0 lb

## 2019-03-12 DIAGNOSIS — E049 Nontoxic goiter, unspecified: Secondary | ICD-10-CM | POA: Diagnosis not present

## 2019-03-12 DIAGNOSIS — I10 Essential (primary) hypertension: Secondary | ICD-10-CM

## 2019-03-12 DIAGNOSIS — E559 Vitamin D deficiency, unspecified: Secondary | ICD-10-CM

## 2019-03-12 DIAGNOSIS — Z79899 Other long term (current) drug therapy: Secondary | ICD-10-CM

## 2019-03-12 DIAGNOSIS — E785 Hyperlipidemia, unspecified: Secondary | ICD-10-CM

## 2019-03-12 DIAGNOSIS — E119 Type 2 diabetes mellitus without complications: Secondary | ICD-10-CM | POA: Diagnosis not present

## 2019-03-12 DIAGNOSIS — E1169 Type 2 diabetes mellitus with other specified complication: Secondary | ICD-10-CM | POA: Diagnosis not present

## 2019-03-12 MED ORDER — PHENTERMINE HCL 37.5 MG PO TABS: ORAL_TABLET | ORAL | 2 refills | Status: DC

## 2019-03-12 NOTE — Patient Instructions (Addendum)
Goals    . Blood Pressure < 130/80    . HEMOGLOBIN A1C < 7.0    . LDL CALC < 70    . Weight (lb) < 225 lb (102.1 kg)      Try to weigh weekly   Check blood pressure 1-2 times per week  Check fasting blood sugar once weekly or a few times a month in the morning         Phentermine tablets or capsules What is this medicine? PHENTERMINE (FEN ter meen) decreases your appetite. It is used with a reduced calorie diet and exercise to help you lose weight. This medicine may be used for other purposes; ask your health care provider or pharmacist if you have questions. COMMON BRAND NAME(S): Adipex-P, Atti-Plex P, Atti-Plex P Spansule, Fastin, Lomaira, Pro-Fast, Tara-8 What should I tell my health care provider before I take this medicine? They need to know if you have any of these conditions: -agitation or nervousness -diabetes -glaucoma -heart disease -high blood pressure -history of drug abuse or addiction -history of stroke -kidney disease -lung disease called Primary Pulmonary Hypertension (PPH) -taken an MAOI like Carbex, Eldepryl, Marplan, Nardil, or Parnate in last 14 days -taking stimulant medicines for attention disorders, weight loss, or to stay awake -thyroid disease -an unusual or allergic reaction to phentermine, other medicines, foods, dyes, or preservatives -pregnant or trying to get pregnant -breast-feeding How should I use this medicine? Take this medicine by mouth with a glass of water. Follow the directions on the prescription label. The instructions for use may differ based on the product and dose you are taking. Avoid taking this medicine in the evening. It may interfere with sleep. Take your doses at regular intervals. Do not take your medicine more often than directed. Talk to your pediatrician regarding the use of this medicine in children. While this drug may be prescribed for children 17 years or older for selected conditions, precautions do apply.  Overdosage: If you think you have taken too much of this medicine contact a poison control center or emergency room at once. NOTE: This medicine is only for you. Do not share this medicine with others. What if I miss a dose? If you miss a dose, take it as soon as you can. If it is almost time for your next dose, take only that dose. Do not take double or extra doses. What may interact with this medicine? Do not take this medicine with any of the following medications: -MAOIs like Carbex, Eldepryl, Marplan, Nardil, and Parnate -medicines for colds or breathing difficulties like pseudoephedrine or phenylephrine -procarbazine -sibutramine -stimulant medicines for attention disorders, weight loss, or to stay awake This medicine may also interact with the following medications: -certain medicines for depression, anxiety, or psychotic disturbances -linezolid -medicines for diabetes -medicines for high blood pressure This list may not describe all possible interactions. Give your health care provider a list of all the medicines, herbs, non-prescription drugs, or dietary supplements you use. Also tell them if you smoke, drink alcohol, or use illegal drugs. Some items may interact with your medicine. What should I watch for while using this medicine? Notify your physician immediately if you become short of breath while doing your normal activities. Do not take this medicine within 6 hours of bedtime. It can keep you from getting to sleep. Avoid drinks that contain caffeine and try to stick to a regular bedtime every night. This medicine was intended to be used in addition to a healthy diet  and exercise. The best results are achieved this way. This medicine is only indicated for short-term use. Eventually your weight loss may level out. At that point, the drug will only help you maintain your new weight. Do not increase or in any way change your dose without consulting your doctor. You may get drowsy or  dizzy. Do not drive, use machinery, or do anything that needs mental alertness until you know how this medicine affects you. Do not stand or sit up quickly, especially if you are an older patient. This reduces the risk of dizzy or fainting spells. Alcohol may increase dizziness and drowsiness. Avoid alcoholic drinks. What side effects may I notice from receiving this medicine? Side effects that you should report to your doctor or health care professional as soon as possible: -allergic reactions like skin rash, itching or hives, swelling of the face, lips, or tongue) -anxiety -breathing problems -changes in vision -chest pain or chest tightness -depressed mood or other mood changes -hallucinations, loss of contact with reality -fast, irregular heartbeat -increased blood pressure -irritable -nervousness or restlessness -painful urination -palpitations -tremors -trouble sleeping -seizures -signs and symptoms of a stroke like changes in vision; confusion; trouble speaking or understanding; severe headaches; sudden numbness or weakness of the face, arm or leg; trouble walking; dizziness; loss of balance or coordination -unusually weak or tired -vomiting Side effects that usually do not require medical attention (report to your doctor or health care professional if they continue or are bothersome): -constipation or diarrhea -dry mouth -headache -nausea -stomach upset -sweating This list may not describe all possible side effects. Call your doctor for medical advice about side effects. You may report side effects to FDA at 1-800-FDA-1088. Where should I keep my medicine? Keep out of the reach of children. This medicine can be abused. Keep your medicine in a safe place to protect it from theft. Do not share this medicine with anyone. Selling or giving away this medicine is dangerous and against the law. This medicine may cause accidental overdose and death if taken by other adults, children,  or pets. Mix any unused medicine with a substance like cat litter or coffee grounds. Then throw the medicine away in a sealed container like a sealed bag or a coffee can with a lid. Do not use the medicine after the expiration date. Store at room temperature between 20 and 25 degrees C (68 and 77 degrees F). Keep container tightly closed. NOTE: This sheet is a summary. It may not cover all possible information. If you have questions about this medicine, talk to your doctor, pharmacist, or health care provider.  2019 Elsevier/Gold Standard (2017-04-01 08:23:13)

## 2019-03-13 LAB — COMPLETE METABOLIC PANEL WITH GFR
AG Ratio: 1.8 (calc) (ref 1.0–2.5)
ALT: 11 U/L (ref 6–29)
AST: 15 U/L (ref 10–35)
Albumin: 4.2 g/dL (ref 3.6–5.1)
Alkaline phosphatase (APISO): 51 U/L (ref 37–153)
BUN/Creatinine Ratio: 41 (calc) — ABNORMAL HIGH (ref 6–22)
BUN: 18 mg/dL (ref 7–25)
CO2: 31 mmol/L (ref 20–32)
Calcium: 9.7 mg/dL (ref 8.6–10.4)
Chloride: 101 mmol/L (ref 98–110)
Creat: 0.44 mg/dL — ABNORMAL LOW (ref 0.50–1.05)
GFR, Est African American: 128 mL/min/{1.73_m2} (ref >=60)
GFR, Est Non African American: 110 mL/min/{1.73_m2} (ref >=60)
Globulin: 2.4 g/dL (calc) (ref 1.9–3.7)
Glucose, Bld: 103 mg/dL — ABNORMAL HIGH (ref 65–99)
Potassium: 4.2 mmol/L (ref 3.5–5.3)
Sodium: 139 mmol/L (ref 135–146)
Total Bilirubin: 0.4 mg/dL (ref 0.2–1.2)
Total Protein: 6.6 g/dL (ref 6.1–8.1)

## 2019-03-13 LAB — LIPID PANEL
Cholesterol: 187 mg/dL (ref <200)
HDL: 92 mg/dL (ref >=50)
LDL Cholesterol (Calc): 78 mg/dL (calc)
Non-HDL Cholesterol (Calc): 95 mg/dL (calc) (ref <130)
Total CHOL/HDL Ratio: 2 (calc) (ref <5.0)
Triglycerides: 88 mg/dL (ref <150)

## 2019-03-13 LAB — CBC WITH DIFFERENTIAL/PLATELET
Absolute Monocytes: 490 cells/uL (ref 200–950)
Basophils Absolute: 41 cells/uL (ref 0–200)
Basophils Relative: 0.6 %
Eosinophils Absolute: 122 cells/uL (ref 15–500)
Eosinophils Relative: 1.8 %
HCT: 37.3 % (ref 35.0–45.0)
Hemoglobin: 12.1 g/dL (ref 11.7–15.5)
Lymphs Abs: 3713 cells/uL (ref 850–3900)
MCH: 25.3 pg — ABNORMAL LOW (ref 27.0–33.0)
MCHC: 32.4 g/dL (ref 32.0–36.0)
MCV: 77.9 fL — ABNORMAL LOW (ref 80.0–100.0)
MPV: 11.1 fL (ref 7.5–12.5)
Monocytes Relative: 7.2 %
Neutro Abs: 2434 cells/uL (ref 1500–7800)
Neutrophils Relative %: 35.8 %
Platelets: 403 10*3/uL — ABNORMAL HIGH (ref 140–400)
RBC: 4.79 10*6/uL (ref 3.80–5.10)
RDW: 14.5 % (ref 11.0–15.0)
Total Lymphocyte: 54.6 %
WBC: 6.8 10*3/uL (ref 3.8–10.8)

## 2019-03-13 LAB — HEMOGLOBIN A1C
Hgb A1c MFr Bld: 6.9 % of total Hgb — ABNORMAL HIGH (ref <5.7)
Mean Plasma Glucose: 151 (calc)
eAG (mmol/L): 8.4 (calc)

## 2019-03-13 LAB — MAGNESIUM: Magnesium: 1.8 mg/dL (ref 1.5–2.5)

## 2019-03-13 LAB — TSH: TSH: 0.36 mIU/L — ABNORMAL LOW (ref 0.40–4.50)

## 2019-04-07 ENCOUNTER — Other Ambulatory Visit: Payer: Self-pay | Admitting: Internal Medicine

## 2019-04-15 ENCOUNTER — Other Ambulatory Visit: Payer: Self-pay | Admitting: Internal Medicine

## 2019-05-11 ENCOUNTER — Other Ambulatory Visit: Payer: Self-pay | Admitting: Internal Medicine

## 2019-05-11 DIAGNOSIS — G5711 Meralgia paresthetica, right lower limb: Secondary | ICD-10-CM

## 2019-05-11 DIAGNOSIS — M545 Low back pain, unspecified: Secondary | ICD-10-CM

## 2019-05-11 DIAGNOSIS — G8929 Other chronic pain: Secondary | ICD-10-CM

## 2019-05-14 ENCOUNTER — Other Ambulatory Visit: Payer: Self-pay | Admitting: Internal Medicine

## 2019-05-14 DIAGNOSIS — Z20828 Contact with and (suspected) exposure to other viral communicable diseases: Secondary | ICD-10-CM | POA: Diagnosis not present

## 2019-05-14 DIAGNOSIS — Z7189 Other specified counseling: Secondary | ICD-10-CM | POA: Diagnosis not present

## 2019-05-25 ENCOUNTER — Other Ambulatory Visit: Payer: Self-pay | Admitting: Adult Health

## 2019-07-09 ENCOUNTER — Other Ambulatory Visit: Payer: Self-pay | Admitting: Internal Medicine

## 2019-07-16 NOTE — Progress Notes (Signed)
Complete Physical  Assessment and Plan:  Encounter for general adult medical examination with abnormal findings - follow up GYN  Essential hypertension - continue medications, DASH diet, exercise and monitor at home.  -     CBC with Differential/Platelet -     CMP/GFR -     TSH -     Urinalysis, Routine w reflex microscopic -     Microalbumin / creatinine urine ratio -     EKG 12-Lead  Diabetes mellitus without complication ([place]) Discussed general issues about diabetes pathophysiology and management., Educational material distributed., Suggested low cholesterol diet., Encouraged aerobic exercise., Discussed foot care., Reminded to get yearly retinal exam. -     Hemoglobin A1c  OSA (obstructive sleep apnea) Sleep apnea- continue CPAP, CPAP is helping with daytime fatigue, weight loss still advised.   Mixed hyperlipidemia -continue medications, check lipids, decrease fatty foods, increase activity. -     Lipid panel  Morbid obesity ([place]) - long discussion about weight loss, diet, and exercise - excellent progress with exercise and phentermine without SE, reminded to take frequent breaks - continue close q103m follow up   Palpitations Control triggers, none recently  Vitamin D deficiency Continue supplementation Check vitamin D level  Goiter Biopsy negative, stable on f/u US 12/2018, monitor - TSH  Environmental allergies Continue OTC allergy pills  Tortuous aorta (HCC) Control blood pressure, cholesterol, glucose, increase exercise.   Chronic right-sided low back pain without sciatica Established with Dr. Ninfa Linden, gabapentin PRN but hasn't needed Improved with walking and weight loss  Medication management -     Magnesium  Anemia, unspecified type  Check per patient, has flexible spending funds, understands insurance may not pay -     Iron and TIBC -     Vitamin B12  Discussed med's effects and SE's. Screening labs and tests as requested with regular follow-up  as recommended. Over 40 minutes of exam, counseling, chart review, and complex, high level critical decision making was performed this visit.   Future Appointments  Date Time Provider Eugenio Saenz  07/17/2020  3:00 PM Liane Comber, NP [facility] None    HPI  58 y.o. female, presents for a complete physical and follow up for has Hypertension; Hyperlipidemia associated with type 2 diabetes mellitus ([place]); Diabetes mellitus without complication ([place]); Environmental allergies; OSA (obstructive sleep apnea); Goiter; Vitamin D deficiency; Morbid obesity ([place]); Palpitations; Tortuous aorta (HCC); and Carpal tunnel syndrome, left upper limb on their problem list.   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 Ninfa Linden, but reports symptoms improved with daily walking. Takes gabapentin PRN pain but hasn't needed.   She has OSA, on CPAP, does well with this, but admits only wearing 3 days a week or so. Wants a follow up test at some point as last remote but declines at this time in light of covid.   she is prescribed phentermine for weight loss, taking 1/2 tab daily and doing very well with this. While on the medication they have lost 21 lbs since last visit. They deny palpitations, anxiety, trouble sleeping, elevated BP.   BMI is Body mass index is 36.22 kg/m., she has been working on diet and exercise. Walking 3 miles daily, walking with a group of ladies in her neighborhood.   She is doing smaller portions, limiting carbs, feels can sustain this long run.  She is pushing water intake, getting at least 80 fluid  ounces daily.  Wt Readings from Last 3 Encounters:  07/18/19 211 lb (95.7 kg)  03/12/19 232 lb (105.2 kg)  11/30/18 236 lb (107 kg)   She hasn't been checking BP but does have cuff, today their BP is BP: (!) 144/72 She does workout. She denies chest pain, shortness of breath, dizziness.  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 Ophelia CharterYates in [place], had normal CT chest 2012, normal CXR 2017.   She has hx of palpitations recently well controlled, had normal workup in ED and outpatient cardiology but hasn't had since, no problems even with addition of phentermine.   She is not on cholesterol medication and denies myalgias. Her cholesterol is at goal. The cholesterol last visit was:   Lab Results  Component Value Date   CHOL 187 03/12/2019   HDL 92 03/12/2019   LDLCALC 78 03/12/2019   TRIG 88 03/12/2019   CHOLHDL 2.0 03/12/2019   She has been working on diet and exercise for Diabetes without complications, currently treated by metformin 2000 mg daily, she is on bASA, she is on ACE/ARB, does not check her sugars at home, and denies nausea, paresthesia of the feet, polydipsia and polyuria. She does check fasting glucose, running 125-130. Last A1C was:  Lab Results  Component Value Date   HGBA1C 6.9 (H) 03/12/2019   Last GFR: Lab Results  Component Value Date   GFRAA 128 03/12/2019   Patient is on Vitamin D supplement.   Lab Results  Component Value Date   VD25OH 5975 07/17/2018          Current Medications:  Current Outpatient Medications on File Prior to Visit  Medication Sig Dispense Refill  . aspirin EC 81 MG tablet Take 81 mg by mouth daily.      . B Complex-C (SUPER B COMPLEX PO) Take by mouth daily.    . bisoprolol-hydrochlorothiazide (ZIAC) 10-6.25 MG tablet Take 1 tablet Daily for BP 90 tablet 3  . Cholecalciferol (VITAMIN D PO) Take 5,000 Int'l Units by mouth 2 (two) times a week.     . cloNIDine (CATAPRES) 0.2 MG tablet Take 1 tablet 2 x /day for BP 180 tablet 3  . diclofenac sodium (VOLTAREN) 1 % GEL Apply 2 g topically 4 (four) times daily. (Patient taking differently: Apply 2 g topically as needed. ) 100 g 3  . furosemide (LASIX) 40 MG  tablet Take 1 tablet 2 x /day for BP & Fluid 180 tablet 1  . gabapentin (NEURONTIN) 100 MG capsule TAKE 1 CAPSULE 2 TO 3 TIMES A DAY. (Patient taking differently: as needed. Take 1 capsule 2 to 3 times a day.) 270 capsule 0  . hydrALAZINE (APRESOLINE) 25 MG tablet TAKE 1 TABLET (25 MG TOTAL) BY MOUTH 3 (THREE) TIMES DAILY. NO REFILLS UNTIL SEEN CALL FOR APPT 270 tablet 1  . Magnesium 250 MG TABS Take 250 mg by mouth daily.    . metFORMIN (GLUCOPHAGE-XR) 500 MG 24 hr tablet Take 2 tablets 2 x /day with Meals for Diabetes 360 tablet 1  . Multiple Vitamins-Minerals (MULTIVITAMIN PO) Take by mouth daily.    Marland Kitchen. olmesartan (BENICAR) 40 MG tablet TAKE 1 TABLET BY MOUTH EVERY DAY 90 tablet 1  . phentermine (ADIPEX-P) 37.5 MG tablet Take 1/2 to 1 tablet every morning for dieting & weightloss 30 tablet 2  . Vitamin D, Ergocalciferol, (DRISDOL) 1.25 MG (  50000 UT) CAPS capsule TAKE ONE CAPSULE BY MOUTH TWICE WEEKLY 24 capsule 2   No current facility-administered medications on file prior to visit.    Allergies:  Allergies  Allergen Reactions  . Amlodipine Swelling  . Naproxen Hives  . Meloxicam & Diet Manage Prod Rash   Medical History:  She has Hypertension; Hyperlipidemia associated with type 2 diabetes mellitus (HCC); Diabetes mellitus without complication (HCC); Environmental allergies; OSA (obstructive sleep apnea); Goiter; Vitamin D deficiency; Morbid obesity (HCC); Palpitations; Tortuous aorta (HCC); and Carpal tunnel syndrome, left upper limb on their problem list.  Health Maintenance:   Immunization History  Administered Date(s) Administered  . Influenza Inj Mdck Quad With Preservative 07/17/2018  . Influenza Split 07/18/2013, 10/01/2014  . Influenza,inj,quad, With Preservative 09/02/2016  . Pneumococcal Polysaccharide-23 06/16/1999  . Tdap 03/10/2009   Tetanus: 2010, DUE Pneumovax: 2000 Flu vaccine: 2019 - will get in Oct  Shingles: DUE, wants to call and ask  LMP:  Hysterectomy,  sees GYN Pelvic/PAP: last by GYN MGM:  04/2018, due, patient will schedule  Colonoscopy: 2014 due 10 years Echo 2017  Last Eye Exam: Dr. Hyacinth Meeker, 04/07/2018- report verified and abstracted- DUE  Last Dental Exam: Dr. Dan Humphreys, last visit 2020, goes q29m  Patient Care Team: Lucky Cowboy, MD as PCP - General (Internal Medicine)  Surgical History:  She has a past surgical history that includes Eye surgery; achiles tendon; Abdominal hysterectomy; and Tonsillectomy. Family History:  Herfamily history includes Diabetes in her father and paternal grandmother; Heart disease in her father, paternal grandmother, and sister; Hypertension in her brother, mother, and sister. Social History:  She reports that she has never smoked. She has never used smokeless tobacco. She reports that she does not drink alcohol or use drugs.  Review of Systems: Review of Systems  Constitutional: Negative for chills, fever and malaise/fatigue.  HENT: Negative for congestion, ear pain and sore throat.   Eyes: Negative.   Respiratory: Negative for cough, shortness of breath and wheezing.   Cardiovascular: Negative for chest pain, palpitations and leg swelling.  Gastrointestinal: Negative for abdominal pain, blood in stool, constipation, diarrhea, heartburn and melena.  Genitourinary: Negative.   Musculoskeletal: Negative for back pain, falls, joint pain, myalgias and neck pain.  Skin: Negative.   Neurological: Negative for dizziness, sensory change, loss of consciousness and headaches.  Psychiatric/Behavioral: Negative for depression. The patient is not nervous/anxious and does not have insomnia.     Physical Exam: Estimated body mass index is 36.22 kg/m as calculated from the following:   Height as of this encounter: 5\' 4"  (1.626 m).   Weight as of this encounter: 211 lb (95.7 kg). BP (!) 144/72   Pulse 76   Temp (!) 97.3 F (36.3 C)   Ht 5\' 4"  (1.626 m)   Wt 211 lb (95.7 kg)   SpO2 99%   BMI 36.22 kg/m   General Appearance: Well nourished, in no apparent distress.  Eyes: PERRLA, EOMs, conjunctiva no swelling or erythema, fundal exam deferred to ophthalmology  Sinuses: No Frontal/maxillary tenderness  ENT/Mouth: Ext aud canals clear, normal light reflex with TMs without erythema, bulging. Good dentition. No erythema, swelling, or exudate on post pharynx. Tonsils not swollen or erythematous. Hearing normal.  Neck: Supple, thyroid normal. No bruits  Respiratory: Respiratory effort normal, BS equal bilaterally without rales, rhonchi, wheezing or stridor.  Cardio: RRR without murmurs, rubs or gallops. Brisk peripheral pulses without edema.  Chest: symmetric, with normal excursions and percussion.  Breasts: breasts appear normal, no  suspicious masses, no skin or nipple changes or axillary nodes. Abdomen: Soft, nontender, no guarding, rebound, hernias, masses, or organomegaly.  Lymphatics: Non tender without lymphadenopathy.  Genitourinary: Defer to GYN Musculoskeletal: Full ROM all peripheral extremities,5/5 strength, and normal gait.  Skin: Warm, dry without rashes, lesions, ecchymosis. Neuro: Cranial nerves intact, reflexes equal bilaterally. Normal muscle tone, no cerebellar symptoms. Sensation intact. Sensation intact to monofilament bilateral feet.  Psych: Awake and oriented X 3, normal affect, Insight and Judgment appropriate.   EKG: Sinus bradycardia, no ST changes.  Carlyon ShadowAshley C Emersynn Deatley 3:07 PM [facility]

## 2019-07-18 ENCOUNTER — Ambulatory Visit: Payer: BC Managed Care – PPO | Admitting: Adult Health

## 2019-07-18 ENCOUNTER — Encounter: Payer: Self-pay | Admitting: Adult Health

## 2019-07-18 ENCOUNTER — Other Ambulatory Visit: Payer: Self-pay

## 2019-07-18 VITALS — BP 144/72 | HR 76 | Temp 97.3°F | Ht 64.0 in | Wt 211.0 lb

## 2019-07-18 DIAGNOSIS — Z9109 Other allergy status, other than to drugs and biological substances: Secondary | ICD-10-CM

## 2019-07-18 DIAGNOSIS — Z130 Encounter for screening for diseases of the blood and blood-forming organs and certain disorders involving the immune mechanism: Secondary | ICD-10-CM | POA: Diagnosis not present

## 2019-07-18 DIAGNOSIS — R002 Palpitations: Secondary | ICD-10-CM

## 2019-07-18 DIAGNOSIS — Z1329 Encounter for screening for other suspected endocrine disorder: Secondary | ICD-10-CM | POA: Diagnosis not present

## 2019-07-18 DIAGNOSIS — Z1389 Encounter for screening for other disorder: Secondary | ICD-10-CM | POA: Diagnosis not present

## 2019-07-18 DIAGNOSIS — Z13220 Encounter for screening for lipoid disorders: Secondary | ICD-10-CM

## 2019-07-18 DIAGNOSIS — D649 Anemia, unspecified: Secondary | ICD-10-CM

## 2019-07-18 DIAGNOSIS — Z131 Encounter for screening for diabetes mellitus: Secondary | ICD-10-CM

## 2019-07-18 DIAGNOSIS — Z79899 Other long term (current) drug therapy: Secondary | ICD-10-CM | POA: Diagnosis not present

## 2019-07-18 DIAGNOSIS — Z0000 Encounter for general adult medical examination without abnormal findings: Secondary | ICD-10-CM | POA: Diagnosis not present

## 2019-07-18 DIAGNOSIS — E1169 Type 2 diabetes mellitus with other specified complication: Secondary | ICD-10-CM

## 2019-07-18 DIAGNOSIS — E049 Nontoxic goiter, unspecified: Secondary | ICD-10-CM

## 2019-07-18 DIAGNOSIS — Z136 Encounter for screening for cardiovascular disorders: Secondary | ICD-10-CM

## 2019-07-18 DIAGNOSIS — E119 Type 2 diabetes mellitus without complications: Secondary | ICD-10-CM

## 2019-07-18 DIAGNOSIS — I10 Essential (primary) hypertension: Secondary | ICD-10-CM

## 2019-07-18 DIAGNOSIS — E559 Vitamin D deficiency, unspecified: Secondary | ICD-10-CM

## 2019-07-18 DIAGNOSIS — I771 Stricture of artery: Secondary | ICD-10-CM

## 2019-07-18 DIAGNOSIS — Z23 Encounter for immunization: Secondary | ICD-10-CM

## 2019-07-18 DIAGNOSIS — G5602 Carpal tunnel syndrome, left upper limb: Secondary | ICD-10-CM

## 2019-07-18 DIAGNOSIS — Z0001 Encounter for general adult medical examination with abnormal findings: Secondary | ICD-10-CM

## 2019-07-18 DIAGNOSIS — G4733 Obstructive sleep apnea (adult) (pediatric): Secondary | ICD-10-CM

## 2019-07-18 NOTE — Addendum Note (Signed)
Addended by: Chancy Hurter on: 07/18/2019 04:05 PM   Modules accepted: Orders

## 2019-07-18 NOTE — Patient Instructions (Addendum)
  Tanya [name] , Thank you for taking time to come for your Annual Wellness Visit. I appreciate your ongoing commitment to your health goals. Please review the following plan we discussed and let me know if I can assist you in the future.   These are the goals we discussed: Goals    . Blood Pressure < 130/80    . HEMOGLOBIN A1C < 7.0    . LDL CALC < 70    . Weight (lb) < 200 lb (90.7 kg)       This is a list of the screening recommended for you and due dates:  Health Maintenance  Topic Date Due  . Tetanus Vaccine  03/11/2019  . Eye exam for diabetics  04/08/2019  . Flu Shot  06/02/2019  . Complete foot exam   07/18/2019  . HIV Screening  07/18/2019*  . Hemoglobin A1C  09/12/2019  . Mammogram  04/21/2020  . Colon Cancer Screening  11/24/2022  . Pneumococcal vaccine  Completed  .  Hepatitis C: One time screening is recommended by Center for Disease Control  (CDC) for  adults born from [birth year] through 1966.   Completed  . Pap Smear  Discontinued  *Topic was postponed. The date shown is not the original due date.     Please call your insurance and ask about whether they cover the shingrix vaccine - if not and have leftover flex spending money, consider getting at [facility] or [facility]   Check with GYN whether they recommend another PAP/pelvic or not  Please schedule a diabetic eye exam with Dr. Sabra Heck    HOW TO SCHEDULE A MAMMOGRAM  [facility]  7 a.m.-6:30 p.m., Monday 7 a.m.-5 p.m., Tuesday-Friday Schedule an appointment by calling [phone].  [facility] Schedule an appointment by calling [phone].    Know what a healthy weight is for you (roughly BMI <25) and aim to maintain this  Aim for 7+ servings of fruits and vegetables daily  65-80+ fluid ounces of water or unsweet tea for healthy kidneys  Limit to max 1 drink of alcohol per day; avoid smoking/tobacco  Limit animal fats in diet for cholesterol and heart health - choose  grass fed whenever available  Avoid highly processed foods, and foods high in saturated/trans fats  Aim for low stress - take time to unwind and care for your mental health  Aim for 150 min of moderate intensity exercise weekly for heart health, and weights twice weekly for bone health  Aim for 7-9 hours of sleep daily

## 2019-07-19 ENCOUNTER — Other Ambulatory Visit: Payer: Self-pay | Admitting: Adult Health

## 2019-07-19 LAB — CBC WITH DIFFERENTIAL/PLATELET
Absolute Monocytes: 476 cells/uL (ref 200–950)
Basophils Absolute: 52 cells/uL (ref 0–200)
Basophils Relative: 0.9 %
Eosinophils Absolute: 87 cells/uL (ref 15–500)
Eosinophils Relative: 1.5 %
HCT: 36.3 % (ref 35.0–45.0)
Hemoglobin: 11.7 g/dL (ref 11.7–15.5)
Lymphs Abs: 2871 cells/uL (ref 850–3900)
MCH: 26.2 pg — ABNORMAL LOW (ref 27.0–33.0)
MCHC: 32.2 g/dL (ref 32.0–36.0)
MCV: 81.2 fL (ref 80.0–100.0)
MPV: 11.4 fL (ref 7.5–12.5)
Monocytes Relative: 8.2 %
Neutro Abs: 2314 cells/uL (ref 1500–7800)
Neutrophils Relative %: 39.9 %
Platelets: 437 10*3/uL — ABNORMAL HIGH (ref 140–400)
RBC: 4.47 10*6/uL (ref 3.80–5.10)
RDW: 14.4 % (ref 11.0–15.0)
Total Lymphocyte: 49.5 %
WBC: 5.8 10*3/uL (ref 3.8–10.8)

## 2019-07-19 LAB — URINALYSIS, ROUTINE W REFLEX MICROSCOPIC
Bacteria, UA: NONE SEEN /HPF
Bilirubin Urine: NEGATIVE
Glucose, UA: NEGATIVE
Hgb urine dipstick: NEGATIVE
Hyaline Cast: NONE SEEN /LPF
Nitrite: NEGATIVE
Specific Gravity, Urine: 1.026 (ref 1.001–1.03)
Squamous Epithelial / HPF: NONE SEEN /HPF (ref <=5)
pH: 5.5 (ref 5.0–8.0)

## 2019-07-19 LAB — MICROALBUMIN / CREATININE URINE RATIO
Creatinine, Urine: 186 mg/dL (ref 20–275)
Microalb Creat Ratio: 25 mcg/mg creat (ref <30)
Microalb, Ur: 4.7 mg/dL

## 2019-07-19 LAB — VITAMIN D 25 HYDROXY (VIT D DEFICIENCY, FRACTURES): Vit D, 25-Hydroxy: >150 ng/mL — ABNORMAL HIGH (ref 30–100)

## 2019-07-19 LAB — COMPLETE METABOLIC PANEL WITH GFR
AG Ratio: 1.7 (calc) (ref 1.0–2.5)
ALT: 12 U/L (ref 6–29)
AST: 17 U/L (ref 10–35)
Albumin: 4.3 g/dL (ref 3.6–5.1)
Alkaline phosphatase (APISO): 51 U/L (ref 37–153)
BUN: 14 mg/dL (ref 7–25)
CO2: 32 mmol/L (ref 20–32)
Calcium: 9.7 mg/dL (ref 8.6–10.4)
Chloride: 100 mmol/L (ref 98–110)
Creat: 0.53 mg/dL (ref 0.50–1.05)
GFR, Est African American: 120 mL/min/{1.73_m2} (ref >=60)
GFR, Est Non African American: 104 mL/min/{1.73_m2} (ref >=60)
Globulin: 2.5 g/dL (calc) (ref 1.9–3.7)
Glucose, Bld: 118 mg/dL — ABNORMAL HIGH (ref 65–99)
Potassium: 3.6 mmol/L (ref 3.5–5.3)
Sodium: 142 mmol/L (ref 135–146)
Total Bilirubin: 0.5 mg/dL (ref 0.2–1.2)
Total Protein: 6.8 g/dL (ref 6.1–8.1)

## 2019-07-19 LAB — IRON, TOTAL/TOTAL IRON BINDING CAP
%SAT: 21 % (calc) (ref 16–45)
Iron: 65 ug/dL (ref 45–160)
TIBC: 311 mcg/dL (calc) (ref 250–450)

## 2019-07-19 LAB — HEMOGLOBIN A1C
Hgb A1c MFr Bld: 6.8 % of total Hgb — ABNORMAL HIGH (ref <5.7)
Mean Plasma Glucose: 148 (calc)
eAG (mmol/L): 8.2 (calc)

## 2019-07-19 LAB — LIPID PANEL
Cholesterol: 186 mg/dL (ref <200)
HDL: 93 mg/dL (ref >=50)
LDL Cholesterol (Calc): 78 mg/dL (calc)
Non-HDL Cholesterol (Calc): 93 mg/dL (calc) (ref <130)
Total CHOL/HDL Ratio: 2 (calc) (ref <5.0)
Triglycerides: 73 mg/dL (ref <150)

## 2019-07-19 LAB — MAGNESIUM: Magnesium: 1.8 mg/dL (ref 1.5–2.5)

## 2019-07-19 LAB — VITAMIN B12: Vitamin B-12: 322 pg/mL (ref 200–1100)

## 2019-07-19 LAB — TSH: TSH: 0.48 mIU/L (ref 0.40–4.50)

## 2019-07-19 MED ORDER — VITAMIN D (ERGOCALCIFEROL) 1.25 MG (50000 UNIT) PO CAPS: ORAL_CAPSULE | ORAL | 2 refills | Status: DC

## 2019-08-05 ENCOUNTER — Other Ambulatory Visit: Payer: Self-pay | Admitting: Adult Health

## 2019-08-05 DIAGNOSIS — G8929 Other chronic pain: Secondary | ICD-10-CM

## 2019-08-05 DIAGNOSIS — G5711 Meralgia paresthetica, right lower limb: Secondary | ICD-10-CM

## 2019-08-08 ENCOUNTER — Telehealth: Payer: BLUE CROSS/BLUE SHIELD | Admitting: Family

## 2019-08-08 DIAGNOSIS — J069 Acute upper respiratory infection, unspecified: Secondary | ICD-10-CM

## 2019-08-08 MED ORDER — FLUTICASONE PROPIONATE 50 MCG/ACT NA SUSP: 2.0000 | Freq: Every day | NASAL | 6 refills | Status: DC

## 2019-08-08 NOTE — Progress Notes (Signed)
We are sorry you are not feeling well.  Here is how we plan to help!  Based on what you have shared with me, it looks like you may have a viral upper respiratory infection.  Upper respiratory infections are caused by a large number of viruses; however, rhinovirus is the most common cause.   Symptoms vary from person to person, with common symptoms including sore throat, cough, fatigue or lack of energy and feeling of general discomfort.  A low-grade fever of up to 100.4 may present, but is often uncommon.  Symptoms vary however, and are closely related to a person's age or underlying illnesses.  The most common symptoms associated with an upper respiratory infection are nasal discharge or congestion, cough, sneezing, headache and pressure in the ears and face.  These symptoms usually persist for about 3 to 10 days, but can last up to 2 weeks.  It is important to know that upper respiratory infections do not cause serious illness or complications in most cases.    Upper respiratory infections can be transmitted from person to person, with the most common method of transmission being a person's hands.  The virus is able to live on the skin and can infect other persons for up to 2 hours after direct contact.  Also, these can be transmitted when someone coughs or sneezes; thus, it is important to cover the mouth to reduce this risk.  To keep the spread of the illness at bay, good hand hygiene is very important.  This is an infection that is most likely caused by a virus. There are no specific treatments other than to help you with the symptoms until the infection runs its course.  We are sorry you are not feeling well.  Here is how we plan to help!   For nasal congestion, you may use an oral decongestants such as Mucinex D or if you have glaucoma or high blood pressure use plain Mucinex.  Saline nasal spray or nasal drops can help and can safely be used as often as needed for congestion.  For your congestion,  I have prescribed Fluticasone nasal spray one spray in each nostril twice a day  If you do not have a history of heart disease, hypertension, diabetes or thyroid disease, prostate/bladder issues or glaucoma, you may also use Sudafed to treat nasal congestion.  It is highly recommended that you consult with a pharmacist or your primary care physician to ensure this medication is safe for you to take.     If you have a cough, you may use cough suppressants such as Delsym and Robitussin.  If you have glaucoma or high blood pressure, you can also use Coricidin HBP.     If you have a sore or scratchy throat, use a saltwater gargle-  to  teaspoon of salt dissolved in a 4-ounce to 8-ounce glass of warm water.  Gargle the solution for approximately 15-30 seconds and then spit.  It is important not to swallow the solution.  You can also use throat lozenges/cough drops and Chloraseptic spray to help with throat pain or discomfort.  Warm or cold liquids can also be helpful in relieving throat pain.  For headache, pain or general discomfort, you can use Ibuprofen or Tylenol as directed.   Some authorities believe that zinc sprays or the use of Echinacea may shorten the course of your symptoms.   HOME CARE . Only take medications as instructed by your medical team. . Be sure to drink   plenty of fluids. Water is fine as well as fruit juices, sodas and electrolyte beverages. You may want to stay away from caffeine or alcohol. If you are nauseated, try taking small sips of liquids. How do you know if you are getting enough fluid? Your urine should be a pale yellow or almost colorless. . Get rest. . Taking a steamy shower or using a humidifier may help nasal congestion and ease sore throat pain. You can place a towel over your head and breathe in the steam from hot water coming from a faucet. . Using a saline nasal spray works much the same way. . Cough drops, hard candies and sore throat lozenges may ease your  cough. . Avoid close contacts especially the very young and the elderly . Cover your mouth if you cough or sneeze . Always remember to wash your hands.   GET HELP RIGHT AWAY IF: . You develop worsening fever. . If your symptoms do not improve within 10 days . You develop yellow or green discharge from your nose over 3 days. . You have coughing fits . You develop a severe head ache or visual changes. . You develop shortness of breath, difficulty breathing or start having chest pain . Your symptoms persist after you have completed your treatment plan  MAKE SURE YOU   Understand these instructions.  Will watch your condition.  Will get help right away if you are not doing well or get worse.  Approximately 5 minutes was spent documenting and reviewing patient's chart.   Your e-visit answers were reviewed by a board certified advanced clinical practitioner to complete your personal care plan. Depending upon the condition, your plan could have included both over the counter or prescription medications. Please review your pharmacy choice. If there is a problem, you may call our nursing hot line at and have the prescription routed to another pharmacy. Your safety is important to [place]. If you have drug allergies check your prescription carefully.   You can use MyChart to ask questions about today's visit, request a non-urgent call back, or ask for a work or school excuse for 24 hours related to this e-Visit. If it has been greater than 24 hours you will need to follow up with your provider, or enter a new e-Visit to address those concerns. You will get an e-mail in the next two days asking about your experience.  I hope that your e-visit has been valuable and will speed your recovery. Thank you for using e-visits.

## 2019-08-09 ENCOUNTER — Other Ambulatory Visit: Payer: Self-pay | Admitting: Internal Medicine

## 2019-08-15 ENCOUNTER — Other Ambulatory Visit: Payer: Self-pay

## 2019-08-15 ENCOUNTER — Ambulatory Visit (INDEPENDENT_AMBULATORY_CARE_PROVIDER_SITE_OTHER): Payer: BC Managed Care – PPO | Admitting: *Deleted

## 2019-08-15 VITALS — Temp 97.6°F

## 2019-08-15 DIAGNOSIS — Z23 Encounter for immunization: Secondary | ICD-10-CM | POA: Diagnosis not present

## 2019-08-20 ENCOUNTER — Other Ambulatory Visit: Payer: Self-pay | Admitting: Internal Medicine

## 2019-08-20 ENCOUNTER — Other Ambulatory Visit: Payer: Self-pay | Admitting: Adult Health

## 2019-08-20 DIAGNOSIS — Z1231 Encounter for screening mammogram for malignant neoplasm of breast: Secondary | ICD-10-CM

## 2019-08-22 DIAGNOSIS — E119 Type 2 diabetes mellitus without complications: Secondary | ICD-10-CM | POA: Diagnosis not present

## 2019-08-22 LAB — HM DIABETES EYE EXAM

## 2019-08-27 ENCOUNTER — Encounter: Payer: Self-pay | Admitting: Internal Medicine

## 2019-10-05 ENCOUNTER — Other Ambulatory Visit: Payer: Self-pay | Admitting: Adult Health

## 2019-10-05 ENCOUNTER — Ambulatory Visit
Admission: RE | Admit: 2019-10-05 | Discharge: 2019-10-05 | Disposition: A | Discharge status: Home / Self Care | Payer: BC Managed Care – PPO | Source: Ambulatory Visit | Attending: Internal Medicine | Admitting: Internal Medicine

## 2019-10-05 ENCOUNTER — Other Ambulatory Visit: Payer: Self-pay

## 2019-10-05 DIAGNOSIS — Z1231 Encounter for screening mammogram for malignant neoplasm of breast: Secondary | ICD-10-CM

## 2019-10-17 NOTE — Progress Notes (Signed)
FOLLOW UP  Assessment and Plan:   Tortuous aorta Per CXR 07/2016 Control blood pressure, cholesterol, glucose, increase exercise.   Hypertension Borderline elevated today; she is not checking at home, monitor more closely Improved on olmesartan 40 mg daily, continue other medicaitons Monitor blood pressure at home; patient to call if consistently greater than 130/80 Continue DASH diet.   Reminder to go to the ER if any CP, SOB, nausea, dizziness, severe HA, changes vision/speech, left arm numbness and tingling and jaw pain.  Cholesterol Fairly managed by lifestyle without medication though not quite at diabetic goal of LDL <70, have deferred medication per strong patient preference Continue low cholesterol diet and exercise.  Check lipid panel.   Diabetes without complications Continue medication: metformin 2000 mg daily  Continue diet and exercise.  Perform daily foot/skin check, notify office of any concerning changes.  Check A1C  Morbid Obesity with co morbidities - OSA, T2DM, hyperlipidemia, htn Long discussion about weight loss, diet, and exercise Recommended diet heavy in fruits and veggies and low in animal meats, cheeses, and dairy products, appropriate calorie intake Discussed ideal weight for height -  Patient will - continue exercise, low carb, watch portions, increase fluids, weighing once weekly Patient on phentermine with benefit and no SE, taking drug breaks; continue close follow up.  Will follow up in 3 months  Goiter Neg biopsy of nodule in 2012; stable on follow up US 12/2018 Check TSH Monitor   Vitamin D Def/ osteoporosis prevention Continue supplementation; above goal last visit - did stop for 1 month but then resumed [id number] twice weekly rather than reducing to once weekly  Check Vit D level   Continue diet and meds as discussed. Further disposition pending results of labs. Discussed med's effects and SE's.   Over 30 minutes of exam, counseling, chart  review, and critical decision making was performed.   Future Appointments  Date Time Provider Department Center  01/21/2020  9:30 AM Judd Gaudierorbett, Dyrell Tuccillo, NP [facility] None  07/17/2020  3:00 PM Judd Gaudierorbett, Helix Lafontaine, NP [facility] None    ----------------------------------------------------------------------------------------------------------------------  HPI 58 y.o. female  presents for 3 month follow up on hypertension, cholesterol, T2DM, morbid obesity and vitamin D deficiency.   She has OSA, previously on CPAP, reports she stopped taking, now trying to lose weight and plans to get rechecked after.   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  She  left hand carpal tunnel of L; tingling much improved with brace at night; she is followed by Dr. Magnus IvanBlackman for orthopedic concerns, has been recommended will likely require surgery in the future but has been deferring for now. She reports symptoms are rare and well controlled at this time.    BMI is Body mass index is 34.5 kg/m., she has been walking 2 miles/day (40 min). She has recently started cutting back on carbs and increasing protein. She has been pushing water, aiming for 65 fluid ounces daily. She is avoiding fried foods and cutting down on red meat and carbs, pushing veggies. She is taking phentermine, 1/2 tab a few days a week and doing well with this, denies side effects. Weight is down from 236 lb in 11/2018.  Wt Readings from Last 3 Encounters:  10/18/19 201 lb (91.2 kg)  07/18/19 211 lb (95.7 kg)  03/12/19 232 lb (105.2 kg)   She has known tortuous aorta per CXR 07/2016 Has isolated event of palpitations in 2017, saw cardiology who ordered ECHO due to evidence of sarcoidosis  which was normal, no recurrent episodes. She admits to not checking BP at home, does have new BP machine, today their BP is BP: 138/72  She does currently work out.  She denies chest pain, shortness of breath, dizziness.   She is not on  cholesterol medication despite recommendation due to strong patient preference. Her cholesterol is not at goal of LDL <70. The cholesterol last visit was:   Lab Results  Component Value Date   CHOL 186 07/18/2019   HDL 93 07/18/2019   LDLCALC 78 07/18/2019   TRIG 73 07/18/2019   CHOLHDL 2.0 07/18/2019    She has not been working on diet and exercise for T2 diabetes on metformin 1000 mg BID, and denies increased appetite, nausea, paresthesia of the feet, polydipsia, polyuria, visual disturbances and vomiting. Doesn't check sugars, has equipment if needed. Last A1C in the office was:  Lab Results  Component Value Date   HGBA1C 6.8 (H) 07/18/2019   She has non-neoplastic goiter with R lobe nodule (neg biopsy 2012) that we are monitoring with new mildly low TSH noted at last visit, thyroid US showed nodule unchanged from previous:     Lab Results  Component Value Date   TSH 0.48 07/18/2019   Patient is on Vitamin D supplement and above goal at the last check:  Lab Results  Component Value Date   VD25OH >150 (H) 07/18/2019    She reports she did stop taking for 1 month as instructed, but restarted taking twice weekly again instead of once weekly.   Current Medications:  Current Outpatient Medications on File Prior to Visit  Medication Sig  . aspirin EC 81 MG tablet Take 81 mg by mouth daily.    . B Complex-C (SUPER B COMPLEX PO) Take by mouth daily.  . bisoprolol-hydrochlorothiazide (ZIAC) 10-6.25 MG tablet Take 1 tablet Daily for BP  . Cholecalciferol (VITAMIN D PO) Take 5,000 Int'l Units by mouth 2 (two) times a week.   . cloNIDine (CATAPRES) 0.2 MG tablet Take 1 tablet 2 x /day for BP  . Cyanocobalamin (VITAMIN B-12 SL) Place under the tongue daily.  . diclofenac sodium (VOLTAREN) 1 % GEL Apply 2 g topically 4 (four) times daily. (Patient taking differently: Apply 2 g topically as needed. )  . fluticasone (FLONASE) 50 MCG/ACT nasal spray Place 2 sprays into both nostrils daily.  .  furosemide (LASIX) 40 MG tablet TAKE 1 TABLET 2 TIMES DAILY BY MOUTH FOR BLOOD PRESSURE & FLUID  . gabapentin (NEURONTIN) 100 MG capsule Take 1 capsule 3 times a day for Pain  . hydrALAZINE (APRESOLINE) 25 MG tablet TAKE 1 TABLET (25 MG TOTAL) BY MOUTH 3 (THREE) TIMES DAILY. NO REFILLS UNTIL SEEN CALL FOR APPT  . Magnesium 250 MG TABS Take 250 mg by mouth daily.  . metFORMIN (GLUCOPHAGE-XR) 500 MG 24 hr tablet Take 2 tablets 2 x /day with Meals for Diabetes  . Multiple Vitamins-Minerals (MULTIVITAMIN PO) Take by mouth daily.  Marland Kitchen olmesartan (BENICAR) 40 MG tablet Take 1 tablet Daily  for BP  & Diabetic Kidney Protection  . phentermine (ADIPEX-P) 37.5 MG tablet TAKE 1/2 TO 1 TABLET EVERY MORNING FOR DIETING & WEIGHTLOSS  . Vitamin D, Ergocalciferol, (DRISDOL) 1.25 MG (50000 UT) CAPS capsule TAKE ONE CAPSULE BY MOUTH TWICE WEEKLY   No current facility-administered medications on file prior to visit.     Allergies:  Allergies  Allergen Reactions  . Amlodipine Swelling  . Naproxen Hives  . Meloxicam & Diet Manage  Prod Rash     Medical History:  Past Medical History:  Diagnosis Date  . Allergy   . Diabetes mellitus   . Goiter   . Hyperlipidemia   . Hypertension   . Morbid obesity ([place]) 12/24/2013  . OSA (obstructive sleep apnea)   . Palpitations 08/01/2016  . Sarcoid   . Sarcoidosis   . Vitamin D deficiency    Family history- Reviewed and unchanged Social history- Reviewed and unchanged   Review of Systems:  Review of Systems  Constitutional: Negative for malaise/fatigue and weight loss.  HENT: Negative for hearing loss and tinnitus.   Eyes: Negative for blurred vision and double vision.  Respiratory: Negative for cough, shortness of breath and wheezing.   Cardiovascular: Negative for chest pain, palpitations, orthopnea, claudication and leg swelling.  Gastrointestinal: Negative for abdominal pain, blood in stool, constipation, diarrhea, heartburn, melena, nausea and vomiting.   Genitourinary: Negative.   Musculoskeletal: Negative for back pain, joint pain and myalgias.  Skin: Negative for rash.  Neurological: Negative for dizziness, tingling, sensory change, weakness and headaches.  Endo/Heme/Allergies: Negative for polydipsia.  Psychiatric/Behavioral: Negative.   All other systems reviewed and are negative.   Physical Exam: BP 138/72   Pulse (!) 55   Temp (!) 96.8 F (36 C)   Wt 201 lb (91.2 kg)   SpO2 98%   BMI 34.50 kg/m  Wt Readings from Last 3 Encounters:  10/18/19 201 lb (91.2 kg)  07/18/19 211 lb (95.7 kg)  03/12/19 232 lb (105.2 kg)   General Appearance: Well nourished, in no apparent distress. Eyes: PERRLA, EOMs, conjunctiva no swelling or erythema Sinuses: No Frontal/maxillary tenderness ENT/Mouth: Ext aud canals clear, TMs without erythema, bulging. Mask in place, no concerns, oral exam deferred. Hearing normal.  Neck: Supple, thyroid normal.  Respiratory: Respiratory effort normal, BS equal bilaterally without rales, rhonchi, wheezing or stridor.  Cardio: RRR with no MRGs. Brisk peripheral pulses without edema.  Abdomen: Soft, + BS.  Non tender, no guarding, rebound, hernias, masses. Lymphatics: Non tender without lymphadenopathy.  Musculoskeletal: Full ROM, 5/5 strength, Normal gait.  Skin: Warm, dry without rashes, lesions, ecchymosis.  Neuro: Cranial nerves intact. No cerebellar symptoms.  Psych: Awake and oriented X 3, normal affect, Insight and Judgment appropriate.    Izora Ribas, NP 9:45 AM [facility]

## 2019-10-18 ENCOUNTER — Other Ambulatory Visit: Payer: Self-pay

## 2019-10-18 ENCOUNTER — Encounter: Payer: Self-pay | Admitting: Adult Health

## 2019-10-18 ENCOUNTER — Ambulatory Visit: Payer: BC Managed Care – PPO | Admitting: Adult Health

## 2019-10-18 VITALS — BP 138/72 | HR 55 | Temp 96.8°F | Wt 201.0 lb

## 2019-10-18 DIAGNOSIS — E662 Morbid (severe) obesity with alveolar hypoventilation: Secondary | ICD-10-CM

## 2019-10-18 DIAGNOSIS — E785 Hyperlipidemia, unspecified: Secondary | ICD-10-CM | POA: Diagnosis not present

## 2019-10-18 DIAGNOSIS — I771 Stricture of artery: Secondary | ICD-10-CM

## 2019-10-18 DIAGNOSIS — E1169 Type 2 diabetes mellitus with other specified complication: Secondary | ICD-10-CM

## 2019-10-18 DIAGNOSIS — I10 Essential (primary) hypertension: Secondary | ICD-10-CM

## 2019-10-18 DIAGNOSIS — R350 Frequency of micturition: Secondary | ICD-10-CM | POA: Diagnosis not present

## 2019-10-18 DIAGNOSIS — Z79899 Other long term (current) drug therapy: Secondary | ICD-10-CM

## 2019-10-18 DIAGNOSIS — R809 Proteinuria, unspecified: Secondary | ICD-10-CM

## 2019-10-18 DIAGNOSIS — E119 Type 2 diabetes mellitus without complications: Secondary | ICD-10-CM

## 2019-10-18 DIAGNOSIS — E559 Vitamin D deficiency, unspecified: Secondary | ICD-10-CM | POA: Diagnosis not present

## 2019-10-18 NOTE — Patient Instructions (Addendum)
Goals    . Blood Pressure < 130/80    . HEMOGLOBIN A1C < 7.0    . LDL CALC < 70    . Weight (lb) < 200 lb (90.7 kg)       Please stop super B complex - taking sublingual instead   Will recheck vitamin D - probably recommend reducing to take once a week rather than twice weekly     HYPERTENSION INFORMATION  Monitor your blood pressure at home, please keep a record and bring that in with you to your next office visit.   Go to the ER if any CP, SOB, nausea, dizziness, severe HA, changes vision/speech  Testing/Procedures: HOW TO TAKE YOUR BLOOD PRESSURE:  Rest 5 minutes before taking your blood pressure.  Don't smoke or drink caffeinated beverages for at least 30 minutes before.  Take your blood pressure before (not after) you eat.  Sit comfortably with your back supported and both feet on the floor (don't cross your legs).  Elevate your arm to heart level on a table or a desk.  Use the proper sized cuff. It should fit smoothly and snugly around your bare upper arm. There should be enough room to slip a fingertip under the cuff. The bottom edge of the cuff should be 1 inch above the crease of the elbow.  Due to a recent study, SPRINT, we have changed our goal for the systolic or top blood pressure number. Ideally we want your top number at 120.  In the [facility] Trial, 5000 people were randomized to a goal BP of 120 and 5000 people were randomized to a goal BP of less than 140. The patients with the goal BP at 120 had LESS DEMENTIA, LESS HEART ATTACKS, AND LESS STROKES, AS WELL AS OVERALL DECREASED MORTALITY OR DEATH RATE.   There was another study that showed taking your blood pressure medications at night decrease cardiovascular events.  However if you are on a fluid pill, please take this in the morning.   If you are willing, our goal BP is the top number of 120.   Your most recent BP: BP: 138/72   Take your medications faithfully as instructed. Maintain a healthy  weight. Get at least 150 minutes of aerobic exercise per week. Minimize salt intake. Minimize alcohol intake  DASH Eating Plan DASH stands for "Dietary Approaches to Stop Hypertension." The DASH eating plan is a healthy eating plan that has been shown to reduce high blood pressure (hypertension). Additional health benefits may include reducing the risk of type 2 diabetes mellitus, heart disease, and stroke. The DASH eating plan may also help with weight loss. WHAT DO I NEED TO KNOW ABOUT THE DASH EATING PLAN? For the DASH eating plan, you will follow these general guidelines:  Choose foods with a percent daily value for sodium of less than 5% (as listed on the food label).  Use salt-free seasonings or herbs instead of table salt or sea salt.  Check with your health care provider or pharmacist before using salt substitutes.  Eat lower-sodium products, often labeled as "lower sodium" or "no salt added."  Eat fresh foods.  Eat more vegetables, fruits, and low-fat dairy products.  Choose whole grains. Look for the word "whole" as the first word in the ingredient list.  Choose fish and skinless chicken or [place] more often than red meat. Limit fish, poultry, and meat to 6 oz (170 g) each day.  Limit sweets, desserts, sugars, and sugary drinks.  Choose  heart-healthy fats.  Limit cheese to 1 oz (28 g) per day.  Eat more home-cooked food and less restaurant, buffet, and fast food.  Limit fried foods.  Cook foods using methods other than frying.  Limit canned vegetables. If you do use them, rinse them well to decrease the sodium.  When eating at a restaurant, ask that your food be prepared with less salt, or no salt if possible. WHAT FOODS CAN I EAT? Seek help from a dietitian for individual calorie needs. Grains Whole grain or whole wheat bread. Brown rice. Whole grain or whole wheat pasta. Quinoa, bulgur, and whole grain cereals. Low-sodium cereals. Corn or whole wheat flour  tortillas. Whole grain cornbread. Whole grain crackers. Low-sodium crackers. Vegetables Fresh or frozen vegetables (raw, steamed, roasted, or grilled). Low-sodium or reduced-sodium tomato and vegetable juices. Low-sodium or reduced-sodium tomato sauce and paste. Low-sodium or reduced-sodium canned vegetables.  Fruits All fresh, canned (in natural juice), or frozen fruits. Meat and Other Protein Products Ground beef (85% or leaner), grass-fed beef, or beef trimmed of fat. Skinless chicken or [place]. Ground chicken or [place]. Pork trimmed of fat. All fish and seafood. Eggs. Dried beans, peas, or lentils. Unsalted nuts and seeds. Unsalted canned beans. Dairy Low-fat dairy products, such as skim or 1% milk, 2% or reduced-fat cheeses, low-fat ricotta or cottage cheese, or plain low-fat yogurt. Low-sodium or reduced-sodium cheeses. Fats and Oils Tub margarines without trans fats. Light or reduced-fat mayonnaise and salad dressings (reduced sodium). Avocado. Safflower, olive, or canola oils. Natural peanut or almond butter. Other Unsalted popcorn and pretzels. The items listed above may not be a complete list of recommended foods or beverages. Contact your dietitian for more options. WHAT FOODS ARE NOT RECOMMENDED? Grains White bread. White pasta. White rice. Refined cornbread. Bagels and croissants. Crackers that contain trans fat. Vegetables Creamed or fried vegetables. Vegetables in a cheese sauce. Regular canned vegetables. Regular canned tomato sauce and paste. Regular tomato and vegetable juices. Fruits Dried fruits. Canned fruit in light or heavy syrup. Fruit juice. Meat and Other Protein Products Fatty cuts of meat. Ribs, chicken wings, bacon, sausage, bologna, salami, chitterlings, fatback, hot dogs, bratwurst, and packaged luncheon meats. Salted nuts and seeds. Canned beans with salt. Dairy Whole or 2% milk, cream, half-and-half, and cream cheese. Whole-fat or sweetened yogurt. Full-fat  cheeses or blue cheese. Nondairy creamers and whipped toppings. Processed cheese, cheese spreads, or cheese curds. Condiments Onion and garlic salt, seasoned salt, table salt, and sea salt. Canned and packaged gravies. Worcestershire sauce. Tartar sauce. Barbecue sauce. Teriyaki sauce. Soy sauce, including reduced sodium. Steak sauce. Fish sauce. Oyster sauce. Cocktail sauce. Horseradish. Ketchup and mustard. Meat flavorings and tenderizers. Bouillon cubes. Hot sauce. Tabasco sauce. Marinades. Taco seasonings. Relishes. Fats and Oils Butter, stick margarine, lard, shortening, ghee, and bacon fat. Coconut, palm kernel, or palm oils. Regular salad dressings. Other Pickles and olives. Salted popcorn and pretzels. The items listed above may not be a complete list of foods and beverages to avoid. Contact your dietitian for more information. WHERE CAN I FIND MORE INFORMATION? National Heart, Lung, and Blood Institute: travelstabloid.com Document Released: 10/07/2011 Document Revised: 03/04/2014 Document Reviewed: 08/22/2013 [facility] Patient Information 2015 [place], [place]. This information is not intended to replace advice given to you by your health care provider. Make sure you discuss any questions you have with your health care provider.

## 2019-10-22 LAB — TEST AUTHORIZATION

## 2019-10-22 LAB — CBC WITH DIFFERENTIAL/PLATELET
Absolute Monocytes: 371 cells/uL (ref 200–950)
Basophils Absolute: 23 cells/uL (ref 0–200)
Basophils Relative: 0.4 %
Eosinophils Absolute: 63 cells/uL (ref 15–500)
Eosinophils Relative: 1.1 %
HCT: 36.7 % (ref 35.0–45.0)
Hemoglobin: 11.8 g/dL (ref 11.7–15.5)
Lymphs Abs: 3050 cells/uL (ref 850–3900)
MCH: 25.5 pg — ABNORMAL LOW (ref 27.0–33.0)
MCHC: 32.2 g/dL (ref 32.0–36.0)
MCV: 79.4 fL — ABNORMAL LOW (ref 80.0–100.0)
MPV: 11 fL (ref 7.5–12.5)
Monocytes Relative: 6.5 %
Neutro Abs: 2195 cells/uL (ref 1500–7800)
Neutrophils Relative %: 38.5 %
Platelets: 437 10*3/uL — ABNORMAL HIGH (ref 140–400)
RBC: 4.62 10*6/uL (ref 3.80–5.10)
RDW: 13.9 % (ref 11.0–15.0)
Total Lymphocyte: 53.5 %
WBC: 5.7 10*3/uL (ref 3.8–10.8)

## 2019-10-22 LAB — URINALYSIS, ROUTINE W REFLEX MICROSCOPIC
Bacteria, UA: NONE SEEN /HPF
Bilirubin Urine: NEGATIVE
Glucose, UA: NEGATIVE
Hgb urine dipstick: NEGATIVE
Hyaline Cast: NONE SEEN /LPF
Ketones, ur: NEGATIVE
Nitrite: NEGATIVE
Protein, ur: NEGATIVE
RBC / HPF: NONE SEEN /HPF (ref 0–2)
Specific Gravity, Urine: 1.014 (ref 1.001–1.03)
Squamous Epithelial / HPF: NONE SEEN /HPF (ref <=5)
pH: <=5 (ref 5.0–8.0)

## 2019-10-22 LAB — COMPLETE METABOLIC PANEL WITH GFR
AG Ratio: 1.6 (calc) (ref 1.0–2.5)
ALT: 13 U/L (ref 6–29)
AST: 14 U/L (ref 10–35)
Albumin: 4.2 g/dL (ref 3.6–5.1)
Alkaline phosphatase (APISO): 48 U/L (ref 37–153)
BUN: 13 mg/dL (ref 7–25)
CO2: 31 mmol/L (ref 20–32)
Calcium: 9.6 mg/dL (ref 8.6–10.4)
Chloride: 101 mmol/L (ref 98–110)
Creat: 0.57 mg/dL (ref 0.50–1.05)
GFR, Est African American: 118 mL/min/{1.73_m2} (ref >=60)
GFR, Est Non African American: 101 mL/min/{1.73_m2} (ref >=60)
Globulin: 2.6 g/dL (calc) (ref 1.9–3.7)
Glucose, Bld: 132 mg/dL — ABNORMAL HIGH (ref 65–99)
Potassium: 3.9 mmol/L (ref 3.5–5.3)
Sodium: 140 mmol/L (ref 135–146)
Total Bilirubin: 0.4 mg/dL (ref 0.2–1.2)
Total Protein: 6.8 g/dL (ref 6.1–8.1)

## 2019-10-22 LAB — LIPID PANEL
Cholesterol: 190 mg/dL (ref <200)
HDL: 98 mg/dL (ref >=50)
LDL Cholesterol (Calc): 78 mg/dL (calc)
Non-HDL Cholesterol (Calc): 92 mg/dL (calc) (ref <130)
Total CHOL/HDL Ratio: 1.9 (calc) (ref <5.0)
Triglycerides: 67 mg/dL (ref <150)

## 2019-10-22 LAB — TSH: TSH: 0.5 mIU/L (ref 0.40–4.50)

## 2019-10-22 LAB — HEMOGLOBIN A1C
Hgb A1c MFr Bld: 6.5 % of total Hgb — ABNORMAL HIGH (ref <5.7)
Mean Plasma Glucose: 140 (calc)
eAG (mmol/L): 7.7 (calc)

## 2019-10-22 LAB — MAGNESIUM: Magnesium: 1.8 mg/dL (ref 1.5–2.5)

## 2019-10-22 LAB — PATHOLOGIST SMEAR REVIEW

## 2019-10-22 LAB — VITAMIN D 25 HYDROXY (VIT D DEFICIENCY, FRACTURES): Vit D, 25-Hydroxy: 146 ng/mL — ABNORMAL HIGH (ref 30–100)

## 2019-11-13 ENCOUNTER — Ambulatory Visit: Payer: BC Managed Care – PPO | Attending: Internal Medicine

## 2019-11-13 ENCOUNTER — Other Ambulatory Visit: Payer: BC Managed Care – PPO

## 2019-11-13 DIAGNOSIS — Z20822 Contact with and (suspected) exposure to covid-19: Secondary | ICD-10-CM | POA: Diagnosis not present

## 2019-11-14 LAB — NOVEL CORONAVIRUS, NAA: SARS-CoV-2, NAA: NOT DETECTED

## 2019-11-26 ENCOUNTER — Other Ambulatory Visit: Payer: Self-pay | Admitting: Internal Medicine

## 2019-11-28 DIAGNOSIS — H40053 Ocular hypertension, bilateral: Secondary | ICD-10-CM | POA: Diagnosis not present

## 2019-11-28 DIAGNOSIS — H40023 Open angle with borderline findings, high risk, bilateral: Secondary | ICD-10-CM | POA: Diagnosis not present

## 2020-01-17 NOTE — Progress Notes (Signed)
FOLLOW UP  Assessment and Plan:   Tortuous aorta Per CXR 07/2016 Control blood pressure, cholesterol, glucose, increase exercise.   Hypertension Borderline elevated today; she is not checking at home, monitor more closely Improved on olmesartan 40 mg daily, continue other medicaitons Monitor blood pressure at home; patient to call if consistently greater than 130/80 Continue DASH diet.   Reminder to go to the ER if any CP, SOB, nausea, dizziness, severe HA, changes vision/speech, left arm numbness and tingling and jaw pain.  Cholesterol Fairly managed by lifestyle without medication though not quite at diabetic goal of LDL <70, have deferred medication per strong patient preference Continue low cholesterol diet and exercise.  Check lipid panel.   Diabetes without complications Continue medication: metformin 2000 mg daily  Continue diet and exercise.  Perform daily foot/skin check, notify office of any concerning changes.  Check A1C  Obesity with co morbidities - OSA, T2DM, hyperlipidemia, htn Commended excellent progress Long discussion about weight loss, diet, and exercise Recommended diet heavy in fruits and veggies and low in animal meats, cheeses, and dairy products, appropriate calorie intake Discussed ideal weight for height -  Patient will - continue exercise, low carb, watch portions, increase fluids, weighing once weekly Patient on phentermine with benefit and no SE, taking drug breaks; continue close follow up.  Will follow up in 3 months  Goiter Neg biopsy of nodule in 2012; stable on follow up US 12/2018 Check TSH Monitor   Vitamin D Def/ osteoporosis prevention Continue supplementation; above goal last visit - did stop for 1 month, reduced from twice a week to once weekly  Check Vit D level   Continue diet and meds as discussed. Further disposition pending results of labs. Discussed med's effects and SE's.   Over 30 minutes of exam, counseling, chart review,  and critical decision making was performed.   Future Appointments  Date Time Provider Department Center  07/17/2020  3:00 PM Judd Gaudier, NP [facility] None    ----------------------------------------------------------------------------------------------------------------------  HPI 59 y.o. female  presents for 3 month follow up on hypertension, cholesterol, T2DM, morbid obesity and vitamin D deficiency.   She recently had first covid 19 vaccine, no issues.   She has OSA, previously on CPAP, reports she stopped taking, now trying to lose weight and plans to get rechecked after. Husband hasn't noted any snoring, patient denies AM fatigue or HA.   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  She  left hand carpal tunnel; tingling much improved with brace at night; she is followed by Dr. Magnus Ivan for orthopedic concerns, has been recommended will likely require surgery in the future but has been deferring for now. She reports symptoms are rare and well controlled at this time.    BMI is Body mass index is 33.64 kg/m., she has been walking 3 miles/day in a group (60 min). She has recently started cutting back on carbs and increasing protein. She has been pushing water, aiming for 65 fluid ounces daily. She is avoiding fried foods and cutting down on red meat and carbs, pushing veggies. She is taking phentermine, 1/2 tab a few days a week and doing well with this, denies side effects. Weight is down from 236 lb in 11/2018.  Goal is to get off of some medications.  Wt Readings from Last 3 Encounters:  01/21/20 196 lb (88.9 kg)  10/18/19 201 lb (91.2 kg)  07/18/19 211 lb (95.7 kg)   She has known tortuous aorta  per CXR 07/2016 Has isolated event of palpitations in 2017, saw cardiology who ordered ECHO due to evidence of sarcoidosis which was normal, no recurrent episodes. She admits to not checking BP at home, does have new BP machine, today their BP is BP: 124/66   She does currently work out.  She denies chest pain, shortness of breath, dizziness.   She is not on cholesterol medication despite recommendation due to strong patient preference. Her cholesterol is not at goal of LDL <70. The cholesterol last visit was:   Lab Results  Component Value Date   CHOL 190 10/18/2019   HDL 98 10/18/2019   LDLCALC 78 10/18/2019   TRIG 67 10/18/2019   CHOLHDL 1.9 10/18/2019    She has been working on diet and exercise for T2 diabetes on metformin 1000 mg BID, and denies increased appetite, nausea, paresthesia of the feet, polydipsia, polyuria, visual disturbances and vomiting. Occasionally checks sugars, 110-120. Last A1C in the office was:  Lab Results  Component Value Date   HGBA1C 6.5 (H) 10/18/2019   Lab Results  Component Value Date   GFRAA 118 10/18/2019   She has non-neoplastic goiter with R lobe nodule (neg biopsy 2012) that we are monitoring, thyroid US in 12/2018 showed nodule unchanged from previous:     Lab Results  Component Value Date   TSH 0.50 10/18/2019   Patient is on Vitamin D supplement and above goal at the last check:  Lab Results  Component Value Date   VD25OH 146 (H) 10/18/2019    She reports she did stop taking for 1 month as instructed, and taking once a week down from twice weekly.   Current Medications:  Current Outpatient Medications on File Prior to Visit  Medication Sig  . aspirin EC 81 MG tablet Take 81 mg by mouth daily.    . bisoprolol-hydrochlorothiazide (ZIAC) 10-6.25 MG tablet Take 1 tablet Daily for BP  . Cholecalciferol (VITAMIN D PO) Take 5,000 Int'l Units by mouth 2 (two) times a week.   . cloNIDine (CATAPRES) 0.2 MG tablet Take 1 tablet 2 x /day for BP  . Cyanocobalamin (VITAMIN B-12 SL) Place under the tongue daily.  . diclofenac sodium (VOLTAREN) 1 % GEL Apply 2 g topically 4 (four) times daily. (Patient taking differently: Apply 2 g topically as needed. )  . fluticasone (FLONASE) 50 MCG/ACT nasal spray  Place 2 sprays into both nostrils daily.  . furosemide (LASIX) 40 MG tablet TAKE 1 TABLET 2 TIMES DAILY BY MOUTH FOR BLOOD PRESSURE & FLUID  . gabapentin (NEURONTIN) 100 MG capsule Take 1 capsule 3 times a day for Pain (Patient taking differently: as needed. Take 1 capsule 3 times a day for Pain)  . hydrALAZINE (APRESOLINE) 25 MG tablet TAKE 1 TABLET (25 MG TOTAL) BY MOUTH 3 (THREE) TIMES DAILY. NO REFILLS UNTIL SEEN CALL FOR APPT  . Magnesium 250 MG TABS Take 250 mg by mouth daily.  . metFORMIN (GLUCOPHAGE-XR) 500 MG 24 hr tablet Take 2 tablets 2 x /day with Meals for Diabetes  . Multiple Vitamins-Minerals (MULTIVITAMIN PO) Take by mouth daily.  Marland Kitchen olmesartan (BENICAR) 40 MG tablet Take 1 tablet Daily  for BP  & Diabetic Kidney Protection  . phentermine (ADIPEX-P) 37.5 MG tablet TAKE 1/2 TO 1 TABLET EVERY MORNING FOR DIETING & WEIGHTLOSS  . Vitamin D, Ergocalciferol, (DRISDOL) 1.25 MG (50000 UT) CAPS capsule TAKE ONE CAPSULE BY MOUTH TWICE WEEKLY (Patient taking differently: TAKE ONE CAPSULE BY MOUTH ONCE WEEKLY)  .  B Complex-C (SUPER B COMPLEX PO) Take by mouth daily.   No current facility-administered medications on file prior to visit.     Allergies:  Allergies  Allergen Reactions  . Amlodipine Swelling  . Naproxen Hives  . Meloxicam & Diet Manage Prod Rash     Medical History:  Past Medical History:  Diagnosis Date  . Allergy   . Diabetes mellitus   . Goiter   . Hyperlipidemia   . Hypertension   . Morbid obesity (HCC) 12/24/2013  . OSA (obstructive sleep apnea)   . Palpitations 08/01/2016  . Sarcoid   . Sarcoidosis   . Vitamin D deficiency    Family history- Reviewed and unchanged Social history- Reviewed and unchanged   Review of Systems:  Review of Systems  Constitutional: Negative for malaise/fatigue and weight loss.  HENT: Negative for hearing loss and tinnitus.   Eyes: Negative for blurred vision and double vision.  Respiratory: Negative for cough, shortness of  breath and wheezing.   Cardiovascular: Negative for chest pain, palpitations, orthopnea, claudication and leg swelling.  Gastrointestinal: Negative for abdominal pain, blood in stool, constipation, diarrhea, heartburn, melena, nausea and vomiting.  Genitourinary: Negative.   Musculoskeletal: Negative for back pain, joint pain and myalgias.  Skin: Negative for rash.  Neurological: Negative for dizziness, tingling, sensory change, weakness and headaches.  Endo/Heme/Allergies: Negative for polydipsia.  Psychiatric/Behavioral: Negative.   All other systems reviewed and are negative.   Physical Exam: BP 124/66   Pulse (!) 51   Temp (!) 96.6 F (35.9 C)   Ht 5\' 4"  (1.626 m)   Wt 196 lb (88.9 kg)   SpO2 99%   BMI 33.64 kg/m  Wt Readings from Last 3 Encounters:  01/21/20 196 lb (88.9 kg)  10/18/19 201 lb (91.2 kg)  07/18/19 211 lb (95.7 kg)   General Appearance: Well nourished, in no apparent distress. Eyes: PERRLA, EOMs, conjunctiva no swelling or erythema Sinuses: No Frontal/maxillary tenderness ENT/Mouth: Ext aud canals clear, TMs without erythema, bulging. Mask in place, no concerns, oral exam deferred. Hearing normal.  Neck: Supple, thyroid normal.  Respiratory: Respiratory effort normal, BS equal bilaterally without rales, rhonchi, wheezing or stridor.  Cardio: RRR with no MRGs. Brisk peripheral pulses without edema.  Abdomen: Soft, + BS.  Non tender, no guarding, rebound, hernias, masses. Lymphatics: Non tender without lymphadenopathy.  Musculoskeletal: Full ROM, 5/5 strength, Normal gait.  Skin: Warm, dry without rashes, lesions, ecchymosis.  Neuro: Cranial nerves intact. No cerebellar symptoms.  Psych: Awake and oriented X 3, normal affect, Insight and Judgment appropriate.    07/20/19, NP 9:48 AM [facility]

## 2020-01-21 ENCOUNTER — Encounter: Payer: Self-pay | Admitting: Adult Health

## 2020-01-21 ENCOUNTER — Ambulatory Visit: Payer: BC Managed Care – PPO | Admitting: Adult Health

## 2020-01-21 ENCOUNTER — Other Ambulatory Visit: Payer: Self-pay

## 2020-01-21 VITALS — BP 124/66 | HR 51 | Temp 96.6°F | Ht 64.0 in | Wt 196.0 lb

## 2020-01-21 DIAGNOSIS — E1169 Type 2 diabetes mellitus with other specified complication: Secondary | ICD-10-CM | POA: Diagnosis not present

## 2020-01-21 DIAGNOSIS — E662 Morbid (severe) obesity with alveolar hypoventilation: Secondary | ICD-10-CM

## 2020-01-21 DIAGNOSIS — E66811 Obesity, class 1: Secondary | ICD-10-CM

## 2020-01-21 DIAGNOSIS — E559 Vitamin D deficiency, unspecified: Secondary | ICD-10-CM

## 2020-01-21 DIAGNOSIS — E049 Nontoxic goiter, unspecified: Secondary | ICD-10-CM

## 2020-01-21 DIAGNOSIS — I771 Stricture of artery: Secondary | ICD-10-CM | POA: Diagnosis not present

## 2020-01-21 DIAGNOSIS — Z79899 Other long term (current) drug therapy: Secondary | ICD-10-CM | POA: Diagnosis not present

## 2020-01-21 DIAGNOSIS — E785 Hyperlipidemia, unspecified: Secondary | ICD-10-CM | POA: Diagnosis not present

## 2020-01-21 DIAGNOSIS — I10 Essential (primary) hypertension: Secondary | ICD-10-CM | POA: Diagnosis not present

## 2020-01-21 DIAGNOSIS — E669 Obesity, unspecified: Secondary | ICD-10-CM

## 2020-01-21 DIAGNOSIS — E119 Type 2 diabetes mellitus without complications: Secondary | ICD-10-CM

## 2020-01-21 MED ORDER — VITAMIN D (ERGOCALCIFEROL) 1.25 MG (50000 UNIT) PO CAPS: ORAL_CAPSULE | ORAL | 2 refills | Status: DC

## 2020-01-21 NOTE — Patient Instructions (Addendum)
Goals    . Blood Pressure < 130/80    . HEMOGLOBIN A1C < 5.7    . LDL CALC < 70    . Weight (lb) < 185 lb (83.9 kg)       Keep up the great work! Goal is to get fasting <100 and A1C <5.7% so we can get off of metformin :)  Look into low glycemic index diet and/or low carb diet which is particularly important for diabetics

## 2020-01-22 LAB — HEMOGLOBIN A1C
Hgb A1c MFr Bld: 6.1 % of total Hgb — ABNORMAL HIGH (ref <5.7)
Mean Plasma Glucose: 128 (calc)
eAG (mmol/L): 7.1 (calc)

## 2020-01-22 LAB — COMPLETE METABOLIC PANEL WITH GFR
AG Ratio: 1.6 (calc) (ref 1.0–2.5)
ALT: 11 U/L (ref 6–29)
AST: 14 U/L (ref 10–35)
Albumin: 4.2 g/dL (ref 3.6–5.1)
Alkaline phosphatase (APISO): 49 U/L (ref 37–153)
BUN: 18 mg/dL (ref 7–25)
CO2: 30 mmol/L (ref 20–32)
Calcium: 9.5 mg/dL (ref 8.6–10.4)
Chloride: 99 mmol/L (ref 98–110)
Creat: 0.51 mg/dL (ref 0.50–1.05)
GFR, Est African American: 122 mL/min/{1.73_m2} (ref >=60)
GFR, Est Non African American: 105 mL/min/{1.73_m2} (ref >=60)
Globulin: 2.6 g/dL (calc) (ref 1.9–3.7)
Glucose, Bld: 115 mg/dL — ABNORMAL HIGH (ref 65–99)
Potassium: 4.2 mmol/L (ref 3.5–5.3)
Sodium: 139 mmol/L (ref 135–146)
Total Bilirubin: 0.4 mg/dL (ref 0.2–1.2)
Total Protein: 6.8 g/dL (ref 6.1–8.1)

## 2020-01-22 LAB — CBC WITH DIFFERENTIAL/PLATELET
Absolute Monocytes: 341 cells/uL (ref 200–950)
Basophils Absolute: 39 cells/uL (ref 0–200)
Basophils Relative: 0.7 %
Eosinophils Absolute: 72 cells/uL (ref 15–500)
Eosinophils Relative: 1.3 %
HCT: 36.6 % (ref 35.0–45.0)
Hemoglobin: 11.8 g/dL (ref 11.7–15.5)
Lymphs Abs: 2844 cells/uL (ref 850–3900)
MCH: 25.9 pg — ABNORMAL LOW (ref 27.0–33.0)
MCHC: 32.2 g/dL (ref 32.0–36.0)
MCV: 80.4 fL (ref 80.0–100.0)
MPV: 11.3 fL (ref 7.5–12.5)
Monocytes Relative: 6.2 %
Neutro Abs: 2206 cells/uL (ref 1500–7800)
Neutrophils Relative %: 40.1 %
Platelets: 379 10*3/uL (ref 140–400)
RBC: 4.55 10*6/uL (ref 3.80–5.10)
RDW: 14.5 % (ref 11.0–15.0)
Total Lymphocyte: 51.7 %
WBC: 5.5 10*3/uL (ref 3.8–10.8)

## 2020-01-22 LAB — LIPID PANEL
Cholesterol: 185 mg/dL (ref <200)
HDL: 88 mg/dL (ref >=50)
LDL Cholesterol (Calc): 84 mg/dL (calc)
Non-HDL Cholesterol (Calc): 97 mg/dL (calc) (ref <130)
Total CHOL/HDL Ratio: 2.1 (calc) (ref <5.0)
Triglycerides: 58 mg/dL (ref <150)

## 2020-01-22 LAB — VITAMIN D 25 HYDROXY (VIT D DEFICIENCY, FRACTURES): Vit D, 25-Hydroxy: 130 ng/mL — ABNORMAL HIGH (ref 30–100)

## 2020-01-22 LAB — TSH: TSH: 0.4 mIU/L (ref 0.40–4.50)

## 2020-01-22 LAB — MAGNESIUM: Magnesium: 1.8 mg/dL (ref 1.5–2.5)

## 2020-02-06 ENCOUNTER — Ambulatory Visit: Payer: BC Managed Care – PPO | Attending: Internal Medicine

## 2020-02-06 DIAGNOSIS — Z20822 Contact with and (suspected) exposure to covid-19: Secondary | ICD-10-CM

## 2020-02-07 LAB — SARS-COV-2, NAA 2 DAY TAT

## 2020-02-07 LAB — NOVEL CORONAVIRUS, NAA: SARS-CoV-2, NAA: NOT DETECTED

## 2020-03-04 IMAGING — US US THYROID
1 series · 14 of 25 positions shown · non-contrast
Comparison: 08/04/2011

CLINICAL DATA: Goiter.

EXAM:
THYROID ULTRASOUND
TECHNIQUE: Ultrasound examination of the thyroid gland and adjacent soft
tissues was performed.

[Series 1: us thyroid · 0.07mm/px · 14 of 50 slices shown]
[im 1/50]
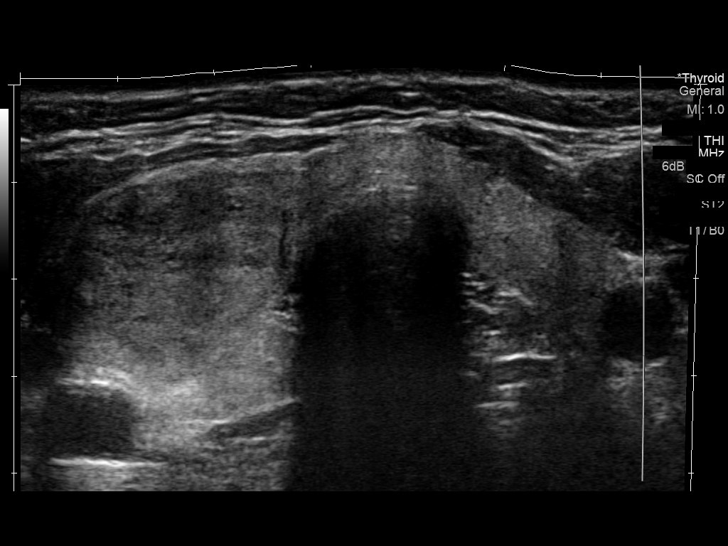
[im 5/50]
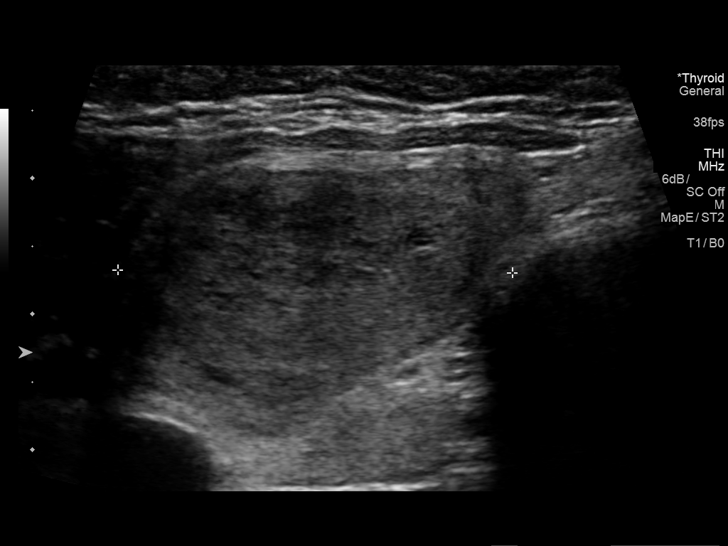
[im 9/50]
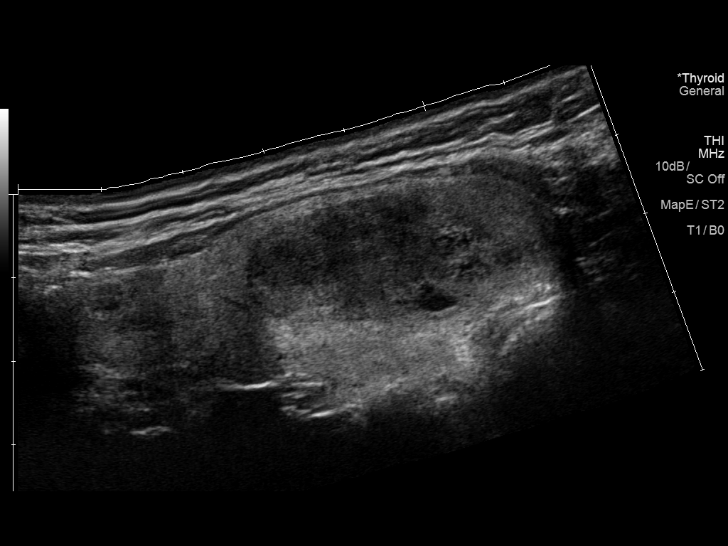
[im 13/50]
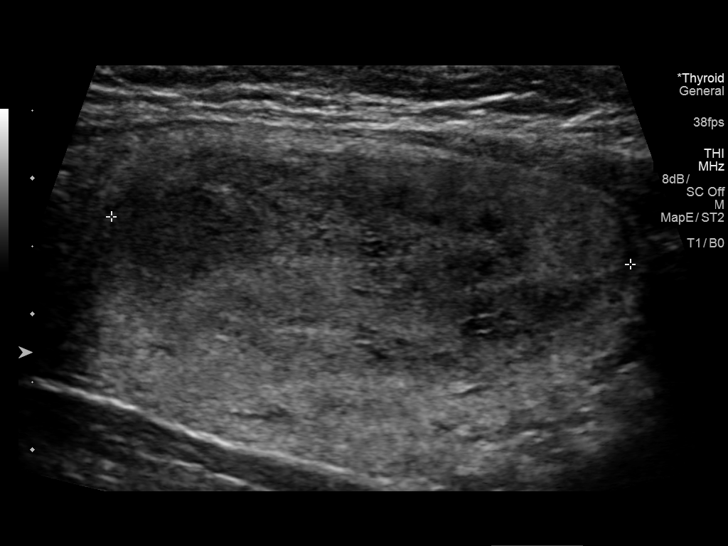
[im 17/50]
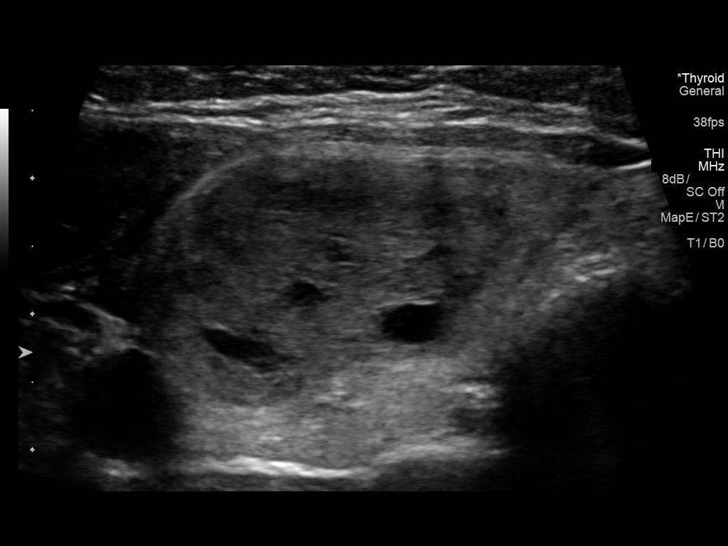
[im 19/50]
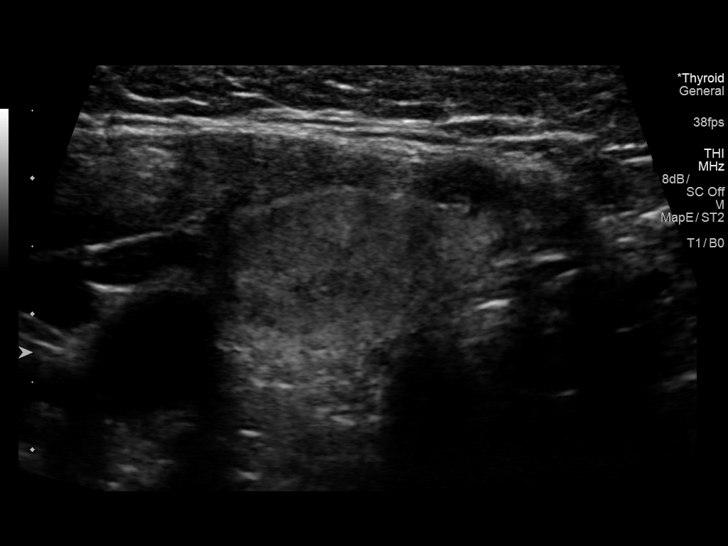
[im 23/50]
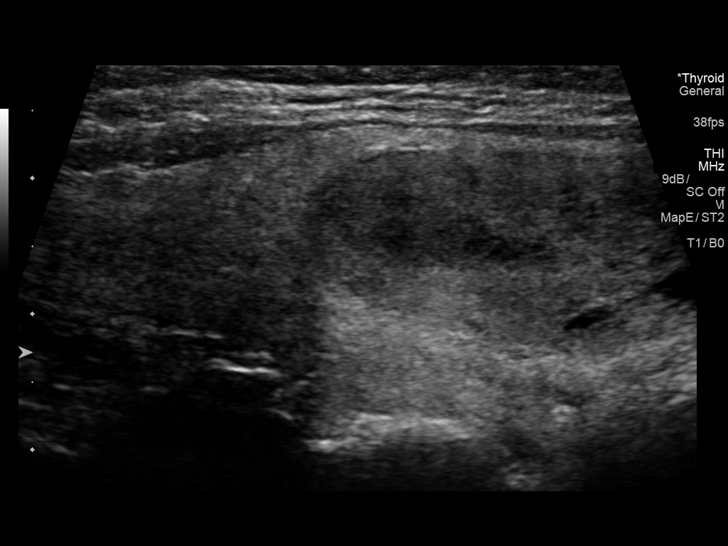
[im 27/50]
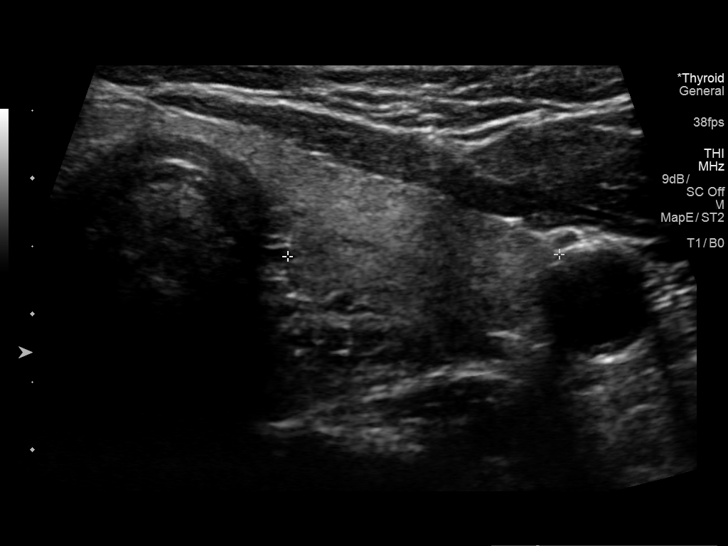
[im 31/50]
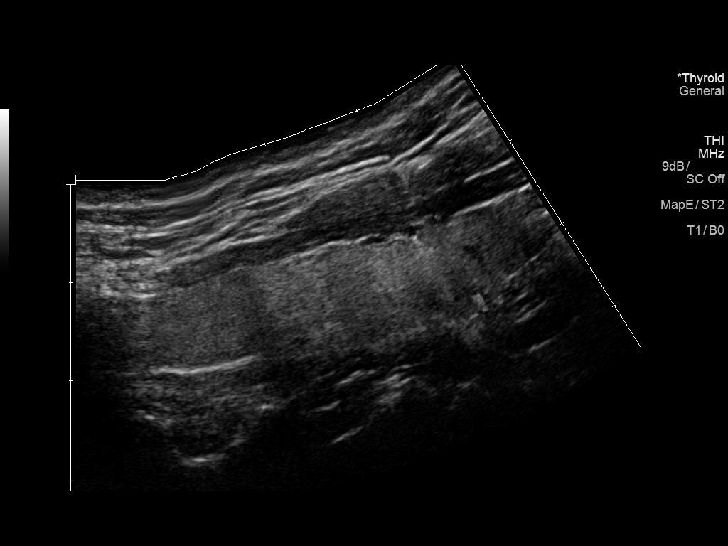
[im 33/50]
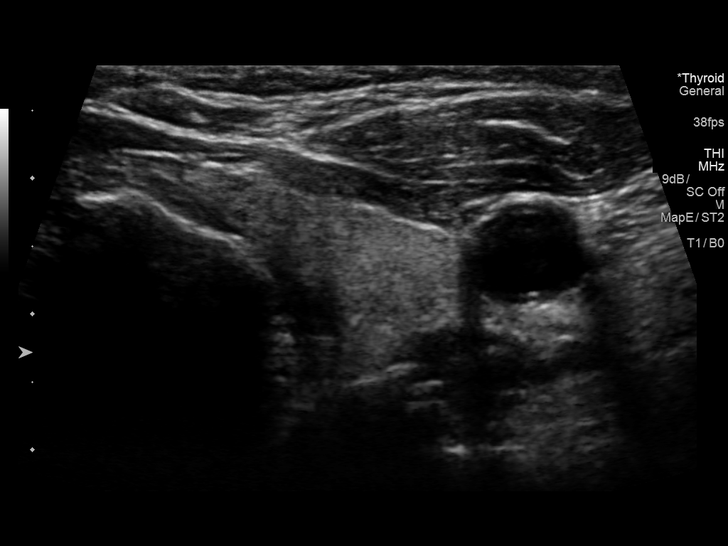
[im 37/50]
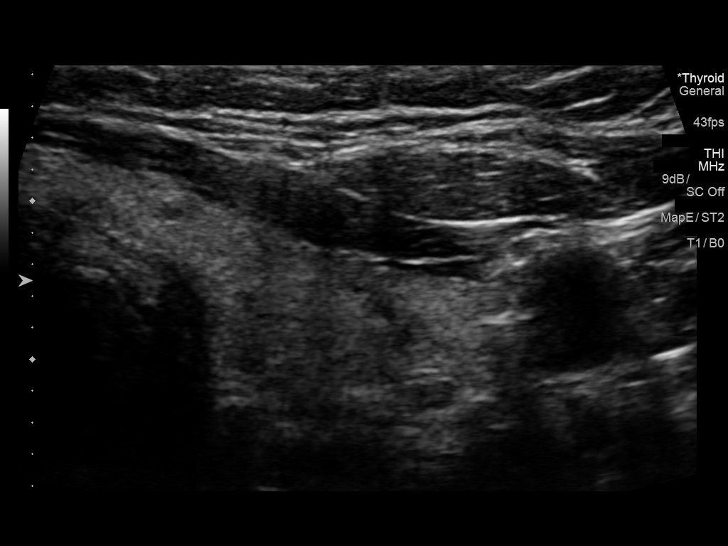
[im 41/50]
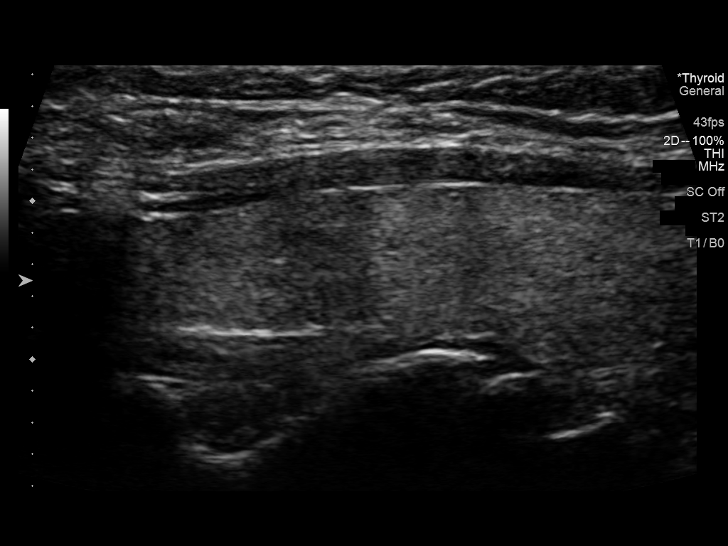
[im 45/50]
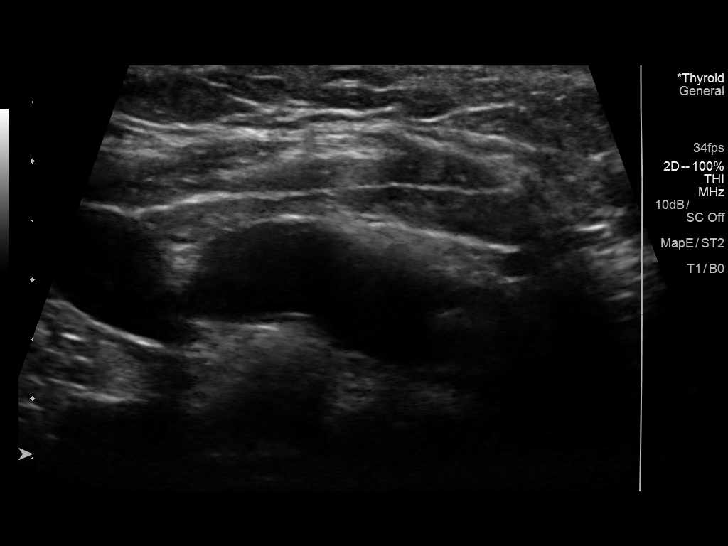
[im 50/50]
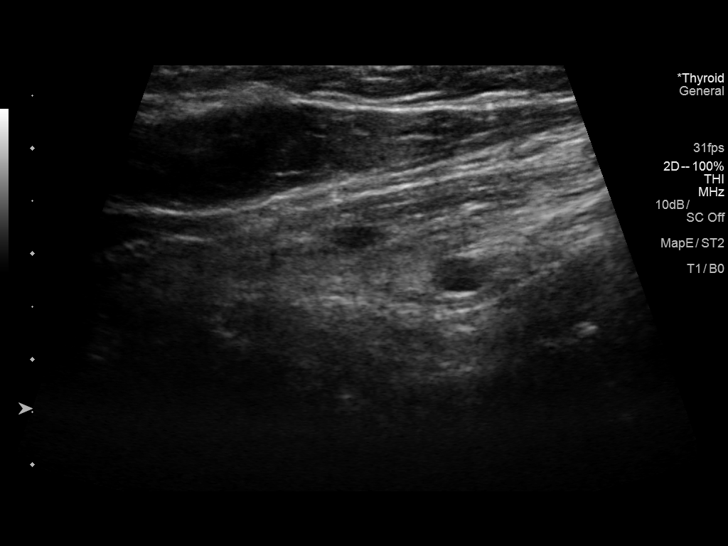

[14 of 25 positions shown; findings below may reference images not displayed]

FINDINGS: Parenchymal Echotexture: Mildly heterogenous

Isthmus: 0.4 cm, previously 0.6 cm

Right lobe: 6.4 x 2.7 x 2.9 cm, previously 6.4 x 2.3 x 2.7 cm

Left lobe: 4.7 x 1.1 x 2.0 cm, previously 5.1 x 1.2 x 2.3 cm

_________________________________________________________

Estimated total number of nodules >/= 1 cm: 1

Number of spongiform nodules >/=  2 cm not described below (TR1): 0

Number of mixed cystic and solid nodules >/= 1.5 cm not described
below (TR2): 0

_________________________________________________________

Right lower pole nodule 1 measures 3.8 x 3.2 x 1.8 cm and previously
measured 3.7 x 2.7 x 1.8 cm. Biopsy was performed 08/11/2011
IMPRESSION: Right nodule 1 is not significantly changed and previously underwent
biopsy on 08/11/2011.

The above is in keeping with the ACR TI-RADS recommendations - [HOSPITAL] 8790;[DATE].

## 2020-03-29 ENCOUNTER — Other Ambulatory Visit: Payer: Self-pay | Admitting: Adult Health

## 2020-03-31 ENCOUNTER — Other Ambulatory Visit: Payer: Self-pay | Admitting: Adult Health

## 2020-04-15 ENCOUNTER — Encounter: Payer: Self-pay | Admitting: Adult Health Nurse Practitioner

## 2020-04-15 ENCOUNTER — Ambulatory Visit: Payer: BC Managed Care – PPO | Admitting: Adult Health Nurse Practitioner

## 2020-04-15 ENCOUNTER — Other Ambulatory Visit: Payer: Self-pay

## 2020-04-15 VITALS — BP 136/70 | HR 55 | Temp 97.3°F | Ht 64.0 in | Wt 195.0 lb

## 2020-04-15 DIAGNOSIS — I10 Essential (primary) hypertension: Secondary | ICD-10-CM

## 2020-04-15 DIAGNOSIS — J3089 Other allergic rhinitis: Secondary | ICD-10-CM | POA: Diagnosis not present

## 2020-04-15 DIAGNOSIS — H6503 Acute serous otitis media, bilateral: Secondary | ICD-10-CM

## 2020-04-15 NOTE — Patient Instructions (Signed)
Get Zrytec-D.  This has an antihistamine and nasal decongestant.  Be sure to check your blood pressure.  The decongestant can elevated, stop taking this and switch to plain Zyrtec (Cetirazine).   Zyrtec (Cetirazine) Xyzal (Levocetirazine) Claritin (Loratadine)    You may continue to take your allegra (Fexafenadine) daily.   If you are not improving or have any new, worsening symptoms, please let us know.  You may contact us via MyChart.

## 2020-04-15 NOTE — Progress Notes (Addendum)
Assessment and Plan:  Tanya [name] was seen today for ear pain, ear fullness and sinus problem.  Diagnoses and all orders for this visit:  Non-recurrent acute serous otitis media of both ears Allergic Rhinitis Zyrtec-D OTC Monitor blood pressure Discussed rotating antihistamines Continue allegra  Essential hypertension Continue current medications: Monitor blood pressure at home; call if consistently over 130/80 Continue DASH diet.   Reminder to go to the ER if any CP, SOB, nausea, dizziness, severe HA, changes vision/speech, left arm numbness and tingling and jaw pain.  Contact office with any new or worsening symptoms.   Further disposition pending results of labs. Discussed med's effects and SE's.   Over 30 minutes of face to face exam, counseling, chart review, and critical decision making was performed.   Future Appointments  Date Time Provider Department Center  04/30/2020  8:45 AM Tanya Gaudier, NP [facility] None  07/31/2020  9:00 AM Tanya Gaudier, NP [facility] None    ------------------------------------------------------------------------------------------------------------------   HPI 59 y.o.female presents for ear fullness bilaterally.  She noticed that she is having some sinus pressure that is intermittent.  The ear fullness started four days ago and more prominent on the right.  Reports she is having some post nasal drip.  She takes Careers adviser or clairatin daily and she does alternate this.  She has not taken any other OTC medications for this.  Denies any water eyes or itchy eyes, coughing, shortness of breath.    Past Medical History:  Diagnosis Date  . Allergy   . Diabetes mellitus   . Goiter   . Hyperlipidemia   . Hypertension   . Morbid obesity (HCC) 12/24/2013  . OSA (obstructive sleep apnea)   . Palpitations 08/01/2016  . Sarcoid   . Sarcoidosis   . Vitamin D deficiency      Allergies  Allergen Reactions  . Amlodipine Swelling  . Naproxen Hives   . Meloxicam & Diet Manage Prod Rash    Current Outpatient Medications on File Prior to Visit  Medication Sig  . aspirin EC 81 MG tablet Take 81 mg by mouth daily.    . B Complex-C (SUPER B COMPLEX PO) Take by mouth daily.  . bisoprolol-hydrochlorothiazide (ZIAC) 10-6.25 MG tablet Take 1 tablet Daily for BP  . cloNIDine (CATAPRES) 0.2 MG tablet Take 1 tablet 2 x /day for BP  . Cyanocobalamin (VITAMIN B-12 SL) Place under the tongue daily.  . diclofenac sodium (VOLTAREN) 1 % GEL Apply 2 g topically 4 (four) times daily. (Patient taking differently: Apply 2 g topically as needed. )  . fluticasone (FLONASE) 50 MCG/ACT nasal spray Place 2 sprays into both nostrils daily.  . furosemide (LASIX) 40 MG tablet TAKE 1 TABLET 2 TIMES DAILY BY MOUTH FOR BLOOD PRESSURE & FLUID  . gabapentin (NEURONTIN) 100 MG capsule Take 1 capsule 3 times a day for Pain (Patient taking differently: as needed. Take 1 capsule 3 times a day for Pain)  . hydrALAZINE (APRESOLINE) 25 MG tablet TAKE 1 TABLET (25 MG TOTAL) BY MOUTH 3 (THREE) TIMES DAILY. NO REFILLS UNTIL SEEN CALL FOR APPT  . Magnesium 250 MG TABS Take 250 mg by mouth daily.  . metFORMIN (GLUCOPHAGE-XR) 500 MG 24 hr tablet Take 2 tablets 2 x /day with Meals for Diabetes  . Multiple Vitamins-Minerals (MULTIVITAMIN PO) Take by mouth daily.  Marland Kitchen olmesartan (BENICAR) 40 MG tablet Take 1 tablet Daily  for BP  & Diabetic Kidney Protection  . phentermine (ADIPEX-P) 37.5 MG tablet TAKE 1/2 TO 1  TABLET EVERY MORNING FOR DIETING & WEIGHTLOSS  . Vitamin D, Ergocalciferol, (DRISDOL) 1.25 MG (50000 UNIT) CAPS capsule TAKE ONE CAPSULE BY MOUTH ONCE WEEKLY   No current facility-administered medications on file prior to visit.    ROS: all negative except above.   Physical Exam:  BP 136/70   Pulse (!) 55   Temp (!) 97.3 F (36.3 C)   Ht 5\' 4"  (1.626 m)   Wt 195 lb (88.5 kg)   SpO2 99%   BMI 33.47 kg/m   General Appearance: Well nourished, in no apparent  distress. Eyes: PERRLA, EOMs, conjunctiva no swelling or erythema Sinuses: No Frontal/maxillary tenderness ENT/Mouth: Ext aud canals clear, TMs without erythema, bulging, Serous noted bilaterally. No erythema, swelling, or exudate on post pharynx.  Tonsils not swollen or erythematous. Hearing normal.  Neck: Supple, thyroid normal.  Respiratory: Respiratory effort normal, BS equal bilaterally without rales, rhonchi, wheezing or stridor.  Cardio: RRR with no MRGs. Brisk peripheral pulses without edema.  Abdomen: Soft, + BS.  Non tender, no guarding, rebound, hernias, masses. Lymphatics: Non tender without lymphadenopathy.  Musculoskeletal: Full ROM, 5/5 strength, normal gait.  Skin: Warm, dry without rashes, lesions, ecchymosis.  Neuro: Cranial nerves intact. Normal muscle tone, no cerebellar symptoms. Sensation intact.  Psych: Awake and oriented X 3, normal affect, Insight and Judgment appropriate.     Tanya Sierras, NP 5:44 PM [facility]

## 2020-04-25 ENCOUNTER — Encounter: Payer: Self-pay | Admitting: Adult Health Nurse Practitioner

## 2020-04-28 NOTE — Progress Notes (Signed)
FOLLOW UP  Assessment and Plan:   Tortuous aorta Per CXR 07/2016 Control blood pressure, cholesterol, glucose, increase exercise.   Hypertension Borderline elevated today; she is not checking at home, monitor more closely Improved on olmesartan 40 mg daily, continue other medicaitons Monitor blood pressure at home; patient to call if consistently greater than 130/80 Continue DASH diet.   Reminder to go to the ER if any CP, SOB, nausea, dizziness, severe HA, changes vision/speech, left arm numbness and tingling and jaw pain.  Cholesterol Fairly managed by lifestyle without medication though not quite at diabetic goal of LDL <70, have deferred medication per strong patient preference Continue low cholesterol diet and exercise.  Check lipid panel.   Diabetes without complications Continue medication: metformin 2000 mg daily  Continue diet and exercise.  Perform daily foot/skin check, notify office of any concerning changes.  Check A1C  Obesity with co morbidities - OSA, T2DM, hyperlipidemia, htn Commended excellent progress Long discussion about weight loss, diet, and exercise Recommended diet heavy in fruits and veggies and low in animal meats, cheeses, and dairy products, appropriate calorie intake Discussed ideal weight for height -  Patient will - continue exercise, low carb, watch portions, increase fluids, weighing once weekly Patient on phentermine with benefit and no SE, taking drug breaks; continue close follow up.  Will follow up in 3 months  Goiter Neg biopsy of nodule in 2012; stable on follow up US 12/2018 Check TSH Monitor   Vitamin D Def/ osteoporosis prevention Continue supplementation; above goal last visit - did stop for 1 month, reduced from twice a week to once weekly  Check Vit D level   Continue diet and meds as discussed. Further disposition pending results of labs. Discussed med's effects and SE's.   Over 30 minutes of exam, counseling, chart review,  and critical decision making was performed.   Future Appointments  Date Time Provider Department Center  07/31/2020  9:00 AM Judd Gaudier, NP [facility] None    ----------------------------------------------------------------------------------------------------------------------  HPI 59 y.o. female  presents for 3 month follow up on hypertension, cholesterol, T2DM, morbid obesity and vitamin D deficiency.   She has OSA, previously on CPAP, reports she stopped taking, now trying to lose weight and plans to get rechecked after. Husband hasn't noted any snoring since weight loss, patient denies AM fatigue or HA. 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  She  left hand carpal tunnel; tingling much improved with brace at night; she is followed by Dr. Magnus Ivan for orthopedic concerns, has been recommended will likely require surgery in the future but has been deferring for now. She reports symptoms are rare and well controlled at this time.    BMI is Body mass index is 33.37 kg/m., she has been walking 3 miles/day in a group (60 min). She has recently started cutting back on carbs and increasing protein. She has been pushing water, aiming for 65 fluid ounces daily. She is avoiding fried foods and cutting down on red meat and carbs, pushing veggies. She is taking phentermine, 1/2 tab a few days a week and doing well with this, denies side effects. Weight is down from 236 lb in 11/2018.  Goal is to get off of some medications.  Wt Readings from Last 3 Encounters:  04/30/20 194 lb 6.4 oz (88.2 kg)  04/15/20 195 lb (88.5 kg)  01/21/20 196 lb (88.9 kg)   She has known tortuous aorta per CXR 07/2016 Has isolated event of palpitations  in 2017, saw cardiology who ordered ECHO due to evidence of sarcoidosis which was normal, no recurrent episodes. She admits to not checking BP at home, does have new BP machine, today their BP is BP: 124/70  She does currently work out.  She  denies chest pain, shortness of breath, dizziness.   She is not on cholesterol medication despite recommendation due to strong patient preference. Her cholesterol is not at goal of LDL <70. The cholesterol last visit was:   Lab Results  Component Value Date   CHOL 185 01/21/2020   HDL 88 01/21/2020   LDLCALC 84 01/21/2020   TRIG 58 01/21/2020   CHOLHDL 2.1 01/21/2020    She has been working on diet and exercise for T2 diabetes on metformin 1000 mg BID, and denies increased appetite, nausea, paresthesia of the feet, polydipsia, polyuria, visual disturbances and vomitingHas glucometer but not checking since weight loss and consistent good control. Last A1C in the office was:  Lab Results  Component Value Date   HGBA1C 6.1 (H) 01/21/2020   Lab Results  Component Value Date   GFRAA 122 01/21/2020   She has non-neoplastic goiter with R lobe nodule (neg biopsy 2012) that we are monitoring, thyroid US in 12/2018 showed nodule unchanged from previous:     Lab Results  Component Value Date   TSH 0.40 01/21/2020   Patient is on Vitamin D supplement and above goal at the last check:  Lab Results  Component Value Date   VD25OH 130 (H) 01/21/2020    She reports she did stop taking for 1 month as instructed, and taking once a week down from twice weekly.   Current Medications:  Current Outpatient Medications on File Prior to Visit  Medication Sig  . aspirin EC 81 MG tablet Take 81 mg by mouth daily.    . bisoprolol-hydrochlorothiazide (ZIAC) 10-6.25 MG tablet Take 1 tablet Daily for BP  . cloNIDine (CATAPRES) 0.2 MG tablet Take 1 tablet 2 x /day for BP  . Cyanocobalamin (VITAMIN B-12 SL) Place under the tongue daily.  . diclofenac sodium (VOLTAREN) 1 % GEL Apply 2 g topically 4 (four) times daily. (Patient taking differently: Apply 2 g topically as needed. )  . fluticasone (FLONASE) 50 MCG/ACT nasal spray Place 2 sprays into both nostrils daily.  . furosemide (LASIX) 40 MG tablet TAKE 1  TABLET 2 TIMES DAILY BY MOUTH FOR BLOOD PRESSURE & FLUID  . gabapentin (NEURONTIN) 100 MG capsule Take 1 capsule 3 times a day for Pain (Patient taking differently: as needed. Take 1 capsule 3 times a day for Pain)  . hydrALAZINE (APRESOLINE) 25 MG tablet TAKE 1 TABLET (25 MG TOTAL) BY MOUTH 3 (THREE) TIMES DAILY. NO REFILLS UNTIL SEEN CALL FOR APPT (Patient taking differently: Take 25 mg by mouth 3 (three) times daily. )  . Magnesium 250 MG TABS Take 250 mg by mouth daily.  . metFORMIN (GLUCOPHAGE-XR) 500 MG 24 hr tablet Take 2 tablets 2 x /day with Meals for Diabetes  . Multiple Vitamins-Minerals (MULTIVITAMIN PO) Take by mouth daily.  Marland Kitchen olmesartan (BENICAR) 40 MG tablet Take 1 tablet Daily  for BP  & Diabetic Kidney Protection  . phentermine (ADIPEX-P) 37.5 MG tablet TAKE 1/2 TO 1 TABLET EVERY MORNING FOR DIETING & WEIGHTLOSS  . Vitamin D, Ergocalciferol, (DRISDOL) 1.25 MG (50000 UNIT) CAPS capsule TAKE ONE CAPSULE BY MOUTH ONCE WEEKLY   No current facility-administered medications on file prior to visit.     Allergies:  Allergies  Allergen Reactions  . Amlodipine Swelling  . Naproxen Hives  . Meloxicam & Diet Manage Prod Rash     Medical History:  Past Medical History:  Diagnosis Date  . Allergy   . Diabetes mellitus   . Goiter   . Hyperlipidemia   . Hypertension   . Morbid obesity ([facility]) 12/24/2013  . OSA (obstructive sleep apnea)   . Palpitations 08/01/2016  . Sarcoid   . Sarcoidosis   . Vitamin D deficiency    Family history- Reviewed and unchanged Social history- Reviewed and unchanged   Review of Systems:  Review of Systems  Constitutional: Negative for malaise/fatigue and weight loss.  HENT: Negative for hearing loss and tinnitus.   Eyes: Negative for blurred vision and double vision.  Respiratory: Negative for cough, shortness of breath and wheezing.   Cardiovascular: Negative for chest pain, palpitations, orthopnea, claudication and leg swelling.   Gastrointestinal: Negative for abdominal pain, blood in stool, constipation, diarrhea, heartburn, melena, nausea and vomiting.  Genitourinary: Negative.   Musculoskeletal: Negative for back pain, joint pain and myalgias.  Skin: Negative for rash.  Neurological: Negative for dizziness, tingling, sensory change, weakness and headaches.  Endo/Heme/Allergies: Negative for polydipsia.  Psychiatric/Behavioral: Negative.   All other systems reviewed and are negative.   Physical Exam: BP 124/70   Pulse (!) 52   Temp (!) 97.3 F (36.3 C)   Wt 194 lb 6.4 oz (88.2 kg)   SpO2 99%   BMI 33.37 kg/m  Wt Readings from Last 3 Encounters:  04/30/20 194 lb 6.4 oz (88.2 kg)  04/15/20 195 lb (88.5 kg)  01/21/20 196 lb (88.9 kg)   General Appearance: Well nourished, in no apparent distress. Eyes: PERRLA, EOMs, conjunctiva no swelling or erythema Sinuses: No Frontal/maxillary tenderness ENT/Mouth: Ext aud canals clear, TMs without erythema, bulging. No erythema, swelling, or exudate on post pharynx.  Tonsils not swollen or erythematous. Hearing normal.  Neck: Supple, thyroid normal.  Respiratory: Respiratory effort normal, BS equal bilaterally without rales, rhonchi, wheezing or stridor.  Cardio: RRR with no MRGs. Brisk peripheral pulses without edema.  Abdomen: Soft, + BS.  Non tender, no guarding, rebound, hernias, masses. Lymphatics: Non tender without lymphadenopathy.  Musculoskeletal: Full ROM, 5/5 strength, Normal gait.  Skin: Warm, dry without rashes, lesions, ecchymosis.  Neuro: Cranial nerves intact. No cerebellar symptoms.  Psych: Awake and oriented X 3, normal affect, Insight and Judgment appropriate.    Izora Ribas, NP 8:59 AM [facility]

## 2020-04-30 ENCOUNTER — Ambulatory Visit: Payer: BC Managed Care – PPO | Admitting: Adult Health

## 2020-04-30 ENCOUNTER — Other Ambulatory Visit: Payer: Self-pay

## 2020-04-30 ENCOUNTER — Encounter: Payer: Self-pay | Admitting: Adult Health

## 2020-04-30 VITALS — BP 124/70 | HR 52 | Temp 97.3°F | Wt 194.4 lb

## 2020-04-30 DIAGNOSIS — E1169 Type 2 diabetes mellitus with other specified complication: Secondary | ICD-10-CM | POA: Diagnosis not present

## 2020-04-30 DIAGNOSIS — I771 Stricture of artery: Secondary | ICD-10-CM

## 2020-04-30 DIAGNOSIS — Z5320 Procedure and treatment not carried out because of patient's decision for unspecified reasons: Secondary | ICD-10-CM

## 2020-04-30 DIAGNOSIS — I10 Essential (primary) hypertension: Secondary | ICD-10-CM

## 2020-04-30 DIAGNOSIS — Z79899 Other long term (current) drug therapy: Secondary | ICD-10-CM

## 2020-04-30 DIAGNOSIS — E662 Morbid (severe) obesity with alveolar hypoventilation: Secondary | ICD-10-CM | POA: Diagnosis not present

## 2020-04-30 DIAGNOSIS — E049 Nontoxic goiter, unspecified: Secondary | ICD-10-CM | POA: Diagnosis not present

## 2020-04-30 DIAGNOSIS — E559 Vitamin D deficiency, unspecified: Secondary | ICD-10-CM

## 2020-04-30 DIAGNOSIS — E785 Hyperlipidemia, unspecified: Secondary | ICD-10-CM | POA: Diagnosis not present

## 2020-04-30 DIAGNOSIS — E66811 Obesity, class 1: Secondary | ICD-10-CM

## 2020-04-30 DIAGNOSIS — E669 Obesity, unspecified: Secondary | ICD-10-CM

## 2020-04-30 NOTE — Patient Instructions (Signed)
Goals    . Blood Pressure < 130/80    . HEMOGLOBIN A1C < 5.7    . LDL CALC < 70    . Weight (lb) < 185 lb (83.9 kg)        A great goal to work towards is aiming to get in a serving daily of some of the most nutritionally dense foods - G- BOMBS daily         High-Fiber Diet Fiber, also called dietary fiber, is a type of carbohydrate that is found in fruits, vegetables, whole grains, and beans. A high-fiber diet can have many health benefits. Your health care provider may recommend a high-fiber diet to help:  Prevent constipation. Fiber can make your bowel movements more regular.  Lower your cholesterol.  Relieve the following conditions: ? Swelling of veins in the anus (hemorrhoids). ? Swelling and irritation (inflammation) of specific areas of the digestive tract (uncomplicated diverticulosis). ? A problem of the large intestine (colon) that sometimes causes pain and diarrhea (irritable bowel syndrome, IBS).  Prevent overeating as part of a weight-loss plan.  Prevent heart disease, type 2 diabetes, and certain cancers. What is my plan? The recommended daily fiber intake in grams (g) includes:  38 g for men age 58 or younger.  30 g for men over age 94.  25 g for women age 80 or younger.  21 g for women over age 16. You can get the recommended daily intake of dietary fiber by:  Eating a variety of fruits, vegetables, grains, and beans.  Taking a fiber supplement, if it is not possible to get enough fiber through your diet. What do I need to know about a high-fiber diet?  It is better to get fiber through food sources rather than from fiber supplements. There is not a lot of research about how effective supplements are.  Always check the fiber content on the nutrition facts label of any prepackaged food. Look for foods that contain 5 g of fiber or more per serving.  Talk with a diet and nutrition specialist (dietitian) if you have questions about specific foods that  are recommended or not recommended for your medical condition, especially if those foods are not listed below.  Gradually increase how much fiber you consume. If you increase your intake of dietary fiber too quickly, you may have bloating, cramping, or gas.  Drink plenty of water. Water helps you to digest fiber. What are tips for following this plan?  Eat a wide variety of high-fiber foods.  Make sure that half of the grains that you eat each day are whole grains.  Eat breads and cereals that are made with whole-grain flour instead of refined flour or white flour.  Eat brown rice, bulgur wheat, or millet instead of white rice.  Start the day with a breakfast that is high in fiber, such as a cereal that contains 5 g of fiber or more per serving.  Use beans in place of meat in soups, salads, and pasta dishes.  Eat high-fiber snacks, such as berries, raw vegetables, nuts, and popcorn.  Choose whole fruits and vegetables instead of processed forms like juice or sauce. What foods can I eat?  Fruits Berries. Pears. Apples. Oranges. Avocado. Prunes and raisins. Dried figs. Vegetables Sweet potatoes. Spinach. Kale. Artichokes. Cabbage. Broccoli. Cauliflower. Green peas. Carrots. Squash. Grains Whole-grain breads. Multigrain cereal. Oats and oatmeal. Brown rice. Barley. Bulgur wheat. Millet. Quinoa. Bran muffins. Popcorn. Rye wafer crackers. Meats and other proteins [employer],  kidney, and pinto beans. Soybeans. Split peas. Lentils. Nuts and seeds. Dairy Fiber-fortified yogurt. Beverages Fiber-fortified soy milk. Fiber-fortified orange juice. Other foods Fiber bars. The items listed above may not be a complete list of recommended foods and beverages. Contact a dietitian for more options. What foods are not recommended? Fruits Fruit juice. Cooked, strained fruit. Vegetables Fried potatoes. Canned vegetables. Well-cooked vegetables. Grains White bread. Pasta made with refined flour.  White rice. Meats and other proteins Fatty cuts of meat. Fried chicken or fried fish. Dairy Milk. Yogurt. Cream cheese. Sour cream. Fats and oils Butters. Beverages Soft drinks. Other foods Cakes and pastries. The items listed above may not be a complete list of foods and beverages to avoid. Contact a dietitian for more information. Summary  Fiber is a type of carbohydrate. It is found in fruits, vegetables, whole grains, and beans.  There are many health benefits of eating a high-fiber diet, such as preventing constipation, lowering blood cholesterol, helping with weight loss, and reducing your risk of heart disease, diabetes, and certain cancers.  Gradually increase your intake of fiber. Increasing too fast can result in cramping, bloating, and gas. Drink plenty of water while you increase your fiber.  The best sources of fiber include whole fruits and vegetables, whole grains, nuts, seeds, and beans. This information is not intended to replace advice given to you by your health care provider. Make sure you discuss any questions you have with your health care provider. Document Revised: 08/22/2017 Document Reviewed: 08/22/2017 Elsevier Patient Education  2020 [employer].

## 2020-05-01 ENCOUNTER — Other Ambulatory Visit: Payer: Self-pay | Admitting: Adult Health

## 2020-05-01 LAB — CBC WITH DIFFERENTIAL/PLATELET
Absolute Monocytes: 321 cells/uL (ref 200–950)
Basophils Absolute: 41 cells/uL (ref 0–200)
Basophils Relative: 0.8 %
Eosinophils Absolute: 51 cells/uL (ref 15–500)
Eosinophils Relative: 1 %
HCT: 36.7 % (ref 35.0–45.0)
Hemoglobin: 11.8 g/dL (ref 11.7–15.5)
Lymphs Abs: 2606 cells/uL (ref 850–3900)
MCH: 25.9 pg — ABNORMAL LOW (ref 27.0–33.0)
MCHC: 32.2 g/dL (ref 32.0–36.0)
MCV: 80.7 fL (ref 80.0–100.0)
MPV: 11.2 fL (ref 7.5–12.5)
Monocytes Relative: 6.3 %
Neutro Abs: 2081 cells/uL (ref 1500–7800)
Neutrophils Relative %: 40.8 %
Platelets: 362 10*3/uL (ref 140–400)
RBC: 4.55 10*6/uL (ref 3.80–5.10)
RDW: 14.2 % (ref 11.0–15.0)
Total Lymphocyte: 51.1 %
WBC: 5.1 10*3/uL (ref 3.8–10.8)

## 2020-05-01 LAB — COMPLETE METABOLIC PANEL WITH GFR
AG Ratio: 1.6 (calc) (ref 1.0–2.5)
ALT: 10 U/L (ref 6–29)
AST: 15 U/L (ref 10–35)
Albumin: 4.2 g/dL (ref 3.6–5.1)
Alkaline phosphatase (APISO): 54 U/L (ref 37–153)
BUN: 19 mg/dL (ref 7–25)
CO2: 33 mmol/L — ABNORMAL HIGH (ref 20–32)
Calcium: 9.6 mg/dL (ref 8.6–10.4)
Chloride: 100 mmol/L (ref 98–110)
Creat: 0.66 mg/dL (ref 0.50–0.99)
GFR, Est African American: 111 mL/min/{1.73_m2} (ref >=60)
GFR, Est Non African American: 96 mL/min/{1.73_m2} (ref >=60)
Globulin: 2.7 g/dL (calc) (ref 1.9–3.7)
Glucose, Bld: 127 mg/dL — ABNORMAL HIGH (ref 65–99)
Potassium: 4.3 mmol/L (ref 3.5–5.3)
Sodium: 139 mmol/L (ref 135–146)
Total Bilirubin: 0.5 mg/dL (ref 0.2–1.2)
Total Protein: 6.9 g/dL (ref 6.1–8.1)

## 2020-05-01 LAB — LIPID PANEL
Cholesterol: 189 mg/dL (ref <200)
HDL: 98 mg/dL (ref >=50)
LDL Cholesterol (Calc): 77 mg/dL (calc)
Non-HDL Cholesterol (Calc): 91 mg/dL (calc) (ref <130)
Total CHOL/HDL Ratio: 1.9 (calc) (ref <5.0)
Triglycerides: 63 mg/dL (ref <150)

## 2020-05-01 LAB — HEMOGLOBIN A1C
Hgb A1c MFr Bld: 6.2 % of total Hgb — ABNORMAL HIGH (ref <5.7)
Mean Plasma Glucose: 131 (calc)
eAG (mmol/L): 7.3 (calc)

## 2020-05-01 LAB — TSH: TSH: 0.49 mIU/L (ref 0.40–4.50)

## 2020-05-01 LAB — VITAMIN D 25 HYDROXY (VIT D DEFICIENCY, FRACTURES): Vit D, 25-Hydroxy: 124 ng/mL — ABNORMAL HIGH (ref 30–100)

## 2020-05-01 LAB — MAGNESIUM: Magnesium: 2 mg/dL (ref 1.5–2.5)

## 2020-05-01 MED ORDER — VITAMIN D (ERGOCALCIFEROL) 1.25 MG (50000 UNIT) PO CAPS: ORAL_CAPSULE | ORAL | 0 refills | Status: DC

## 2020-05-13 ENCOUNTER — Other Ambulatory Visit: Payer: Self-pay | Admitting: Adult Health

## 2020-05-19 ENCOUNTER — Other Ambulatory Visit: Payer: Self-pay | Admitting: Internal Medicine

## 2020-05-23 ENCOUNTER — Other Ambulatory Visit: Payer: Self-pay | Admitting: Adult Health

## 2020-06-26 ENCOUNTER — Other Ambulatory Visit: Payer: Self-pay | Admitting: Internal Medicine

## 2020-07-15 ENCOUNTER — Other Ambulatory Visit: Payer: Self-pay | Admitting: Internal Medicine

## 2020-07-15 ENCOUNTER — Other Ambulatory Visit: Payer: Self-pay | Admitting: Adult Health

## 2020-07-17 ENCOUNTER — Encounter: Payer: BLUE CROSS/BLUE SHIELD | Admitting: Adult Health

## 2020-07-19 DIAGNOSIS — R109 Unspecified abdominal pain: Secondary | ICD-10-CM | POA: Diagnosis not present

## 2020-07-19 DIAGNOSIS — R8271 Bacteriuria: Secondary | ICD-10-CM | POA: Diagnosis not present

## 2020-07-21 ENCOUNTER — Telehealth: Payer: Self-pay

## 2020-07-21 NOTE — Telephone Encounter (Signed)
Patient is scheduled for appointment with Kyra on 07/22/20

## 2020-07-21 NOTE — Telephone Encounter (Signed)
Went to urgent care for abdominal cramping and bloating, was instructed to take OTC miralax and not any better. Has even used an enema without success.  Also being treated with antibiotics for a bladder infection.

## 2020-07-22 ENCOUNTER — Ambulatory Visit
Admission: RE | Admit: 2020-07-22 | Discharge: 2020-07-22 | Disposition: A | Discharge status: Home / Self Care | Payer: BC Managed Care – PPO | Source: Ambulatory Visit | Attending: Adult Health Nurse Practitioner | Admitting: Adult Health Nurse Practitioner

## 2020-07-22 ENCOUNTER — Other Ambulatory Visit: Payer: Self-pay

## 2020-07-22 ENCOUNTER — Encounter: Payer: Self-pay | Admitting: Adult Health Nurse Practitioner

## 2020-07-22 ENCOUNTER — Ambulatory Visit: Payer: BC Managed Care – PPO | Admitting: Adult Health Nurse Practitioner

## 2020-07-22 VITALS — BP 124/74 | HR 109 | Temp 96.6°F | Wt 192.0 lb

## 2020-07-22 DIAGNOSIS — I10 Essential (primary) hypertension: Secondary | ICD-10-CM

## 2020-07-22 DIAGNOSIS — K5900 Constipation, unspecified: Secondary | ICD-10-CM

## 2020-07-22 DIAGNOSIS — R1084 Generalized abdominal pain: Secondary | ICD-10-CM

## 2020-07-22 DIAGNOSIS — K6389 Other specified diseases of intestine: Secondary | ICD-10-CM | POA: Diagnosis not present

## 2020-07-22 NOTE — Progress Notes (Signed)
Assessment and Plan: Tanya [name] was seen today for abdominal pain.  Diagnoses and all orders for this visit:  Generalized abdominal pain -     DG Abd 2 Views; Future Discussed multiple etiologies Continue to monitor symptoms Discussed hospital precautions with patient Continue hydration  Constipation, unspecified constipation type Discussed magnesium citrate pending xray results.  Essential hypertension Continue current medications: Monitor blood pressure at home; call if consistently over 130/80 Continue DASH diet.   Reminder to go to the ER if any CP, SOB, nausea, dizziness, severe HA, changes vision/speech, left arm numbness and tingling and jaw pain.    Patient agrees with plan of care and to contact office with any new or worsening symptoms.    Further disposition pending results of labs. Discussed med's effects and SE's.   Over 30 minutes of face to face interview, exam, counseling, chart review, and critical decision making was performed.   Future Appointments  Date Time Provider Department Center  07/31/2020  9:00 AM Tanya Gaudier, NP [facility] None    ------------------------------------------------------------------------------------------------------------------   HPI 59 y.o.female presents for abdominal pains.  She reports that four days ago she went to urgent care.  She was diagnoses with urinary tract infection, Cipro Q12 hours for 10 days.  She is tolerating the medication.  She reports that her appetite has decreased because when she eat she feels more crampy.  She did take two doses of miralax but has not had a bowel movement.  She did have some flatulance today as well as some burping.  She did try an enema on Sunday along with prune juice and she did not have a bowel movement.  She has not had solid food for the past three days.  Denies any nausea or vomiting.   Reports she feels weak because she has not be eating.  Denies any nasuea, vomiting, hemoptysis,  hematochezia or melena.    Past Medical History:  Diagnosis Date   Allergy    Diabetes mellitus    Goiter    Hyperlipidemia    Hypertension    Morbid obesity ([facility]) 12/24/2013   OSA (obstructive sleep apnea)    Palpitations 08/01/2016   Sarcoid    Sarcoidosis    Vitamin D deficiency      Allergies  Allergen Reactions   Amlodipine Swelling   Naproxen Hives   Meloxicam & Diet Manage Prod Rash    No current facility-administered medications on file prior to visit.   Current Outpatient Medications on File Prior to Visit  Medication Sig   aspirin EC 81 MG tablet Take 81 mg by mouth daily.     bisoprolol-hydrochlorothiazide (ZIAC) 10-6.25 MG tablet TAKE 1 TABLET DAILY FOR BLOOD PRESSURE   cloNIDine (CATAPRES) 0.2 MG tablet TAKE 1 TABLET BY MOUTH TWICE A DAY FOR BLOOD PRESSURE   Cyanocobalamin (VITAMIN B-12 SL) Place under the tongue daily.   diclofenac sodium (VOLTAREN) 1 % GEL Apply 2 g topically 4 (four) times daily. (Patient taking differently: Apply 2 g topically as needed. )   fluticasone (FLONASE) 50 MCG/ACT nasal spray Place 2 sprays into both nostrils daily.   furosemide (LASIX) 40 MG tablet Take 1 tablet    2 x /day    for BP  and Fluid Retention / Ankle Swelling   gabapentin (NEURONTIN) 100 MG capsule Take 1 capsule 3 times a day for Pain (Patient taking differently: as needed. Take 1 capsule 3 times a day for Pain)   hydrALAZINE (APRESOLINE) 25 MG tablet Take 1 tablet (  25 mg total) by mouth 3 (three) times daily.   Magnesium 250 MG TABS Take 250 mg by mouth daily.   metFORMIN (GLUCOPHAGE-XR) 500 MG 24 hr tablet Take 2 tablets 2 x /day with Meals for Diabetes   Multiple Vitamins-Minerals (MULTIVITAMIN PO) Take by mouth daily.   olmesartan (BENICAR) 40 MG tablet TAKE 1 TABLET DAILY FOR BP & DIABETIC KIDNEY PROTECTION   Vitamin D, Ergocalciferol, (DRISDOL) 1.25 MG (50000 UNIT) CAPS capsule Take 1 capsule 2 x /week for Severe Vitamin D Deficiency    phentermine (ADIPEX-P) 37.5 MG tablet TAKE 1/2 TO 1 TABLET EVERY MORNING FOR DIETING & WEIGHTLOSS (Patient not taking: Reported on 07/22/2020)    ROS: all negative except above.   Physical Exam:  BP 124/74    Pulse (!) 109    Temp (!) 96.6 F (35.9 C)    Wt 192 lb (87.1 kg)    SpO2 98%    BMI 32.96 kg/m   General Appearance: Well nourished, in no apparent distress. Eyes: PERRLA, EOMs, conjunctiva no swelling or erythema Sinuses: No Frontal/maxillary tenderness ENT/Mouth: Ext aud canals clear, TMs without erythema, bulging. No erythema, swelling, or exudate on post pharynx.  Tonsils not swollen or erythematous. Hearing normal.  Neck: Supple, thyroid normal.  Respiratory: Respiratory effort normal, BS equal bilaterally without rales, rhonchi, wheezing or stridor.  Cardio: RRR with no MRGs. Brisk peripheral pulses without edema.  Abdomen: Soft, distended, hyperactive bowel sounds bilateral upper quadrants, hypoactive BS Bilateral LQ.  Tender epigastric, generalized tenderness in all quadrants, no guarding or rebound tenderness noted, hernias, masses. Lymphatics: Non tender without lymphadenopathy.  Musculoskeletal: Full ROM, 5/5 strength, normal gait.  Skin: Warm, dry without rashes, lesions, ecchymosis.  Neuro: Cranial nerves intact. Normal muscle tone, no cerebellar symptoms. Sensation intact.  Psych: Awake and oriented X 3, normal affect, Insight and Judgment appropriate.     Elder Negus, NP  12:46 PM [facility]

## 2020-07-23 ENCOUNTER — Emergency Department (HOSPITAL_COMMUNITY): Payer: BC Managed Care – PPO

## 2020-07-23 ENCOUNTER — Inpatient Hospital Stay (HOSPITAL_COMMUNITY): Payer: BC Managed Care – PPO

## 2020-07-23 ENCOUNTER — Inpatient Hospital Stay (HOSPITAL_COMMUNITY)
Admission: EM | Admit: 2020-07-23 | Discharge: 2020-08-01 | DRG: 330 | Disposition: A | Discharge status: Home / Self Care | Payer: BC Managed Care – PPO | Attending: Internal Medicine | Admitting: Internal Medicine

## 2020-07-23 ENCOUNTER — Encounter (HOSPITAL_COMMUNITY): Payer: Self-pay | Admitting: Emergency Medicine

## 2020-07-23 ENCOUNTER — Other Ambulatory Visit: Payer: Self-pay

## 2020-07-23 DIAGNOSIS — I493 Ventricular premature depolarization: Secondary | ICD-10-CM | POA: Diagnosis not present

## 2020-07-23 DIAGNOSIS — Z7984 Long term (current) use of oral hypoglycemic drugs: Secondary | ICD-10-CM | POA: Diagnosis not present

## 2020-07-23 DIAGNOSIS — E1169 Type 2 diabetes mellitus with other specified complication: Secondary | ICD-10-CM | POA: Diagnosis not present

## 2020-07-23 DIAGNOSIS — Z20822 Contact with and (suspected) exposure to covid-19: Secondary | ICD-10-CM | POA: Diagnosis present

## 2020-07-23 DIAGNOSIS — Z8249 Family history of ischemic heart disease and other diseases of the circulatory system: Secondary | ICD-10-CM

## 2020-07-23 DIAGNOSIS — E785 Hyperlipidemia, unspecified: Secondary | ICD-10-CM | POA: Diagnosis present

## 2020-07-23 DIAGNOSIS — K551 Chronic vascular disorders of intestine: Secondary | ICD-10-CM | POA: Diagnosis present

## 2020-07-23 DIAGNOSIS — K6389 Other specified diseases of intestine: Secondary | ICD-10-CM | POA: Diagnosis not present

## 2020-07-23 DIAGNOSIS — Z7982 Long term (current) use of aspirin: Secondary | ICD-10-CM | POA: Diagnosis not present

## 2020-07-23 DIAGNOSIS — K922 Gastrointestinal hemorrhage, unspecified: Secondary | ICD-10-CM | POA: Diagnosis not present

## 2020-07-23 DIAGNOSIS — K565 Intestinal adhesions [bands], unspecified as to partial versus complete obstruction: Principal | ICD-10-CM | POA: Diagnosis present

## 2020-07-23 DIAGNOSIS — K5651 Intestinal adhesions [bands], with partial obstruction: Secondary | ICD-10-CM | POA: Diagnosis not present

## 2020-07-23 DIAGNOSIS — E669 Obesity, unspecified: Secondary | ICD-10-CM | POA: Diagnosis present

## 2020-07-23 DIAGNOSIS — I10 Essential (primary) hypertension: Secondary | ICD-10-CM | POA: Diagnosis present

## 2020-07-23 DIAGNOSIS — G4733 Obstructive sleep apnea (adult) (pediatric): Secondary | ICD-10-CM | POA: Diagnosis not present

## 2020-07-23 DIAGNOSIS — Z833 Family history of diabetes mellitus: Secondary | ICD-10-CM | POA: Diagnosis not present

## 2020-07-23 DIAGNOSIS — I878 Other specified disorders of veins: Secondary | ICD-10-CM | POA: Diagnosis not present

## 2020-07-23 DIAGNOSIS — Z4682 Encounter for fitting and adjustment of non-vascular catheter: Secondary | ICD-10-CM | POA: Diagnosis not present

## 2020-07-23 DIAGNOSIS — Z9049 Acquired absence of other specified parts of digestive tract: Secondary | ICD-10-CM | POA: Diagnosis not present

## 2020-07-23 DIAGNOSIS — K56609 Unspecified intestinal obstruction, unspecified as to partial versus complete obstruction: Secondary | ICD-10-CM | POA: Diagnosis not present

## 2020-07-23 DIAGNOSIS — E119 Type 2 diabetes mellitus without complications: Secondary | ICD-10-CM | POA: Diagnosis not present

## 2020-07-23 DIAGNOSIS — Z9889 Other specified postprocedural states: Secondary | ICD-10-CM | POA: Diagnosis not present

## 2020-07-23 DIAGNOSIS — R8271 Bacteriuria: Secondary | ICD-10-CM | POA: Diagnosis present

## 2020-07-23 DIAGNOSIS — Z6831 Body mass index (BMI) 31.0-31.9, adult: Secondary | ICD-10-CM | POA: Diagnosis not present

## 2020-07-23 DIAGNOSIS — D869 Sarcoidosis, unspecified: Secondary | ICD-10-CM | POA: Diagnosis not present

## 2020-07-23 DIAGNOSIS — Z0189 Encounter for other specified special examinations: Secondary | ICD-10-CM

## 2020-07-23 DIAGNOSIS — E559 Vitamin D deficiency, unspecified: Secondary | ICD-10-CM | POA: Diagnosis not present

## 2020-07-23 DIAGNOSIS — Z79899 Other long term (current) drug therapy: Secondary | ICD-10-CM | POA: Diagnosis not present

## 2020-07-23 DIAGNOSIS — Z6830 Body mass index (BMI) 30.0-30.9, adult: Secondary | ICD-10-CM | POA: Diagnosis not present

## 2020-07-23 DIAGNOSIS — Z90711 Acquired absence of uterus with remaining cervical stump: Secondary | ICD-10-CM | POA: Diagnosis not present

## 2020-07-23 HISTORY — DX: Family history of other specified conditions: Z84.89

## 2020-07-23 LAB — URINALYSIS, ROUTINE W REFLEX MICROSCOPIC
Bilirubin Urine: NEGATIVE
Glucose, UA: 50 mg/dL — AB
Hgb urine dipstick: NEGATIVE
Ketones, ur: 20 mg/dL — AB
Leukocytes,Ua: NEGATIVE
Nitrite: NEGATIVE
Protein, ur: 100 mg/dL — AB
Specific Gravity, Urine: 1.026 (ref 1.005–1.030)
pH: 5 (ref 5.0–8.0)

## 2020-07-23 LAB — COMPREHENSIVE METABOLIC PANEL
ALT: 10 U/L (ref 0–44)
AST: 16 U/L (ref 15–41)
Albumin: 3.6 g/dL (ref 3.5–5.0)
Alkaline Phosphatase: 39 U/L (ref 38–126)
Anion gap: 16 — ABNORMAL HIGH (ref 5–15)
BUN: 18 mg/dL (ref 6–20)
CO2: 27 mmol/L (ref 22–32)
Calcium: 9 mg/dL (ref 8.9–10.3)
Chloride: 90 mmol/L — ABNORMAL LOW (ref 98–111)
Creatinine, Ser: 0.7 mg/dL (ref 0.44–1.00)
GFR calc Af Amer: >60 mL/min (ref >60)
GFR calc non Af Amer: >60 mL/min (ref >60)
Glucose, Bld: 178 mg/dL — ABNORMAL HIGH (ref 70–99)
Potassium: 3.5 mmol/L (ref 3.5–5.1)
Sodium: 133 mmol/L — ABNORMAL LOW (ref 135–145)
Total Bilirubin: 1 mg/dL (ref 0.3–1.2)
Total Protein: 6.9 g/dL (ref 6.5–8.1)

## 2020-07-23 LAB — CBC
HCT: 42.3 % (ref 36.0–46.0)
Hemoglobin: 13.8 g/dL (ref 12.0–15.0)
MCH: 25.4 pg — ABNORMAL LOW (ref 26.0–34.0)
MCHC: 32.6 g/dL (ref 30.0–36.0)
MCV: 77.9 fL — ABNORMAL LOW (ref 80.0–100.0)
Platelets: 436 10*3/uL — ABNORMAL HIGH (ref 150–400)
RBC: 5.43 MIL/uL — ABNORMAL HIGH (ref 3.87–5.11)
RDW: 14.5 % (ref 11.5–15.5)
WBC: 8.6 10*3/uL (ref 4.0–10.5)
nRBC: 0 % (ref 0.0–0.2)

## 2020-07-23 LAB — SARS CORONAVIRUS 2 BY RT PCR (HOSPITAL ORDER, PERFORMED IN ~~LOC~~ HOSPITAL LAB): SARS Coronavirus 2: NEGATIVE

## 2020-07-23 LAB — PREALBUMIN: Prealbumin: 12.1 mg/dL — ABNORMAL LOW (ref 18–38)

## 2020-07-23 LAB — CBG MONITORING, ED: Glucose-Capillary: 163 mg/dL — ABNORMAL HIGH (ref 70–99)

## 2020-07-23 LAB — LIPASE, BLOOD: Lipase: 19 U/L (ref 11–51)

## 2020-07-23 LAB — HEMOGLOBIN A1C
Hgb A1c MFr Bld: 6.3 % — ABNORMAL HIGH (ref 4.8–5.6)
Mean Plasma Glucose: 134.11 mg/dL

## 2020-07-23 LAB — HIV ANTIBODY (ROUTINE TESTING W REFLEX): HIV Screen 4th Generation wRfx: NONREACTIVE

## 2020-07-23 MED ORDER — ONDANSETRON HCL 4 MG/2ML IJ SOLN: 4.0000 mg | Freq: Four times a day (QID) | INTRAMUSCULAR | Status: DC | PRN

## 2020-07-23 MED ORDER — ACETAMINOPHEN 650 MG RE SUPP: 650.0000 mg | Freq: Four times a day (QID) | RECTAL | Status: DC | PRN

## 2020-07-23 MED ORDER — DIATRIZOATE MEGLUMINE & SODIUM 66-10 % PO SOLN
90.0000 mL | Freq: Once | ORAL | Status: DC
  Filled 2020-07-23: qty 90

## 2020-07-23 MED ORDER — SODIUM CHLORIDE 0.9% FLUSH
3.0000 mL | Freq: Two times a day (BID) | INTRAVENOUS | Status: DC
  Administered 2020-07-23 – 2020-08-01 (×10): 3 mL via INTRAVENOUS

## 2020-07-23 MED ORDER — ONDANSETRON HCL 4 MG PO TABS: 4.0000 mg | ORAL_TABLET | Freq: Four times a day (QID) | ORAL | Status: DC | PRN

## 2020-07-23 MED ORDER — ACETAMINOPHEN 325 MG PO TABS: 650.0000 mg | ORAL_TABLET | Freq: Four times a day (QID) | ORAL | Status: DC | PRN

## 2020-07-23 MED ORDER — LABETALOL HCL 5 MG/ML IV SOLN
10.0000 mg | Freq: Once | INTRAVENOUS | Status: AC | PRN
  Administered 2020-07-24: 10 mg via INTRAVENOUS
  Filled 2020-07-23: qty 4

## 2020-07-23 MED ORDER — LABETALOL HCL 5 MG/ML IV SOLN: 10.0000 mg | Freq: Once | INTRAVENOUS | Status: DC

## 2020-07-23 MED ORDER — INSULIN ASPART 100 UNIT/ML ~~LOC~~ SOLN
0.0000 [IU] | SUBCUTANEOUS | Status: DC
  Administered 2020-07-24 – 2020-07-25 (×4): 1 [IU] via SUBCUTANEOUS
  Administered 2020-07-27 (×2): 2 [IU] via SUBCUTANEOUS
  Administered 2020-07-27: 1 [IU] via SUBCUTANEOUS
  Administered 2020-07-28: 2 [IU] via SUBCUTANEOUS
  Administered 2020-07-28: 1 [IU] via SUBCUTANEOUS
  Administered 2020-07-28 (×2): 2 [IU] via SUBCUTANEOUS
  Administered 2020-07-28 – 2020-08-01 (×10): 1 [IU] via SUBCUTANEOUS

## 2020-07-23 MED ORDER — LORAZEPAM 2 MG/ML IJ SOLN
0.5000 mg | Freq: Once | INTRAMUSCULAR | Status: AC
  Administered 2020-07-23: 0.5 mg via INTRAVENOUS
  Filled 2020-07-23: qty 1

## 2020-07-23 MED ORDER — LABETALOL HCL 5 MG/ML IV SOLN: 5.0000 mg | Freq: Once | INTRAVENOUS | Status: DC

## 2020-07-23 MED ORDER — CLONIDINE HCL 0.3 MG/24HR TD PTWK
0.3000 mg | MEDICATED_PATCH | TRANSDERMAL | Status: DC
  Filled 2020-07-23: qty 1

## 2020-07-23 MED ORDER — ENOXAPARIN SODIUM 40 MG/0.4ML ~~LOC~~ SOLN
40.0000 mg | SUBCUTANEOUS | Status: DC
  Administered 2020-07-23 – 2020-07-26 (×4): 40 mg via SUBCUTANEOUS
  Filled 2020-07-23 (×4): qty 0.4

## 2020-07-23 MED ORDER — SODIUM CHLORIDE 0.9 % IV BOLUS
1000.0000 mL | Freq: Once | INTRAVENOUS | Status: AC
  Administered 2020-07-23: 1000 mL via INTRAVENOUS

## 2020-07-23 MED ORDER — IOHEXOL 300 MG/ML  SOLN
100.0000 mL | Freq: Once | INTRAMUSCULAR | Status: AC | PRN
  Administered 2020-07-23: 100 mL via INTRAVENOUS

## 2020-07-23 NOTE — Hospital Course (Addendum)
Admitted 07/23/2020  Allergies: Amlodipine, Naproxen, and Meloxicam & diet manage prod Pertinent Hx: HTN, HLD, T2DM  59 y.o. female p/w abdominal pain and no BM x 1 week  * SBO: Likely secondary to adhesion. No prior Hx of sob. S/p 1 li NS. Surgery consulted> conservative management w NG tube and SBO protocol. NPO.   Consults: Gen surg  Meds: clonidine patch, zofran, VTE ppx: Lovenox IVF: s/p NS Diet: npo  Home meds:  ASA, bisoprolol-HCTZ 10-6.25 mg daily, ciprofloxacin, clonidine 0.2 mg twice daily, vitamin B12, Lasix 40 mg twice daily, gabapentin, hydralazine 25 mg 3 times daily, magnesium, Metformin  UA keton, Pr, rare bacteria, WBC 11-20 BG 176, AG 16  Bp143/86,161/72 HR 78<110 T98 COVID pending Cr 0.7

## 2020-07-23 NOTE — ED Provider Notes (Signed)
[facility] EMERGENCY DEPARTMENT Provider Note   CSN: [id number] Arrival date & time: 07/23/20  0827     History Chief Complaint  Patient presents with  . Abdominal Pain    Tanya [name] is a 59 y.o. female.  HPI 59 year old female presents with possible small bowel obstruction.  She has been having abdominal bloating and pain and cramping since 9/18.  A couple times she has vomited when eating.  No diarrhea and no bowel output for 1 week.  She has had minimal gas.  Went to urgent care when it started and was told she was constipated and had a UTI.  She has no urinary symptoms.  Went to her doctor yesterday where an x-ray was performed and she was called this morning to come get a CT scan as x-ray showed possible SBO.   Past Medical History:  Diagnosis Date  . Allergy   . Diabetes mellitus   . Goiter   . Hyperlipidemia   . Hypertension   . Morbid obesity (HCC) 12/24/2013  . OSA (obstructive sleep apnea)   . Palpitations 08/01/2016  . Sarcoid   . Sarcoidosis   . Vitamin D deficiency     Patient Active Problem List   Diagnosis Date Noted  . Carpal tunnel syndrome, left upper limb 12/25/2018  . Tortuous aorta (HCC) 05/20/2017  . Obesity (BMI 30.0-34.9) 12/24/2013  . Hypertension   . Hyperlipidemia associated with type 2 diabetes mellitus (HCC)   . Type 2 diabetes mellitus with obesity (HCC)   . Environmental allergies   . Obesity hypoventilation syndrome (HCC)    . Goiter   . Vitamin D deficiency     Past Surgical History:  Procedure Laterality Date  . ABDOMINAL HYSTERECTOMY    . achiles tendon     right  . EYE SURGERY     biopsy at [facility]  . TONSILLECTOMY       OB History   No obstetric history on file.     Family History  Problem Relation Age of Onset  . Hypertension Mother   . Diabetes Father   . Heart disease Father   . Diabetes Paternal Grandmother   . Heart disease Paternal Grandmother   . Hypertension Sister   . Heart  disease Sister   . Hypertension Brother   . Colon cancer Neg Hx   . Esophageal cancer Neg Hx   . Rectal cancer Neg Hx   . Stomach cancer Neg Hx   . Breast cancer Neg Hx     Social History   Tobacco Use  . Smoking status: Never Smoker  . Smokeless tobacco: Never Used  Vaping Use  . Vaping Use: Never used  Substance Use Topics  . Alcohol use: Yes    Comment: rare glass of wine  . Drug use: No    Home Medications Prior to Admission medications   Medication Sig Start Date End Date Taking? Authorizing Provider  aspirin EC 81 MG tablet Take 81 mg by mouth daily.     Yes [provider]  bisoprolol-hydrochlorothiazide (ZIAC) 10-6.25 MG tablet TAKE 1 TABLET DAILY FOR BLOOD PRESSURE Patient taking differently: Take 1 tablet by mouth daily. TAKE 1 TABLET DAILY FOR BLOOD PRESSURE 07/15/20  Yes Lucky Cowboy, MD  ciprofloxacin (CIPRO) 500 MG tablet Take 500 mg by mouth in the morning and at bedtime. 07/19/20  Yes [provider]  cloNIDine (CATAPRES) 0.2 MG tablet TAKE 1 TABLET BY MOUTH TWICE A DAY FOR BLOOD PRESSURE  Patient taking differently: Take 0.2 mg by mouth 2 (two) times daily. TAKE 1 TABLET BY MOUTH TWICE A DAY FOR BLOOD PRESSURE 06/26/20  Yes Judd Gaudier, NP  Cyanocobalamin (VITAMIN B-12 SL) Place 1 tablet under the tongue daily.    Yes [provider]  diclofenac sodium (VOLTAREN) 1 % GEL Apply 2 g topically 4 (four) times daily. Patient taking differently: Apply 2 g topically 4 (four) times daily as needed (pain).  11/02/16  Yes Kathryne Hitch, MD  fluticasone (FLONASE) 50 MCG/ACT nasal spray Place 2 sprays into both nostrils daily. Patient taking differently: Place 2 sprays into both nostrils daily as needed for allergies.  08/08/19  Yes Hawks, Christy A, FNP  furosemide (LASIX) 40 MG tablet Take 1 tablet    2 x /day    for BP  and Fluid Retention / Ankle Swelling Patient taking differently: Take 40 mg by mouth 2 (two) times daily. Take 1  tablet    2 x /day    for BP  and Fluid Retention / Ankle Swelling 07/15/20  Yes Lucky Cowboy, MD  gabapentin (NEURONTIN) 100 MG capsule Take 1 capsule 3 times a day for Pain Patient taking differently: Take 100 mg by mouth 3 (three) times daily as needed (pain). Take 1 capsule 3 times a day for Pain 08/06/19  Yes Lucky Cowboy, MD  hydrALAZINE (APRESOLINE) 25 MG tablet Take 1 tablet (25 mg total) by mouth 3 (three) times daily. 05/13/20  Yes Judd Gaudier, NP  Magnesium 250 MG TABS Take 250 mg by mouth daily.   Yes [provider]  metFORMIN (GLUCOPHAGE-XR) 500 MG 24 hr tablet Take 2 tablets 2 x /day with Meals for Diabetes Patient taking differently: Take 1,000 mg by mouth 2 (two) times daily with a meal. Take 2 tablets 2 x /day with Meals for Diabetes 11/26/19  Yes Lucky Cowboy, MD  Multiple Vitamins-Minerals (MULTIVITAMIN PO) Take by mouth daily.   Yes [provider]  olmesartan (BENICAR) 40 MG tablet TAKE 1 TABLET DAILY FOR BP & DIABETIC KIDNEY PROTECTION Patient taking differently: Take 40 mg by mouth daily. Take 1 tablet Daily  for BP  & Diabetic Kidney Protection 05/19/20  Yes Judd Gaudier, NP  RESTASIS 0.05 % ophthalmic emulsion Place 1 drop into both eyes 3 (three) times daily as needed for dry eyes. 07/09/20  Yes [provider]  Vitamin D, Ergocalciferol, (DRISDOL) 1.25 MG (50000 UNIT) CAPS capsule Take 1 capsule 2 x /week for Severe Vitamin D Deficiency Patient taking differently: Take 50,000 Units by mouth every 7 (seven) days. Take 1 capsule x /week for Severe Vitamin D Deficiency (Tuesday) 05/23/20  Yes Lucky Cowboy, MD  phentermine (ADIPEX-P) 37.5 MG tablet TAKE 1/2 TO 1 TABLET EVERY MORNING FOR DIETING & WEIGHTLOSS Patient not taking: Reported on 07/22/2020 04/01/20   Judd Gaudier, NP    Allergies    Amlodipine, Naproxen, and Meloxicam & diet manage prod  Review of Systems   Review of Systems  Constitutional: Negative for fever.   Gastrointestinal: Positive for abdominal distention, abdominal pain, constipation and vomiting. Negative for diarrhea.  Genitourinary: Negative for dysuria.  Musculoskeletal: Negative for back pain.  All other systems reviewed and are negative.   Physical Exam Updated Vital Signs BP (!) 143/86 (BP Location: Right Arm)   Pulse 78   Temp 98.8 F (37.1 C) (Oral)   Resp 14   Ht 5\' 5"  (1.651 m)   Wt 87.1 kg   SpO2 100%   BMI  31.95 kg/m   Physical Exam Vitals and nursing note reviewed.  Constitutional:      General: She is not in acute distress.    Appearance: She is well-developed. She is not ill-appearing or diaphoretic.  HENT:     Head: Normocephalic and atraumatic.     Right Ear: External ear normal.     Left Ear: External ear normal.     Nose: Nose normal.  Eyes:     General:        Right eye: No discharge.        Left eye: No discharge.  Cardiovascular:     Rate and Rhythm: Regular rhythm. Tachycardia present.     Heart sounds: Normal heart sounds.  Pulmonary:     Effort: Pulmonary effort is normal.     Breath sounds: Normal breath sounds.  Abdominal:     Palpations: Abdomen is soft.     Tenderness: There is generalized abdominal tenderness (mild).  Skin:    General: Skin is warm and dry.  Neurological:     Mental Status: She is alert.  Psychiatric:        Mood and Affect: Mood is not anxious.     ED Results / Procedures / Treatments   Labs (all labs ordered are listed, but only abnormal results are displayed) Labs Reviewed  COMPREHENSIVE METABOLIC PANEL - Abnormal; Notable for the following components:      Result Value   Sodium 133 (*)    Chloride 90 (*)    Glucose, Bld 178 (*)    Anion gap 16 (*)    All other components within normal limits  CBC - Abnormal; Notable for the following components:   RBC 5.43 (*)    MCV 77.9 (*)    MCH 25.4 (*)    Platelets 436 (*)    All other components within normal limits  URINALYSIS, ROUTINE W REFLEX  MICROSCOPIC - Abnormal; Notable for the following components:   Color, Urine AMBER (*)    APPearance HAZY (*)    Glucose, UA 50 (*)    Ketones, ur 20 (*)    Protein, ur 100 (*)    Bacteria, UA RARE (*)    All other components within normal limits  SARS CORONAVIRUS 2 BY RT PCR (HOSPITAL ORDER, PERFORMED IN [facility])  LIPASE, BLOOD  PREALBUMIN    EKG None  Radiology CT ABDOMEN PELVIS W CONTRAST  Result Date: 07/23/2020 CLINICAL DATA:  Diffuse abdominal pain for 3 days. History of appendectomy and hysterectomy EXAM: CT ABDOMEN AND PELVIS WITH CONTRAST TECHNIQUE: Multidetector CT imaging of the abdomen and pelvis was performed using the standard protocol following bolus administration of intravenous contrast. CONTRAST:  100mL OMNIPAQUE IOHEXOL 300 MG/ML  SOLN COMPARISON:  Abdominal x-ray 07/22/2020 FINDINGS: Lower chest: Layering small right-sided pleural effusion. Left lung bases clear. Heart size is within normal limits. Hepatobiliary: No focal liver abnormality is seen. No gallstones, gallbladder wall thickening, or biliary dilatation. Pancreas: Unremarkable. No pancreatic ductal dilatation or surrounding inflammatory changes. Spleen: Normal in size without focal abnormality. Adrenals/Urinary Tract: Unremarkable adrenal glands. 4 mm nonobstructing stone within the lower pole of the left kidney. 1.8 cm right renal sinus cyst. No hydronephrosis. Urinary bladder is largely decompressed but appears grossly unremarkable. Stomach/Bowel: Numerous dilated loops of small bowel throughout the abdomen measuring up to 3.5 cm in diameter. Small bowel is largely fluid-filled. The stomach is also dilated and fluid-filled. There is transition point to decompressed small bowel within the  right lower quadrant near the base of the cecum (series 7, images 41-44). There is a small volume of stool within the colon. The colon is otherwise decompressed. Vascular/Lymphatic: No significant vascular findings  are present. No enlarged abdominal or pelvic lymph nodes. Reproductive: Status post hysterectomy. No adnexal masses. Other: Trace volume of free fluid within the right lower quadrant. No abdominopelvic fluid collection. No pneumoperitoneum. Tiny fat containing umbilical hernia. Musculoskeletal: No acute or significant osseous findings. IMPRESSION: 1. Small-bowel obstruction with transition point within the right lower quadrant near the base of the cecum, presumably secondary to adhesions. 2. Trace free fluid within the right lower quadrant, likely reactive. No organized fluid collection or pneumoperitoneum. 3. Small right-sided pleural effusion. 4. Nonobstructing 4 mm left renal stone. These results were called by telephone at the time of interpretation on 07/23/2020 at 2:20 pm to provider Pricilla Loveless , who verbally acknowledged these results. Electronically Signed   By: Duanne Guess D.O.   On: 07/23/2020 14:20   DG Abd 2 Views  Result Date: 07/22/2020 CLINICAL DATA:  Acute generalized abdominal pain. EXAM: X-RAY ABDOMEN 2 VIEWS COMPARISON:  None. FINDINGS: Dilated small bowel loops are noted concerning for distal small bowel obstruction. No colonic dilatation is noted. There is no evidence of free air. No radio-opaque calculi or other significant radiographic abnormality is seen. IMPRESSION: Dilated small bowel loops are noted concerning for distal small bowel obstruction. Electronically Signed   By: Lupita Raider M.D.   On: 07/22/2020 16:59    Procedures Procedures (including critical care time)  Medications Ordered in ED Medications  diatrizoate meglumine-sodium (GASTROGRAFIN) 66-10 % solution 90 mL (has no administration in time range)  LORazepam (ATIVAN) injection 0.5 mg (has no administration in time range)  sodium chloride 0.9 % bolus 1,000 mL (1,000 mLs Intravenous New Bag/Given 07/23/20 1258)  iohexol (OMNIPAQUE) 300 MG/ML solution 100 mL (100 mLs Intravenous Contrast Given 07/23/20  1400)    ED Course  I have reviewed the triage vital signs and the nursing notes.  Pertinent labs & imaging results that were available during my care of the patient were reviewed by me and considered in my medical decision making (see chart for details).    MDM Rules/Calculators/A&P                          Patient's CT scan was personally reviewed and does confirm small bowel obstruction.  Labs are overall benign.  Otherwise patient is well-appearing and declines pain medicine.  She will be given IV fluids and surgery was consulted and internal medicine teaching service will admit. Final Clinical Impression(s) / ED Diagnoses Final diagnoses:  Small bowel obstruction [facility])    Rx / DC Orders ED Discharge Orders    None       Pricilla Loveless, MD 07/23/20 1559

## 2020-07-23 NOTE — H&P (Addendum)
Date: 07/23/2020               Patient Name:  Tanya [name] MRN: [id number]  DOB: 09/03/1961 Age / Sex: 59 y.o., female   PCP: Tanya Cowboy, MD         Medical Service: Internal Medicine Teaching Service         Attending Physician: Dr. Pricilla Loveless, MD    First Contact: Dr. Imogene [name] Pager: [phone]  Second Contact: Dr. Maryla [name] Pager: [phone]       After Hours (After 5p/  First Contact Pager: [phone]  weekends / holidays): Second Contact Pager: [phone]   Chief Complaint: abdominal pain and distension  History of Present Illness:   59 y/o F with hx of sarcoidosis, HTN, HLD, DM, OSA presenting with abdominal pain and distension which woke her from sleep 5 days ago. Prior to this was in her usual state of health. Has never had this before. This is associated with bloating, n/v (emesis x3 with stomach contents), indigestion. Symptoms are worse after eating or drinking. Last BM was 1 week ago and normal. Has had sips of liquid, but little PO intake since 5 days ago. Saw an Urgent Care on day of onset, prescribed Cipro for a UTI and miralax which did not improve Sx. Saw her PCP 2 days ago, had abdominal XR which showed dilated small bowel loops concerning for obstruction and instructed to come to ED. Prior appendectomy, abdominal hysterectomy, and myomectomy. Denies fever, chills, diarrhea, constipation, sick contacts, hematochezia.   Meds:  Current Outpatient Medications  Medication Instructions  . aspirin EC 81 mg, Daily  . bisoprolol-hydrochlorothiazide (ZIAC) 10-6.25 MG tablet TAKE 1 TABLET DAILY FOR BLOOD PRESSURE  . ciprofloxacin (CIPRO) 500 mg, Oral, 2 times daily  . cloNIDine (CATAPRES) 0.2 MG tablet TAKE 1 TABLET BY MOUTH TWICE A DAY FOR BLOOD PRESSURE  . Cyanocobalamin (VITAMIN B-12 SL) 1 tablet, Sublingual, Daily  . diclofenac sodium (VOLTAREN) 2 g, Topical, 4 times daily  . fluticasone (FLONASE) 50 MCG/ACT nasal spray 2 sprays, Each Nare, Daily  . furosemide (LASIX)  40 MG tablet Take 1 tablet    2 x /day    for BP  and Fluid Retention / Ankle Swelling  . gabapentin (NEURONTIN) 100 MG capsule Take 1 capsule 3 times a day for Pain  . hydrALAZINE (APRESOLINE) 25 mg, Oral, 3 times daily  . Magnesium 250 mg, Oral, Daily  . metFORMIN (GLUCOPHAGE-XR) 500 MG 24 hr tablet Take 2 tablets 2 x /day with Meals for Diabetes  . Multiple Vitamins-Minerals (MULTIVITAMIN PO) Oral, Daily  . olmesartan (BENICAR) 40 MG tablet TAKE 1 TABLET DAILY FOR BP & DIABETIC KIDNEY PROTECTION  . phentermine (ADIPEX-P) 37.5 MG tablet TAKE 1/2 TO 1 TABLET EVERY MORNING FOR DIETING & WEIGHTLOSS  . RESTASIS 0.05 % ophthalmic emulsion 1 drop, Both Eyes, 3 times daily PRN  . Vitamin D, Ergocalciferol, (DRISDOL) 1.25 MG (50000 UNIT) CAPS capsule Take 1 capsule 2 x /week for Severe Vitamin D Deficiency     Allergies: Allergies as of 07/23/2020 - Review Complete 07/23/2020  Allergen Reaction Noted  . Amlodipine Swelling 07/29/2016  . Naproxen Hives 11/10/2012  . Meloxicam & diet manage prod Rash 09/24/2013   Past Medical History:  Diagnosis Date  . Allergy   . Diabetes mellitus   . Goiter   . Hyperlipidemia   . Hypertension   . Morbid obesity (HCC) 12/24/2013  . OSA (obstructive sleep apnea)   . Palpitations 08/01/2016  .  Sarcoid   . Sarcoidosis   . Vitamin D deficiency     Family History:  Family History  Problem Relation Age of Onset  . Hypertension Mother   . Diabetes Father   . Heart disease Father   . Diabetes Paternal Grandmother   . Heart disease Paternal Grandmother   . Hypertension Sister   . Heart disease Sister   . Hypertension Brother   . Colon cancer Neg Hx   . Esophageal cancer Neg Hx   . Rectal cancer Neg Hx   . Stomach cancer Neg Hx   . Breast cancer Neg Hx     Social History:  Social History   Tobacco Use  . Smoking status: Never Smoker  . Smokeless tobacco: Never Used  Vaping Use  . Vaping Use: Never used  Substance Use Topics  . Alcohol use:  Yes    Comment: 2 glasses of wine a month  . Drug use: No    Review of Systems: Review of Systems  Constitutional: Negative for chills and fever.  HENT: Negative for congestion and sore throat.   Eyes: Negative for blurred vision and double vision.  Respiratory: Negative for cough and shortness of breath.   Cardiovascular: Negative for chest pain and palpitations.  Gastrointestinal: Positive for abdominal pain, heartburn, nausea and vomiting. Negative for blood in stool, constipation, diarrhea and melena.  Genitourinary: Negative for dysuria and urgency.  Musculoskeletal: Negative for back pain and myalgias.  Skin: Negative for itching and rash.  Neurological: Positive for weakness. Negative for dizziness, loss of consciousness and headaches.     Physical Exam: Physical Exam Vitals and nursing note reviewed.  Constitutional:      General: She is not in acute distress.    Appearance: She is well-developed.  HENT:     Head: Normocephalic and atraumatic.     Mouth/Throat:     Mouth: Mucous membranes are moist.  Eyes:     Extraocular Movements: Extraocular movements intact.  Cardiovascular:     Rate and Rhythm: Normal rate and regular rhythm.     Heart sounds: Normal heart sounds.  Pulmonary:     Effort: Pulmonary effort is normal. No respiratory distress.     Breath sounds: Normal breath sounds. No stridor. No wheezing, rhonchi or rales.  Abdominal:     General: Bowel sounds are decreased. There is distension.     Tenderness: There is generalized abdominal tenderness. There is no guarding or rebound.  Skin:    General: Skin is warm and dry.  Neurological:     General: No focal deficit present.     Mental Status: She is alert and oriented to person, place, and time.  Psychiatric:        Mood and Affect: Mood normal.        Behavior: Behavior normal.     EKG: pending  No CXR indicated Abd xray: persistent dilated loops of bowel, tip of NG tube overlying  stomach  Assessment & Plan by Problem: Active Problems:   Hypertension   Hyperlipidemia associated with type 2 diabetes mellitus (HCC)   Type 2 diabetes mellitus with obesity (HCC)   Obesity (BMI 30.0-34.9)   Small bowel obstruction (HCC)   Ltanya R Devargas is a 59 y.o. year old female with hx of sarcoidosis, HTN, HLD, DM, OSA presenting for abdominal pain and distention, imaging and exam consistent with small bowel obstruction. Currently pursuing conservative management.   Dispo: Admit patient to Inpatient with expected length of stay greater  than 2 midnights.  Small bowel obstruction Presents with 5 days of abd pain, distension, n/v, worsening of Sx with PO intake. Last BM 9/15. Surgical hx of appendectomy and abdominal hysterectomy. CT with evidence of SBO with transition point in RLQ near cecum. Currently pursuing conservative management. -Surgery following, appreciate recs     -NPO     -NG tube to suction      -gastrografin per tube -Zofran prn -IVF as needed, currently euvolemic  Asymptomatic bacteruria  Patient states she was diagnosed with a UTI at an Urgent Care on 9/18 and was prescribed 10 days of Ciprofloxacin. Their records are unavailable. She denies any dysuria, frequency, or other urinary Sx. UA here with rare bacteria. -hold off on Abx since she is asymptomatic  Essential HTN BP 121/80 on admission, climbing up to 193/81. Home meds of clonidine 0.2mg  BID, Ziac (bisoprolol-hctz) 10-6.25 1 tab daily, Lasix 40mg  BID, hydralazine 25mg  TID. -start clonidine patch 0.3mg   -labetolol 10mg  1 dose  Question of heart failure Is on Lasix 40mg  daily at home. She denies hx of heart failure and unable to find documentation of this in the chart. Echo in 2017 with EF 60-65% and normal diastolic function. No signs of volume overload on exam. -hold Lasix for now  DM Controlled with metformin only. Last A1c 6.2 on 04/30/20. -start SSI  HLD On olmasartan 40mg  at home.  -resume at  discharge  Hx OSA Reports she was on CPAP at home previously, but her PCP took her off after weight loss.        Signed: , MD 07/23/2020, 4:06 PM  Pager: [phone]  After 5pm on weekdays and 1pm on weekends: On Call pager: [phone]

## 2020-07-23 NOTE — ED Triage Notes (Signed)
Pt reports generalized abd pain with mild nausea and vomiting since Saturday.  Denies diarrhea.  Outpatient x-rays completed yesterday. PCP sent pt to ED for possible SBO.

## 2020-07-23 NOTE — ED Notes (Signed)
2 unsuccessful IV attempts.

## 2020-07-23 NOTE — Consult Note (Signed)
[facility] Consult [facility]  Tanya [name] 07/21/1961  [id number].    Requesting MD: Criss AlvineGoldston  Chief Complaint/Reason for Consult: SBO  HPI:  Patient is a 59 year old female who [facility] Saturday. Patient reports generalized abdominal pain that is worst in lower abdomen. Pain characterized as cramping and is exacerbated by eating/drinking. Pain has progressively worsened since onset. Associated nausea, NBNB emesis, constipation. She originally went to urgent care on Saturday and was found to also have a UTI and was given antibiotics for this. At that time they also told her to take miralax for constipation. She also tried an enema and prune juice at home without any improvement. She talked to her primary care doctor Monday who told her to also try magnesium citrate and got an abdominal film yesterday and then called her today to tell her to come to the hospital. Last BM was Thursday and was unremarkable as far as she remembers. She normally has a BM once a day to every other day and does not take any sort of bowel regimen. PMH otherwise significant for HTN, HLD, T2DM, and obesity. Past abdominal surgeries include open appendectomy when she was in her 5020s and abdominal hysterectomy. She is not on any blood thinners. Allergic to amlodopine and NSAIDs. She reports rare alcohol use and denies tobacco or illicit drug use. 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 She lives with her husband and walks every morning.   ROS: Review of Systems  Constitutional: Negative for chills and fever.  Respiratory: Negative for shortness of breath and wheezing.   Cardiovascular: Negative for chest pain and palpitations.  Gastrointestinal: Positive for abdominal pain, constipation, nausea and vomiting. Negative for blood in stool, diarrhea and melena.  Genitourinary: Negative for dysuria, frequency and urgency.  All other systems reviewed and are negative.   Family History   Problem Relation Age of Onset  . Hypertension Mother   . Diabetes Father   . Heart disease Father   . Diabetes Paternal Grandmother   . Heart disease Paternal Grandmother   . Hypertension Sister   . Heart disease Sister   . Hypertension Brother   . Colon cancer Neg Hx   . Esophageal cancer Neg Hx   . Rectal cancer Neg Hx   . Stomach cancer Neg Hx   . Breast cancer Neg Hx     Past Medical History:  Diagnosis Date  . Allergy   . Diabetes mellitus   . Goiter   . Hyperlipidemia   . Hypertension   . Morbid obesity (HCC) 12/24/2013  . OSA (obstructive sleep apnea)   . Palpitations 08/01/2016  . Sarcoid   . Sarcoidosis   . Vitamin D deficiency     Past Surgical History:  Procedure Laterality Date  . ABDOMINAL HYSTERECTOMY    . achiles tendon     right  . EYE SURGERY     [facility]  . TONSILLECTOMY      Social History:  reports that she has never smoked. She has never used smokeless tobacco. She reports current alcohol use. She reports that she does not use drugs.  Allergies:  Allergies  Allergen Reactions  . Amlodipine Swelling  . Naproxen Hives  . Meloxicam & Diet Manage Prod Rash    (Not in a hospital admission)   Blood pressure (!) 143/86, pulse 78, temperature 98.8 F (37.1 C), temperature source Oral, resp. rate 14, height 5\' 5"  (1.651 m), weight 87.1 kg,  SpO2 100 %. Physical Exam:  General: pleasant, WD, obese female who is laying in bed in NAD HEENT: head is normocephalic, atraumatic.  Sclera are noninjected.  PERRL.  Ears and nose without any masses or lesions.  Mouth is pink and moist Heart: regular, rate, and rhythm.  Normal s1,s2. No obvious murmurs, gallops, or rubs noted.  Palpable radial and pedal pulses bilaterally Lungs: CTAB, no wheezes, rhonchi, or rales noted.  Respiratory effort nonlabored Abd: generalized ttp without peritonitis, distended, BS hypoactive, RLQ surgical scar  MS: all 4 extremities are symmetrical with no cyanosis,  clubbing, or edema. Skin: warm and dry with no masses, lesions, or rashes Neuro: Cranial nerves 2-12 grossly intact, sensation grossly intact throughout  Psych: A&Ox3 with an appropriate affect.   Results for orders placed or performed during the hospital encounter of 07/23/20 (from the past 48 hour(s))  Lipase, blood     Status: None   Collection Time: 07/23/20  8:42 AM  Result Value Ref Range   Lipase 19 11 - 51 U/L    Comment: Performed at [facility], [address], [place], [place] [address]  Comprehensive metabolic panel     Status: Abnormal   Collection Time: 07/23/20  8:42 AM  Result Value Ref Range   Sodium 133 (L) 135 - 145 mmol/L   Potassium 3.5 3.5 - 5.1 mmol/L   Chloride 90 (L) 98 - 111 mmol/L   CO2 27 22 - 32 mmol/L   Glucose, Bld 178 (H) 70 - 99 mg/dL    Comment: Glucose reference range applies only to samples taken after fasting for at least 8 hours.   BUN 18 6 - 20 mg/dL   Creatinine, Ser 2.99 0.44 - 1.00 mg/dL   Calcium 9.0 8.9 - 37.1 mg/dL   Total Protein 6.9 6.5 - 8.1 g/dL   Albumin 3.6 3.5 - 5.0 g/dL   AST 16 15 - 41 U/L   ALT 10 0 - 44 U/L   Alkaline Phosphatase 39 38 - 126 U/L   Total Bilirubin 1.0 0.3 - 1.2 mg/dL   GFR calc non Af Amer >60 >60 mL/min   GFR calc Af Amer >60 >60 mL/min   Anion gap 16 (H) 5 - 15    Comment: Performed at [facility], [address] [place], [place] [address]  CBC     Status: Abnormal   Collection Time: 07/23/20  8:42 AM  Result Value Ref Range   WBC 8.6 4.0 - 10.5 K/uL   RBC 5.43 (H) 3.87 - 5.11 MIL/uL   Hemoglobin 13.8 12.0 - 15.0 g/dL   HCT 93.8 36 - 46 %   MCV 77.9 (L) 80.0 - 100.0 fL   MCH 25.4 (L) 26.0 - 34.0 pg   MCHC 32.6 30.0 - 36.0 g/dL   RDW 10.1 75.1 - 02.5 %   Platelets 436 (H) 150 - 400 K/uL   nRBC 0.0 0.0 - 0.2 %    Comment: Performed at [facility], [address], [place], [place] [address]  Urinalysis, Routine w reflex microscopic Urine, Clean Catch     Status: Abnormal    Collection Time: 07/23/20 12:33 PM  Result Value Ref Range   Color, Urine AMBER (A) YELLOW    Comment: BIOCHEMICALS MAY BE AFFECTED BY COLOR   APPearance HAZY (A) CLEAR   Specific Gravity, Urine 1.026 1.005 - 1.030   pH 5.0 5.0 - 8.0   Glucose, UA 50 (A) NEGATIVE mg/dL  Hgb urine dipstick NEGATIVE NEGATIVE   Bilirubin Urine NEGATIVE NEGATIVE   Ketones, ur 20 (A) NEGATIVE mg/dL   Protein, ur 620 (A) NEGATIVE mg/dL   Nitrite NEGATIVE NEGATIVE   Leukocytes,Ua NEGATIVE NEGATIVE   RBC / HPF 0-5 0 - 5 RBC/hpf   WBC, UA 11-20 0 - 5 WBC/hpf   Bacteria, UA RARE (A) NONE SEEN   Squamous Epithelial / LPF 0-5 0 - 5   Mucus PRESENT    Hyaline Casts, UA PRESENT     Comment: Performed at [facility], [address], [place], [place] [address]   CT ABDOMEN PELVIS W CONTRAST  Result Date: 07/23/2020 CLINICAL DATA:  Diffuse abdominal pain for 3 days. History of appendectomy and hysterectomy EXAM: CT ABDOMEN AND PELVIS WITH CONTRAST TECHNIQUE: Multidetector CT imaging of the abdomen and pelvis was performed using the standard protocol following bolus administration of intravenous contrast. CONTRAST:  OMNIPAQUE IOHEXOL 300 MG/ML  SOLN COMPARISON:  Abdominal x-ray 07/22/2020 FINDINGS: Lower chest: Layering small right-sided pleural effusion. Left lung bases clear. Heart size is within normal limits. Hepatobiliary: No focal liver abnormality is seen. No gallstones, gallbladder wall thickening, or biliary dilatation. Pancreas: Unremarkable. No pancreatic ductal dilatation or surrounding inflammatory changes. Spleen: Normal in size without focal abnormality. Adrenals/Urinary Tract: Unremarkable adrenal glands. 4 mm nonobstructing stone within the lower pole of the left kidney. 1.8 cm right renal sinus cyst. No hydronephrosis. Urinary bladder is largely decompressed but appears grossly unremarkable. Stomach/Bowel: Numerous dilated loops of small bowel throughout the abdomen measuring up to 3.5 cm in  diameter. Small bowel is largely fluid-filled. The stomach is also dilated and fluid-filled. There is transition point to decompressed small bowel within the right lower quadrant near the base of the cecum (series 7, images 41-44). There is a small volume of stool within the colon. The colon is otherwise decompressed. Vascular/Lymphatic: No significant vascular findings are present. No enlarged abdominal or pelvic lymph nodes. Reproductive: Status post hysterectomy. No adnexal masses. Other: Trace volume of free fluid within the right lower quadrant. No abdominopelvic fluid collection. No pneumoperitoneum. Tiny fat containing umbilical hernia. Musculoskeletal: No acute or significant osseous findings. IMPRESSION: 1. Small-bowel obstruction with transition point within the right lower quadrant near the base of the cecum, presumably secondary to adhesions. 2. Trace free fluid within the right lower quadrant, likely reactive. No organized fluid collection or pneumoperitoneum. 3. Small right-sided pleural effusion. 4. Nonobstructing 4 mm left renal stone. These results were called by telephone at the time of interpretation on 07/23/2020 at 2:20 pm to provider Pricilla Loveless , who verbally acknowledged these results. Electronically Signed   By: Duanne Guess D.O.   On: 07/23/2020 14:20   DG Abd 2 Views  Result Date: 07/22/2020 CLINICAL DATA:  Acute generalized abdominal pain. EXAM: X-RAY ABDOMEN 2 VIEWS COMPARISON:  None. FINDINGS: Dilated small bowel loops are noted concerning for distal small bowel obstruction. No colonic dilatation is noted. There is no evidence of free air. No radio-opaque calculi or other significant radiographic abnormality is seen. IMPRESSION: Dilated small bowel loops are noted concerning for distal small bowel obstruction. Electronically Signed   By: Lupita Raider M.D.   On: 07/22/2020 16:59      Assessment/Plan HTN HLD T2DM Hx of Sarcoidosis Obesity - BMI 31.95  Hx of open  appendectomy and abdominal hysterectomy  SBO - recommend medical admission - recommend NGT placement for decompression and then SBO protocol - I have ordered both  - will  also order suppository  - ok to mobilize with NGT clamped after initial decompression - hopefully this will resolve with SBO protocol, but we will follow  FEN: NPO with ice chips for comfort, IVF, NGT to LIWS VTE: ok to have chemical prophylaxis from a surgical standpoint  ID: no current abx  Juliet Rude, [facility] 07/23/2020, 3:00 PM Please see Amion for pager number during day hours 7:00am-4:30pm

## 2020-07-24 ENCOUNTER — Inpatient Hospital Stay (HOSPITAL_COMMUNITY): Payer: BC Managed Care – PPO

## 2020-07-24 LAB — CBC
HCT: 38.9 % (ref 36.0–46.0)
Hemoglobin: 12.9 g/dL (ref 12.0–15.0)
MCH: 26.2 pg (ref 26.0–34.0)
MCHC: 33.2 g/dL (ref 30.0–36.0)
MCV: 78.9 fL — ABNORMAL LOW (ref 80.0–100.0)
Platelets: 395 10*3/uL (ref 150–400)
RBC: 4.93 MIL/uL (ref 3.87–5.11)
RDW: 14.6 % (ref 11.5–15.5)
WBC: 8.2 10*3/uL (ref 4.0–10.5)
nRBC: 0 % (ref 0.0–0.2)

## 2020-07-24 LAB — COMPREHENSIVE METABOLIC PANEL
ALT: 9 U/L (ref 0–44)
AST: 18 U/L (ref 15–41)
Albumin: 3.4 g/dL — ABNORMAL LOW (ref 3.5–5.0)
Alkaline Phosphatase: 35 U/L — ABNORMAL LOW (ref 38–126)
Anion gap: 13 (ref 5–15)
BUN: 18 mg/dL (ref 6–20)
CO2: 33 mmol/L — ABNORMAL HIGH (ref 22–32)
Calcium: 8.7 mg/dL — ABNORMAL LOW (ref 8.9–10.3)
Chloride: 92 mmol/L — ABNORMAL LOW (ref 98–111)
Creatinine, Ser: 0.65 mg/dL (ref 0.44–1.00)
GFR calc Af Amer: >60 mL/min (ref >60)
GFR calc non Af Amer: >60 mL/min (ref >60)
Glucose, Bld: 138 mg/dL — ABNORMAL HIGH (ref 70–99)
Potassium: 3.3 mmol/L — ABNORMAL LOW (ref 3.5–5.1)
Sodium: 138 mmol/L (ref 135–145)
Total Bilirubin: 1.2 mg/dL (ref 0.3–1.2)
Total Protein: 6.3 g/dL — ABNORMAL LOW (ref 6.5–8.1)

## 2020-07-24 LAB — GLUCOSE, CAPILLARY
Glucose-Capillary: 132 mg/dL — ABNORMAL HIGH (ref 70–99)
Glucose-Capillary: 129 mg/dL — ABNORMAL HIGH (ref 70–99)
Glucose-Capillary: 114 mg/dL — ABNORMAL HIGH (ref 70–99)
Glucose-Capillary: 105 mg/dL — ABNORMAL HIGH (ref 70–99)
Glucose-Capillary: 136 mg/dL — ABNORMAL HIGH (ref 70–99)

## 2020-07-24 MED ORDER — SODIUM CHLORIDE 0.9 % IV SOLN: INTRAVENOUS | Status: DC

## 2020-07-24 MED ORDER — LACTATED RINGERS IV SOLN: INTRAVENOUS | Status: DC

## 2020-07-24 MED ORDER — DIATRIZOATE MEGLUMINE & SODIUM 66-10 % PO SOLN
90.0000 mL | Freq: Once | ORAL | Status: AC
  Administered 2020-07-24: 90 mL
  Filled 2020-07-24: qty 90

## 2020-07-24 MED ORDER — LACTATED RINGERS IV SOLN: INTRAVENOUS | Status: AC

## 2020-07-24 MED ORDER — LABETALOL HCL 5 MG/ML IV SOLN
10.0000 mg | Freq: Once | INTRAVENOUS | Status: AC
  Administered 2020-07-24: 10 mg via INTRAVENOUS
  Filled 2020-07-24: qty 4

## 2020-07-24 MED ORDER — POTASSIUM CHLORIDE 10 MEQ/100ML IV SOLN
10.0000 meq | INTRAVENOUS | Status: AC
  Administered 2020-07-24 (×6): 10 meq via INTRAVENOUS
  Filled 2020-07-24 (×5): qty 100

## 2020-07-24 NOTE — Progress Notes (Signed)
[facility] Progress Note     Subjective: CC:  NAEO. Reports filling up an NG cannister in the ED prior to coming to the floor - NG about 1400cc/24h. Denies flatus or BM yet. Currently up in the chair. Denies abd pain but reports some pressure. Denies urinary sxs.   Objective: Vital signs in last 24 hours: Temp:  [98 F (36.7 C)-98.8 F (37.1 C)] 98.4 F (36.9 C) (09/23 0439) Pulse Rate:  [69-111] 92 (09/23 0439) Resp:  [12-32] 17 (09/23 0439) BP: (111-193)/(72-134) 176/92 (09/23 0439) SpO2:  [94 %-100 %] 94 % (09/23 0439) Weight:  [87.1 kg] 87.1 kg (09/22 0834) Last BM Date: 07/19/20  Intake/Output from previous day: 09/22 0701 - 09/23 0700 In: 1100 [P.O.:100; IV Piggyback:1000] Out: 250 [Emesis/NG output:250] Intake/Output this shift: No intake/output data recorded.  PE: Gen:  Alert, NAD, pleasant Card:  Regular rate and rhythm, pedal pulses 2+ BL Pulm:  Normal effort, clear to auscultation bilaterally Abd: Soft, non-tender, mild distention, hypoactive BS, no HSM  NG with brown drainage, 400 in cannister during my exam  Skin: warm and dry, no rashes  Psych: A&Ox3   Lab Results:  Recent Labs    07/23/20 0842 07/24/20 0309  WBC 8.6 8.2  HGB 13.8 12.9  HCT 42.3 38.9  PLT 436* 395   BMET Recent Labs    07/23/20 0842 07/24/20 0309  NA 133* 138  K 3.5 3.3*  CL 90* 92*  CO2 27 33*  GLUCOSE 178* 138*  BUN 18 18  CREATININE 0.70 0.65  CALCIUM 9.0 8.7*   PT/INR No results for input(s): LABPROT, INR in the last 72 hours. CMP     Component Value Date/Time   NA 138 07/24/2020 0309   K 3.3 (L) 07/24/2020 0309   CL 92 (L) 07/24/2020 0309   CO2 33 (H) 07/24/2020 0309   GLUCOSE 138 (H) 07/24/2020 0309   BUN 18 07/24/2020 0309   CREATININE 0.65 07/24/2020 0309   CREATININE 0.66 04/30/2020 0913   CALCIUM 8.7 (L) 07/24/2020 0309   PROT 6.3 (L) 07/24/2020 0309   ALBUMIN 3.4 (L) 07/24/2020 0309   AST 18 07/24/2020 0309   ALT 9 07/24/2020 0309    ALKPHOS 35 (L) 07/24/2020 0309   BILITOT 1.2 07/24/2020 0309   GFRNONAA >60 07/24/2020 0309   GFRNONAA 96 04/30/2020 0913   GFRAA >60 07/24/2020 0309   GFRAA 111 04/30/2020 0913   Lipase     Component Value Date/Time   LIPASE 19 07/23/2020 0842       Studies/Results: CT ABDOMEN PELVIS W CONTRAST  Result Date: 07/23/2020 CLINICAL DATA:  Diffuse abdominal pain for 3 days. History of appendectomy and hysterectomy EXAM: CT ABDOMEN AND PELVIS WITH CONTRAST TECHNIQUE: Multidetector CT imaging of the abdomen and pelvis was performed using the standard protocol following bolus administration of intravenous contrast. CONTRAST:  OMNIPAQUE IOHEXOL 300 MG/ML  SOLN COMPARISON:  Abdominal x-ray 07/22/2020 FINDINGS: Lower chest: Layering small right-sided pleural effusion. Left lung bases clear. Heart size is within normal limits. Hepatobiliary: No focal liver abnormality is seen. No gallstones, gallbladder wall thickening, or biliary dilatation. Pancreas: Unremarkable. No pancreatic ductal dilatation or surrounding inflammatory changes. Spleen: Normal in size without focal abnormality. Adrenals/Urinary Tract: Unremarkable adrenal glands. 4 mm nonobstructing stone within the lower pole of the left kidney. 1.8 cm right renal sinus cyst. No hydronephrosis. Urinary bladder is largely decompressed but appears grossly unremarkable. Stomach/Bowel: Numerous dilated loops of small bowel throughout the abdomen measuring up to  3.5 cm in diameter. Small bowel is largely fluid-filled. The stomach is also dilated and fluid-filled. There is transition point to decompressed small bowel within the right lower quadrant near the base of the cecum (series 7, images 41-44). There is a small volume of stool within the colon. The colon is otherwise decompressed. Vascular/Lymphatic: No significant vascular findings are present. No enlarged abdominal or pelvic lymph nodes. Reproductive: Status post hysterectomy. No adnexal  masses. Other: Trace volume of free fluid within the right lower quadrant. No abdominopelvic fluid collection. No pneumoperitoneum. Tiny fat containing umbilical hernia. Musculoskeletal: No acute or significant osseous findings. IMPRESSION: 1. Small-bowel obstruction with transition point within the right lower quadrant near the base of the cecum, presumably secondary to adhesions. 2. Trace free fluid within the right lower quadrant, likely reactive. No organized fluid collection or pneumoperitoneum. 3. Small right-sided pleural effusion. 4. Nonobstructing 4 mm left renal stone. These results were called by telephone at the time of interpretation on 07/23/2020 at 2:20 pm to provider Pricilla Loveless , who verbally acknowledged these results. Electronically Signed   By: Duanne Guess D.O.   On: 07/23/2020 14:20   DG Abd 2 Views  Result Date: 07/22/2020 CLINICAL DATA:  Acute generalized abdominal pain. EXAM: X-RAY ABDOMEN 2 VIEWS COMPARISON:  None. FINDINGS: Dilated small bowel loops are noted concerning for distal small bowel obstruction. No colonic dilatation is noted. There is no evidence of free air. No radio-opaque calculi or other significant radiographic abnormality is seen. IMPRESSION: Dilated small bowel loops are noted concerning for distal small bowel obstruction. Electronically Signed   By: Lupita Raider M.D.   On: 07/22/2020 16:59   DG Abd Portable 1V-Small Bowel Protocol-Position Verification  Result Date: 07/23/2020 CLINICAL DATA:  NG tube placement EXAM: PORTABLE ABDOMEN - 1 VIEW COMPARISON:  07/22/2020 FINDINGS: The enteric tube tip projects over the gastric body. Again noted are dilated loops of small bowel consistent with the previously described small bowel obstruction. Excreted contrast is noted within the collecting system. IMPRESSION: 1. Enteric tube projects over the gastric body. 2. Persistent small bowel obstruction. Electronically Signed   By: Katherine Mantle M.D.   On:  07/23/2020 17:29    Anti-infectives: Anti-infectives (From admission, onward)   None     Assessment/Plan HTN HLD T2DM Hx of Sarcoidosis Obesity - BMI 31.95  Hx of open appendectomy and abdominal hysterectomy  SBO - recommend medical admission - continue NGT decompression; never got gastrografin in the ED so will get SBO protocol today - ok to mobilize with NGT clamped - hopefully this will resolve with SBO protocol/medical management, we will follow  FEN: NPO with ice chips for comfort, IVF, NGT to LIWS VTE: Lovenox  ID: no current abx   LOS: 1 day    Hosie Spangle, [facility] Please see Amion for pager number during day hours 7:00am-4:30pm

## 2020-07-24 NOTE — Progress Notes (Signed)
Arrived to room 6n27 from ED. Denies nausea/pain at present time.

## 2020-07-24 NOTE — Progress Notes (Addendum)
Subjective:   Tanya [name] states that she feels less bloated after suctioning with her NG tube. She states she is not currently passing gas and hasn't had a bowel movement. She continues to have some mild cramping. She states the surgery team told her that she can clamp and unhook her suction if she'd like to walk to the bathroom or stand up to stretch. She denies any CP, SOB or any other symptoms.   She states she takes Lasix at home twice per day and has taken this for years to keep the fluid off her, denies hx of HF.   Objective:  Vital signs in last 24 hours: Vitals:   07/24/20 0100 07/24/20 0115 07/24/20 0302 07/24/20 0439  BP: (!) 179/91 (!) 156/89 (!) 183/98 (!) 176/92  Pulse: 90 (!) 108 99 92  Resp: (!) 29 20 16 17   Temp:   98.7 F (37.1 C) 98.4 F (36.9 C)  TempSrc:   Oral Oral  SpO2: 95% 95% 96% 94%  Weight:      Height:        Physical Exam Constitutional: no acute distress, resting comfortably Head: atraumatic ENT: external ears normal Eyes: EOMI Cardiovascular: regular rate and rhythm, normal heart sounds, no LE edema Pulmonary: effort normal, lungs clear to ascultation bilaterally Abdominal: flat, diffuse mild tenderness improved from yesterday, no rebound tenderness, bowel sounds decreased but improved compared to yesterday Skin: warm and dry Neurological: alert, no focal deficit Psychiatric: normal mood and affect  Assessment/Plan: Tanya [name] is a 59 y.o. female with hx of HTN, HLD, DM, presenting with abd pain and distension, CT demonstrating SBO. Treating conservatively for now with NGT, NPO, gastrograffin. Symptoms improved since starting NGT.  Active Problems:   Hypertension   Hyperlipidemia associated with type 2 diabetes mellitus (HCC)   Type 2 diabetes mellitus with obesity (HCC)   Obesity (BMI 30.0-34.9)   Small bowel obstruction (HCC)   Small bowel obstruction Presents with 5 days of abd pain, distension, n/v, worsening of Sx with PO  intake. Last BM 9/15. Surgical hx of appendectomy and abdominal hysterectomy. CT with evidence of SBO with transition point in RLQ near cecum. Currently pursuing conservative management. -Surgery following, appreciate recs     -NPO     -NG tube to suction, may clamp for mobility     -gastrografin per tube -Zofran prn -IVF as needed, currently euvolemic  Essential HTN BP 121/80 on admission, climbing up to 193/81. Home meds of clonidine 0.2mg  BID, Ziac (bisoprolol-hctz) 10-6.25 1 tab daily, Lasix 40mg  BID, hydralazine 25mg  TID. -start clonidine patch 0.3mg   -labetolol 10mg  1 dose  Question of heart failure Is on Lasix 40mg  daily at home. She denies hx of heart failure and unable to find documentation of this in the chart, states this is for HTN. Echo in 2017 with EF 60-65% and normal diastolic function. No signs of volume overload on exam. -hold Lasix for now along with her other PO meds  DM Controlled with metformin only. Last A1c 6.2 on 04/30/20. -continue SSI  HLD On olmasartan 40mg  at home.  -resume at discharge  Diet:  NPO except for ice chips IVF:  n/a VTE:  lovenox Prior to Admission Living Arrangement:  home Anticipated Discharge Location:  home Barriers to Discharge:  Small bowel obstruction Dispo: Anticipated discharge in approximately 3-4 day(s).   , MD 07/24/2020, 7:04 AM Pager: [phone] After 5pm on weekdays and 1pm on weekends: On Call pager [phone]  Internal Medicine Attending Attestation:   I have seen and evaluated this patient and I have discussed the plan of care with the house staff. Please see their note for complete details. I concur with their findings with the following additions/corrections:  Please see my separate attestation of the H&P from 07/23/2020.  Anne Shutter, MD 07/24/2020, 6:17 PM

## 2020-07-25 ENCOUNTER — Encounter (HOSPITAL_COMMUNITY): Payer: Self-pay | Admitting: Internal Medicine

## 2020-07-25 ENCOUNTER — Inpatient Hospital Stay (HOSPITAL_COMMUNITY): Payer: BC Managed Care – PPO

## 2020-07-25 DIAGNOSIS — K56609 Unspecified intestinal obstruction, unspecified as to partial versus complete obstruction: Secondary | ICD-10-CM

## 2020-07-25 HISTORY — DX: Unspecified intestinal obstruction, unspecified as to partial versus complete obstruction: K56.609

## 2020-07-25 LAB — BASIC METABOLIC PANEL
Anion gap: 18 — ABNORMAL HIGH (ref 5–15)
BUN: 17 mg/dL (ref 6–20)
CO2: 28 mmol/L (ref 22–32)
Calcium: 9 mg/dL (ref 8.9–10.3)
Chloride: 93 mmol/L — ABNORMAL LOW (ref 98–111)
Creatinine, Ser: 0.64 mg/dL (ref 0.44–1.00)
GFR calc Af Amer: >60 mL/min (ref >60)
GFR calc non Af Amer: >60 mL/min (ref >60)
Glucose, Bld: 97 mg/dL (ref 70–99)
Potassium: 3.7 mmol/L (ref 3.5–5.1)
Sodium: 139 mmol/L (ref 135–145)

## 2020-07-25 LAB — GLUCOSE, CAPILLARY
Glucose-Capillary: 109 mg/dL — ABNORMAL HIGH (ref 70–99)
Glucose-Capillary: 119 mg/dL — ABNORMAL HIGH (ref 70–99)
Glucose-Capillary: 131 mg/dL — ABNORMAL HIGH (ref 70–99)
Glucose-Capillary: 81 mg/dL (ref 70–99)
Glucose-Capillary: 88 mg/dL (ref 70–99)
Glucose-Capillary: 89 mg/dL (ref 70–99)
Glucose-Capillary: 98 mg/dL (ref 70–99)

## 2020-07-25 MED ORDER — PHENOL 1.4 % MT LIQD
1.0000 | OROMUCOSAL | Status: DC | PRN
  Filled 2020-07-25: qty 177

## 2020-07-25 MED ORDER — PANTOPRAZOLE SODIUM 40 MG IV SOLR
40.0000 mg | Freq: Every day | INTRAVENOUS | Status: DC
  Administered 2020-07-25 – 2020-08-01 (×8): 40 mg via INTRAVENOUS
  Filled 2020-07-25 (×8): qty 40

## 2020-07-25 MED ORDER — CLONIDINE HCL 0.3 MG/24HR TD PTWK
0.3000 mg | MEDICATED_PATCH | TRANSDERMAL | Status: DC
  Administered 2020-07-25: 0.3 mg via TRANSDERMAL
  Filled 2020-07-25 (×2): qty 1

## 2020-07-25 MED ORDER — POTASSIUM CHLORIDE 10 MEQ/100ML IV SOLN
10.0000 meq | INTRAVENOUS | Status: AC
  Administered 2020-07-25 (×5): 10 meq via INTRAVENOUS
  Filled 2020-07-25 (×5): qty 100

## 2020-07-25 MED ORDER — LABETALOL HCL 5 MG/ML IV SOLN
20.0000 mg | Freq: Once | INTRAVENOUS | Status: AC
  Administered 2020-07-25: 20 mg via INTRAVENOUS
  Filled 2020-07-25: qty 4

## 2020-07-25 MED ORDER — LACTATED RINGERS IV SOLN: INTRAVENOUS | Status: DC

## 2020-07-25 MED ORDER — LABETALOL HCL 5 MG/ML IV SOLN: 10.0000 mg | INTRAVENOUS | Status: DC | PRN

## 2020-07-25 NOTE — Progress Notes (Signed)
Pt cont to have elevated BP-180/88. Pt asymptomatic. Notified on call Dr. Kirke Corin with no new order. Will cont to monitor.

## 2020-07-25 NOTE — Progress Notes (Addendum)
Pt noted BP --184/74.Pt asymptomatic.Notified on call Dr. Kirke Corin with no new orders.

## 2020-07-25 NOTE — Progress Notes (Signed)
[facility] Progress Note     Subjective: CC:  NAEO. Denies flatus or BM. Abdominal bloating and cramping slightly improved.   Objective: Vital signs in last 24 hours: Temp:  [98.2 F (36.8 C)-98.9 F (37.2 C)] 98.2 F (36.8 C) (09/24 0411) Pulse Rate:  [64-94] 64 (09/24 0411) Resp:  [17-20] 20 (09/24 0411) BP: (132-192)/(74-102) 184/74 (09/24 0411) SpO2:  [97 %-100 %] 97 % (09/24 0411) Last BM Date: 07/19/20  Intake/Output from previous day: 09/23 0701 - 09/24 0700 In: 591 [I.V.:591] Out: 1150 [Emesis/NG output:1150] Intake/Output this shift: No intake/output data recorded.  PE: Gen:  Alert, NAD, pleasant Card:  Regular rate and rhythm, pedal pulses 2+ BL Pulm:  Normal effort, clear to auscultation bilaterally Abd: Soft, non-tender, mild distention, hypoactive BS, no HSM  NG with brown drainage, 900 in cannister during my exam, over 1,100 cc documented in 24 hours Skin: warm and dry, no rashes  Psych: A&Ox3   Lab Results:  Recent Labs    07/23/20 0842 07/24/20 0309  WBC 8.6 8.2  HGB 13.8 12.9  HCT 42.3 38.9  PLT 436* 395   BMET Recent Labs    07/23/20 0842 07/24/20 0309  NA 133* 138  K 3.5 3.3*  CL 90* 92*  CO2 27 33*  GLUCOSE 178* 138*  BUN 18 18  CREATININE 0.70 0.65  CALCIUM 9.0 8.7*   PT/INR No results for input(s): LABPROT, INR in the last 72 hours. CMP     Component Value Date/Time   NA 138 07/24/2020 0309   K 3.3 (L) 07/24/2020 0309   CL 92 (L) 07/24/2020 0309   CO2 33 (H) 07/24/2020 0309   GLUCOSE 138 (H) 07/24/2020 0309   BUN 18 07/24/2020 0309   CREATININE 0.65 07/24/2020 0309   CREATININE 0.66 04/30/2020 0913   CALCIUM 8.7 (L) 07/24/2020 0309   PROT 6.3 (L) 07/24/2020 0309   ALBUMIN 3.4 (L) 07/24/2020 0309   AST 18 07/24/2020 0309   ALT 9 07/24/2020 0309   ALKPHOS 35 (L) 07/24/2020 0309   BILITOT 1.2 07/24/2020 0309   GFRNONAA >60 07/24/2020 0309   GFRNONAA 96 04/30/2020 0913   GFRAA >60 07/24/2020 0309    GFRAA 111 04/30/2020 0913   Lipase     Component Value Date/Time   LIPASE 19 07/23/2020 0842       Studies/Results: CT ABDOMEN PELVIS W CONTRAST  Result Date: 07/23/2020 CLINICAL DATA:  Diffuse abdominal pain for 3 days. History of appendectomy and hysterectomy EXAM: CT ABDOMEN AND PELVIS WITH CONTRAST TECHNIQUE: Multidetector CT imaging of the abdomen and pelvis was performed using the standard protocol following bolus administration of intravenous contrast. CONTRAST:  OMNIPAQUE IOHEXOL 300 MG/ML  SOLN COMPARISON:  Abdominal x-ray 07/22/2020 FINDINGS: Lower chest: Layering small right-sided pleural effusion. Left lung bases clear. Heart size is within normal limits. Hepatobiliary: No focal liver abnormality is seen. No gallstones, gallbladder wall thickening, or biliary dilatation. Pancreas: Unremarkable. No pancreatic ductal dilatation or surrounding inflammatory changes. Spleen: Normal in size without focal abnormality. Adrenals/Urinary Tract: Unremarkable adrenal glands. 4 mm nonobstructing stone within the lower pole of the left kidney. 1.8 cm right renal sinus cyst. No hydronephrosis. Urinary bladder is largely decompressed but appears grossly unremarkable. Stomach/Bowel: Numerous dilated loops of small bowel throughout the abdomen measuring up to 3.5 cm in diameter. Small bowel is largely fluid-filled. The stomach is also dilated and fluid-filled. There is transition point to decompressed small bowel within the right lower quadrant near the base of  the cecum (series 7, images 41-44). There is a small volume of stool within the colon. The colon is otherwise decompressed. Vascular/Lymphatic: No significant vascular findings are present. No enlarged abdominal or pelvic lymph nodes. Reproductive: Status post hysterectomy. No adnexal masses. Other: Trace volume of free fluid within the right lower quadrant. No abdominopelvic fluid collection. No pneumoperitoneum. Tiny fat containing umbilical  hernia. Musculoskeletal: No acute or significant osseous findings. IMPRESSION: 1. Small-bowel obstruction with transition point within the right lower quadrant near the base of the cecum, presumably secondary to adhesions. 2. Trace free fluid within the right lower quadrant, likely reactive. No organized fluid collection or pneumoperitoneum. 3. Small right-sided pleural effusion. 4. Nonobstructing 4 mm left renal stone. These results were called by telephone at the time of interpretation on 07/23/2020 at 2:20 pm to provider Pricilla Loveless , who verbally acknowledged these results. Electronically Signed   By: Duanne Guess D.O.   On: 07/23/2020 14:20   DG Abd Portable 1V-Small Bowel Obstruction Protocol-initial, 8 hr delay  Result Date: 07/24/2020 CLINICAL DATA:  Small-bowel obstruction EXAM: PORTABLE ABDOMEN - 1 VIEW COMPARISON:  07/23/2020 FINDINGS: The enteric tube projects over the stomach. If the patient was administered oral contrast it appears that the majority of the contrast by the resides within the stomach or small bowel. There is no clear oral contrast within the colon. IMPRESSION: The enteric tube projects over the stomach. No oral contrast visualized in the colon. Electronically Signed   By: Katherine Mantle M.D.   On: 07/24/2020 19:08   DG Abd Portable 1V-Small Bowel Protocol-Position Verification  Result Date: 07/23/2020 CLINICAL DATA:  NG tube placement EXAM: PORTABLE ABDOMEN - 1 VIEW COMPARISON:  07/22/2020 FINDINGS: The enteric tube tip projects over the gastric body. Again noted are dilated loops of small bowel consistent with the previously described small bowel obstruction. Excreted contrast is noted within the collecting system. IMPRESSION: 1. Enteric tube projects over the gastric body. 2. Persistent small bowel obstruction. Electronically Signed   By: Katherine Mantle M.D.   On: 07/23/2020 17:29    Anti-infectives: Anti-infectives (From admission, onward)   None      Assessment/Plan HTN HLD T2DM Hx of Sarcoidosis Obesity - BMI 31.95  Hx of open appendectomy and abdominal hysterectomy  SBO - continue NGT decompression - SB protocol 9/23 >> contrast has not progress to colon, KUB this AM pending  - clinically patient remains obstructed with high NG tube output, Will discuss with MD but may need exploratory laparotomy.   FEN: NPO with ice chips for comfort, IVF, NGT to LIWS VTE: Lovenox  ID: no current abx   LOS: 2 days    Hosie Spangle, [facility] Surgery Please see Amion for pager number during day hours 7:00am-4:30pm

## 2020-07-25 NOTE — Progress Notes (Signed)
Pt noted with elevated BP--192/102. Notified on call Dr. Kirke Corin and received new order to give Labetalol 20mg  IV.

## 2020-07-25 NOTE — Progress Notes (Signed)
Pt noted with elevated BP- 176/85. Notified on call Dr. Kirke Corin with new order to give Labetalol 10mg  IV.

## 2020-07-25 NOTE — Progress Notes (Signed)
   Subjective:   Patient interviewed at bedside. States that she is fairly comfortable, abdominal pain improved from yesterday. Blood pressure elevated overnight, unable to give her home meds due to NPO. Denies HA, CP, SOB, dizziness.  Objective:  Vital signs in last 24 hours: Vitals:   07/24/20 2306 07/25/20 0142 07/25/20 0411 07/25/20 1406  BP: (!) 180/88 (!) 192/102 (!) 184/74 (!) 186/84  Pulse: 75  64 70  Resp:   20 19  Temp:   98.2 F (36.8 C) 98.7 F (37.1 C)  TempSrc:   Oral Oral  SpO2:   97% 97%  Weight:      Height:        Physical Exam Constitutional: no acute distress Head: atraumatic ENT: external ears normal Eyes: EOMI Cardiovascular: regular rate and rhythm, normal heart sounds, no LE edema Pulmonary: effort normal, lungs clear to ascultation bilaterally Abdominal: flat, nontender, no rebound tenderness, bowel sounds decreased, NG tube draining dark fluid consistent with gastrograffin Skin: warm and dry Neurological: alert, no focal deficit Psychiatric: normal mood and affect  Assessment/Plan: Tanya [name] is a 59 y.o. female with hx of sarcoidosis, HTN, HLD, DM, OSA presenting for abdominal pain and distention, imaging and exam consistent with small bowel obstruction. Currently pursuing conservative management.   Principal Problem:   Small bowel obstruction (HCC) Active Problems:   Hypertension   Hyperlipidemia associated with type 2 diabetes mellitus (HCC)   Type 2 diabetes mellitus with obesity (HCC)   Obesity (BMI 30.0-34.9)  Small bowel obstruction Presents with 5 days of abd pain, distension, n/v, worsening of Sx with PO intake. Last BM 9/15. Surgical hx of appendectomy and abdominal hysterectomy. CT with evidence of SBO with transition point in RLQ near cecum. Currently pursuing conservative management. XR with no gastrograffin in colon. -Surgery following, appreciate recs     -NPO except ice chips     -NG tube to suction      -f/u r/p abd  XR -Zofran prn -IVF as needed, currently euvolemic  Essential HTN BP 121/80 on admission, climbing up to 193/81. Home meds of clonidine 0.2mg  BID, Ziac (bisoprolol-hctz) 10-6.25 1 tab daily, Lasix 40mg  BID, hydralazine 25mg  TID. -continue clonidine patch 0.3mg    Question of heart failure Is on Lasix 40mg  daily at home. She denies hx of heart failure and unable to find documentation of this in the chart. Echo in 2017 with EF 60-65% and normal diastolic function. No signs of volume overload on exam. -hold Lasix for now  DM Controlled with metformin only. Last A1c 6.2 on 04/30/20. -continue SSI  HLD On olmasartan 40mg  at home.  -resume at discharge      Diet:  NPO except ice chips IVF:  LR VTE:  lovenox Prior to Admission Living Arrangement:  home Anticipated Discharge Location:  home Barriers to Discharge:  SBO Dispo: Anticipated discharge in approximately 2-3 day(s).   , MD 07/25/2020, 3:02 PM Pager: [phone] After 5pm on weekdays and 1pm on weekends: On Call pager [phone]

## 2020-07-26 ENCOUNTER — Inpatient Hospital Stay (HOSPITAL_COMMUNITY): Payer: BC Managed Care – PPO

## 2020-07-26 LAB — CBC
HCT: 35.2 % — ABNORMAL LOW (ref 36.0–46.0)
Hemoglobin: 11.5 g/dL — ABNORMAL LOW (ref 12.0–15.0)
MCH: 26.3 pg (ref 26.0–34.0)
MCHC: 32.7 g/dL (ref 30.0–36.0)
MCV: 80.5 fL (ref 80.0–100.0)
Platelets: 347 10*3/uL (ref 150–400)
RBC: 4.37 MIL/uL (ref 3.87–5.11)
RDW: 15 % (ref 11.5–15.5)
WBC: 6.5 10*3/uL (ref 4.0–10.5)
nRBC: 0 % (ref 0.0–0.2)

## 2020-07-26 LAB — BASIC METABOLIC PANEL
Anion gap: 18 — ABNORMAL HIGH (ref 5–15)
BUN: 16 mg/dL (ref 6–20)
CO2: 27 mmol/L (ref 22–32)
Calcium: 8.5 mg/dL — ABNORMAL LOW (ref 8.9–10.3)
Chloride: 96 mmol/L — ABNORMAL LOW (ref 98–111)
Creatinine, Ser: 0.6 mg/dL (ref 0.44–1.00)
GFR calc Af Amer: >60 mL/min (ref >60)
GFR calc non Af Amer: >60 mL/min (ref >60)
Glucose, Bld: 77 mg/dL (ref 70–99)
Potassium: 3.8 mmol/L (ref 3.5–5.1)
Sodium: 141 mmol/L (ref 135–145)

## 2020-07-26 LAB — GLUCOSE, CAPILLARY
Glucose-Capillary: 104 mg/dL — ABNORMAL HIGH (ref 70–99)
Glucose-Capillary: 128 mg/dL — ABNORMAL HIGH (ref 70–99)
Glucose-Capillary: 63 mg/dL — ABNORMAL LOW (ref 70–99)
Glucose-Capillary: 65 mg/dL — ABNORMAL LOW (ref 70–99)
Glucose-Capillary: 78 mg/dL (ref 70–99)
Glucose-Capillary: 79 mg/dL (ref 70–99)
Glucose-Capillary: 84 mg/dL (ref 70–99)

## 2020-07-26 MED ORDER — DEXTROSE-NACL 5-0.9 % IV SOLN: INTRAVENOUS | Status: DC

## 2020-07-26 MED ORDER — LACTATED RINGERS IV SOLN: INTRAVENOUS | Status: DC

## 2020-07-26 MED ORDER — DEXTROSE 50 % IV SOLN
INTRAVENOUS | Status: AC
  Administered 2020-07-26: 50 mL
  Filled 2020-07-26: qty 50

## 2020-07-26 NOTE — Progress Notes (Signed)
[facility] Progress Note     Subjective: CC:  Denies abdominal pain. Reports discomfort from NG. Reports a very small amt flatus, states it is much less than usual. Denies BM. Ambulating in room.  NG- 800 cc/24h   Objective: Vital signs in last 24 hours: Temp:  [98.2 F (36.8 C)-98.7 F (37.1 C)] 98.2 F (36.8 C) (09/25 0404) Pulse Rate:  [66-70] 68 (09/25 0404) Resp:  [18-19] 18 (09/25 0404) BP: (172-191)/(76-84) 172/83 (09/25 0404) SpO2:  [97 %-100 %] 98 % (09/25 0404) Last BM Date: 07/19/20  Intake/Output from previous day: 09/24 0701 - 09/25 0700 In: 390 [I.V.:360; NG/GT:30] Out: 800 [Emesis/NG output:800] Intake/Output this shift: No intake/output data recorded.  PE: Gen:  Alert, NAD, pleasant Card:  Regular rate and rhythm, pedal pulses 2+ BL Pulm:  Normal effort, clear to auscultation bilaterally Abd: Soft, non-tender, mild distention, hypoactive BS, no HSM  NG with brown drainage, 800 cc/24h documented, 500 cc in cannister during my exam. Skin: warm and dry, no rashes  Psych: A&Ox3   Lab Results:  Recent Labs    07/24/20 0309 07/26/20 0244  WBC 8.2 6.5  HGB 12.9 11.5*  HCT 38.9 35.2*  PLT 395 347   BMET Recent Labs    07/25/20 1808 07/26/20 0244  NA 139 141  K 3.7 3.8  CL 93* 96*  CO2 28 27  GLUCOSE 97 77  BUN 17 16  CREATININE 0.64 0.60  CALCIUM 9.0 8.5*   PT/INR No results for input(s): LABPROT, INR in the last 72 hours. CMP     Component Value Date/Time   NA 141 07/26/2020 0244   K 3.8 07/26/2020 0244   CL 96 (L) 07/26/2020 0244   CO2 27 07/26/2020 0244   GLUCOSE 77 07/26/2020 0244   BUN 16 07/26/2020 0244   CREATININE 0.60 07/26/2020 0244   CREATININE 0.66 04/30/2020 0913   CALCIUM 8.5 (L) 07/26/2020 0244   PROT 6.3 (L) 07/24/2020 0309   ALBUMIN 3.4 (L) 07/24/2020 0309   AST 18 07/24/2020 0309   ALT 9 07/24/2020 0309   ALKPHOS 35 (L) 07/24/2020 0309   BILITOT 1.2 07/24/2020 0309   GFRNONAA >60 07/26/2020 0244    GFRNONAA 96 04/30/2020 0913   GFRAA >60 07/26/2020 0244   GFRAA 111 04/30/2020 0913   Lipase     Component Value Date/Time   LIPASE 19 07/23/2020 0842       Studies/Results: DG Abd Portable 1V  Result Date: 07/25/2020 CLINICAL DATA:  Small bowel obstruction. EXAM: PORTABLE ABDOMEN - 1 VIEW COMPARISON:  July 24, 2020. FINDINGS: Mildly dilated small bowel loops are noted in the left lower quadrant which may represent ileus, although small-bowel obstruction cannot be excluded. No colonic dilatation is noted. Nasogastric tube tip is seen in proximal stomach. Phleboliths noted in the pelvis. IMPRESSION: Mildly dilated small bowel loops are noted in left lower quadrant which may represent ileus, although small-bowel obstruction cannot be excluded. Nasogastric tube tip is seen in proximal stomach. Electronically Signed   By: Lupita Raider M.D.   On: 07/25/2020 08:51   DG Abd Portable 1V-Small Bowel Obstruction Protocol-initial, 8 hr delay  Result Date: 07/24/2020 CLINICAL DATA:  Small-bowel obstruction EXAM: PORTABLE ABDOMEN - 1 VIEW COMPARISON:  07/23/2020 FINDINGS: The enteric tube projects over the stomach. If the patient was administered oral contrast it appears that the majority of the contrast by the resides within the stomach or small bowel. There is no clear oral contrast within the colon.  IMPRESSION: The enteric tube projects over the stomach. No oral contrast visualized in the colon. Electronically Signed   By: Katherine Mantle M.D.   On: 07/24/2020 19:08    Anti-infectives: Anti-infectives (From admission, onward)   None     Assessment/Plan HTN HLD T2DM Hx of Sarcoidosis Obesity - BMI 31.95  Hx of open appendectomy and abdominal hysterectomy  SBO - continue NGT decompression - SB protocol 9/23 >> contrast has not progress to colon, KUB this AM with no visible contrast. Some dilated loops of small bowel LLQ. - clinically patient remains obstructed with high NG  tube output, Will discuss with MD but may need exploratory laparotomy.   FEN: NPO with ice chips for comfort, IVF, NGT to LIWS VTE: Lovenox  ID: no current abx   LOS: 3 days    Hosie Spangle, [facility] Surgery Please see Amion for pager number during day hours 7:00am-4:30pm

## 2020-07-26 NOTE — Progress Notes (Addendum)
Subjective:   Patient is doing well, although the NG tube bothers her. She passes some gas yesterday but had no bowel movement. Denies abdominal pain, nausea or vomiting. She reports being hungry today.  Objective:  Vital signs in last 24 hours: Vitals:   07/25/20 0411 07/25/20 1406 07/25/20 1940 07/26/20 0404  BP: (!) 184/74 (!) 186/84 (!) 191/76 (!) 172/83  Pulse: 64 70 66 68  Resp: 20 19 18 18   Temp: 98.2 F (36.8 C) 98.7 F (37.1 C) 98.3 F (36.8 C) 98.2 F (36.8 C)  TempSrc: Oral Oral Oral Oral  SpO2: 97% 97% 100% 98%  Weight:      Height:       Constitutional: Well-developed and well-nourished. No acute distress.  Head: Normocephalic and atraumatic.  Eyes: Conjunctivae are normal, EOM nl Cardiovascular:  RRR, nl S1S2, no murmur,  no LEE Respiratory: Effort normal and breath sounds normal. No respiratory distress. No wheezes.  GI: NG tube in place. Soft. Bowel sounds are normal. No distension. There is no tenderness.  Neurological: Is alert and oriented x 3  Skin: Not diaphoretic. No erythema.  Psychiatric:  Normal mood and affect. Behavior is normal. Judgment and thought content normal.   BMP Latest Ref Rng & Units 07/26/2020 07/25/2020 07/24/2020  Glucose 70 - 99 mg/dL 77 97 07/26/2020)  BUN 6 - 20 mg/dL 16 17 18   Creatinine 0.44 - 1.00 mg/dL 154(M 0.86  BUN/Creat Ratio 6 - 22 (calc) - - -  Sodium 135 - 145 mmol/L 141 139 138  Potassium 3.5 - 5.1 mmol/L 3.8 3.7 3.3(L)  Chloride 98 - 111 mmol/L 96(L) 93(L) 92(L)  CO2 22 - 32 mmol/L 27 28 33(H)  Calcium 8.9 - 10.3 mg/dL 7.61) 9.0 9.50)    CBC Latest Ref Rng & Units 07/26/2020 07/24/2020 07/23/2020  WBC 4.0 - 10.5 K/uL 6.5 8.2 8.6  Hemoglobin 12.0 - 15.0 g/dL 11.5(L) 12.9 13.8  Hematocrit 36 - 46 % 35.2(L) 38.9 42.3  Platelets 150 - 400 K/uL 347 395 436(H)    Assessment/Plan: Tanya [name] is a 59 y.o. female with hx of sarcoidosis, HTN, HLD, DM, OSA presenting for abdominal pain and distention, imaging and  exam consistent with small bowel obstruction. Currently pursuing conservative management.   Principal Problem:   Small bowel obstruction (HCC) Active Problems:   Hypertension   Hyperlipidemia associated with type 2 diabetes mellitus (HCC)   Type 2 diabetes mellitus with obesity (HCC)   Obesity (BMI 30.0-34.9)  Small bowel obstruction Presents with 5 days of abd pain, distension, n/v, worsening of Sx with PO intake. Last BM 9/15. Surgical hx of appendectomy and abdominal hysterectomy. CT with evidence of SBO with transition point in RLQ near cecum. Currently pursuing conservative management. XR with no gastrograffin in colon.  Slight improvement per surgery. Will continue current plan of medical management.  -increasing IV fluid given pt had significant amount of GI suctioning -Per surgery: DC NG tube today. Remain NPO- ice chips only. -Per surgery, if she becomes more distended or nauseated today, will plan exp lap tomorrow. -LR 125 ml/h -NG tube to suction  -f/u r/p abd XR -Zofran prn -Continue IV PPI -IVF as needed, currently euvolemic -Appreciate gen surg follow up and rec  Essential HTN Hypertensive but asymptomatic.   -continue clonidine patch 0.3mg    Question of heart failure Is on Lasix 40mg  daily at home. She denies hx of heart failure and unable to find documentation of this in the chart. Echo in  2017 with EF 60-65% and normal diastolic function. No signs of volume overload on exam. -hold Lasix for now  DM Controlled with metformin only at home. Last A1c 6.2 on 04/30/20. BG ~80-90 today. No hypoglycemic event  -continue SSI  HLD On olmasartan 40mg  at home.  -resume at discharge      Diet:  NPO except ice chips IVF:  LR 125 ml/h VTE:  lovenox Prior to Admission Living Arrangement:  home Anticipated Discharge Location:  home Barriers to Discharge:  SBO Dispo: Anticipated discharge depends on clinical progress  , MD 07/26/2020, 6:22  AM Pager: [phone] After 5pm on weekdays and 1pm on weekends: On Call pager [phone]

## 2020-07-26 NOTE — Plan of Care (Signed)
  Problem: Education: Goal: Knowledge of General Education information will improve Description Including pain rating scale, medication(s)/side effects and non-pharmacologic comfort measures Outcome: Progressing   

## 2020-07-27 ENCOUNTER — Inpatient Hospital Stay (HOSPITAL_COMMUNITY): Payer: BC Managed Care – PPO

## 2020-07-27 ENCOUNTER — Inpatient Hospital Stay (HOSPITAL_COMMUNITY): Payer: BC Managed Care – PPO | Admitting: Certified Registered Nurse Anesthetist

## 2020-07-27 ENCOUNTER — Encounter (HOSPITAL_COMMUNITY)
Admission: EM | Disposition: A | Discharge status: Home / Self Care | Payer: Self-pay | Source: Home / Self Care | Attending: Internal Medicine

## 2020-07-27 DIAGNOSIS — K56609 Unspecified intestinal obstruction, unspecified as to partial versus complete obstruction: Secondary | ICD-10-CM

## 2020-07-27 DIAGNOSIS — Z90711 Acquired absence of uterus with remaining cervical stump: Secondary | ICD-10-CM

## 2020-07-27 DIAGNOSIS — E1169 Type 2 diabetes mellitus with other specified complication: Secondary | ICD-10-CM

## 2020-07-27 DIAGNOSIS — E785 Hyperlipidemia, unspecified: Secondary | ICD-10-CM

## 2020-07-27 DIAGNOSIS — Z6830 Body mass index (BMI) 30.0-30.9, adult: Secondary | ICD-10-CM

## 2020-07-27 DIAGNOSIS — G4733 Obstructive sleep apnea (adult) (pediatric): Secondary | ICD-10-CM

## 2020-07-27 DIAGNOSIS — Z9049 Acquired absence of other specified parts of digestive tract: Secondary | ICD-10-CM

## 2020-07-27 DIAGNOSIS — Z7984 Long term (current) use of oral hypoglycemic drugs: Secondary | ICD-10-CM

## 2020-07-27 DIAGNOSIS — I10 Essential (primary) hypertension: Secondary | ICD-10-CM

## 2020-07-27 DIAGNOSIS — D869 Sarcoidosis, unspecified: Secondary | ICD-10-CM

## 2020-07-27 HISTORY — PX: LAPAROTOMY: SHX154

## 2020-07-27 LAB — BASIC METABOLIC PANEL
Anion gap: 9 (ref 5–15)
BUN: 7 mg/dL (ref 6–20)
CO2: 27 mmol/L (ref 22–32)
Calcium: 8.3 mg/dL — ABNORMAL LOW (ref 8.9–10.3)
Chloride: 100 mmol/L (ref 98–111)
Creatinine, Ser: 0.56 mg/dL (ref 0.44–1.00)
GFR calc Af Amer: >60 mL/min (ref >60)
GFR calc non Af Amer: >60 mL/min (ref >60)
Glucose, Bld: 138 mg/dL — ABNORMAL HIGH (ref 70–99)
Potassium: 3.4 mmol/L — ABNORMAL LOW (ref 3.5–5.1)
Sodium: 136 mmol/L (ref 135–145)

## 2020-07-27 LAB — SURGICAL PCR SCREEN
MRSA, PCR: NEGATIVE
Staphylococcus aureus: POSITIVE — AB

## 2020-07-27 LAB — GLUCOSE, CAPILLARY
Glucose-Capillary: 123 mg/dL — ABNORMAL HIGH (ref 70–99)
Glucose-Capillary: 130 mg/dL — ABNORMAL HIGH (ref 70–99)
Glucose-Capillary: 173 mg/dL — ABNORMAL HIGH (ref 70–99)
Glucose-Capillary: 175 mg/dL — ABNORMAL HIGH (ref 70–99)
Glucose-Capillary: 189 mg/dL — ABNORMAL HIGH (ref 70–99)

## 2020-07-27 LAB — CBC
HCT: 34.2 % — ABNORMAL LOW (ref 36.0–46.0)
Hemoglobin: 11 g/dL — ABNORMAL LOW (ref 12.0–15.0)
MCH: 26 pg (ref 26.0–34.0)
MCHC: 32.2 g/dL (ref 30.0–36.0)
MCV: 80.9 fL (ref 80.0–100.0)
Platelets: 352 10*3/uL (ref 150–400)
RBC: 4.23 MIL/uL (ref 3.87–5.11)
RDW: 14.7 % (ref 11.5–15.5)
WBC: 5.6 10*3/uL (ref 4.0–10.5)
nRBC: 0 % (ref 0.0–0.2)

## 2020-07-27 SURGERY — LAPAROTOMY, EXPLORATORY
Anesthesia: General | Site: Abdomen

## 2020-07-27 MED ORDER — DEXAMETHASONE SODIUM PHOSPHATE 10 MG/ML IJ SOLN
INTRAMUSCULAR | Status: AC
  Filled 2020-07-27: qty 1

## 2020-07-27 MED ORDER — HYDROMORPHONE HCL 1 MG/ML IJ SOLN
0.5000 mg | INTRAMUSCULAR | Status: DC | PRN
  Administered 2020-07-27: 1 mg via INTRAVENOUS
  Filled 2020-07-27: qty 1

## 2020-07-27 MED ORDER — CEFAZOLIN SODIUM-DEXTROSE 2-3 GM-%(50ML) IV SOLR
INTRAVENOUS | Status: DC | PRN
  Administered 2020-07-27: 2 g via INTRAVENOUS

## 2020-07-27 MED ORDER — PHENYLEPHRINE 40 MCG/ML (10ML) SYRINGE FOR IV PUSH (FOR BLOOD PRESSURE SUPPORT)
PREFILLED_SYRINGE | INTRAVENOUS | Status: DC | PRN
  Administered 2020-07-27 (×3): 80 ug via INTRAVENOUS

## 2020-07-27 MED ORDER — FENTANYL CITRATE (PF) 250 MCG/5ML IJ SOLN
INTRAMUSCULAR | Status: DC | PRN
  Administered 2020-07-27 (×2): 50 ug via INTRAVENOUS
  Administered 2020-07-27: 100 ug via INTRAVENOUS

## 2020-07-27 MED ORDER — ONDANSETRON HCL 4 MG/2ML IJ SOLN
INTRAMUSCULAR | Status: AC
  Filled 2020-07-27: qty 2

## 2020-07-27 MED ORDER — SUCCINYLCHOLINE CHLORIDE 200 MG/10ML IV SOSY
PREFILLED_SYRINGE | INTRAVENOUS | Status: AC
  Filled 2020-07-27: qty 10

## 2020-07-27 MED ORDER — ROCURONIUM BROMIDE 100 MG/10ML IV SOLN
INTRAVENOUS | Status: DC | PRN
  Administered 2020-07-27 (×2): 10 mg via INTRAVENOUS
  Administered 2020-07-27: 50 mg via INTRAVENOUS
  Administered 2020-07-27: 20 mg via INTRAVENOUS

## 2020-07-27 MED ORDER — SUGAMMADEX SODIUM 200 MG/2ML IV SOLN
INTRAVENOUS | Status: DC | PRN
  Administered 2020-07-27: 100 mg via INTRAVENOUS
  Administered 2020-07-27: 200 mg via INTRAVENOUS

## 2020-07-27 MED ORDER — ORAL CARE MOUTH RINSE: 15.0000 mL | Freq: Once | OROMUCOSAL | Status: AC

## 2020-07-27 MED ORDER — CHLORHEXIDINE GLUCONATE 0.12 % MT SOLN
15.0000 mL | Freq: Once | OROMUCOSAL | Status: AC
  Administered 2020-07-27: 15 mL via OROMUCOSAL

## 2020-07-27 MED ORDER — PHENYLEPHRINE HCL-NACL 10-0.9 MG/250ML-% IV SOLN
INTRAVENOUS | Status: DC | PRN
  Administered 2020-07-27: 40 ug/min via INTRAVENOUS

## 2020-07-27 MED ORDER — ONDANSETRON HCL 4 MG/2ML IJ SOLN
INTRAMUSCULAR | Status: DC | PRN
  Administered 2020-07-27: 4 mg via INTRAVENOUS

## 2020-07-27 MED ORDER — SUCCINYLCHOLINE CHLORIDE 20 MG/ML IJ SOLN
INTRAMUSCULAR | Status: DC | PRN
  Administered 2020-07-27: 140 mg via INTRAVENOUS

## 2020-07-27 MED ORDER — ROCURONIUM BROMIDE 10 MG/ML (PF) SYRINGE
PREFILLED_SYRINGE | INTRAVENOUS | Status: AC
  Filled 2020-07-27: qty 10

## 2020-07-27 MED ORDER — METHOCARBAMOL 1000 MG/10ML IJ SOLN
500.0000 mg | Freq: Four times a day (QID) | INTRAVENOUS | Status: DC | PRN
  Filled 2020-07-27: qty 5

## 2020-07-27 MED ORDER — PROPOFOL 10 MG/ML IV BOLUS
INTRAVENOUS | Status: DC | PRN
  Administered 2020-07-27: 150 mg via INTRAVENOUS

## 2020-07-27 MED ORDER — CEFAZOLIN SODIUM-DEXTROSE 2-4 GM/100ML-% IV SOLN
2.0000 g | INTRAVENOUS | Status: AC
  Filled 2020-07-27: qty 100

## 2020-07-27 MED ORDER — LIDOCAINE 2% (20 MG/ML) 5 ML SYRINGE
INTRAMUSCULAR | Status: DC | PRN
  Administered 2020-07-27: 100 mg via INTRAVENOUS

## 2020-07-27 MED ORDER — DEXAMETHASONE SODIUM PHOSPHATE 10 MG/ML IJ SOLN
INTRAMUSCULAR | Status: DC | PRN
  Administered 2020-07-27: 5 mg via INTRAVENOUS

## 2020-07-27 MED ORDER — MIDAZOLAM HCL 2 MG/2ML IJ SOLN
INTRAMUSCULAR | Status: AC
  Filled 2020-07-27: qty 2

## 2020-07-27 MED ORDER — FENTANYL CITRATE (PF) 250 MCG/5ML IJ SOLN
INTRAMUSCULAR | Status: AC
Start: 2020-07-27 — End: ?
  Filled 2020-07-27: qty 5

## 2020-07-27 MED ORDER — PROPOFOL 10 MG/ML IV BOLUS
INTRAVENOUS | Status: AC
  Filled 2020-07-27: qty 20

## 2020-07-27 MED ORDER — LIDOCAINE 2% (20 MG/ML) 5 ML SYRINGE
INTRAMUSCULAR | Status: AC
  Filled 2020-07-27: qty 5

## 2020-07-27 MED ORDER — LACTATED RINGERS IV SOLN: INTRAVENOUS | Status: DC

## 2020-07-27 MED ORDER — MIDAZOLAM HCL 5 MG/5ML IJ SOLN
INTRAMUSCULAR | Status: DC | PRN
  Administered 2020-07-27 (×2): 1 mg via INTRAVENOUS

## 2020-07-27 MED ORDER — 0.9 % SODIUM CHLORIDE (POUR BTL) OPTIME
TOPICAL | Status: DC | PRN
  Administered 2020-07-27: 3000 mL

## 2020-07-27 SURGICAL SUPPLY — 46 items
APL PRP STRL LF DISP 70% ISPRP (MISCELLANEOUS) ×1
BLADE CLIPPER SURG (BLADE) IMPLANT
CHLORAPREP W/TINT 26 (MISCELLANEOUS) ×3 IMPLANT
COVER SURGICAL LIGHT HANDLE (MISCELLANEOUS) ×3 IMPLANT
DRAPE LAPAROSCOPIC ABDOMINAL (DRAPES) ×3 IMPLANT
DRAPE WARM FLUID 44X44 (DRAPES) ×3 IMPLANT
DRSG OPSITE POSTOP 4X10 (GAUZE/BANDAGES/DRESSINGS) IMPLANT
DRSG OPSITE POSTOP 4X12 (GAUZE/BANDAGES/DRESSINGS) ×2 IMPLANT
DRSG OPSITE POSTOP 4X8 (GAUZE/BANDAGES/DRESSINGS) IMPLANT
ELECT BLADE 6.5 EXT (BLADE) ×2 IMPLANT
ELECT CAUTERY BLADE 6.4 (BLADE) ×3 IMPLANT
ELECT REM PT RETURN 9FT ADLT (ELECTROSURGICAL) ×3
ELECTRODE REM PT RTRN 9FT ADLT (ELECTROSURGICAL) ×1 IMPLANT
GLOVE BIO SURGEON STRL SZ 6 (GLOVE) ×7 IMPLANT
GLOVE INDICATOR 6.5 STRL GRN (GLOVE) ×5 IMPLANT
GOWN STRL REUS W/ TWL LRG LVL3 (GOWN DISPOSABLE) ×2 IMPLANT
GOWN STRL REUS W/TWL LRG LVL3 (GOWN DISPOSABLE) ×6
HANDLE SUCTION POOLE (INSTRUMENTS) ×1 IMPLANT
KIT BASIN OR (CUSTOM PROCEDURE TRAY) ×3 IMPLANT
KIT TURNOVER KIT B (KITS) ×3 IMPLANT
LIGASURE IMPACT 36 18CM CVD LR (INSTRUMENTS) ×2 IMPLANT
NS IRRIG 1000ML POUR BTL (IV SOLUTION) ×8 IMPLANT
PACK GENERAL/GYN (CUSTOM PROCEDURE TRAY) ×3 IMPLANT
PAD ARMBOARD 7.5X6 YLW CONV (MISCELLANEOUS) ×3 IMPLANT
PENCIL SMOKE EVACUATOR (MISCELLANEOUS) ×3 IMPLANT
RELOAD PROXIMATE 75MM BLUE (ENDOMECHANICALS) ×6 IMPLANT
RELOAD PROXIMATE TA60MM BLUE (ENDOMECHANICALS) ×3 IMPLANT
RELOAD STAPLE 60 BLU REG PROX (ENDOMECHANICALS) IMPLANT
RELOAD STAPLE 75 3.8 BLU REG (ENDOMECHANICALS) IMPLANT
RELOAD STAPLER LINE PROX 60 GR (STAPLE) ×1 IMPLANT
RTRCTR C-SECT PINK 25CM LRG (MISCELLANEOUS) ×2 IMPLANT
SPECIMEN JAR LARGE (MISCELLANEOUS) IMPLANT
SPONGE LAP 18X18 RF (DISPOSABLE) ×4 IMPLANT
STAPLER PROXIMATE 75MM BLUE (STAPLE) ×2 IMPLANT
STAPLER RELOAD LINE PROX 60 GR (STAPLE) ×3
STAPLER RELOADABLE 60 GRN THCK (STAPLE) IMPLANT
STAPLER VISISTAT 35W (STAPLE) ×5 IMPLANT
SUCTION POOLE HANDLE (INSTRUMENTS)
SUT PDS AB 1 TP1 96 (SUTURE) ×6 IMPLANT
SUT SILK 2 0 SH CR/8 (SUTURE) ×1 IMPLANT
SUT SILK 2 0 TIES 10X30 (SUTURE) ×3 IMPLANT
SUT SILK 3 0 SH CR/8 (SUTURE) ×1 IMPLANT
SUT SILK 3 0 TIES 10X30 (SUTURE) ×3 IMPLANT
SUT VIC AB 3-0 SH 18 (SUTURE) IMPLANT
TOWEL GREEN STERILE (TOWEL DISPOSABLE) ×3 IMPLANT
TRAY FOLEY MTR SLVR 16FR STAT (SET/KITS/TRAYS/PACK) ×2 IMPLANT

## 2020-07-27 NOTE — Op Note (Signed)
DATE OF PROCEDURE: 07/27/2020  PREOPERATIVE DIAGNOSIS: Small bowel obstruction   POSTOPERATIVE DIAGNOSIS: Same   PROCEDURE PERFORMED:  Exploratory laparotomy Lysis of adhesions Small bowel resection   SURGEONS: Phylliss Blakes, MD Louisa Second, MD (resident)   ANESTHESIA: General   INDICATIONS: The patient is a 59 y.o. Female with prior hysterectomy that presented with small bowel obstruction that failed non-operative management.    PROCEDURE IN DETAIL:   Following informed consent, the patient was taken to the operating room and was placed in the supine position. Anesthesia was induced and the abdomen was prepped and draped in the usual standard fashion. An operative time out including all members of the operating room staff was conducted.   A midline incision was made and carried down to the fascia with electrocautery.  The fascia was entered sharply. Upon entering the abdomen we encountered dilated small bowel. The small bowel was ran from ligament of Treitz to TI. There were dense adhesions of small bowel pelvis which were taken down with a combination of sharp and blunt dissection. This adhesions were approximately 40 cm from TI. There were two areas in the small bowel which had a stricture, for this reason we elected to resect this segment. The small bowel was transected at this level using the GIA 70. The mesentery was then divided with the Ligasure. We then performed a ileal-ileal anastomosis by opening up the antimesenteric border near the GIA staple lines and placed a GIA 70 with a blue load into each limb to be anastomosed.  We brought the antimesenteric surfaces into apposition and fired the stapler.  We examined the inside of the staple line and found it to be hemostatic.  We staggered the staple lines and closed the common enterotomy with a single fire of a TA 60 stapler with a blue load.  After this, we placed 3-0 silk interrupted sutures in the crotch of the anastomosis.  The mesentery defect was closed with interrupted 3-0 silk sutures. The abdomen was irrigated with 2L of warm normal saline. NGT placement was confirmed. The fascia was closed with #1 loop maxon. Skin was closed with staples. The incision was covered with Opsite dressing. All counts were correct at the end of the procedure. Patient was extubated it and taken to PACU in stable condition.    ESTIMATED BLOOD LOSS: 20 cc   SPECIMENS: Small bowel   DRAINS: None   COMPLICATIONS: None

## 2020-07-27 NOTE — Progress Notes (Signed)
Patient refused NGT, stated "would rather deal with the pain than have tube", on call MD notified.

## 2020-07-27 NOTE — Transfer of Care (Signed)
Immediate Anesthesia Transfer of Care Note  Patient: Tanya [name]  Procedure(s) Performed: EXPLORATORY LAPAROTOMY, SMALL BOWEL RESECTION (N/A Abdomen)  Patient Location: PACU  Anesthesia Type:General  Level of Consciousness: drowsy  Airway & Oxygen Therapy: Patient Spontanous Breathing and Patient connected to nasal cannula oxygen  Post-op Assessment: Report given to RN and Post -op Vital signs reviewed and stable  Post vital signs: Reviewed  Last Vitals:  Vitals Value Taken Time  BP 161/85 07/27/20 1406  Temp 36.1 C 07/27/20 1405  Pulse 72 07/27/20 1411  Resp 21 07/27/20 1411  SpO2 100 % 07/27/20 1411  Vitals shown include unvalidated device data.  Last Pain:  Vitals:   07/27/20 1405  TempSrc:   PainSc: 0-No pain         Complications: No complications documented.

## 2020-07-27 NOTE — Anesthesia Postprocedure Evaluation (Signed)
Anesthesia Post Note  Patient: Tanya [name]  Procedure(s) Performed: EXPLORATORY LAPAROTOMY, SMALL BOWEL RESECTION (N/A Abdomen)     Patient location during evaluation: PACU Anesthesia Type: General Level of consciousness: awake Pain management: pain level controlled Vital Signs Assessment: post-procedure vital signs reviewed and stable Respiratory status: spontaneous breathing Cardiovascular status: stable Postop Assessment: no apparent nausea or vomiting Anesthetic complications: no   No complications documented.  Last Vitals:  Vitals:   07/27/20 1420 07/27/20 1448  BP: (!) 162/85 (!) 143/79  Pulse: 77 79  Resp: 14 15  Temp: (!) 36.4 C 36.6 C  SpO2: 100% 98%    Last Pain:  Vitals:   07/27/20 1420  TempSrc:   PainSc: 0-No pain                 Chevez Sambrano

## 2020-07-27 NOTE — Progress Notes (Addendum)
OVERNIGHT ROUNDING NOTE:  59yo female admitted to [facility] on 9/22 for management of small bowel obstruction. NG was placed on admission. It was pulled today because it was almost out. General surgery is co-managing. Paged by bedside RN for patient's complaint of abdominal pain.  On further discussion with the patient, she notes that the abdominal pain is similar to the pain she had on admission. Denies nausea or vomiting. She has only been taking ice chips. She has not passed stool yet. On exam, she was ill but not toxic appearing. Abdomen diffusely tender but without rebound tenderness.   Plan: Discussed with general surgery on call provider who recommends replacing the NG if patient is able to tolerate this. Will obtain KUB following placement.   Elige Radon, MD Internal Medicine Resident PGY-2 [facility] Pager: [phone] 07/27/2020 12:25 AM     Addendum:  Received page from patient's nurse who reports the patient is refusing the NGT, would "rather deal with the pain than have the tube." Will obtain KUB per surgery.  Alphonzo Severance, MD Internal Medicine Resident PGY-1 Pager: [phone]

## 2020-07-27 NOTE — Progress Notes (Signed)
   Subjective:   NG tube removed yesterday because it had risen up. Patient states that she did okay until yesterday evening when she had increasing abd pain. Has stopped eating ice chips due to discomfort. No BM yet, had some gas yesterday but none yet today. Denies CP, SOB, vomiting. Patient agreeable to surgery today if needed.   Objective:  Vital signs in last 24 hours: Vitals:   07/26/20 0404 07/26/20 1400 07/26/20 2029 07/27/20 0429  BP: (!) 172/83 (!) 182/79 (!) 174/79 (!) 176/77  Pulse: 68 71 64 68  Resp: 18  17 18   Temp: 98.2 F (36.8 C) 98 F (36.7 C) 98.4 F (36.9 C) 98.5 F (36.9 C)  TempSrc: Oral Oral Oral Oral  SpO2: 98% 100% 99% 99%  Weight:      Height:       Constitutional: no acute distress but uncomfortable Head: atraumatic ENT: external ears normal, NG tube removed Eyes: EOMI Cardiovascular: regular rate and rhythm, normal heart sounds, no LE edema Pulmonary: effort normal, lungs clear to ascultation bilaterally Abdominal: flat, diffusely tender with moderate RUQ and epigastric tenderness, no rebound tenderness, bowel sounds increased compared to prior Skin: warm and dry Neurological: alert, no focal deficit Psychiatric: normal mood and affect  Assessment/Plan: Tanya [name] is a 59 y.o. female with hx of sarcoidosis, HTN, HLD, DM, OSA presenting for abdominal pain and distention, imaging and exam consistent with small bowel obstruction. Currently pursuing conservative management, considering surgery in near future.  Principal Problem:   Small bowel obstruction (HCC) Active Problems:   Hypertension   Hyperlipidemia associated with type 2 diabetes mellitus (HCC)   Type 2 diabetes mellitus with obesity (HCC)   Obesity (BMI 30.0-34.9)  Small bowel obstruction Presents with 5 days of abd pain, distension, n/v, worsening of Sx with PO intake. Last BM 9/15. Surgical hx of appendectomy and abdominal hysterectomy. CT with evidence of SBO with transition  point in RLQ near cecum. Currently pursuing conservative management. Most recent KUB with no contrast, dilated bowel loops.  -surgery following, appreciate assistance     -likely plan for ex-lap today -continue IV fluid  -Zofran prn -Continue IV PPI  Essential HTN Hypertensive but asymptomatic.  -continue clonidine patch 0.3mg    Question of heart failure Is on Lasix 40mg  daily at home. She denies hx of heart failure and unable to find documentation of this in the chart. Echo in 2017 with EF 60-65% and normal diastolic function. No signs of volume overload on exam. -hold Lasix for now  DM Controlled with metformin only at home. Last A1c 6.2 on 04/30/20. BG ~120-130s today. No hypoglycemic event -continue SSI  HLD On olmasartan 40mg  at home.  -resume at discharge      Diet:  NPO  IVF:  LR 125 ml/h VTE:  lovenox Prior to Admission Living Arrangement:  home Anticipated Discharge Location:  home Barriers to Discharge:  SBO Dispo: Anticipated discharge depends on clinical progress  2018, MD 07/27/2020, 7:08 AM Pager: [phone] After 5pm on weekdays and 1pm on weekends: On Call pager [phone]

## 2020-07-27 NOTE — Progress Notes (Signed)
Patient c/o abdominal pain 7/10,states it's the same pain she had when she was admitted. Notified on call MD.

## 2020-07-27 NOTE — Progress Notes (Signed)
[facility] Progress Note     Subjective: CC:  NG tube was partially pulled out yesterday so we D/C-ed. Since NG was removed patient has had increased pain. She has stopped having flatus. Denies BM. States she was taking in hard candy/ice chips but has stopped because she has increased discomfort with digestion.    Objective: Vital signs in last 24 hours: Temp:  [98 F (36.7 C)-98.5 F (36.9 C)] 98.5 F (36.9 C) (09/26 0429) Pulse Rate:  [64-71] 68 (09/26 0429) Resp:  [17-18] 18 (09/26 0429) BP: (174-182)/(77-79) 176/77 (09/26 0429) SpO2:  [99 %-100 %] 99 % (09/26 0429) Last BM Date: 07/19/20  Intake/Output from previous day: 09/25 0701 - 09/26 0700 In: 1564.3 [P.O.:160; I.V.:1404.3] Out: -  Intake/Output this shift: No intake/output data recorded.  PE: Gen:  Alert, NAD, pleasant Card:  Regular rate and rhythm, pedal pulses 2+ BL Pulm:  Normal effort, clear to auscultation bilaterally Abd: Soft, non-tender, increased distention compared to my exam yesterday, hypoactive BS. Skin: warm and dry, no rashes  Psych: A&Ox3   Lab Results:  Recent Labs    07/26/20 0244 07/27/20 0548  WBC 6.5 5.6  HGB 11.5* 11.0*  HCT 35.2* 34.2*  PLT 347 352   BMET Recent Labs    07/26/20 0244 07/27/20 0548  NA 141 136  K 3.8 3.4*  CL 96* 100  CO2 27 27  GLUCOSE 77 138*  BUN 16 7  CREATININE 0.60 0.56  CALCIUM 8.5* 8.3*   PT/INR No results for input(s): LABPROT, INR in the last 72 hours. CMP     Component Value Date/Time   NA 136 07/27/2020 0548   K 3.4 (L) 07/27/2020 0548   CL 100 07/27/2020 0548   CO2 27 07/27/2020 0548   GLUCOSE 138 (H) 07/27/2020 0548   BUN 7 07/27/2020 0548   CREATININE 0.56 07/27/2020 0548   CREATININE 0.66 04/30/2020 0913   CALCIUM 8.3 (L) 07/27/2020 0548   PROT 6.3 (L) 07/24/2020 0309   ALBUMIN 3.4 (L) 07/24/2020 0309   AST 18 07/24/2020 0309   ALT 9 07/24/2020 0309   ALKPHOS 35 (L) 07/24/2020 0309   BILITOT 1.2 07/24/2020  0309   GFRNONAA >60 07/27/2020 0548   GFRNONAA 96 04/30/2020 0913   GFRAA >60 07/27/2020 0548   GFRAA 111 04/30/2020 0913   Lipase     Component Value Date/Time   LIPASE 19 07/23/2020 0842       Studies/Results: DG Abd Portable 1V  Result Date: 07/27/2020 CLINICAL DATA:  Follow-up small bowel obstruction. EXAM: PORTABLE ABDOMEN - 1 VIEW COMPARISON:  07/26/2020 FINDINGS: Mildly distended gas-filled loops of small bowel are again noted with a few more distended loops now identified. No other changes are noted. IMPRESSION: Slightly distended small bowel loops, now with a few more distended loops identified. Electronically Signed   By: Harmon Pier M.D.   On: 07/27/2020 07:27   DG Abd Portable 1V  Result Date: 07/26/2020 CLINICAL DATA:  Small bowel obstruction. EXAM: PORTABLE ABDOMEN - 1 VIEW COMPARISON:  07/25/2020 FINDINGS: Nasogastric tube is again seen with tip overlying the proximal stomach. Mildly dilated bowel loop is seen in the lower abdomen, which may represent a small bowel loop. Scattered gas also seen throughout the colon. IMPRESSION: Mildly dilated bowel loop in lower abdomen, which may represent small bowel. Nasogastric tube tip in the proximal stomach. Electronically Signed   By: Danae Orleans M.D.   On: 07/26/2020 10:08   DG Abd Portable 1V  Result Date: 07/25/2020 CLINICAL DATA:  Small bowel obstruction. EXAM: PORTABLE ABDOMEN - 1 VIEW COMPARISON:  July 24, 2020. FINDINGS: Mildly dilated small bowel loops are noted in the left lower quadrant which may represent ileus, although small-bowel obstruction cannot be excluded. No colonic dilatation is noted. Nasogastric tube tip is seen in proximal stomach. Phleboliths noted in the pelvis. IMPRESSION: Mildly dilated small bowel loops are noted in left lower quadrant which may represent ileus, although small-bowel obstruction cannot be excluded. Nasogastric tube tip is seen in proximal stomach. Electronically Signed   By: Lupita Raider M.D.   On: 07/25/2020 08:51    Anti-infectives: Anti-infectives (From admission, onward)   Start     Dose/Rate Route Frequency Ordered Stop   07/27/20 0815  ceFAZolin (ANCEF) IVPB 2g/100 mL premix        2 g 200 mL/hr over 30 Minutes Intravenous On call to O.R. 07/27/20 5916 07/28/20 0559     Assessment/Plan HTN HLD T2DM Hx of Sarcoidosis Obesity - BMI 31.95  Hx of open appendectomy and abdominal hysterectomy  SBO - SB protocol 9/23 >> contrast did not progress to colon - NG was accidentally pulled out 9/25, monitored the patient with NG removed and her obstructive sxs have returned. No bowel fxn this AM. - KUB this AM with no visible contrast. Mild increase in dilated loops of small bowel. - clinically patient remains obstructed, she did not improve with non-operative management, I recommend she go to the OR for exploratory laparotomy, possible bowel resection.  FEN: NPO VTE: Lovenox  ID: 2g Ancef on call to OR  The risks of surgery included bleeding, infection, damage to surrounding structures, need for additional procedures, MI, CVA, respiratory complications, and death were discussed with patient and she is willing to proceed with surgery.       LOS: 4 days    Hosie Spangle, [facility] Please see Amion for pager number during day hours 7:00am-4:30pm

## 2020-07-27 NOTE — Anesthesia Procedure Notes (Signed)
Procedure Name: Intubation Date/Time: 07/27/2020 12:14 PM Performed by: Janene Harvey, CRNA Pre-anesthesia Checklist: Patient identified, Emergency Drugs available, Suction available and Patient being monitored Patient Re-evaluated:Patient Re-evaluated prior to induction Oxygen Delivery Method: Circle system utilized Preoxygenation: Pre-oxygenation with 100% oxygen Induction Type: IV induction and Rapid sequence Laryngoscope Size: Mac and 4 Grade View: Grade I Tube type: Oral Tube size: 7.0 mm Number of attempts: 1 Airway Equipment and Method: Stylet and Oral airway Placement Confirmation: ETT inserted through vocal cords under direct vision,  positive ETCO2 and breath sounds checked- equal and bilateral Secured at: 22 cm Tube secured with: Tape Dental Injury: Teeth and Oropharynx as per pre-operative assessment

## 2020-07-27 NOTE — Anesthesia Preprocedure Evaluation (Addendum)
Anesthesia Evaluation  Patient identified by MRN, date of birth, ID band Patient awake    Reviewed: Allergy & Precautions, NPO status , Patient's Chart, lab work & pertinent test results  Airway Mallampati: II  TM Distance: >3 FB     Dental   Pulmonary sleep apnea ,    breath sounds clear to auscultation       Cardiovascular hypertension,  Rhythm:Regular Rate:Normal     Neuro/Psych  Neuromuscular disease    GI/Hepatic Neg liver ROS, History noted CG   Endo/Other  diabetes  Renal/GU negative Renal ROS     Musculoskeletal   Abdominal   Peds  Hematology   Anesthesia Other Findings   Reproductive/Obstetrics                             Anesthesia Physical Anesthesia Plan  ASA: III  Anesthesia Plan: General   Post-op Pain Management:    Induction: Intravenous  PONV Risk Score and Plan: 3 and Ondansetron, Dexamethasone and Midazolam  Airway Management Planned: Oral ETT  Additional Equipment:   Intra-op Plan:   Post-operative Plan: Extubation in OR  Informed Consent:     Dental advisory given  Plan Discussed with: CRNA and Anesthesiologist  Anesthesia Plan Comments:         Anesthesia Quick Evaluation

## 2020-07-28 ENCOUNTER — Encounter (HOSPITAL_COMMUNITY): Payer: Self-pay | Admitting: Surgery

## 2020-07-28 LAB — BASIC METABOLIC PANEL
Anion gap: 8 (ref 5–15)
BUN: <5 mg/dL — ABNORMAL LOW (ref 6–20)
CO2: 28 mmol/L (ref 22–32)
Calcium: 8 mg/dL — ABNORMAL LOW (ref 8.9–10.3)
Chloride: 101 mmol/L (ref 98–111)
Creatinine, Ser: 0.5 mg/dL (ref 0.44–1.00)
GFR calc Af Amer: >60 mL/min (ref >60)
GFR calc non Af Amer: >60 mL/min (ref >60)
Glucose, Bld: 178 mg/dL — ABNORMAL HIGH (ref 70–99)
Potassium: 3.6 mmol/L (ref 3.5–5.1)
Sodium: 137 mmol/L (ref 135–145)

## 2020-07-28 LAB — MAGNESIUM: Magnesium: 1.8 mg/dL (ref 1.7–2.4)

## 2020-07-28 LAB — GLUCOSE, CAPILLARY
Glucose-Capillary: 106 mg/dL — ABNORMAL HIGH (ref 70–99)
Glucose-Capillary: 131 mg/dL — ABNORMAL HIGH (ref 70–99)
Glucose-Capillary: 145 mg/dL — ABNORMAL HIGH (ref 70–99)
Glucose-Capillary: 150 mg/dL — ABNORMAL HIGH (ref 70–99)
Glucose-Capillary: 153 mg/dL — ABNORMAL HIGH (ref 70–99)
Glucose-Capillary: 163 mg/dL — ABNORMAL HIGH (ref 70–99)

## 2020-07-28 LAB — CBC
HCT: 38.6 % (ref 36.0–46.0)
Hemoglobin: 12.4 g/dL (ref 12.0–15.0)
MCH: 25.7 pg — ABNORMAL LOW (ref 26.0–34.0)
MCHC: 32.1 g/dL (ref 30.0–36.0)
MCV: 80.1 fL (ref 80.0–100.0)
Platelets: 398 10*3/uL (ref 150–400)
RBC: 4.82 MIL/uL (ref 3.87–5.11)
RDW: 14.8 % (ref 11.5–15.5)
WBC: 9.5 10*3/uL (ref 4.0–10.5)
nRBC: 0 % (ref 0.0–0.2)

## 2020-07-28 MED ORDER — POTASSIUM CHLORIDE 20 MEQ PO PACK: 20.0000 meq | PACK | Freq: Two times a day (BID) | ORAL | Status: DC

## 2020-07-28 MED ORDER — LACTATED RINGERS IV SOLN: INTRAVENOUS | Status: DC

## 2020-07-28 MED ORDER — ACETAMINOPHEN 500 MG PO TABS
1000.0000 mg | ORAL_TABLET | Freq: Four times a day (QID) | ORAL | Status: DC
  Administered 2020-07-28 – 2020-08-01 (×11): 1000 mg via ORAL
  Filled 2020-07-28 (×16): qty 2

## 2020-07-28 MED ORDER — POTASSIUM CHLORIDE 10 MEQ/100ML IV SOLN
10.0000 meq | INTRAVENOUS | Status: AC
  Administered 2020-07-28 (×4): 10 meq via INTRAVENOUS
  Filled 2020-07-28 (×3): qty 100

## 2020-07-28 NOTE — Progress Notes (Signed)
[facility] Progress Note  1 Day Post-Op  Subjective: CC:  NAEO. Mild abdominal soreness, has not been out of bed yet. Foley removed this AM - has not voided yet. Denies flatus or BM. <100 cc in NG cannister  Objective: Vital signs in last 24 hours: Temp:  [97 F (36.1 C)-98.8 F (37.1 C)] 98.8 F (37.1 C) (09/27 0408) Pulse Rate:  [77-93] 93 (09/27 0408) Resp:  [14-18] 18 (09/27 0408) BP: (143-176)/(79-92) 175/91 (09/27 0408) SpO2:  [95 %-100 %] 96 % (09/27 0408) Last BM Date: 07/19/20  Intake/Output from previous day: 09/26 0701 - 09/27 0700 In: 1900.1 [I.V.:1850.1; IV Piggyback:50] Out: 1475 [Urine:1125; Emesis/NG output:50; Blood:100] Intake/Output this shift: No intake/output data recorded.  PE: Gen:  Alert, NAD, pleasant Card:  Regular rate and rhythm, pedal pulses 2+ BL Pulm:  Normal effort, clear to auscultation bilaterally Abd: Soft, appropriately tender, honeycomb dressing in place with small amt dried sanguinous drainage. +BS  <100 cc in NG cannister, clear liquid in tubing. NG clamped during my exam. Skin: warm and dry, no rashes  Psych: A&Ox3   Lab Results:  Recent Labs    07/27/20 0548 07/28/20 0145  WBC 5.6 9.5  HGB 11.0* 12.4  HCT 34.2* 38.6  PLT 352 398   BMET Recent Labs    07/27/20 0548 07/28/20 0145  NA 136 137  K 3.4* 3.6  CL 100 101  CO2 27 28  GLUCOSE 138* 178*  BUN 7 <5*  CREATININE 0.56 0.50  CALCIUM 8.3* 8.0*   PT/INR No results for input(s): LABPROT, INR in the last 72 hours. CMP     Component Value Date/Time   NA 137 07/28/2020 0145   K 3.6 07/28/2020 0145   CL 101 07/28/2020 0145   CO2 28 07/28/2020 0145   GLUCOSE 178 (H) 07/28/2020 0145   BUN <5 (L) 07/28/2020 0145   CREATININE 0.50 07/28/2020 0145   CREATININE 0.66 04/30/2020 0913   CALCIUM 8.0 (L) 07/28/2020 0145   PROT 6.3 (L) 07/24/2020 0309   ALBUMIN 3.4 (L) 07/24/2020 0309   AST 18 07/24/2020 0309   ALT 9 07/24/2020 0309   ALKPHOS 35 (L)  07/24/2020 0309   BILITOT 1.2 07/24/2020 0309   GFRNONAA >60 07/28/2020 0145   GFRNONAA 96 04/30/2020 0913   GFRAA >60 07/28/2020 0145   GFRAA 111 04/30/2020 0913   Lipase     Component Value Date/Time   LIPASE 19 07/23/2020 0842    Studies/Results: DG Abd Portable 1V  Result Date: 07/27/2020 CLINICAL DATA:  Follow-up small bowel obstruction. EXAM: PORTABLE ABDOMEN - 1 VIEW COMPARISON:  07/26/2020 FINDINGS: Mildly distended gas-filled loops of small bowel are again noted with a few more distended loops now identified. No other changes are noted. IMPRESSION: Slightly distended small bowel loops, now with a few more distended loops identified. Electronically Signed   By: Harmon Pier M.D.   On: 07/27/2020 07:27   DG Abd Portable 1V  Result Date: 07/26/2020 CLINICAL DATA:  Small bowel obstruction. EXAM: PORTABLE ABDOMEN - 1 VIEW COMPARISON:  07/25/2020 FINDINGS: Nasogastric tube is again seen with tip overlying the proximal stomach. Mildly dilated bowel loop is seen in the lower abdomen, which may represent a small bowel loop. Scattered gas also seen throughout the colon. IMPRESSION: Mildly dilated bowel loop in lower abdomen, which may represent small bowel. Nasogastric tube tip in the proximal stomach. Electronically Signed   By: Danae Orleans M.D.   On: 07/26/2020 10:08    Anti-infectives:  Anti-infectives (From admission, onward)   Start     Dose/Rate Route Frequency Ordered Stop   07/27/20 0815  ceFAZolin (ANCEF) IVPB 2g/100 mL premix        2 g 200 mL/hr over 30 Minutes Intravenous On call to O.R. 07/27/20 8341 07/28/20 0559     Assessment/Plan HTN HLD T2DM Hx of Sarcoidosis Obesity - BMI 31.95  Hx of open appendectomy and abdominal hysterectomy  SBO S/P exploratory laparotomy, lysis of adhesions, 20cm small bowel resection 07/27/20 Dr. Fredricka Bonine  - POD#1, AFVSS, WBC 9, H/H 12.4/38.6  - await return of bowel function - minimal NG output, clamp trial and remove this afternoon  if minimal residual - OOB/mobilize, IS 10x q 1h  FEN: NPO, clamp trial, give 40 mEq KCl, goal K > 4.0 VTE: Lovenox  ID: 2g Ancef 9/26 Foley: removed this morning Dispo: Med-Surg, inpatient     LOS: 5 days    Hosie Spangle, [facility] Please see Amion for pager number during day hours 7:00am-4:30pm

## 2020-07-28 NOTE — Progress Notes (Signed)
Received from [facility]   Patient alert and oriented x 4  Patient resting upon entering the room but aroused easily.  IV infusing L FA.  Bedside table and call bell in reach for patient safety.   PA clamped NGT at 0800, will check for residual at 1300.

## 2020-07-28 NOTE — Plan of Care (Signed)

## 2020-07-28 NOTE — Progress Notes (Signed)
   Subjective:   Patient reports having some abdominal pain, which is dull. At this point tolerable with tylenol alone, but discussed that she has breakthrough meds ordered prn if needed. Has not felt herself passing gas yet. She reports feeling good otherwise. Denies any SOB, nausea, vomiting, or other issues.  Objective:  Vital signs in last 24 hours: Vitals:   07/27/20 1448 07/27/20 2025 07/28/20 0209 07/28/20 0408  BP: (!) 143/79 (!) 175/86 (!) 176/92 (!) 175/91  Pulse: 79 91 86 93  Resp: 15 18 17 18   Temp: 97.8 F (36.6 C) 98.4 F (36.9 C) 97.6 F (36.4 C) 98.8 F (37.1 C)  TempSrc:  Oral Oral Oral  SpO2: 98% 95% 98% 96%  Weight:      Height:       Constitutional: no acute distress but uncomfortable Head: atraumatic ENT: external ears normal, NG tube clamped, small amount of drainage in suction canister Eyes: EOMI Cardiovascular: regular rate and rhythm, normal heart sounds, no LE edema Pulmonary: effort normal, lungs clear to ascultation bilaterally Abdominal: flat, surgical wound covered with dressing with small amount of bleeding, some bowel sounds present Skin: warm and dry Neurological: alert, no focal deficit Psychiatric: normal mood and affect  Assessment/Plan: Tanya [name] is a 59 y.o. female with hx of sarcoidosis, HTN, HLD, DM, OSA presenting for abdominal pain and distention, imaging and exam consistent with small bowel obstruction. S/p ex lap with removal of 20cm of small bowel.  Principal Problem:   Small bowel obstruction (HCC) Active Problems:   Hypertension   Hyperlipidemia associated with type 2 diabetes mellitus (HCC)   Type 2 diabetes mellitus with obesity (HCC)   Obesity (BMI 30.0-34.9)  Small bowel obstruction s/p ex lap with small bowel resection POD 1. Surgical hx of appendectomy and abdominal hysterectomy. CT with evidence of SBO with transition point in RLQ near cecum. -surgery following, appreciate assistance -NPO -NG tube clamped,  remove this afternoon -c/w IV fluid  -c/w Zofran prn -c/w IV PPI -pain control with tylenol and dilaudid q2hr -replete K with goal >4.0, [birth year]1993 ordered today  Essential HTN Hypertensive but asymptomatic.  -continue clonidine patch 0.3mg    DM Controlled with metformin only at home. Last A1c 6.2 on 04/30/20. BG ~160-170s today. No hypoglycemic event -continue SSI  HLD On olmasartan 40mg  at home.  -resume at discharge      Diet:  NPO  IVF:  D5 NS VTE:  lovenox Prior to Admission Living Arrangement:  home Anticipated Discharge Location:  home Barriers to Discharge:  Recovery from small bowel resection Dispo: Anticipated discharge in 3-4 days  05/02/20, MD 07/28/2020, 7:19 AM Pager: [phone] After 5pm on weekdays and 1pm on weekends: On Call pager [phone]

## 2020-07-29 LAB — BASIC METABOLIC PANEL
Anion gap: 8 (ref 5–15)
BUN: 6 mg/dL (ref 6–20)
CO2: 26 mmol/L (ref 22–32)
Calcium: 8 mg/dL — ABNORMAL LOW (ref 8.9–10.3)
Chloride: 103 mmol/L (ref 98–111)
Creatinine, Ser: 0.49 mg/dL (ref 0.44–1.00)
GFR calc Af Amer: >60 mL/min (ref >60)
GFR calc non Af Amer: >60 mL/min (ref >60)
Glucose, Bld: 125 mg/dL — ABNORMAL HIGH (ref 70–99)
Potassium: 3.5 mmol/L (ref 3.5–5.1)
Sodium: 137 mmol/L (ref 135–145)

## 2020-07-29 LAB — URINALYSIS, ROUTINE W REFLEX MICROSCOPIC
Bacteria, UA: NONE SEEN
Glucose, UA: NEGATIVE mg/dL
Ketones, ur: 20 mg/dL — AB
Leukocytes,Ua: NEGATIVE
Nitrite: NEGATIVE
Protein, ur: 30 mg/dL — AB
Specific Gravity, Urine: 1.034 — ABNORMAL HIGH (ref 1.005–1.030)
pH: 5 (ref 5.0–8.0)

## 2020-07-29 LAB — CBC
HCT: 32.2 % — ABNORMAL LOW (ref 36.0–46.0)
Hemoglobin: 10.4 g/dL — ABNORMAL LOW (ref 12.0–15.0)
MCH: 25.9 pg — ABNORMAL LOW (ref 26.0–34.0)
MCHC: 32.3 g/dL (ref 30.0–36.0)
MCV: 80.3 fL (ref 80.0–100.0)
Platelets: 381 10*3/uL (ref 150–400)
RBC: 4.01 MIL/uL (ref 3.87–5.11)
RDW: 15 % (ref 11.5–15.5)
WBC: 9.3 10*3/uL (ref 4.0–10.5)
nRBC: 0 % (ref 0.0–0.2)

## 2020-07-29 LAB — GLUCOSE, CAPILLARY
Glucose-Capillary: 143 mg/dL — ABNORMAL HIGH (ref 70–99)
Glucose-Capillary: 115 mg/dL — ABNORMAL HIGH (ref 70–99)
Glucose-Capillary: 149 mg/dL — ABNORMAL HIGH (ref 70–99)
Glucose-Capillary: 149 mg/dL — ABNORMAL HIGH (ref 70–99)
Glucose-Capillary: 97 mg/dL (ref 70–99)
Glucose-Capillary: 99 mg/dL (ref 70–99)

## 2020-07-29 LAB — SURGICAL PATHOLOGY

## 2020-07-29 MED ORDER — IBUPROFEN 200 MG PO TABS
200.0000 mg | ORAL_TABLET | ORAL | Status: DC | PRN
  Administered 2020-07-30 – 2020-07-31 (×2): 200 mg via ORAL
  Filled 2020-07-29 (×2): qty 1

## 2020-07-29 MED ORDER — ENOXAPARIN SODIUM 40 MG/0.4ML ~~LOC~~ SOLN
40.0000 mg | Freq: Every day | SUBCUTANEOUS | Status: DC
  Administered 2020-07-29 – 2020-08-01 (×4): 40 mg via SUBCUTANEOUS
  Filled 2020-07-29 (×4): qty 0.4

## 2020-07-29 MED ORDER — POTASSIUM CHLORIDE 10 MEQ/100ML IV SOLN
10.0000 meq | INTRAVENOUS | Status: AC
  Administered 2020-07-29 (×6): 10 meq via INTRAVENOUS
  Filled 2020-07-29 (×3): qty 100

## 2020-07-29 MED ORDER — MAGNESIUM SULFATE 2 GM/50ML IV SOLN
2.0000 g | Freq: Once | INTRAVENOUS | Status: AC
  Administered 2020-07-29: 2 g via INTRAVENOUS
  Filled 2020-07-29 (×2): qty 50

## 2020-07-29 NOTE — Progress Notes (Signed)
   Subjective:   Ms. Cristina states that she feels well and has a strong appetite. She says she has tolerated sips of water well so far. No BM or gas so far. She says her pain is under control on Tylenol. She says she has not yet passed gas. She is motivated to try getting up and walking around today. Denies CP or SOB.  Objective:  Vital signs in last 24 hours: Vitals:   07/28/20 0957 07/28/20 1400 07/28/20 2030 07/29/20 0408  BP: (!) 166/96 (!) 183/88 (!) 171/90 (!) 160/80  Pulse: 92 89 92 98  Resp: 18 20 18 17   Temp: 98.4 F (36.9 C) 98.2 F (36.8 C) (!) 97.5 F (36.4 C) 98.6 F (37 C)  TempSrc: Oral Oral Oral Oral  SpO2: 96% 97% 97% 96%  Weight:      Height:       Constitutional: no acute distress  Head: atraumatic ENT: external ears normal, NG tube removed Eyes: EOMI Cardiovascular: regular rate and rhythm, normal heart sounds, no LE edema Pulmonary: effort normal, lungs clear to ascultation bilaterally Abdominal: flat, surgical wound covered with dressing with small amount of bleeding, some bowel sounds present Skin: warm and dry Neurological: alert, no focal deficit Psychiatric: normal mood and affect  Assessment/Plan: Tanya [name] is a 59 y.o. female with hx of sarcoidosis, HTN, HLD, DM, OSA presenting for abdominal pain and distention, imaging and exam consistent with small bowel obstruction. S/p ex lap with removal of 20cm of small bowel. Pending return of bowel function.  Principal Problem:   Small bowel obstruction (HCC) Active Problems:   Hypertension   Hyperlipidemia associated with type 2 diabetes mellitus (HCC)   Type 2 diabetes mellitus with obesity (HCC)   Obesity (BMI 30.0-34.9)  Small bowel obstruction s/p ex lap with small bowel resection POD 2. Surgical hx of appendectomy and abdominal hysterectomy. CT with evidence of SBO with transition point in RLQ near cecum. NG tube removed. Pending return of bowel function. -surgery following, appreciate  assistance -NPO except for ice chips and small sips -c/w IV fluid  -c/w Zofran prn -c/w IV PPI -pain control with tylenol, ibuprofen, and dilaudid q2hr -K 3.5 today, repleting with goal >4.0. Also repleted Mg today.  Essential HTN Hypertensive but asymptomatic.  -continue clonidine patch 0.3mg    DM Controlled with metformin only at home. Last A1c 6.2 on 04/30/20. BG ~160-170s today. No hypoglycemic event -continue SSI  HLD On olmasartan 40mg  at home.  -resume at discharge      Diet:  NPO  IVF:  D5 NS VTE:  lovenox Prior to Admission Living Arrangement:  home Anticipated Discharge Location:  home Barriers to Discharge:  Recovery from small bowel resection Dispo: Anticipated discharge in 2-3 days  05/02/20, MD 07/29/2020, 6:59 AM Pager: [phone] After 5pm on weekdays and 1pm on weekends: On Call pager [phone]

## 2020-07-29 NOTE — Discharge Summary (Addendum)
Name: Tanya SaltsManya R [name] MRN: [id number] DOB: 01/12/1961 59 y.o. PCP: Lucky CowboyMcKeown, William, MD  Date of Admission: 07/23/2020  8:31 AM Date of Discharge: 08/01/2020 Attending Physician: Jessy OtoAlexander Raines, MD, PhD   Discharge Diagnosis: 1. Small bowel obstruction 2. Essential Hypertension 3. Diabetes Mellitus 4. Hyperlipidemia  Discharge Medications: Allergies as of 08/01/2020       Reactions   Amlodipine Swelling   Naproxen Hives   Meloxicam & Diet Manage Prod Rash        Medication List     STOP taking these medications    ciprofloxacin 500 MG tablet Commonly known as: CIPRO       TAKE these medications    aspirin EC 81 MG tablet Take 81 mg by mouth daily.   bisoprolol-hydrochlorothiazide 10-6.25 MG tablet Commonly known as: ZIAC TAKE 1 TABLET DAILY FOR BLOOD PRESSURE What changed:  how much to take how to take this when to take this   cloNIDine 0.2 MG tablet Commonly known as: CATAPRES TAKE 1 TABLET BY MOUTH TWICE A DAY FOR BLOOD PRESSURE What changed:  how much to take how to take this when to take this   diclofenac sodium 1 % Gel Commonly known as: Voltaren Apply 2 g topically 4 (four) times daily. What changed:  when to take this reasons to take this   docusate sodium 100 MG capsule Commonly known as: COLACE Take 1 capsule (100 mg total) by mouth 2 (two) times daily.   fluticasone 50 MCG/ACT nasal spray Commonly known as: FLONASE Place 2 sprays into both nostrils daily. What changed:  when to take this reasons to take this   furosemide 40 MG tablet Commonly known as: LASIX Take 1 tablet    2 x /day    for BP  and Fluid Retention / Ankle Swelling What changed:  how much to take how to take this when to take this   gabapentin 100 MG capsule Commonly known as: NEURONTIN Take 1 capsule 3 times a day for Pain What changed:  how much to take how to take this when to take this reasons to take this   hydrALAZINE 25 MG tablet Commonly  known as: APRESOLINE Take 1 tablet (25 mg total) by mouth 3 (three) times daily.   Magnesium 250 MG Tabs Take 250 mg by mouth daily.   metFORMIN 500 MG 24 hr tablet Commonly known as: GLUCOPHAGE-XR Take 2 tablets 2 x /day with Meals for Diabetes What changed:  how much to take how to take this when to take this   MULTIVITAMIN PO Take by mouth daily.   olmesartan 40 MG tablet Commonly known as: BENICAR TAKE 1 TABLET DAILY FOR BP & DIABETIC KIDNEY PROTECTION What changed: See the new instructions.   phentermine 37.5 MG tablet Commonly known as: ADIPEX-P TAKE 1/2 TO 1 TABLET EVERY MORNING FOR DIETING & WEIGHTLOSS   polyethylene glycol 17 g packet Commonly known as: MIRALAX / GLYCOLAX Take 17 g by mouth daily.   Restasis 0.05 % ophthalmic emulsion Generic drug: cycloSPORINE Place 1 drop into both eyes 3 (three) times daily as needed for dry eyes.   VITAMIN B-12 SL Place 1 tablet under the tongue daily.   Vitamin D (Ergocalciferol) 1.25 MG (50000 UNIT) Caps capsule Commonly known as: DRISDOL Take 1 capsule 2 x /week for Severe Vitamin D Deficiency What changed:  how much to take how to take this when to take this additional instructions       ASK your doctor about  these medications    ciprofloxacin 500 MG tablet Commonly known as: CIPRO Take 1 tablet (500 mg total) by mouth once for 1 dose. Ask about: Should I take this medication?               Discharge Care Instructions  (From admission, onward)           Start     Ordered   08/01/20 0000  Leave dressing on - Keep it clean, dry, and intact until clinic visit        08/01/20 1606            Disposition and follow-up:   Ms.Tanya [name] was discharged from [facility] in Good condition.  At the hospital follow up visit please address:  1.  Follow up     A. Small bowel obstruction - resolved at this time, monitor for recurrence of symptoms     B. HTN - titrate BP meds  as needed  2.  Labs / imaging needed at time of follow-up: blood pressure  3.  Pending labs/ test needing follow-up: none  Follow-up Appointments:  Follow-up Information     Sur[facility], [facility]. Go on 08/11/2020.   Specialty: General Surgery Why: Staple removal scheduled for 10:00 AM. Please arrive 30 min prior to appointment time. Bring photo ID and insurance information.  Contact information: [address] [place] [address] [phone]         Berna Bue, MD. Go on 08/21/2020.   Specialty: General Surgery Why: Follow up appointment scheduled for 11:20 AM. Please arrive 15 min prior to appointment time. Bring photo ID and insurance information.  Contact information: [address] [place] [address] [phone]                 Hospital Course by problem list:  1. Small bowel obstruction s/p ex lap with small bowel resection Presents with 5 days of abd pain, distension, n/v, worsening of Sx with PO intake. Last BM 9/15. Surgical hx of appendectomy and abdominal hysterectomy. CT with evidence of SBO with transition point in RLQ near cecum. Attempted conservative management for 4 days without resolution of symptoms. Underwent ex-lap with lysis of adhesions and resection of 20cm of small bowel with anastomosis on 9/26. Patient slowly regained bowel function over the next few days. Her NG tube was removed. At discharge patient was advanced to a full diet. She had yet to have a bowel movement, but was passing large amounts of flatus. She was prescribed miralax to assist with her return to bowel function.   2. Essential Hypertension Patient found to be hypertensive during admission, however, she was asymptomatic. The patient had not received her losartan during her admission, she was resumed on her home blood pressure medications. Ensure this is well-controlled in follow up.  3. Diabetes Mellitus Patient's blood sugars  ranged from 103-134's during hospital admission. She was started on sliding scale insulin during her admission and discharged home on her metformin.   4. Hyperlipidemia Patient was continued on her home olmasartan 40 mg at discharge.   Discharge Vitals:   BP (!) 186/78   Pulse (!) 56   Temp 98.9 F (37.2 C) (Oral)   Resp 19   Ht 5\' 5"  (1.651 m)   Wt 87.1 kg   SpO2 99%   BMI 31.95 kg/m   Pertinent Labs, Studies, and Procedures:   WBC 4.0 - 10.5 K/uL 7.4  6.6  6.3  9.3  9.5   RBC 3.87 - 5.11 MIL/uL 4.40  4.16  4.18  4.01  4.82   Hemoglobin 12.0 - 15.0 g/dL 11.9 Low   14.7 Low   82.9 Low   10.4 Low   12.4   HCT 36 - 46 % 34.6 Low   33.1 Low   34.0 Low   32.2 Low   38.6   MCV 80.0 - 100.0 fL 78.6 Low   79.6 Low   81.3  80.3  80.1   MCH 26.0 - 34.0 pg 25.5 Low   25.2 Low   25.4 Low   25.9 Low   25.7 Low    MCHC 30.0 - 36.0 g/dL 56.2  13.0  86.5  78.4  32.1   RDW 11.5 - 15.5 % 14.9  15.3  15.3  15.0  14.8   Platelets 150 - 400 K/uL 545 High   481 High   392  381  398   nRBC 0.0 - 0.2 % 0.0  0.0 CM  0.0 CM  0.0 CM  0.0 CM    Sodium 135 - 145 mmol/L 138  138  137  137  137   Potassium 3.5 - 5.1 mmol/L 3.6  4.1 CM  3.3 Low   3.5  3.6   Chloride 98 - 111 mmol/L 101  105  102  103  101   CO2 22 - 32 mmol/L Glucose, Bld 70 - 99 mg/dL 696 High   295 High  CM  127 High  CM  125 High  CM  178 High  CM   Comment: Glucose reference range applies only to samples taken after fasting for at least 8 hours.  BUN 6 - 20 mg/dL <5 Low   <5 Low   7  6  <5 Low    Creatinine, Ser 0.44 - 1.00 mg/dL 2.84  1.32 Low   4.40  0.49  0.50   Calcium 8.9 - 10.3 mg/dL 8.4 Low   8.2 Low   7.9 Low   8.0 Low   8.0 Low    GFR calc non Af Amer >60 mL/min >60  >60  >60  >60  >60   GFR calc Af Amer >60 mL/min >60  >60  >60  >60  >60   Anion gap 5 - CM  11 CM  8 CM  8 CM    CT ABDOMEN PELVIS W CONTRAST  Result Date: 07/23/2020 CLINICAL DATA:  Diffuse abdominal pain for 3 days. History of  appendectomy and hysterectomy EXAM: CT ABDOMEN AND PELVIS WITH CONTRAST TECHNIQUE: Multidetector CT imaging of the abdomen and pelvis was performed using the standard protocol following bolus administration of intravenous contrast. CONTRAST:  OMNIPAQUE IOHEXOL 300 MG/ML  SOLN COMPARISON:  Abdominal x-ray 07/22/2020 FINDINGS: Lower chest: Layering small right-sided pleural effusion. Left lung bases clear. Heart size is within normal limits. Hepatobiliary: No focal liver abnormality is seen. No gallstones, gallbladder wall thickening, or biliary dilatation. Pancreas: Unremarkable. No pancreatic ductal dilatation or surrounding inflammatory changes. Spleen: Normal in size without focal abnormality. Adrenals/Urinary Tract: Unremarkable adrenal glands. 4 mm nonobstructing stone within the lower pole of the left kidney. 1.8 cm right renal sinus cyst. No hydronephrosis. Urinary bladder is largely decompressed but appears grossly unremarkable. Stomach/Bowel: Numerous dilated loops of small bowel throughout the abdomen measuring up to 3.5 cm in diameter. Small bowel is  largely fluid-filled. The stomach is also dilated and fluid-filled. There is transition point to decompressed small bowel within the right lower quadrant near the base of the cecum (series 7, images 41-44). There is a small volume of stool within the colon. The colon is otherwise decompressed. Vascular/Lymphatic: No significant vascular findings are present. No enlarged abdominal or pelvic lymph nodes. Reproductive: Status post hysterectomy. No adnexal masses. Other: Trace volume of free fluid within the right lower quadrant. No abdominopelvic fluid collection. No pneumoperitoneum. Tiny fat containing umbilical hernia. Musculoskeletal: No acute or significant osseous findings. IMPRESSION: 1. Small-bowel obstruction with transition point within the right lower quadrant near the base of the cecum, presumably secondary to adhesions. 2. Trace free fluid  within the right lower quadrant, likely reactive. No organized fluid collection or pneumoperitoneum. 3. Small right-sided pleural effusion. 4. Nonobstructing 4 mm left renal stone. These results were called by telephone at the time of interpretation on 07/23/2020 at 2:20 pm to provider Pricilla Loveless , who verbally acknowledged these results. Electronically Signed   By: Duanne Guess D.O.   On: 07/23/2020 14:20   DG Abd Portable 1V-Small Bowel Obstruction Protocol-initial, 8 hr delay  Result Date: 07/24/2020 CLINICAL DATA:  Small-bowel obstruction EXAM: PORTABLE ABDOMEN - 1 VIEW COMPARISON:  07/23/2020 FINDINGS: The enteric tube projects over the stomach. If the patient was administered oral contrast it appears that the majority of the contrast by the resides within the stomach or small bowel. There is no clear oral contrast within the colon. IMPRESSION: The enteric tube projects over the stomach. No oral contrast visualized in the colon. Electronically Signed   By: Katherine Mantle M.D.   On: 07/24/2020 19:08   DG Abd Portable 1V-Small Bowel Protocol-Position Verification  Result Date: 07/23/2020 CLINICAL DATA:  NG tube placement EXAM: PORTABLE ABDOMEN - 1 VIEW COMPARISON:  07/22/2020 FINDINGS: The enteric tube tip projects over the gastric body. Again noted are dilated loops of small bowel consistent with the previously described small bowel obstruction. Excreted contrast is noted within the collecting system. IMPRESSION: 1. Enteric tube projects over the gastric body. 2. Persistent small bowel obstruction. Electronically Signed   By: Katherine Mantle M.D.   On: 07/23/2020 17:29     DATE OF PROCEDURE: 07/27/2020 PROCEDURE PERFORMED:  Exploratory laparotomy Lysis of adhesions Small bowel resection SURGEONS: Phylliss Blakes, MD Louisa Second, MD (resident) ANESTHESIA: General INDICATIONS: The patient is a 59 y.o. Female with prior hysterectomy that presented with small bowel obstruction  that failed non-operative management.  PROCEDURE IN DETAIL:  Following informed consent, the patient was taken to the operating room and was placed in the supine position. Anesthesia was induced and the abdomen was prepped and draped in the usual standard fashion. An operative time out including all members of the operating room staff was conducted.  A midline incision was made and carried down to the fascia with electrocautery.  The fascia was entered sharply. Upon entering the abdomen we encountered dilated small bowel. The small bowel was ran from ligament of Treitz to TI. There were dense adhesions of small bowel pelvis which were taken down with a combination of sharp and blunt dissection. This adhesions were approximately 40 cm from TI. There were two areas in the small bowel which had a stricture, for this reason we elected to resect this segment. The small bowel was transected at this level using the GIA 70. The mesentery was then divided with the Ligasure. We then performed a ileal-ileal anastomosis by opening up  the antimesenteric border near the GIA staple lines and placed a GIA 70 with a blue load into each limb to be anastomosed.  We brought the antimesenteric surfaces into apposition and fired the stapler.  We examined the inside of the staple line and found it to be hemostatic.  We staggered the staple lines and closed the common enterotomy with a single fire of a TA 60 stapler with a blue load.  After this, we placed 3-0 silk interrupted sutures in the crotch of the anastomosis. The mesentery defect was closed with interrupted 3-0 silk sutures. The abdomen was irrigated with 2L of warm normal saline. NGT placement was confirmed. The fascia was closed with #1 loop maxon. Skin was closed with staples. The incision was covered with Opsite dressing. All counts were correct at the end of the procedure. Patient was extubated it and taken to PACU in stable condition.  ESTIMATED BLOOD LOSS: 20  cc SPECIMENS: Small bowel DRAINS: None COMPLICATIONS: None  Discharge Instructions: Discharge Instructions     Call MD for:  difficulty breathing, headache or visual disturbances   Complete by: As directed    Call MD for:  extreme fatigue   Complete by: As directed    Call MD for:  persistant dizziness or light-headedness   Complete by: As directed    Call MD for:  persistant nausea and vomiting   Complete by: As directed    Call MD for:  redness, tenderness, or signs of infection (pain, swelling, redness, odor or green/yellow discharge around incision site)   Complete by: As directed    Call MD for:  severe uncontrolled pain   Complete by: As directed    Call MD for:  temperature >100.4   Complete by: As directed    Diet - low sodium heart healthy   Complete by: As directed    Increase activity slowly   Complete by: As directed    Leave dressing on - Keep it clean, dry, and intact until clinic visit   Complete by: As directed       Signed: Belva Agee, MD 08/04/2020, 6:11 PM

## 2020-07-29 NOTE — Progress Notes (Signed)
[facility] Progress Note  2 Days Post-Op  Subjective: CC-  Patient doing well this morning. Very mild abdominal pain that is alleviated with Tylenol. She walked up and down the hallway two times yesterday evening. She is continuing to use her IS pulling 1200. Foley removed yesterday and making good urine. NG tube removed yesterday and still on ice chip diet. Denies N/V and is would like to eat a little more. Denies flatus and BM.  ROS: See above, otherwise other systems negative   Objective: Vital signs in last 24 hours: Temp:  [97.5 F (36.4 C)-98.6 F (37 C)] 98.6 F (37 C) (09/28 0408) Pulse Rate:  [89-98] 98 (09/28 0408) Resp:  [17-20] 17 (09/28 0408) BP: (160-183)/(80-96) 160/80 (09/28 0408) SpO2:  [96 %-97 %] 96 % (09/28 0408) Last BM Date: 07/27/20  Intake/Output from previous day: 09/27 0701 - 09/28 0700 In: -  Out: 110 [Emesis/NG output:110] Intake/Output this shift: No intake/output data recorded.  PE: Gen:  Alert, NAD, pleasant HEENT: EOM's intact, pupils equal and round Card:  RRR, no M/G/R heard, 2+ DP pulses Pulm:  CTAB, no W/R/R, rate and effort normal Abd: Soft, mildly tender upon palpation, slightly distended, +BS, Honeycomb dressing in place with small amt dried sanguinous drainage.  Psych: A&Ox4  Skin: no rashes noted, warm and dry  Lab Results:  Recent Labs    07/28/20 0145 07/29/20 0326  WBC 9.5 9.3  HGB 12.4 10.4*  HCT 38.6 32.2*  PLT 398 381   BMET Recent Labs    07/28/20 0145 07/29/20 0326  NA 137 137  K 3.6 3.5  CL 101 103  CO2 28 26  GLUCOSE 178* 125*  BUN <5* 6  CREATININE 0.50 0.49  CALCIUM 8.0* 8.0*   PT/INR No results for input(s): LABPROT, INR in the last 72 hours. CMP     Component Value Date/Time   NA 137 07/29/2020 0326   K 3.5 07/29/2020 0326   CL 103 07/29/2020 0326   CO2 26 07/29/2020 0326   GLUCOSE 125 (H) 07/29/2020 0326   BUN 6 07/29/2020 0326   CREATININE 0.49 07/29/2020 0326   CREATININE  0.66 04/30/2020 0913   CALCIUM 8.0 (L) 07/29/2020 0326   PROT 6.3 (L) 07/24/2020 0309   ALBUMIN 3.4 (L) 07/24/2020 0309   AST 18 07/24/2020 0309   ALT 9 07/24/2020 0309   ALKPHOS 35 (L) 07/24/2020 0309   BILITOT 1.2 07/24/2020 0309   GFRNONAA >60 07/29/2020 0326   GFRNONAA 96 04/30/2020 0913   GFRAA >60 07/29/2020 0326   GFRAA 111 04/30/2020 0913   Lipase     Component Value Date/Time   LIPASE 19 07/23/2020 0842       Studies/Results: No results found.  Anti-infectives: Anti-infectives (From admission, onward)   Start     Dose/Rate Route Frequency Ordered Stop   07/27/20 0815  ceFAZolin (ANCEF) IVPB 2g/100 mL premix        2 g 200 mL/hr over 30 Minutes Intravenous On call to O.R. 07/27/20 5427 07/28/20 0559       Assessment/Plan HTN HLD T2DM Hx of Sarcoidosis Obesity - BMI 31.95  Hx of open appendectomy and abdominal hysterectomy  SBO S/P exploratory laparotomy, lysis of adhesions, 20cm small bowel resection 07/27/20 Dr. Fredricka Bonine  - POD#2, AFVSS, WBC 9, H/H 10/32 - await return of bowel function - NG and foley removed yesterday - OOB/mobilize, IS 10x q 1h  CWC:BJSEGBTD ice chips and small sips of water; goal K > 4.0  VTE: Resume Lovenox today ID: [id number] Ancef 9/26 Foley: removed yesterday; patient making urine Dispo: Med-Surg, inpatient     LOS: 6 days    Pia Mau, PA-S [facility] 07/29/2020, 7:49 AM Please see Amion for pager number during day hours 7:00am-4:30pm

## 2020-07-29 NOTE — Plan of Care (Signed)
  Problem: Education: Goal: Knowledge of General Education information will improve Description: Including pain rating scale, medication(s)/side effects and non-pharmacologic comfort measures Outcome: Progressing   Problem: Health Behavior/Discharge Planning: Goal: Ability to manage health-related needs will improve Outcome: Progressing   Problem: Clinical Measurements: Goal: Ability to maintain clinical measurements within normal limits will improve Outcome: Progressing Goal: Will remain free from infection Outcome: Progressing Goal: Diagnostic test results will improve Outcome: Progressing Goal: Respiratory complications will improve Outcome: Progressing Goal: Cardiovascular complication will be avoided Outcome: Progressing   Problem: Activity: Goal: Risk for activity intolerance will decrease Outcome: Progressing   Problem: Coping: Goal: Level of anxiety will decrease Outcome: Progressing   Problem: Elimination: Goal: Will not experience complications related to urinary retention Outcome: Progressing   Problem: Pain Managment: Goal: General experience of comfort will improve Outcome: Progressing   Problem: Safety: Goal: Ability to remain free from injury will improve Outcome: Progressing   

## 2020-07-30 DIAGNOSIS — Z9889 Other specified postprocedural states: Secondary | ICD-10-CM

## 2020-07-30 LAB — BASIC METABOLIC PANEL
Anion gap: 11 (ref 5–15)
BUN: 7 mg/dL (ref 6–20)
CO2: 24 mmol/L (ref 22–32)
Calcium: 7.9 mg/dL — ABNORMAL LOW (ref 8.9–10.3)
Chloride: 102 mmol/L (ref 98–111)
Creatinine, Ser: 0.44 mg/dL (ref 0.44–1.00)
GFR calc Af Amer: >60 mL/min (ref >60)
GFR calc non Af Amer: >60 mL/min (ref >60)
Glucose, Bld: 127 mg/dL — ABNORMAL HIGH (ref 70–99)
Potassium: 3.3 mmol/L — ABNORMAL LOW (ref 3.5–5.1)
Sodium: 137 mmol/L (ref 135–145)

## 2020-07-30 LAB — GLUCOSE, CAPILLARY
Glucose-Capillary: 108 mg/dL — ABNORMAL HIGH (ref 70–99)
Glucose-Capillary: 110 mg/dL — ABNORMAL HIGH (ref 70–99)
Glucose-Capillary: 111 mg/dL — ABNORMAL HIGH (ref 70–99)
Glucose-Capillary: 123 mg/dL — ABNORMAL HIGH (ref 70–99)
Glucose-Capillary: 127 mg/dL — ABNORMAL HIGH (ref 70–99)
Glucose-Capillary: 128 mg/dL — ABNORMAL HIGH (ref 70–99)
Glucose-Capillary: 131 mg/dL — ABNORMAL HIGH (ref 70–99)
Glucose-Capillary: 93 mg/dL (ref 70–99)

## 2020-07-30 LAB — CBC
HCT: 34 % — ABNORMAL LOW (ref 36.0–46.0)
Hemoglobin: 10.6 g/dL — ABNORMAL LOW (ref 12.0–15.0)
MCH: 25.4 pg — ABNORMAL LOW (ref 26.0–34.0)
MCHC: 31.2 g/dL (ref 30.0–36.0)
MCV: 81.3 fL (ref 80.0–100.0)
Platelets: 392 10*3/uL (ref 150–400)
RBC: 4.18 MIL/uL (ref 3.87–5.11)
RDW: 15.3 % (ref 11.5–15.5)
WBC: 6.3 10*3/uL (ref 4.0–10.5)
nRBC: 0 % (ref 0.0–0.2)

## 2020-07-30 MED ORDER — GABAPENTIN 100 MG PO CAPS
100.0000 mg | ORAL_CAPSULE | Freq: Three times a day (TID) | ORAL | Status: DC | PRN
  Administered 2020-07-30: 100 mg via ORAL
  Filled 2020-07-30: qty 1

## 2020-07-30 MED ORDER — BISOPROLOL-HYDROCHLOROTHIAZIDE 10-6.25 MG PO TABS
1.0000 | ORAL_TABLET | Freq: Every day | ORAL | Status: DC
  Administered 2020-07-30 – 2020-08-01 (×3): 1 via ORAL
  Filled 2020-07-30 (×3): qty 1

## 2020-07-30 MED ORDER — POTASSIUM CHLORIDE 20 MEQ PO PACK
40.0000 meq | PACK | Freq: Two times a day (BID) | ORAL | Status: AC
  Administered 2020-07-30 (×2): 40 meq via ORAL
  Filled 2020-07-30 (×2): qty 2

## 2020-07-30 MED ORDER — ASPIRIN EC 81 MG PO TBEC
81.0000 mg | DELAYED_RELEASE_TABLET | Freq: Every day | ORAL | Status: DC
  Administered 2020-07-30 – 2020-08-01 (×3): 81 mg via ORAL
  Filled 2020-07-30 (×3): qty 1

## 2020-07-30 NOTE — Progress Notes (Addendum)
   Subjective:   Tanya [name] feels well this morning, is excited to start clear liquid diet today per surgery. She states that her pain is controlled with tylenol and occasional ibuprofen. She has been walking frequently in the halls. No gas or BM yet. Endorses mild pressure at the end of urination, but no frank dysuria or frequency. Discussed with her that with negative UA and recent 5d of cipro, we would not treat further at this time. Denies CP, SOB.  Objective:  Vital signs in last 24 hours: Vitals:   07/29/20 0408 07/29/20 1510 07/29/20 2040 07/30/20 0432  BP: (!) 160/80 (!) 176/84 (!) 162/86 (!) 176/97  Pulse: 98 91 100 91  Resp: 17 18 17 16   Temp: 98.6 F (37 C) 98.8 F (37.1 C) 98.8 F (37.1 C) 98.3 F (36.8 C)  TempSrc: Oral Oral Oral Oral  SpO2: 96% 97% 98% 98%  Weight:      Height:       Constitutional: no acute distress  Head: atraumatic ENT: external ears normal, NG tube removed Eyes: EOMI Cardiovascular: regular rate and rhythm, normal heart sounds, no LE edema Pulmonary: effort normal, lungs clear to ascultation bilaterally Abdominal: flat, surgical wound covered with dressing, very mild tenderness, few bowel sounds present Skin: warm and dry Neurological: alert, no focal deficit Psychiatric: normal mood and affect  Assessment/Plan: Tanya [name] is a 59 y.o. female with hx of sarcoidosis, HTN, HLD, DM, OSA presenting for abdominal pain and distention, imaging and exam consistent with small bowel obstruction. S/p ex lap with removal of 20cm of small bowel. Pending return of bowel function.  Principal Problem:   Small bowel obstruction (HCC) Active Problems:   Hypertension   Hyperlipidemia associated with type 2 diabetes mellitus (HCC)   Type 2 diabetes mellitus with obesity (HCC)   Obesity (BMI 30.0-34.9)  Small bowel obstruction s/p ex lap with small bowel resection POD 3. Surgical hx of appendectomy and abdominal hysterectomy. CT with evidence of SBO  with transition point in RLQ near cecum. NG tube removed. Pending return of bowel function. -surgery following, appreciate assistance -clear liquids -reduce IV fluids if good PO intake -c/w Zofran prn -c/w IV PPI -pain control with tylenol, ibuprofen, and dilaudid q2hr -K 3.3 today, repleting with goal >4.0  Essential HTN Hypertensive but asymptomatic.  -continue clonidine patch 0.3mg   -start home Ziac  DM Controlled with metformin only at home. Last A1c 6.2 on 04/30/20. BG ~160-170s today. No hypoglycemic event -continue SSI  HLD On olmasartan 40mg  at home.  -resume at discharge      Diet:  Clear liquids IVF:  D5 NS VTE:  lovenox Prior to Admission Living Arrangement:  home Anticipated Discharge Location:  home Barriers to Discharge:  Recovery from small bowel resection Dispo: Anticipated discharge in 2-3 days  05/02/20, MD 07/30/2020, 1:03 PM Pager: [phone] After 5pm on weekdays and 1pm on weekends: On Call pager [phone]

## 2020-07-30 NOTE — Plan of Care (Signed)
  Problem: Education: Goal: Knowledge of General Education information will improve Description: Including pain rating scale, medication(s)/side effects and non-pharmacologic comfort measures Outcome: Progressing   Problem: Nutrition: Goal: Adequate nutrition will be maintained Outcome: Progressing  Patient tolerating small amounts of clear liquids, requiring no PRN antiemetics this shift.  Problem: Activity: Goal: Risk for activity intolerance will decrease Outcome: Progressing  Patient up in hall several time stoday.

## 2020-07-30 NOTE — Progress Notes (Signed)
[facility] Progress Note  3 Days Post-Op  Subjective: CC-  Patient doing well this morning. Mild abdominal tenderness that is alleviated with Tylenol. She got up to walk yesterday a few times. Still using IS.  Patient making urine without complications. Patient is still on ice chip diet. Denies N/V. Denies flatus and BM.   ROS: See above, otherwise other systems negative   Objective: Vital signs in last 24 hours: Temp:  [98.3 F (36.8 C)-98.8 F (37.1 C)] 98.3 F (36.8 C) (09/29 0432) Pulse Rate:  [91-100] 91 (09/29 0432) Resp:  [16-18] 16 (09/29 0432) BP: (162-176)/(84-97) 176/97 (09/29 0432) SpO2:  [97 %-98 %] 98 % (09/29 0432) Last BM Date: 07/27/20  Intake/Output from previous day: 09/28 0701 - 09/29 0700 In: 110 [P.O.:60; IV Piggyback:50] Out: -  Intake/Output this shift: No intake/output data recorded.  PE: Gen:  Alert, NAD, pleasant HEENT: EOM's intact, pupils equal and round Card:  RRR, no M/G/R heard, 2+ DP pulses bilaterally Pulm:  CTAB, no W/R/R, rate and effort normal Abd: Soft, mildly tender to palpation of bilateral lower quadrant; some abdominal distention, +BS, Honeycomb dressing in place with small amt dried sanguinous drainage. Psych: A&Ox4  Skin: no rashes noted, warm and dry  Lab Results:  Recent Labs    07/28/20 0145 07/29/20 0326  WBC 9.5 9.3  HGB 12.4 10.4*  HCT 38.6 32.2*  PLT 398 381   BMET Recent Labs    07/28/20 0145 07/29/20 0326  NA 137 137  K 3.6 3.5  CL 101 103  CO2 28 26  GLUCOSE 178* 125*  BUN <5* 6  CREATININE 0.50 0.49  CALCIUM 8.0* 8.0*   PT/INR No results for input(s): LABPROT, INR in the last 72 hours. CMP     Component Value Date/Time   NA 137 07/29/2020 0326   K 3.5 07/29/2020 0326   CL 103 07/29/2020 0326   CO2 26 07/29/2020 0326   GLUCOSE 125 (H) 07/29/2020 0326   BUN 6 07/29/2020 0326   CREATININE 0.49 07/29/2020 0326   CREATININE 0.66 04/30/2020 0913   CALCIUM 8.0 (L) 07/29/2020 0326    PROT 6.3 (L) 07/24/2020 0309   ALBUMIN 3.4 (L) 07/24/2020 0309   AST 18 07/24/2020 0309   ALT 9 07/24/2020 0309   ALKPHOS 35 (L) 07/24/2020 0309   BILITOT 1.2 07/24/2020 0309   GFRNONAA >60 07/29/2020 0326   GFRNONAA 96 04/30/2020 0913   GFRAA >60 07/29/2020 0326   GFRAA 111 04/30/2020 0913   Lipase     Component Value Date/Time   LIPASE 19 07/23/2020 0842       Studies/Results: No results found.  Anti-infectives: Anti-infectives (From admission, onward)   Start     Dose/Rate Route Frequency Ordered Stop   07/27/20 0815  ceFAZolin (ANCEF) IVPB 2g/100 mL premix        2 g 200 mL/hr over 30 Minutes Intravenous On call to O.R. 07/27/20 9622 07/28/20 0559       Assessment/Plan HTN HLD T2DM Hx of Sarcoidosis Obesity - BMI 31.95  Hx of open appendectomy and abdominal hysterectomy  SBO S/P exploratory laparotomy, lysis of adhesions, 20cm small bowel resection 07/27/20 Dr. Fredricka Bonine  - POD#3, AFVSS - await return of bowel function - OOB/mobilize, IS 10x q 1h  WLN:LGXQ order clear liquid diet to begin slowly (Will discontinue if experiencing N/V or worsening abdominal distention); goal K >4.0 VTE: Lovenox ID: 2g Ancef9/26 Foley:None Dispo: Med-Surg, inpatient   LOS: 7 days  Pia Mau, PA-S [facility] Surgery 07/30/2020, 7:45 AM Please see Amion for pager number during day hours 7:00am-4:30pm

## 2020-07-31 ENCOUNTER — Encounter: Payer: BC Managed Care – PPO | Admitting: Adult Health

## 2020-07-31 DIAGNOSIS — E669 Obesity, unspecified: Secondary | ICD-10-CM

## 2020-07-31 LAB — GLUCOSE, CAPILLARY
Glucose-Capillary: 112 mg/dL — ABNORMAL HIGH (ref 70–99)
Glucose-Capillary: 118 mg/dL — ABNORMAL HIGH (ref 70–99)
Glucose-Capillary: 123 mg/dL — ABNORMAL HIGH (ref 70–99)
Glucose-Capillary: 97 mg/dL (ref 70–99)
Glucose-Capillary: 128 mg/dL — ABNORMAL HIGH (ref 70–99)

## 2020-07-31 LAB — CBC
HCT: 33.1 % — ABNORMAL LOW (ref 36.0–46.0)
Hemoglobin: 10.5 g/dL — ABNORMAL LOW (ref 12.0–15.0)
MCH: 25.2 pg — ABNORMAL LOW (ref 26.0–34.0)
MCHC: 31.7 g/dL (ref 30.0–36.0)
MCV: 79.6 fL — ABNORMAL LOW (ref 80.0–100.0)
Platelets: 481 10*3/uL — ABNORMAL HIGH (ref 150–400)
RBC: 4.16 MIL/uL (ref 3.87–5.11)
RDW: 15.3 % (ref 11.5–15.5)
WBC: 6.6 10*3/uL (ref 4.0–10.5)
nRBC: 0 % (ref 0.0–0.2)

## 2020-07-31 LAB — BASIC METABOLIC PANEL
Anion gap: 9 (ref 5–15)
BUN: <5 mg/dL — ABNORMAL LOW (ref 6–20)
CO2: 24 mmol/L (ref 22–32)
Calcium: 8.2 mg/dL — ABNORMAL LOW (ref 8.9–10.3)
Chloride: 105 mmol/L (ref 98–111)
Creatinine, Ser: 0.43 mg/dL — ABNORMAL LOW (ref 0.44–1.00)
GFR calc Af Amer: >60 mL/min (ref >60)
GFR calc non Af Amer: >60 mL/min (ref >60)
Glucose, Bld: 103 mg/dL — ABNORMAL HIGH (ref 70–99)
Potassium: 4.1 mmol/L (ref 3.5–5.1)
Sodium: 138 mmol/L (ref 135–145)

## 2020-07-31 NOTE — Progress Notes (Addendum)
   Subjective:   Patient feels well this morning, tolerated clear liquid diet yesterday without difficulty. Reports she has passed gas 3 times. No BM yet. Comfortable with advancing to full liquids per surgery. Denies SOB, abd pain is controlled.  Objective:  Vital signs in last 24 hours: Vitals:   07/30/20 0432 07/30/20 1514 07/30/20 2140 07/31/20 0406  BP: (!) 176/97 (!) 172/85 (!) 182/97 (!) 163/103  Pulse: 91 85 72 68  Resp: 16 17 17 17   Temp: 98.3 F (36.8 C) 98.9 F (37.2 C) 98.8 F (37.1 C) 97.6 F (36.4 C)  TempSrc: Oral Oral Oral Oral  SpO2: 98% 98% 96% 97%  Weight:      Height:       Constitutional: no acute distress  Head: atraumatic ENT: external ears normal Eyes: EOMI Cardiovascular: regular rate and rhythm, normal heart sounds, no LE edema Pulmonary: effort normal, lungs clear to ascultation bilaterally Abdominal: flat, surgical wound covered with dressing, no purulence, bowel sounds decreased Skin: warm and dry Neurological: alert, no focal deficit Psychiatric: normal mood and affect  Assessment/Plan: Tanya [name] is a 59 y.o. female with hx of sarcoidosis, HTN, HLD, DM, OSA presenting for abdominal pain and distention, imaging and exam consistent with small bowel obstruction. S/p ex lap with removal of 20cm of small bowel. Passing gas, no BM yet.  Principal Problem:   Small bowel obstruction (HCC) Active Problems:   Hypertension   Hyperlipidemia associated with type 2 diabetes mellitus (HCC)   Type 2 diabetes mellitus with obesity (HCC)   Obesity (BMI 30.0-34.9)  Small bowel obstruction s/p ex lap with small bowel resection POD 3. Surgical hx of appendectomy and abdominal hysterectomy. CT with evidence of SBO with transition point in RLQ near cecum. NG tube removed. Pending return of bowel function. -surgery following, appreciate assistance -clear liquids -reduce IV fluids if good PO intake -c/w Zofran prn -c/w IV PPI -pain control with tylenol,  ibuprofen, and dilaudid q2hr -K 4.1 today, goal >4.0  Essential HTN Hypertensive but asymptomatic.  -continue clonidine patch 0.3mg   -start home Ziac  DM Controlled with metformin only at home. Last A1c 6.2 on 04/30/20. BG ~160-170s today. No hypoglycemic event -continue SSI  HLD On olmasartan 40mg  at home.  -resume at discharge      Diet:  Full liquids IVF:  D5 NS VTE:  lovenox Prior to Admission Living Arrangement:  home Anticipated Discharge Location:  home Barriers to Discharge:  Pending return of bowel function Dispo: Anticipated discharge in 1-2 days  05/02/20, MD 07/31/2020, 6:51 AM Pager: [phone] After 5pm on weekdays and 1pm on weekends: On Call pager [phone]

## 2020-07-31 NOTE — Progress Notes (Addendum)
[facility] Progress Note  4 Days Post-Op  Subjective: CC-  Patient doing well this morning sitting in chair. Diffuse mild abdominal tenderness. She got up to walk yesterday a few times. Still using IS pulling 1200.  Patient making urine without complications. Patient started clear liquid diet yesterday and doing well. Denies N/V. Denies flatus and BM.   ROS: See above, otherwise other systems negative   Objective: Vital signs in last 24 hours: Temp:  [97.6 F (36.4 C)-98.9 F (37.2 C)] 97.6 F (36.4 C) (09/30 0406) Pulse Rate:  [68-85] 68 (09/30 0406) Resp:  [17] 17 (09/30 0406) BP: (163-182)/(85-103) 163/103 (09/30 0406) SpO2:  [96 %-98 %] 97 % (09/30 0406) Last BM Date: 07/27/20  Intake/Output from previous day: 09/29 0701 - 09/30 0700 In: 1091.6 [P.O.:540; I.V.:551.6] Out: -  Intake/Output this shift: No intake/output data recorded.  PE: Gen:  Alert, NAD, pleasant HEENT: EOM's intact, pupils equal and round Card:  RRR, no M/G/R heard, 2+ DP pulses BL Pulm:  CTAB, no W/R/R, rate and effort normal Abd: Soft, mildly tender and distended, +BS, Honeycomb dressing in place with small dried sanguinous drainage Psych: A&Ox4  Skin: no rashes noted, warm and dry  Lab Results:  Recent Labs    07/30/20 1209 07/31/20 0252  WBC 6.3 6.6  HGB 10.6* 10.5*  HCT 34.0* 33.1*  PLT 392 481*   BMET Recent Labs    07/30/20 1209 07/31/20 0252  NA 137 138  K 3.3* 4.1  CL 102 105  CO2 24 24  GLUCOSE 127* 103*  BUN 7 <5*  CREATININE 0.44 0.43*  CALCIUM 7.9* 8.2*   PT/INR No results for input(s): LABPROT, INR in the last 72 hours. CMP     Component Value Date/Time   NA 138 07/31/2020 0252   K 4.1 07/31/2020 0252   CL 105 07/31/2020 0252   CO2 24 07/31/2020 0252   GLUCOSE 103 (H) 07/31/2020 0252   BUN <5 (L) 07/31/2020 0252   CREATININE 0.43 (L) 07/31/2020 0252   CREATININE 0.66 04/30/2020 0913   CALCIUM 8.2 (L) 07/31/2020 0252   PROT 6.3 (L) 07/24/2020  0309   ALBUMIN 3.4 (L) 07/24/2020 0309   AST 18 07/24/2020 0309   ALT 9 07/24/2020 0309   ALKPHOS 35 (L) 07/24/2020 0309   BILITOT 1.2 07/24/2020 0309   GFRNONAA >60 07/31/2020 0252   GFRNONAA 96 04/30/2020 0913   GFRAA >60 07/31/2020 0252   GFRAA 111 04/30/2020 0913   Lipase     Component Value Date/Time   LIPASE 19 07/23/2020 0842       Studies/Results: No results found.  Anti-infectives: Anti-infectives (From admission, onward)   Start     Dose/Rate Route Frequency Ordered Stop   07/27/20 0815  ceFAZolin (ANCEF) IVPB 2g/100 mL premix        2 g 200 mL/hr over 30 Minutes Intravenous On call to O.R. 07/27/20 5188 07/28/20 0559       Assessment/Plan HTN HLD T2DM Hx of Sarcoidosis Obesity - BMI 31.95  Hx of open appendectomy and abdominal hysterectomy  SBO S/P exploratory laparotomy, lysis of adhesions, 20cm small bowel resection 07/27/20 Dr. Fredricka Bonine  - POD#4, AFVSS - await return of bowel function - OOB/mobilize, IS 10x q 1h  CZY:[id number] to full diet; advance as tolerated (Will discontinue if experiencing N/V or worsening abdominal distention);goal K >4.0 UXN:ATFTDDU ID: 2g Ancef9/26 Foley:None Dispo: Med-Surg, inpatient   LOS: 8 days    Pia Mau, PA-S [facility] Surgery 07/31/2020,  8:21 AM Please see Amion for pager number during day hours 7:00am-4:30pm

## 2020-08-01 DIAGNOSIS — I493 Ventricular premature depolarization: Secondary | ICD-10-CM

## 2020-08-01 LAB — CBC
HCT: 34.6 % — ABNORMAL LOW (ref 36.0–46.0)
Hemoglobin: 11.2 g/dL — ABNORMAL LOW (ref 12.0–15.0)
MCH: 25.5 pg — ABNORMAL LOW (ref 26.0–34.0)
MCHC: 32.4 g/dL (ref 30.0–36.0)
MCV: 78.6 fL — ABNORMAL LOW (ref 80.0–100.0)
Platelets: 545 10*3/uL — ABNORMAL HIGH (ref 150–400)
RBC: 4.4 MIL/uL (ref 3.87–5.11)
RDW: 14.9 % (ref 11.5–15.5)
WBC: 7.4 10*3/uL (ref 4.0–10.5)
nRBC: 0 % (ref 0.0–0.2)

## 2020-08-01 LAB — BASIC METABOLIC PANEL
Anion gap: 11 (ref 5–15)
BUN: <5 mg/dL — ABNORMAL LOW (ref 6–20)
CO2: 26 mmol/L (ref 22–32)
Calcium: 8.4 mg/dL — ABNORMAL LOW (ref 8.9–10.3)
Chloride: 101 mmol/L (ref 98–111)
Creatinine, Ser: 0.49 mg/dL (ref 0.44–1.00)
GFR calc Af Amer: >60 mL/min (ref >60)
GFR calc non Af Amer: >60 mL/min (ref >60)
Glucose, Bld: 110 mg/dL — ABNORMAL HIGH (ref 70–99)
Potassium: 3.6 mmol/L (ref 3.5–5.1)
Sodium: 138 mmol/L (ref 135–145)

## 2020-08-01 LAB — GLUCOSE, CAPILLARY
Glucose-Capillary: 103 mg/dL — ABNORMAL HIGH (ref 70–99)
Glucose-Capillary: 103 mg/dL — ABNORMAL HIGH (ref 70–99)
Glucose-Capillary: 107 mg/dL — ABNORMAL HIGH (ref 70–99)
Glucose-Capillary: 134 mg/dL — ABNORMAL HIGH (ref 70–99)

## 2020-08-01 MED ORDER — CIPROFLOXACIN HCL 500 MG PO TABS: 500.0000 mg | ORAL_TABLET | Freq: Once | ORAL | 0 refills | Status: AC

## 2020-08-01 MED ORDER — POLYETHYLENE GLYCOL 3350 17 G PO PACK
17.0000 g | PACK | Freq: Every day | ORAL | Status: DC
  Administered 2020-08-01: 17 g via ORAL
  Filled 2020-08-01: qty 1

## 2020-08-01 MED ORDER — POLYETHYLENE GLYCOL 3350 17 G PO PACK: 17.0000 g | PACK | Freq: Every day | ORAL | 0 refills | Status: DC

## 2020-08-01 MED ORDER — PANTOPRAZOLE SODIUM 40 MG PO TBEC: 40.0000 mg | DELAYED_RELEASE_TABLET | Freq: Every day | ORAL | Status: DC

## 2020-08-01 MED ORDER — DOCUSATE SODIUM 100 MG PO CAPS
100.0000 mg | ORAL_CAPSULE | Freq: Two times a day (BID) | ORAL | Status: DC
  Administered 2020-08-01: 100 mg via ORAL
  Filled 2020-08-01: qty 1

## 2020-08-01 MED ORDER — CHLORHEXIDINE GLUCONATE CLOTH 2 % EX PADS
6.0000 | MEDICATED_PAD | Freq: Every day | CUTANEOUS | Status: DC
  Administered 2020-08-01: 6 via TOPICAL

## 2020-08-01 MED ORDER — MUPIROCIN 2 % EX OINT
1.0000 "application " | TOPICAL_OINTMENT | Freq: Two times a day (BID) | CUTANEOUS | Status: DC
  Administered 2020-08-01: 1 via NASAL
  Filled 2020-08-01: qty 22

## 2020-08-01 MED ORDER — DOCUSATE SODIUM 100 MG PO CAPS
100.0000 mg | ORAL_CAPSULE | Freq: Two times a day (BID) | ORAL | 0 refills | Status: DC
Start: 2020-08-01 — End: 2020-08-21

## 2020-08-01 NOTE — Progress Notes (Signed)
[facility] Progress Note  5 Days Post-Op  Subjective: CC-  Patient doing well this morning. Denies any abdominal pain. She is still walking and using IS.  Patient making urine without complications. Patient started full diet as tolerated yesterday and doing well. Denies N/V. She began passing flatus yesterday and today. Still no BM.  ROS: See above, otherwise other systems negative   Objective: Vital signs in last 24 hours: Temp:  [97.8 F (36.6 C)-98.8 F (37.1 C)] 98.8 F (37.1 C) (10/01 0415) Pulse Rate:  [55-76] 76 (10/01 0415) Resp:  [19-20] 19 (10/01 0415) BP: (188-191)/(85-106) 188/96 (10/01 0415) SpO2:  [100 %] 100 % (10/01 0415) Last BM Date: 07/27/20  Intake/Output from previous day: 09/30 0701 - 10/01 0700 In: 469 [I.V.:469] Out: -  Intake/Output this shift: No intake/output data recorded.  PE: Gen:  Alert, NAD, pleasant HEENT: EOM's intact, pupils equal and round Card:  RRR, no M/G/R heard, 2+ DP pulses bilaterally Pulm:  CTAB, no W/R/R, rate and effort normal Abd: Soft, NT/ND, +BS, Honeycomb dressing removed today, Staples present on incision, small amount of dried sanginous drainage, Gauze dressing placed over incision per pt request Psych: A&Ox4 Skin: no rashes noted, warm and dry  Lab Results:  Recent Labs    07/31/20 0252 08/01/20 0351  WBC 6.6 7.4  HGB 10.5* 11.2*  HCT 33.1* 34.6*  PLT 481* 545*   BMET Recent Labs    07/31/20 0252 08/01/20 0351  NA 138 138  K 4.1 3.6  CL 105 101  CO2 24 26  GLUCOSE 103* 110*  BUN <5* <5*  CREATININE 0.43* 0.49  CALCIUM 8.2* 8.4*   PT/INR No results for input(s): LABPROT, INR in the last 72 hours. CMP     Component Value Date/Time   NA 138 08/01/2020 0351   K 3.6 08/01/2020 0351   CL 101 08/01/2020 0351   CO2 26 08/01/2020 0351   GLUCOSE 110 (H) 08/01/2020 0351   BUN <5 (L) 08/01/2020 0351   CREATININE 0.49 08/01/2020 0351   CREATININE 0.66 04/30/2020 0913   CALCIUM 8.4 (L)  08/01/2020 0351   PROT 6.3 (L) 07/24/2020 0309   ALBUMIN 3.4 (L) 07/24/2020 0309   AST 18 07/24/2020 0309   ALT 9 07/24/2020 0309   ALKPHOS 35 (L) 07/24/2020 0309   BILITOT 1.2 07/24/2020 0309   GFRNONAA >60 08/01/2020 0351   GFRNONAA 96 04/30/2020 0913   GFRAA >60 08/01/2020 0351   GFRAA 111 04/30/2020 0913   Lipase     Component Value Date/Time   LIPASE 19 07/23/2020 0842       Studies/Results: No results found.  Anti-infectives: Anti-infectives (From admission, onward)   Start     Dose/Rate Route Frequency Ordered Stop   07/27/20 0815  ceFAZolin (ANCEF) IVPB 2g/100 mL premix        2 g 200 mL/hr over 30 Minutes Intravenous On call to O.R. 07/27/20 1856 07/28/20 0559       Assessment/Plan HTN HLD T2DM Hx of Sarcoidosis Obesity - BMI 31.95  Hx of open appendectomy and abdominal hysterectomy  SBO S/P exploratory laparotomy, lysis of adhesions, 20cm small bowel resection 07/27/20 Dr. Fredricka Bonine  - POD#5, AFVSS - patient has flatus yesterday - OOB/mobilize, IS 10x q 1h - Honeycomb dressing removed: Gauze dressing and tape placed over the incision per patient request.  FEN: Continue full diet; advance as tolerated (Will discontinue if experiencing N/V or worsening abdominal distention);goal K >4.0 DJS:HFWYOVZ ID: 2g Ancef9/26 Foley:None Dispo: Med-Surg,  inpatient   LOS: 9 days    Pia Mau, PA-S [facility] Surgery 08/01/2020, 8:05 AM Please see Amion for pager number during day hours 7:00am-4:30pm

## 2020-08-01 NOTE — Progress Notes (Signed)
Pt ready for discharge.  Completed pt teaching and gave her a copy of discharge instructions.  Pt verbalized and restated information in her own words.  She knows when her follow-up appointments are, restrictions, and where to pick up her meds.

## 2020-08-01 NOTE — Progress Notes (Signed)
Notified Internal Medicine Resident of BP 195/81 with apical pulse 55.  Asked if I should give Ziac.  They stated to hold it until they came. [phone])  Internal Medicine came 1045.  Notified them that her heart also skips a beat.  Not currently on tele. They asked for an EKG.  Arna Medici completed EKG at 1055.

## 2020-08-01 NOTE — Discharge Instructions (Signed)
[facility], [place] [phone]  OPEN ABDOMINAL SURGERY: POST OP INSTRUCTIONS  Always review your discharge instruction sheet given to you by the facility where your surgery was performed.  IF YOU HAVE DISABILITY OR FAMILY LEAVE FORMS, YOU MUST BRING THEM TO THE OFFICE FOR PROCESSING.  PLEASE DO NOT GIVE THEM TO YOUR DOCTOR.  1. A prescription for pain medication may be given to you upon discharge.  Take your pain medication as prescribed, if needed.  If narcotic pain medicine is not needed, then you may take acetaminophen (Tylenol) or ibuprofen (Advil) as needed. 2. Take your usually prescribed medications unless otherwise directed. 3. If you need a refill on your pain medication, please contact your pharmacy. They will contact our office to request authorization.  Prescriptions will not be filled after 5pm or on week-ends. 4. You should follow a light diet the first few days after arrival home, such as soup and crackers, pudding, etc.unless your doctor has advised otherwise. A high-fiber, low fat diet can be resumed as tolerated.   Be sure to include lots of fluids daily. Most patients will experience some swelling and bruising on the chest and neck area.  Ice packs will help.  Swelling and bruising can take several days to resolve 5. Most patients will experience some swelling and bruising in the area of the incision. Ice pack will help. Swelling and bruising can take several days to resolve..  6. It is common to experience some constipation if taking pain medication after surgery.  Increasing fluid intake and taking a stool softener will usually help or prevent this problem from occurring.  A mild laxative (Milk of Magnesia or Miralax) should be taken according to package directions if there are no bowel movements after 48 hours. 7.  You may have steri-strips (small skin tapes) in place directly over the incision.  These strips should be left on the skin for 7-10 days.  If your  surgeon used skin glue on the incision, you may shower in 24 hours.  The glue will flake off over the next 2-3 weeks.  Any sutures or staples will be removed at the office during your follow-up visit. You may find that a light gauze bandage over your incision may keep your staples from being rubbed or pulled. You may shower and replace the bandage daily. 8. ACTIVITIES:  You may resume regular (light) daily activities beginning the next day--such as daily self-care, walking, climbing stairs--gradually increasing activities as tolerated.  You may have sexual intercourse when it is comfortable.  Refrain from any heavy lifting or straining until approved by your doctor. a. You may drive when you no longer are taking prescription pain medication, you can comfortably wear a seatbelt, and you can safely maneuver your car and apply brakes  9. You should see your doctor in the office for a follow-up appointment approximately two weeks after your surgery.  Make sure that you call for this appointment within a day or two after you arrive home to insure a convenient appointment time.   WHEN TO CALL YOUR DOCTOR: 1. Fever over 101.0 2. Inability to urinate 3. Nausea and/or vomiting 4. Extreme swelling or bruising 5. Continued bleeding from incision. 6. Increased pain, redness, or drainage from the incision. 7. Difficulty swallowing or breathing 8. Muscle cramping or spasms. 9. Numbness or tingling in hands or feet or around lips.  The clinic staff is available to answer your questions during regular business hours.  Please don't hesitate to call and ask to speak to one of the nurses if you have concerns.  For further questions, please visit [email or web address]     Bowel Obstruction A bowel obstruction means that something is blocking the small or large bowel. The bowel is also called the intestine. It is the long tube that connects the stomach to the opening of the butt (anus). When something  blocks the bowel, food and fluids cannot pass through like normal. This condition needs to be treated. Treatment depends on the cause of the problem and how bad the problem is. What are the causes? Common causes of this condition include:  Scar tissue (adhesions) from past surgery or from high-energy X-rays (radiation).  Recent surgery in the belly. This affects how food moves in the bowel.  Some diseases, such as: ? Irritation of the lining of the digestive tract (Crohn's disease). ? Irritation of small pouches in the bowel (diverticulitis).  Growths or tumors.  A bulging organ (hernia).  Twisting of the bowel (volvulus).  A foreign body.  Slipping of a part of the bowel into another part (intussusception). What are the signs or symptoms? Symptoms of this condition include:  Pain in the belly.  Feeling sick to your stomach (nauseous).  Throwing up (vomiting).  Bloating in the belly.  Being unable to pass gas.  Trouble pooping (constipation).  Watery poop (diarrhea).  A lot of belching. How is this diagnosed? This condition may be diagnosed based on:  A physical exam.  Medical history.  Imaging tests, such as X-ray or CT scan.  Blood tests.  Urine tests. How is this treated? Treatment for this condition may include:  Fluids and pain medicines that are given through an IV tube. Your doctor may tell you not to eat or drink if you feel sick to your stomach and are throwing up.  Eating a clear liquid diet for a few days.  Putting a small tube (nasogastric tube) into the stomach. This will help with pain, discomfort, and nausea by removing blocked air and fluids from the stomach.  Surgery. This may be needed if other treatments do not work. Follow these instructions at home: Medicines  Take over-the-counter and prescription medicines only as told by your doctor.  If you were prescribed an antibiotic medicine, take it as told by your doctor. Do not stop  taking the antibiotic even if you start to feel better. General instructions  Follow your diet as told by your doctor. You may need to: ? Only drink clear liquids until you start to get better. ? Avoid solid foods.  Return to your normal activities as told by your doctor. Ask your doctor what activities are safe for you.  Do not sit for a long time without moving. Get up to take short walks every 1-2 hours. This is important. Ask for help if you feel weak or unsteady.  Keep all follow-up visits as told by your doctor. This is important. How is this prevented? After having a bowel obstruction, you may be more likely to have another. You can do some things to stop it from happening again.  If you have a long-term (chronic) disease, contact your doctor if you see changes or problems.  Take steps to prevent or treat trouble pooping. Your doctor may ask that you: ? Drink enough fluid to keep your pee (urine) pale yellow. ? Take over-the-counter or prescription medicines. ? Eat foods that are high in fiber. These include beans, whole grains,  and fresh fruits and vegetables. ? Limit foods that are high in fat and sugar. These include fried or sweet foods.  Stay active. Ask your doctor which exercises are safe for you.  Avoid stress.  Eat three small meals and three small snacks each day.  Work with a Psychologist, prison and probation services (dietitian) to make a meal plan that works for you.  Do not use any products that contain nicotine or tobacco, such as cigarettes and e-cigarettes. If you need help quitting, ask your doctor. Contact a doctor if:  You have a fever.  You have chills. Get help right away if:  You have pain or cramps that get worse.  You throw up blood.  You are sick to your stomach.  You cannot stop throwing up.  You cannot drink fluids.  You feel mixed up (confused).  You feel very thirsty (dehydrated).  Your belly gets more bloated.  You feel weak or you pass out  (faint). Summary  A bowel obstruction means that something is blocking the small or large bowel.  Treatment may include IV fluids and pain medicine. You may also have a clear liquid diet, a small tube in your stomach, or surgery.  Drink clear liquids and avoid solid foods until you get better. This information is not intended to replace advice given to you by your health care provider. Make sure you discuss any questions you have with your health care provider. Document Revised: 03/01/2018 Document Reviewed: 03/01/2018 Elsevier Patient Education  2020 [employer].

## 2020-08-01 NOTE — Progress Notes (Signed)
Subjective:   Overnight, patient states she is doing well and ready to be discharged home. She notes that she continues to have an appetite and was tolerating her liquid diet well and ready to be advanced to full diet. She continues to pass gas but has yet to have a bowel movement. She denies any shortness of breath, chest pain, chest palpitations. Her abdominal pain is well controlled at this time. She continues to ambulate the ward halls.   Nursing paged our team and noted that the patient was hypertensive with a blood pressure of 195/81 and pulse of 55. The patient was not symptomatic with any lightheadedness or dizziness, chest pain, or shortness of breath. Nursing noted also hearing a skipped beat on auscultation.   She has no complaints at the time of my examination  Objective:  Vital signs in last 24 hours: Vitals:   08/01/20 0415 08/01/20 0932 08/01/20 1310 08/01/20 1341  BP: (!) 188/96 (!) 195/81 (!) 180/84 (!) 186/78  Pulse: 76 (!) 55 (!) 57 (!) 56  Resp: 19     Temp: 98.8 F (37.1 C)   98.9 F (37.2 C)  TempSrc: Oral   Oral  SpO2: 100%   99%  Weight:      Height:       Physical Exam Vitals and nursing note reviewed.  Constitutional:      General: She is not in acute distress.    Appearance: She is well-developed. She is not ill-appearing, toxic-appearing or diaphoretic.  HENT:     Head: Normocephalic and atraumatic.  Cardiovascular:     Rate and Rhythm: Normal rate.     Heart sounds: No murmur heard.  No friction rub. No gallop.      Comments: Patient noted to have dropped beat on auscultation Pulmonary:     Effort: Pulmonary effort is normal. No respiratory distress.     Breath sounds: Normal breath sounds. No stridor. No wheezing, rhonchi or rales.  Skin:    General: Skin is warm and dry.  Neurological:     General: No focal deficit present.     Mental Status: She is alert and oriented to person, place, and time.    EKG  Sinus rhythm, rate of 60 BPM. P  waves present, with normal PR and QT interval. PVC's present. No ST wave changes appreciated.  Assessment/Plan: Tanya [name] is a 59 y.o. female with hx of sarcoidosis, HTN, HLD, DM, OSA presenting for abdominal pain and distention, imaging and exam consistent with small bowel obstruction. S/p ex lap with removal of 20cm of small bowel. Passing gas, no BM yet. Patient doing well and tolerated liquid diet well. Will advance diet and patient to be discharged home with surgical follow up.   Principal Problem:   Small bowel obstruction (HCC) Active Problems:   Hypertension   Hyperlipidemia associated with type 2 diabetes mellitus (HCC)   Type 2 diabetes mellitus with obesity (HCC)   Obesity (BMI 30.0-34.9)  Small bowel obstruction s/p ex lap with small bowel resection POD 5. Surgical hx of appendectomy and abdominal hysterectomy. CT with evidence of SBO with transition point in RLQ near cecum. NG tube removed. Patient passing flatus with some abdominal discomfort. She has yet to have a bowel movement. Surgery feel comfortable with patient being discharged home with outpatient follow up.   - Patient discharged home with surgical follow up -clear liquids tolerate, advanced to full diet -pain controlled and patient to be discharged home on stool softeners  Essential HTN Hypertensive but asymptomatic. Denied dizziness,lightheadedness, chest pain, shortness of breath, palpitations. Continued patient back on home blood pressure medications. She had not received her losartan during her admission and it was added back on prior to her being discharged.   continue home blood pressure medication regimen of ziac 10-6.25, clonidine .2 mg tab BID, hydralazine 25 mg TID, benicar 40 mg tab daily  Premature Ventricular Contractions PVC's present on EKG performed today due to nurse noting patient having dropped beats upon her auscultation. Appreciate this as well when I performed my physical exam. EKG  performed which revealed PVC's. Patient asymptomatic and do not believe this warrants further workup at this time.   DM Controlled with metformin only at home. Last A1c 6.2 on 04/30/20. BG ~103-134s today. No hypoglycemic event -continue SSI  Diet:  Full liquids IVF:  D5 NS VTE:  lovenox Prior to Admission Living Arrangement:  home Anticipated Discharge Location:  home Dispo: Home today  Tanya Agee, MD 08/01/2020, 6:50 PM

## 2020-08-03 ENCOUNTER — Other Ambulatory Visit: Payer: Self-pay | Admitting: Internal Medicine

## 2020-08-03 MED ORDER — BUTALBITAL-APAP-CAFFEINE 50-325-40 MG PO TABS: ORAL_TABLET | ORAL | 0 refills | Status: DC

## 2020-08-12 ENCOUNTER — Telehealth: Payer: Self-pay

## 2020-08-12 NOTE — Telephone Encounter (Signed)
-----   Message from Elder Negus, NP sent at 08/12/2020  1:53 PM EDT ----- Regarding: RE: FRONT OFFICE PHONE MESSAGE Contact: [phone] Has she had follow up appointment with surgeon?  If so can you request records.  I feel like it is important for them to know this information.  Also set up an appointment with our office for follow up.    Sincerely,           Elder Negus, NP  ----- Message ----- From: Gregery Na, CMA Sent: 08/12/2020  10:13 AM EDT To: Elder Negus, NP Subject: FRONT OFFICE PHONE MESSAGE                     Had Bowel surgery on 07/27/2020.  Has had diarrhea since then & patient wants to know if she should be concerned?

## 2020-08-12 NOTE — Telephone Encounter (Signed)
Spoke with patient & she states that she has not spoken with surgeon but will she will reach out to them & have them fax the report to you.   Message sent to front office to schedule follow up appt as well

## 2020-08-21 ENCOUNTER — Encounter: Payer: Self-pay | Admitting: Adult Health

## 2020-08-21 ENCOUNTER — Ambulatory Visit: Payer: BC Managed Care – PPO | Admitting: Adult Health

## 2020-08-21 ENCOUNTER — Other Ambulatory Visit: Payer: Self-pay

## 2020-08-21 VITALS — BP 166/68 | HR 68 | Temp 97.7°F | Wt 181.0 lb

## 2020-08-21 DIAGNOSIS — I10 Essential (primary) hypertension: Secondary | ICD-10-CM

## 2020-08-21 DIAGNOSIS — N390 Urinary tract infection, site not specified: Secondary | ICD-10-CM

## 2020-08-21 DIAGNOSIS — Z9049 Acquired absence of other specified parts of digestive tract: Secondary | ICD-10-CM

## 2020-08-21 DIAGNOSIS — K56609 Unspecified intestinal obstruction, unspecified as to partial versus complete obstruction: Secondary | ICD-10-CM

## 2020-08-21 DIAGNOSIS — Z23 Encounter for immunization: Secondary | ICD-10-CM

## 2020-08-21 MED ORDER — HYDRALAZINE HCL 50 MG PO TABS
50.0000 mg | ORAL_TABLET | Freq: Three times a day (TID) | ORAL | 0 refills | Status: DC
Start: 2020-08-21 — End: 2020-09-15

## 2020-08-21 NOTE — Progress Notes (Signed)
Hospital follow up  Assessment and Plan:  Tanya [name] was seen today for hospitalization follow-up.  Diagnoses and all orders for this visit:  Small bowel obstruction (HCC) S/P small bowel resection Doing well; saw surgeon today; no concerns Discussed try soluble fiber for loose stools, imodium if frequent liquid Denies notable pain Follow up as needed.   Urinary tract infection without hematuria, site unspecified Follow up recheck for resolution; denies sx -     Urinalysis w microscopic + reflex cultur  Need for immunization against influenza -     FLU VACCINE MDCK QUAD W/Preservative  Primary hypertension Increase hydralazine to 50 mg TID; cautioned to resume previous 25 mg TID if BPs improve; work to reduce sodium; follow up as scheduled 3 weeks Monitor blood pressure at home; call if consistently over 140/80 Discussed DASH diet Advised to go to the ER if any CP, SOB, nausea, dizziness, severe HA, changes vision/speech, left arm numbness and tingling and jaw pain. Follow up in  -     hydrALAZINE (APRESOLINE) 50 MG tablet; Take 1 tablet (50 mg total) by mouth 3 (three) times daily. For blood pressure.  All medications were reviewed with patient and family and fully reconciled. All questions answered fully, and patient and family members were encouraged to call the office with any further questions or concerns. Discussed goal to avoid readmission related to this diagnosis.  Medications Discontinued During This Encounter  Medication Reason  . phentermine (ADIPEX-P) 37.5 MG tablet Discontinued by provider  . polyethylene glycol (MIRALAX / GLYCOLAX) 17 g packet Patient Preference  . hydrALAZINE (APRESOLINE) 25 MG tablet    Over 40 minutes of exam, counseling, chart review, and complex, high/moderate level critical decision making was performed this visit.   Future Appointments  Date Time Provider Department Center  09/09/2020  2:00 PM Judd Gaudier, NP [facility] None      HPI 59 y.o.female presents for follow up for transition from recent hospitalization or [facility] stay. Admit date to the hospital was 07/23/20, patient was discharged from the hospital on 08/01/20 and our clinical staff contacted the office the day after discharge to set up a follow up appointment. The discharge summary, medications, and diagnostic test results were reviewed before meeting with the patient. The patient was admitted for:   Small bowel obstruction S/p small bowel resection  59 y/o F with hx of sarcoidosis, HTN, HLD, DM, OSA developed abdominal pain, presented to UC and was treated for UTI without improvement in sx, KUB ordered at our office showed bowel loops and was referred to ED. CT confirmed SBO, initially with conservative therapy, NG tube, gastrografin however without resolution and ultimately underwent ex lap, lysis of adhesions and small bowel resection by Dr. Fredricka Bonine on 07/27/2020. Diet was progressed slowly as tolerated and felt stable for discharge on 08/01/2020. No significantly abnormal labs at discharge were recommended for follow up.   08/21/2020 hospital follow up: BP (!) 166/68   Pulse 68   Temp 97.7 F (36.5 C)   Wt 181 lb (82.1 kg)   SpO2 97%   BMI 30.12 kg/m   She reports has done well, good appetite, minimal pain, was doing well with tylenol if needed. Followed up with surgeon for suture removal 08/11/2020, and again for wound check today, pleased with progress. Eating small regular soft meals, lots of soup, initially some diarrhea but slowly firming up, was advised ok to use imodium if needed. Also discussed adding soluble fiber.   Notably she remained fairly hypertensive through  admission, hx of resistant htn, on ziac 10/62.5, olmesartan 40 mg, lasix 40 mg BID, clonidine 0.2 mg BID, hydralazine 25 mg TID for many years and had been stable. Notably recent CT did now show small kidney suggestive of RAS.   She reports has been checking BPs at home, 150s/80,  today their BP is BP: (!) 166/68 She denies chest pain, shortness of breath, dizziness.   She has been working on diet and exercise for T2 diabetes, treated by metformin 2000 mg daily and denies hyperglycemia, hypoglycemia , increased appetite, nausea, paresthesia of the feet, polydipsia, polyuria and visual disturbances.  Fasting running 97-121.  Last A1C in the office was:  Lab Results  Component Value Date   HGBA1C 6.3 (H) 07/23/2020    Home health is not involved.   Images while in the hospital: CT ABDOMEN PELVIS W CONTRAST  Result Date: 07/23/2020 CLINICAL DATA:  Diffuse abdominal pain for 3 days. History of appendectomy and hysterectomy EXAM: CT ABDOMEN AND PELVIS WITH CONTRAST TECHNIQUE: Multidetector CT imaging of the abdomen and pelvis was performed using the standard protocol following bolus administration of intravenous contrast. CONTRAST:  100mL OMNIPAQUE IOHEXOL 300 MG/ML  SOLN COMPARISON:  Abdominal x-ray 07/22/2020 FINDINGS: Lower chest: Layering small right-sided pleural effusion. Left lung bases clear. Heart size is within normal limits. Hepatobiliary: No focal liver abnormality is seen. No gallstones, gallbladder wall thickening, or biliary dilatation. Pancreas: Unremarkable. No pancreatic ductal dilatation or surrounding inflammatory changes. Spleen: Normal in size without focal abnormality. Adrenals/Urinary Tract: Unremarkable adrenal glands. 4 mm nonobstructing stone within the lower pole of the left kidney. 1.8 cm right renal sinus cyst. No hydronephrosis. Urinary bladder is largely decompressed but appears grossly unremarkable. Stomach/Bowel: Numerous dilated loops of small bowel throughout the abdomen measuring up to 3.5 cm in diameter. Small bowel is largely fluid-filled. The stomach is also dilated and fluid-filled. There is transition point to decompressed small bowel within the right lower quadrant near the base of the cecum (series 7, images 41-44). There is a small  volume of stool within the colon. The colon is otherwise decompressed. Vascular/Lymphatic: No significant vascular findings are present. No enlarged abdominal or pelvic lymph nodes. Reproductive: Status post hysterectomy. No adnexal masses. Other: Trace volume of free fluid within the right lower quadrant. No abdominopelvic fluid collection. No pneumoperitoneum. Tiny fat containing umbilical hernia. Musculoskeletal: No acute or significant osseous findings. IMPRESSION: 1. Small-bowel obstruction with transition point within the right lower quadrant near the base of the cecum, presumably secondary to adhesions. 2. Trace free fluid within the right lower quadrant, likely reactive. No organized fluid collection or pneumoperitoneum. 3. Small right-sided pleural effusion. 4. Nonobstructing 4 mm left renal stone. These results were called by telephone at the time of interpretation on 07/23/2020 at 2:20 pm to provider Pricilla LovelessSCOTT GOLDSTON , who verbally acknowledged these results. Electronically Signed   By: Duanne GuessNicholas  Plundo D.O.   On: 07/23/2020 14:20   DG Abd Portable 1V-Small Bowel Obstruction Protocol-initial, 8 hr delay  Result Date: 07/24/2020 CLINICAL DATA:  Small-bowel obstruction EXAM: PORTABLE ABDOMEN - 1 VIEW COMPARISON:  07/23/2020 FINDINGS: The enteric tube projects over the stomach. If the patient was administered oral contrast it appears that the majority of the contrast by the resides within the stomach or small bowel. There is no clear oral contrast within the colon. IMPRESSION: The enteric tube projects over the stomach. No oral contrast visualized in the colon. Electronically Signed   By: Katherine Mantlehristopher  Green M.D.   On: 07/24/2020  19:08   DG Abd Portable 1V-Small Bowel Protocol-Position Verification  Result Date: 07/23/2020 CLINICAL DATA:  NG tube placement EXAM: PORTABLE ABDOMEN - 1 VIEW COMPARISON:  07/22/2020 FINDINGS: The enteric tube tip projects over the gastric body. Again noted are dilated loops  of small bowel consistent with the previously described small bowel obstruction. Excreted contrast is noted within the collecting system. IMPRESSION: 1. Enteric tube projects over the gastric body. 2. Persistent small bowel obstruction. Electronically Signed   By: Katherine Mantle M.D.   On: 07/23/2020 17:29     Current Outpatient Medications (Endocrine & Metabolic):  .  metFORMIN (GLUCOPHAGE-XR) 500 MG 24 hr tablet, Take 2 tablets 2 x /day with Meals for Diabetes (Patient taking differently: Take 1,000 mg by mouth 2 (two) times daily with a meal. Take 2 tablets 2 x /day with Meals for Diabetes)  Current Outpatient Medications (Cardiovascular):  .  bisoprolol-hydrochlorothiazide (ZIAC) 10-6.25 MG tablet, TAKE 1 TABLET DAILY FOR BLOOD PRESSURE (Patient taking differently: Take 1 tablet by mouth daily. TAKE 1 TABLET DAILY FOR BLOOD PRESSURE) .  cloNIDine (CATAPRES) 0.2 MG tablet, TAKE 1 TABLET BY MOUTH TWICE A DAY FOR BLOOD PRESSURE (Patient taking differently: Take 0.2 mg by mouth 2 (two) times daily. TAKE 1 TABLET BY MOUTH TWICE A DAY FOR BLOOD PRESSURE) .  furosemide (LASIX) 40 MG tablet, Take 1 tablet    2 x /day    for BP  and Fluid Retention / Ankle Swelling (Patient taking differently: Take 40 mg by mouth 2 (two) times daily. Take 1 tablet    2 x /day    for BP  and Fluid Retention / Ankle Swelling) .  hydrALAZINE (APRESOLINE) 50 MG tablet, Take 1 tablet (50 mg total) by mouth 3 (three) times daily. For blood pressure. .  olmesartan (BENICAR) 40 MG tablet, TAKE 1 TABLET DAILY FOR BP & DIABETIC KIDNEY PROTECTION (Patient taking differently: Take 40 mg by mouth daily. Take 1 tablet Daily  for BP  & Diabetic Kidney Protection)  Current Outpatient Medications (Respiratory):  .  fluticasone (FLONASE) 50 MCG/ACT nasal spray, Place 2 sprays into both nostrils daily. (Patient taking differently: Place 2 sprays into both nostrils daily as needed for allergies. )  Current Outpatient Medications  (Analgesics):  .  aspirin EC 81 MG tablet, Take 81 mg by mouth daily.   .  butalbital-acetaminophen-caffeine (FIORICET) 50-325-40 MG tablet, Take        1 to 2 tablets    4 x  /day as needed for Pain or Headache  Current Outpatient Medications (Hematological):  Marland Kitchen  Cyanocobalamin (VITAMIN B-12 SL), Place 1 tablet under the tongue daily.   Current Outpatient Medications (Other):  .  diclofenac sodium (VOLTAREN) 1 % GEL, Apply 2 g topically 4 (four) times daily. (Patient taking differently: Apply 2 g topically 4 (four) times daily as needed (pain). ) .  gabapentin (NEURONTIN) 100 MG capsule, Take 1 capsule 3 times a day for Pain (Patient taking differently: Take 100 mg by mouth 3 (three) times daily as needed (pain). Take 1 capsule 3 times a day for Pain) .  Magnesium 250 MG TABS, Take 250 mg by mouth daily. .  Multiple Vitamins-Minerals (MULTIVITAMIN PO), Take by mouth daily. .  RESTASIS 0.05 % ophthalmic emulsion, Place 1 drop into both eyes 3 (three) times daily as needed for dry eyes. .  Vitamin D, Ergocalciferol, (DRISDOL) 1.25 MG (50000 UNIT) CAPS capsule, Take 1 capsule 2 x /week for Severe Vitamin D Deficiency (Patient  taking differently: Take 50,000 Units by mouth every 7 (seven) days. Take 1 capsule x /week for Severe Vitamin D Deficiency (Tuesday)) .  docusate sodium (COLACE) 100 MG capsule, Take 1 capsule (100 mg total) by mouth 2 (two) times daily. (Patient not taking: Reported on 08/21/2020)  Past Medical History:  Diagnosis Date  . Allergy   . Diabetes mellitus   . Family history of adverse reaction to anesthesia    " My Mother would vomit "  . Goiter   . Hyperlipidemia   . Hypertension   . Morbid obesity (HCC) 12/24/2013  . OSA (obstructive sleep apnea)   . Palpitations 08/01/2016  . Sarcoid   . Sarcoidosis   . SBO (small bowel obstruction) (HCC) 07/25/2020  . Vitamin D deficiency      Allergies  Allergen Reactions  . Amlodipine Swelling  . Naproxen Hives  . Meloxicam  & Diet Manage Prod Rash    ROS: all negative except above.   Physical Exam: Filed Weights   08/21/20 1635  Weight: 181 lb (82.1 kg)   BP (!) 166/68   Pulse 68   Temp 97.7 F (36.5 C)   Wt 181 lb (82.1 kg)   SpO2 97%   BMI 30.12 kg/m  General Appearance: Well nourished, in no apparent distress. Eyes: PERRLA, EOMs, conjunctiva no swelling or erythema Sinuses: No Frontal/maxillary tenderness ENT/Mouth: Ext aud canals clear, TMs without erythema, bulging. No erythema, swelling, or exudate on post pharynx.  Tonsils not swollen or erythematous. Hearing normal.  Neck: Supple, thyroid normal.  Respiratory: Respiratory effort normal, BS equal bilaterally without rales, rhonchi, wheezing or stridor.  Cardio: RRR with no MRGs. Brisk peripheral pulses without edema.  Abdomen: Soft, + BS.  Non tender, no guarding, rebound, hernias, masses. She has well healing midline incision without erythema, tenderness or discharge Lymphatics: Non tender without lymphadenopathy.  Musculoskeletal: Full ROM, 5/5 strength, normal gait.  Skin: Warm, dry without rashes, lesions, ecchymosis.  Neuro: Cranial nerves intact. Normal muscle tone, no cerebellar symptoms. Sensation intact.  Psych: Awake and oriented X 3, normal affect, Insight and Judgment appropriate.     Dan Maker, NP 5:44 PM [facility]

## 2020-08-21 NOTE — Patient Instructions (Addendum)
Goals    . Blood Pressure < 130/80    . HEMOGLOBIN A1C < 5.7    . LDL CALC < 70    . Weight (lb) < 185 lb (83.9 kg)      Increase hydralazine to new dose 50 mg TID   Monitor blood pressures closely - IF GETTING LOW AFTER REDUCING SALT - RESUME 25 MG TID DOSE     Add soluble fiber - citrucel, benefiber - daily   Look for DASH recipes or mediterranean diet Limit precooked/processed foods - ALL high in sodium   Cook fresh from scratch    HYPERTENSION INFORMATION  Monitor your blood pressure at home, please keep a record and bring that in with you to your next office visit.   Go to the ER if any CP, SOB, nausea, dizziness, severe HA, changes vision/speech  Testing/Procedures: HOW TO TAKE YOUR BLOOD PRESSURE:  Rest 5 minutes before taking your blood pressure.  Don't smoke or drink caffeinated beverages for at least 30 minutes before.  Take your blood pressure before (not after) you eat.  Sit comfortably with your back supported and both feet on the floor (don't cross your legs).  Elevate your arm to heart level on a table or a desk.  Use the proper sized cuff. It should fit smoothly and snugly around your bare upper arm. There should be enough room to slip a fingertip under the cuff. The bottom edge of the cuff should be 1 inch above the crease of the elbow.  Your most recent BP: BP: (!) 166/68   Take your medications faithfully as instructed. Maintain a healthy weight. Get at least 150 minutes of aerobic exercise per week. Minimize salt intake. Minimize alcohol intake  DASH Eating Plan DASH stands for "Dietary Approaches to Stop Hypertension." The DASH eating plan is a healthy eating plan that has been shown to reduce high blood pressure (hypertension). Additional health benefits may include reducing the risk of type 2 diabetes mellitus, heart disease, and stroke. The DASH eating plan may also help with weight loss. WHAT DO I NEED TO KNOW ABOUT THE DASH EATING  PLAN? For the DASH eating plan, you will follow these general guidelines:  Choose foods with a percent daily value for sodium of less than 5% (as listed on the food label).  Use salt-free seasonings or herbs instead of table salt or sea salt.  Check with your health care provider or pharmacist before using salt substitutes.  Eat lower-sodium products, often labeled as "lower sodium" or "no salt added."  Eat fresh foods.  Eat more vegetables, fruits, and low-fat dairy products.  Choose whole grains. Look for the word "whole" as the first word in the ingredient list.  Choose fish and skinless chicken or [place] more often than red meat. Limit fish, poultry, and meat to 6 oz (170 g) each day.  Limit sweets, desserts, sugars, and sugary drinks.  Choose heart-healthy fats.  Limit cheese to 1 oz (28 g) per day.  Eat more home-cooked food and less restaurant, buffet, and fast food.  Limit fried foods.  Cook foods using methods other than frying.  Limit canned vegetables. If you do use them, rinse them well to decrease the sodium.  When eating at a restaurant, ask that your food be prepared with less salt, or no salt if possible. WHAT FOODS CAN I EAT? Seek help from a dietitian for individual calorie needs. Grains Whole grain or whole wheat bread. Brown rice. Whole grain or  whole wheat pasta. Quinoa, bulgur, and whole grain cereals. Low-sodium cereals. Corn or whole wheat flour tortillas. Whole grain cornbread. Whole grain crackers. Low-sodium crackers. Vegetables Fresh or frozen vegetables (raw, steamed, roasted, or grilled). Low-sodium or reduced-sodium tomato and vegetable juices. Low-sodium or reduced-sodium tomato sauce and paste. Low-sodium or reduced-sodium canned vegetables.  Fruits All fresh, canned (in natural juice), or frozen fruits. Meat and Other Protein Products Ground beef (85% or leaner), grass-fed beef, or beef trimmed of fat. Skinless chicken or [place]. Ground  chicken or [place]. Pork trimmed of fat. All fish and seafood. Eggs. Dried beans, peas, or lentils. Unsalted nuts and seeds. Unsalted canned beans. Dairy Low-fat dairy products, such as skim or 1% milk, 2% or reduced-fat cheeses, low-fat ricotta or cottage cheese, or plain low-fat yogurt. Low-sodium or reduced-sodium cheeses. Fats and Oils Tub margarines without trans fats. Light or reduced-fat mayonnaise and salad dressings (reduced sodium). Avocado. Safflower, olive, or canola oils. Natural peanut or almond butter. Other Unsalted popcorn and pretzels. The items listed above may not be a complete list of recommended foods or beverages. Contact your dietitian for more options. WHAT FOODS ARE NOT RECOMMENDED? Grains White bread. White pasta. White rice. Refined cornbread. Bagels and croissants. Crackers that contain trans fat. Vegetables Creamed or fried vegetables. Vegetables in a cheese sauce. Regular canned vegetables. Regular canned tomato sauce and paste. Regular tomato and vegetable juices. Fruits Dried fruits. Canned fruit in light or heavy syrup. Fruit juice. Meat and Other Protein Products Fatty cuts of meat. Ribs, chicken wings, bacon, sausage, bologna, salami, chitterlings, fatback, hot dogs, bratwurst, and packaged luncheon meats. Salted nuts and seeds. Canned beans with salt. Dairy Whole or 2% milk, cream, half-and-half, and cream cheese. Whole-fat or sweetened yogurt. Full-fat cheeses or blue cheese. Nondairy creamers and whipped toppings. Processed cheese, cheese spreads, or cheese curds. Condiments Onion and garlic salt, seasoned salt, table salt, and sea salt. Canned and packaged gravies. Worcestershire sauce. Tartar sauce. Barbecue sauce. Teriyaki sauce. Soy sauce, including reduced sodium. Steak sauce. Fish sauce. Oyster sauce. Cocktail sauce. Horseradish. Ketchup and mustard. Meat flavorings and tenderizers. Bouillon cubes. Hot sauce. Tabasco sauce. Marinades. Taco seasonings.  Relishes. Fats and Oils Butter, stick margarine, lard, shortening, ghee, and bacon fat. Coconut, palm kernel, or palm oils. Regular salad dressings. Other Pickles and olives. Salted popcorn and pretzels. The items listed above may not be a complete list of foods and beverages to avoid. Contact your dietitian for more information. WHERE CAN I FIND MORE INFORMATION? National Heart, Lung, and Blood Institute: [employer] Document Released: 10/07/2011 Document Revised: 03/04/2014 Document Reviewed: 08/22/2013 [facility] Patient Information 2015 [place], [place]. This information is not intended to replace advice given to you by your health care provider. Make sure you discuss any questions you have with your health care provider.

## 2020-08-22 LAB — URINALYSIS W MICROSCOPIC + REFLEX CULTURE
Bacteria, UA: NONE SEEN /HPF
Bilirubin Urine: NEGATIVE
Glucose, UA: NEGATIVE
Hgb urine dipstick: NEGATIVE
Hyaline Cast: NONE SEEN /LPF
Ketones, ur: NEGATIVE
Leukocyte Esterase: NEGATIVE
Nitrites, Initial: NEGATIVE
Protein, ur: NEGATIVE
Specific Gravity, Urine: 1.015 (ref 1.001–1.03)
Squamous Epithelial / HPF: NONE SEEN /HPF (ref <=5)
pH: 5.5 (ref 5.0–8.0)

## 2020-08-22 LAB — NO CULTURE INDICATED

## 2020-09-08 DIAGNOSIS — Z9049 Acquired absence of other specified parts of digestive tract: Secondary | ICD-10-CM | POA: Insufficient documentation

## 2020-09-08 DIAGNOSIS — E538 Deficiency of other specified B group vitamins: Secondary | ICD-10-CM | POA: Insufficient documentation

## 2020-09-08 NOTE — Progress Notes (Signed)
Complete Physical  Assessment and Plan:  Encounter for general adult medical examination with abnormal findings - follow up GYN  Tortuous aorta (HCC) Control blood pressure, cholesterol, glucose, increase exercise.   Essential hypertension - historically labile, recently persistently elevated following admission - increase clonidine 0.2 mg to TID, goal <130/80, recheck in 7-10 days - then consider retrying low dose amlodipine - hx of edema, but suspect tolerated low doses, try 2.5-5 mg  - if not improved, consider RAS, possible cardiology follow up - DASH diet, exercise and monitor at home.  -     CBC with Differential/Platelet -     CMP/GFR -     TSH -     Urinalysis, Routine w reflex microscopic -     Microalbumin / creatinine urine ratio -     EKG 12-Lead  Diabetes mellitus without complication (HCC) Discussed general issues about diabetes pathophysiology and management., Educational material distributed., Suggested low cholesterol diet., Encouraged aerobic exercise., Discussed foot care., Reminded to get yearly retinal exam prior to end of the year -     Hemoglobin A1c - defer as had <90 days ago  OSA (obstructive sleep apnea) Sleep apnea- continue CPAP, CPAP is helping with daytime fatigue, weight loss still advised.   Mixed hyperlipidemia associated with T2DM (HCC) She has been very near goal of LDL <70 by lifestyle, will do low dose low frequency rosuvastatin 5 mg once a week check lipids, decrease fatty foods, increase activity. -     Lipid panel  Obesity - BMI 31 - long discussion about weight loss, diet, and exercise - excellent progress with exercise  - phentermine was helpful, did tolerate without SE but stopped and continue to hold due to recent elevated BP - continue close q37m follow up   Vitamin D deficiency Continue supplementation Check vitamin D level  Goiter Biopsy negative, stable on f/u US 12/2018, monitor - TSH  Environmental allergies Continue  OTC allergy pills  Chronic right-sided low back pain without sciatica Established with Dr. Magnus Ivan, gabapentin PRN but hasn't needed Improved with walking and weight loss  Medication management -     Magnesium  B12 deficiency       -      Vitamin B12   Anemia, unspecified type  -     Iron and TIBC -     Vitamin B12  R foot edema With small wound, ? Mild infection, will do course of doxycycline Elevate, compression, ice PRN, monitor; follow up if not improving, consider xray if persistent  Murmur New mild 2/6 systolic, monitor Consider cardiology/ECHO, need improved BP control   Discussed med's effects and SE's. Screening labs and tests as requested with regular follow-up as recommended. Over 40 minutes of exam, counseling, chart review, and complex, high level critical decision making was performed this visit.   Future Appointments  Date Time Provider Department Center  09/10/2021 10:00 AM Judd Gaudier, NP [facility] None    HPI  59 y.o. female, presents for a complete physical and follow up for has Hypertension; Hyperlipidemia associated with type 2 diabetes mellitus (HCC); Type 2 diabetes mellitus with obesity (HCC); Environmental allergies; Obesity hypoventilation syndrome (HCC) ; Goiter; Vitamin D deficiency; Obesity (BMI 30.0-34.9); Tortuous aorta (HCC); Carpal tunnel syndrome, left upper limb; S/P small bowel resection; and B12 deficiency on their problem list.   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   On Friday had pedicure, R foot 1st toe was nicked now has mild swelling and  erythema of foot, some tenderness with pressure from shoe  This year she had SBO and had resection in 07/2020, has done well since.  She has hx of right hip/back pain, and bilateral knee pain, seeing Dr. Magnus IvanBlackman, but reports symptoms improved with daily walking. Takes gabapentin PRN pain but hasn't needed.   She has OSA, on CPAP, does well with this, but admits  only wearing 3 days a week or so. Wants a follow up test at some point as last remote but declines at this time in light of covid.   BMI is Body mass index is 31.49 kg/m., she has been working on diet and exercise. Walking 3 miles daily, walking with a group of ladies in her neighborhood.   She is doing smaller portions, limiting carbs, feels can sustain this long run.  She is pushing water intake, getting at least 80 fluid ounces daily.  Off of phentermine  Wt Readings from Last 3 Encounters:  09/09/20 180 lb 9.6 oz (81.9 kg)  08/21/20 181 lb (82.1 kg)  07/23/20 192 lb (87.1 kg)   She does check blood pressures at home, recently ranging 140-180/70s since recent admission for SBO, today their BP is BP: (!) 166/70  Currently taking ziac 10/6.25, lasix 40 mg BID, olmesartan 40 mg daily, clonidine 0.2 mg BID, hydralazine was recently increased from 25 to 50 mg TID without notable improvement  She does workout. She denies chest pain, shortness of breath, dizziness.  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 Ophelia CharterYates in [place], had normal CT chest 2012, normal CXR 2017 other than tortuous aorta.   She is not on cholesterol medication and denies myalgias. Her cholesterol is not at goal of LDL <70. The cholesterol last visit was:   Lab Results  Component Value Date   CHOL 189 04/30/2020   HDL 98 04/30/2020   LDLCALC 77 04/30/2020   TRIG 63 04/30/2020   CHOLHDL 1.9 04/30/2020   She has been working on diet and exercise for Diabetes without complications, currently treated by metformin 2000 mg daily, she is on bASA, she is on ACE/ARB, does not check her sugars at home, and denies nausea, paresthesia of the feet, polydipsia and polyuria. She does check fasting glucose, running 125-130. Last A1C was:  Lab Results  Component Value Date   HGBA1C 6.3 (H) 07/23/2020   Last GFR: Lab  Results  Component Value Date   GFRAA >60 08/01/2020   She has hx of goiter with nodule, had negative biopsy in 2012, f/u thyroid US 12/2018 showed nodule was stable/minimal changes.  Lab Results  Component Value Date   TSH 0.49 04/30/2020   Patient is on Vitamin D supplement.   Lab Results  Component Value Date   VD25OH 124 (H) 04/30/2020          Current Medications:  Current Outpatient Medications on File Prior to Visit  Medication Sig Dispense Refill  . aspirin EC 81 MG tablet Take 81 mg by mouth daily.      . bisoprolol-hydrochlorothiazide (ZIAC) 10-6.25 MG tablet TAKE 1 TABLET DAILY FOR BLOOD PRESSURE (Patient taking differently: Take 1 tablet by mouth daily. TAKE 1 TABLET DAILY FOR BLOOD PRESSURE) 90 tablet 0  . cloNIDine (CATAPRES) 0.2 MG tablet TAKE 1 TABLET BY MOUTH TWICE A DAY FOR BLOOD PRESSURE (Patient taking differently: Take 0.2 mg by mouth 2 (two) times daily.  TAKE 1 TABLET BY MOUTH TWICE A DAY FOR BLOOD PRESSURE) 180 tablet 3  . Cyanocobalamin (VITAMIN B-12 SL) Place 1 tablet under the tongue daily.     . diclofenac sodium (VOLTAREN) 1 % GEL Apply 2 g topically 4 (four) times daily. (Patient taking differently: Apply 2 g topically 4 (four) times daily as needed (pain). ) 100 g 3  . fluticasone (FLONASE) 50 MCG/ACT nasal spray Place 2 sprays into both nostrils daily. (Patient taking differently: Place 2 sprays into both nostrils daily as needed for allergies. ) 16 g 6  . furosemide (LASIX) 40 MG tablet Take 1 tablet    2 x /day    for BP  and Fluid Retention / Ankle Swelling (Patient taking differently: Take 40 mg by mouth 2 (two) times daily. Take 1 tablet    2 x /day    for BP  and Fluid Retention / Ankle Swelling) 180 tablet 0  . gabapentin (NEURONTIN) 100 MG capsule Take 1 capsule 3 times a day for Pain (Patient taking differently: Take 100 mg by mouth 3 (three) times daily as needed (pain). Take 1 capsule 3 times a day for Pain) 270 capsule 1  . hydrALAZINE  (APRESOLINE) 50 MG tablet Take 1 tablet (50 mg total) by mouth 3 (three) times daily. For blood pressure. 90 tablet 0  . Magnesium 250 MG TABS Take 250 mg by mouth daily.    . metFORMIN (GLUCOPHAGE-XR) 500 MG 24 hr tablet Take 2 tablets 2 x /day with Meals for Diabetes (Patient taking differently: Take 1,000 mg by mouth 2 (two) times daily with a meal. Take 2 tablets 2 x /day with Meals for Diabetes) 360 tablet 3  . Multiple Vitamins-Minerals (MULTIVITAMIN PO) Take by mouth daily.    Marland Kitchen olmesartan (BENICAR) 40 MG tablet TAKE 1 TABLET DAILY FOR BP & DIABETIC KIDNEY PROTECTION (Patient taking differently: Take 40 mg by mouth daily. Take 1 tablet Daily  for BP  & Diabetic Kidney Protection) 90 tablet 1  . RESTASIS 0.05 % ophthalmic emulsion Place 1 drop into both eyes 3 (three) times daily as needed for dry eyes.    . Vitamin D, Ergocalciferol, (DRISDOL) 1.25 MG (50000 UNIT) CAPS capsule Take 1 capsule 2 x /week for Severe Vitamin D Deficiency (Patient taking differently: Take 50,000 Units by mouth every 7 (seven) days. Take 1 capsule x /week for Severe Vitamin D Deficiency (Tuesday)) 26 capsule 1  . butalbital-acetaminophen-caffeine (FIORICET) 50-325-40 MG tablet Take        1 to 2 tablets    4 x  /day as needed for Pain or Headache (Patient not taking: Reported on 09/09/2020) 30 tablet 0   No current facility-administered medications on file prior to visit.   Allergies:  Allergies  Allergen Reactions  . Amlodipine Swelling  . Naproxen Hives  . Meloxicam & Diet Manage Prod Rash   Medical History:  She has Hypertension; Hyperlipidemia associated with type 2 diabetes mellitus (HCC); Type 2 diabetes mellitus with obesity (HCC); Environmental allergies; Obesity hypoventilation syndrome (HCC) ; Goiter; Vitamin D deficiency; Obesity (BMI 30.0-34.9); Tortuous aorta (HCC); Carpal tunnel syndrome, left upper limb; S/P small bowel resection; and B12 deficiency on their problem list.  Health Maintenance:    Immunization History  Administered Date(s) Administered  . Influenza Inj Mdck Quad With Preservative 07/17/2018, 08/15/2019, 08/21/2020  . Influenza Split 07/18/2013, 10/01/2014  . Influenza,inj,quad, With Preservative 09/02/2016  . PFIZER SARS-COV-2 Vaccination 01/05/2020, 02/12/2020  . Pneumococcal Polysaccharide-23 06/16/1999  . Tdap  03/10/2009, 07/18/2019   Tetanus: 2020 Pneumovax: 2000, due age 89 Flu vaccine: 08/2020 Shingles: DUE, wants to call and ask Covid 19: 2/2, 2021, pfizer, just had booster   LMP:  Hysterectomy Pelvic/PAP: last by GYN, s/p hysterectomy, never abnormal, DONE MGM:  10/2019, has planned, deferring due to booster, 6-8 weeks Colonoscopy: 2014 due 10 years Echo 2017  Last Eye Exam: Dr. Hyacinth Meeker, 08/21/2020- report verified and abstracted- DUE - she will schedule Last Dental Exam: Dr. Dan Humphreys, last visit 2021, goes q92m  Patient Care Team: Lucky Cowboy, MD as PCP - General (Internal Medicine) Kathryne Hitch, MD as Consulting Physician (Orthopedic Surgery)  Surgical History:  She has a past surgical history that includes Eye surgery; achiles tendon; Abdominal hysterectomy; Tonsillectomy; and laparotomy (N/A, 07/27/2020). Family History:  Herfamily history includes Diabetes in her father and paternal grandmother; Heart disease in her father, paternal grandmother, and sister; Hypertension in her brother, mother, and sister. Social History:  She reports that she has never smoked. She has never used smokeless tobacco. She reports current alcohol use. She reports that she does not use drugs.  Review of Systems: Review of Systems  Constitutional: Negative for chills, fever and malaise/fatigue.  HENT: Negative for congestion, ear pain and sore throat.   Eyes: Negative.   Respiratory: Negative for cough, shortness of breath and wheezing.   Cardiovascular: Negative for chest pain, palpitations and leg swelling.  Gastrointestinal: Negative for  abdominal pain, blood in stool, constipation, diarrhea, heartburn and melena.  Genitourinary: Negative.   Musculoskeletal: Negative for back pain, falls, joint pain, myalgias and neck pain.  Skin: Negative.   Neurological: Negative for dizziness, sensory change, loss of consciousness and headaches.  Psychiatric/Behavioral: Negative for depression. The patient is not nervous/anxious and does not have insomnia.     Physical Exam: Estimated body mass index is 31.49 kg/m as calculated from the following:   Height as of this encounter: 5' 3.5" (1.613 m).   Weight as of this encounter: 180 lb 9.6 oz (81.9 kg). BP (!) 166/70   Pulse 72   Temp 97.9 F (36.6 C)   Ht 5' 3.5" (1.613 m)   Wt 180 lb 9.6 oz (81.9 kg)   SpO2 98%   BMI 31.49 kg/m  General Appearance: Well nourished, in no apparent distress.  Eyes: PERRLA, EOMs, conjunctiva no swelling or erythema, fundal exam deferred to ophthalmology  Sinuses: No Frontal/maxillary tenderness  ENT/Mouth: Ext aud canals clear, normal light reflex with TMs without erythema, bulging. Good dentition. No erythema, swelling, or exudate on post pharynx. Tonsils not swollen or erythematous. Hearing normal.  Neck: Supple, thyroid mildly enlarged on right. No bruits  Respiratory: Respiratory effort normal, BS equal bilaterally without rales, rhonchi, wheezing or stridor.  Cardio: RRR, mild early systolic 2/6 murmur, without rubs or gallops. Brisk peripheral pulses without ankle edema, mild foot mild non-pitting edema Chest: symmetric, with normal excursions and percussion.  Breasts: breasts appear normal, no suspicious masses, no skin or nipple changes or axillary nodes. Abdomen: Soft, nontender, no guarding, rebound, hernias, masses, or organomegaly.  Lymphatics: Non tender without lymphadenopathy.  Genitourinary: Defer to GYN Musculoskeletal: Full ROM all peripheral extremities,5/5 strength, and normal gait. R foot without palpable abnormality, non-tender.   Skin: Warm, dry without rashes, lesions, ecchymosis. She has small scabbed wound to right lateral 1st digit, foot mildly puffy, without notable heat, erythema Neuro: Cranial nerves intact, reflexes equal bilaterally. Normal muscle tone, no cerebellar symptoms. Sensation intact. Sensation intact to monofilament bilateral feet.  Psych:  Awake and oriented X 3, normal affect, Insight and Judgment appropriate.   EKG: Sinus rhythm, no ST changes.   Carlyon Shadow Adarrius Graeff 2:25 PM [facility]

## 2020-09-09 ENCOUNTER — Ambulatory Visit: Payer: BC Managed Care – PPO | Admitting: Adult Health

## 2020-09-09 ENCOUNTER — Other Ambulatory Visit: Payer: Self-pay

## 2020-09-09 ENCOUNTER — Encounter: Payer: Self-pay | Admitting: Adult Health

## 2020-09-09 VITALS — BP 166/70 | HR 72 | Temp 97.9°F | Ht 63.5 in | Wt 180.6 lb

## 2020-09-09 DIAGNOSIS — I10 Essential (primary) hypertension: Secondary | ICD-10-CM | POA: Diagnosis not present

## 2020-09-09 DIAGNOSIS — Z130 Encounter for screening for diseases of the blood and blood-forming organs and certain disorders involving the immune mechanism: Secondary | ICD-10-CM

## 2020-09-09 DIAGNOSIS — Z136 Encounter for screening for cardiovascular disorders: Secondary | ICD-10-CM

## 2020-09-09 DIAGNOSIS — I771 Stricture of artery: Secondary | ICD-10-CM

## 2020-09-09 DIAGNOSIS — E66811 Obesity, class 1: Secondary | ICD-10-CM

## 2020-09-09 DIAGNOSIS — Z13220 Encounter for screening for lipoid disorders: Secondary | ICD-10-CM

## 2020-09-09 DIAGNOSIS — E785 Hyperlipidemia, unspecified: Secondary | ICD-10-CM

## 2020-09-09 DIAGNOSIS — R600 Localized edema: Secondary | ICD-10-CM

## 2020-09-09 DIAGNOSIS — E559 Vitamin D deficiency, unspecified: Secondary | ICD-10-CM

## 2020-09-09 DIAGNOSIS — D649 Anemia, unspecified: Secondary | ICD-10-CM

## 2020-09-09 DIAGNOSIS — L089 Local infection of the skin and subcutaneous tissue, unspecified: Secondary | ICD-10-CM

## 2020-09-09 DIAGNOSIS — Z9109 Other allergy status, other than to drugs and biological substances: Secondary | ICD-10-CM

## 2020-09-09 DIAGNOSIS — Z0000 Encounter for general adult medical examination without abnormal findings: Secondary | ICD-10-CM | POA: Diagnosis not present

## 2020-09-09 DIAGNOSIS — E669 Obesity, unspecified: Secondary | ICD-10-CM

## 2020-09-09 DIAGNOSIS — E662 Morbid (severe) obesity with alveolar hypoventilation: Secondary | ICD-10-CM

## 2020-09-09 DIAGNOSIS — E1169 Type 2 diabetes mellitus with other specified complication: Secondary | ICD-10-CM

## 2020-09-09 DIAGNOSIS — Z9049 Acquired absence of other specified parts of digestive tract: Secondary | ICD-10-CM

## 2020-09-09 DIAGNOSIS — Z1329 Encounter for screening for other suspected endocrine disorder: Secondary | ICD-10-CM | POA: Diagnosis not present

## 2020-09-09 DIAGNOSIS — Z79899 Other long term (current) drug therapy: Secondary | ICD-10-CM | POA: Diagnosis not present

## 2020-09-09 DIAGNOSIS — Z1389 Encounter for screening for other disorder: Secondary | ICD-10-CM

## 2020-09-09 DIAGNOSIS — R011 Cardiac murmur, unspecified: Secondary | ICD-10-CM

## 2020-09-09 DIAGNOSIS — E538 Deficiency of other specified B group vitamins: Secondary | ICD-10-CM

## 2020-09-09 DIAGNOSIS — E049 Nontoxic goiter, unspecified: Secondary | ICD-10-CM

## 2020-09-09 MED ORDER — DOXYCYCLINE HYCLATE 100 MG PO CAPS: ORAL_CAPSULE | ORAL | 0 refills | Status: DC

## 2020-09-09 MED ORDER — VITAMIN D (ERGOCALCIFEROL) 1.25 MG (50000 UNIT) PO CAPS: 50000.0000 [IU] | ORAL_CAPSULE | ORAL | 3 refills | Status: DC

## 2020-09-09 MED ORDER — ROSUVASTATIN CALCIUM 5 MG PO TABS: 5.0000 mg | ORAL_TABLET | Freq: Every day | ORAL | 3 refills | Status: DC

## 2020-09-09 MED ORDER — CLONIDINE HCL 0.2 MG PO TABS: ORAL_TABLET | ORAL | 3 refills | Status: DC

## 2020-09-09 NOTE — Patient Instructions (Signed)
Tanya [name] , Thank you for taking time to come for your Medicare Wellness Visit. I appreciate your ongoing commitment to your health goals. Please review the following plan we discussed and let me know if I can assist you in the future.   These are the goals we discussed: Goals    . Blood Pressure < 130/80    . HEMOGLOBIN A1C < 5.7    . LDL CALC < 70    . Weight (lb) < 185 lb (83.9 kg)       This is a list of the screening recommended for you and due dates:  Health Maintenance  Topic Date Due  . Eye exam for diabetics  08/21/2020  . Hemoglobin A1C  01/20/2021  . Complete foot exam   09/09/2021  . Mammogram  10/04/2021  . Colon Cancer Screening  11/24/2022  . Tetanus Vaccine  07/17/2029  . Flu Shot  Completed  . Pneumococcal vaccine  Completed  . COVID-19 Vaccine  Completed  .  Hepatitis C: One time screening is recommended by Center for Disease Control  (CDC) for  adults born from [birth year] through 1966.   Completed  . Pap Smear  Discontinued  . HIV Screening  Discontinued       HYPERTENSION INFORMATION  Monitor your blood pressure at home, please keep a record and bring that in with you to your next office visit.   Go to the ER if any CP, SOB, nausea, dizziness, severe HA, changes vision/speech  Testing/Procedures: HOW TO TAKE YOUR BLOOD PRESSURE:  Rest 5 minutes before taking your blood pressure.  Don't smoke or drink caffeinated beverages for at least 30 minutes before.  Take your blood pressure before (not after) you eat.  Sit comfortably with your back supported and both feet on the floor (don't cross your legs).  Elevate your arm to heart level on a table or a desk.  Use the proper sized cuff. It should fit smoothly and snugly around your bare upper arm. There should be enough room to slip a fingertip under the cuff. The bottom edge of the cuff should be 1 inch above the crease of the elbow.  Your most recent BP: BP: (!) 166/70   Take your medications  faithfully as instructed. Maintain a healthy weight. Get at least 150 minutes of aerobic exercise per week. Minimize salt intake. Minimize alcohol intake  DASH Eating Plan DASH stands for "Dietary Approaches to Stop Hypertension." The DASH eating plan is a healthy eating plan that has been shown to reduce high blood pressure (hypertension). Additional health benefits may include reducing the risk of type 2 diabetes mellitus, heart disease, and stroke. The DASH eating plan may also help with weight loss. WHAT DO I NEED TO KNOW ABOUT THE DASH EATING PLAN? For the DASH eating plan, you will follow these general guidelines:  Choose foods with a percent daily value for sodium of less than 5% (as listed on the food label).  Use salt-free seasonings or herbs instead of table salt or sea salt.  Check with your health care provider or pharmacist before using salt substitutes.  Eat lower-sodium products, often labeled as "lower sodium" or "no salt added."  Eat fresh foods.  Eat more vegetables, fruits, and low-fat dairy products.  Choose whole grains. Look for the word "whole" as the first word in the ingredient list.  Choose fish and skinless chicken or [place] more often than red meat. Limit fish, poultry, and meat to 6 oz (170  g) each day.  Limit sweets, desserts, sugars, and sugary drinks.  Choose heart-healthy fats.  Limit cheese to 1 oz (28 g) per day.  Eat more home-cooked food and less restaurant, buffet, and fast food.  Limit fried foods.  Cook foods using methods other than frying.  Limit canned vegetables. If you do use them, rinse them well to decrease the sodium.  When eating at a restaurant, ask that your food be prepared with less salt, or no salt if possible. WHAT FOODS CAN I EAT? Seek help from a dietitian for individual calorie needs. Grains Whole grain or whole wheat bread. Brown rice. Whole grain or whole wheat pasta. Quinoa, bulgur, and whole grain cereals.  Low-sodium cereals. Corn or whole wheat flour tortillas. Whole grain cornbread. Whole grain crackers. Low-sodium crackers. Vegetables Fresh or frozen vegetables (raw, steamed, roasted, or grilled). Low-sodium or reduced-sodium tomato and vegetable juices. Low-sodium or reduced-sodium tomato sauce and paste. Low-sodium or reduced-sodium canned vegetables.  Fruits All fresh, canned (in natural juice), or frozen fruits. Meat and Other Protein Products Ground beef (85% or leaner), grass-fed beef, or beef trimmed of fat. Skinless chicken or [place]. Ground chicken or [place]. Pork trimmed of fat. All fish and seafood. Eggs. Dried beans, peas, or lentils. Unsalted nuts and seeds. Unsalted canned beans. Dairy Low-fat dairy products, such as skim or 1% milk, 2% or reduced-fat cheeses, low-fat ricotta or cottage cheese, or plain low-fat yogurt. Low-sodium or reduced-sodium cheeses. Fats and Oils Tub margarines without trans fats. Light or reduced-fat mayonnaise and salad dressings (reduced sodium). Avocado. Safflower, olive, or canola oils. Natural peanut or almond butter. Other Unsalted popcorn and pretzels. The items listed above may not be a complete list of recommended foods or beverages. Contact your dietitian for more options. WHAT FOODS ARE NOT RECOMMENDED? Grains White bread. White pasta. White rice. Refined cornbread. Bagels and croissants. Crackers that contain trans fat. Vegetables Creamed or fried vegetables. Vegetables in a cheese sauce. Regular canned vegetables. Regular canned tomato sauce and paste. Regular tomato and vegetable juices. Fruits Dried fruits. Canned fruit in light or heavy syrup. Fruit juice. Meat and Other Protein Products Fatty cuts of meat. Ribs, chicken wings, bacon, sausage, bologna, salami, chitterlings, fatback, hot dogs, bratwurst, and packaged luncheon meats. Salted nuts and seeds. Canned beans with salt. Dairy Whole or 2% milk, cream, half-and-half, and cream  cheese. Whole-fat or sweetened yogurt. Full-fat cheeses or blue cheese. Nondairy creamers and whipped toppings. Processed cheese, cheese spreads, or cheese curds. Condiments Onion and garlic salt, seasoned salt, table salt, and sea salt. Canned and packaged gravies. Worcestershire sauce. Tartar sauce. Barbecue sauce. Teriyaki sauce. Soy sauce, including reduced sodium. Steak sauce. Fish sauce. Oyster sauce. Cocktail sauce. Horseradish. Ketchup and mustard. Meat flavorings and tenderizers. Bouillon cubes. Hot sauce. Tabasco sauce. Marinades. Taco seasonings. Relishes. Fats and Oils Butter, stick margarine, lard, shortening, ghee, and bacon fat. Coconut, palm kernel, or palm oils. Regular salad dressings. Other Pickles and olives. Salted popcorn and pretzels. The items listed above may not be a complete list of foods and beverages to avoid. Contact your dietitian for more information. WHERE CAN I FIND MORE INFORMATION? National Heart, Lung, and Blood Institute: [employer] Document Released: 10/07/2011 Document Revised: 03/04/2014 Document Reviewed: 08/22/2013 [facility] Patient Information 2015 [place], [place]. This information is not intended to replace advice given to you by your health care provider. Make sure you discuss any questions you have with your health care provider.

## 2020-09-10 ENCOUNTER — Other Ambulatory Visit: Payer: Self-pay | Admitting: Adult Health

## 2020-09-10 LAB — LIPID PANEL
Cholesterol: 178 mg/dL (ref <200)
HDL: 88 mg/dL (ref >=50)
LDL Cholesterol (Calc): 72 mg/dL (calc)
Non-HDL Cholesterol (Calc): 90 mg/dL (calc) (ref <130)
Total CHOL/HDL Ratio: 2 (calc) (ref <5.0)
Triglycerides: 92 mg/dL (ref <150)

## 2020-09-10 LAB — CBC WITH DIFFERENTIAL/PLATELET
Absolute Monocytes: 419 cells/uL (ref 200–950)
Basophils Absolute: 41 cells/uL (ref 0–200)
Basophils Relative: 0.7 %
Eosinophils Absolute: 218 cells/uL (ref 15–500)
Eosinophils Relative: 3.7 %
HCT: 35.4 % (ref 35.0–45.0)
Hemoglobin: 11.4 g/dL — ABNORMAL LOW (ref 11.7–15.5)
Lymphs Abs: 3912 cells/uL — ABNORMAL HIGH (ref 850–3900)
MCH: 25.6 pg — ABNORMAL LOW (ref 27.0–33.0)
MCHC: 32.2 g/dL (ref 32.0–36.0)
MCV: 79.4 fL — ABNORMAL LOW (ref 80.0–100.0)
MPV: 10.9 fL (ref 7.5–12.5)
Monocytes Relative: 7.1 %
Neutro Abs: 1310 cells/uL — ABNORMAL LOW (ref 1500–7800)
Neutrophils Relative %: 22.2 %
Platelets: 411 10*3/uL — ABNORMAL HIGH (ref 140–400)
RBC: 4.46 10*6/uL (ref 3.80–5.10)
RDW: 14.3 % (ref 11.0–15.0)
Total Lymphocyte: 66.3 %
WBC: 5.9 10*3/uL (ref 3.8–10.8)

## 2020-09-10 LAB — COMPLETE METABOLIC PANEL WITH GFR
AG Ratio: 1.5 (calc) (ref 1.0–2.5)
ALT: 9 U/L (ref 6–29)
AST: 12 U/L (ref 10–35)
Albumin: 4.3 g/dL (ref 3.6–5.1)
Alkaline phosphatase (APISO): 55 U/L (ref 37–153)
BUN/Creatinine Ratio: 29 (calc) — ABNORMAL HIGH (ref 6–22)
BUN: 13 mg/dL (ref 7–25)
CO2: 28 mmol/L (ref 20–32)
Calcium: 9.9 mg/dL (ref 8.6–10.4)
Chloride: 100 mmol/L (ref 98–110)
Creat: 0.45 mg/dL — ABNORMAL LOW (ref 0.50–0.99)
GFR, Est African American: 126 mL/min/{1.73_m2} (ref >=60)
GFR, Est Non African American: 109 mL/min/{1.73_m2} (ref >=60)
Globulin: 2.9 g/dL (calc) (ref 1.9–3.7)
Glucose, Bld: 105 mg/dL — ABNORMAL HIGH (ref 65–99)
Potassium: 3.7 mmol/L (ref 3.5–5.3)
Sodium: 140 mmol/L (ref 135–146)
Total Bilirubin: 0.4 mg/dL (ref 0.2–1.2)
Total Protein: 7.2 g/dL (ref 6.1–8.1)

## 2020-09-10 LAB — IRON,TIBC AND FERRITIN PANEL
%SAT: 13 % (calc) — ABNORMAL LOW (ref 16–45)
Ferritin: 23 ng/mL (ref 16–232)
Iron: 48 ug/dL (ref 45–160)
TIBC: 375 mcg/dL (calc) (ref 250–450)

## 2020-09-10 LAB — VITAMIN D 25 HYDROXY (VIT D DEFICIENCY, FRACTURES): Vit D, 25-Hydroxy: >150 ng/mL — ABNORMAL HIGH (ref 30–100)

## 2020-09-10 LAB — TSH: TSH: 0.39 mIU/L — ABNORMAL LOW (ref 0.40–4.50)

## 2020-09-10 LAB — MAGNESIUM: Magnesium: 1.7 mg/dL (ref 1.5–2.5)

## 2020-09-10 LAB — VITAMIN B12: Vitamin B-12: 493 pg/mL (ref 200–1100)

## 2020-09-15 ENCOUNTER — Other Ambulatory Visit: Payer: Self-pay | Admitting: Adult Health

## 2020-09-17 ENCOUNTER — Other Ambulatory Visit: Payer: Self-pay | Admitting: Adult Health

## 2020-09-17 MED ORDER — PREDNISONE 20 MG PO TABS: ORAL_TABLET | ORAL | 0 refills | Status: DC

## 2020-09-19 ENCOUNTER — Other Ambulatory Visit: Payer: Self-pay

## 2020-09-19 ENCOUNTER — Ambulatory Visit: Payer: BC Managed Care – PPO

## 2020-09-19 VITALS — BP 132/80 | HR 67 | Temp 97.3°F | Wt 180.0 lb

## 2020-09-19 DIAGNOSIS — Z79899 Other long term (current) drug therapy: Secondary | ICD-10-CM

## 2020-09-19 NOTE — Progress Notes (Signed)
Patient reports blood pressure recheck .  Log book given to provider's nurse to give to provider for her review.  Pt states she has been following the provider directions on med or amy life style changes.

## 2020-09-20 LAB — URINALYSIS, ROUTINE W REFLEX MICROSCOPIC
Bilirubin Urine: NEGATIVE
Glucose, UA: NEGATIVE
Hgb urine dipstick: NEGATIVE
Ketones, ur: NEGATIVE
Leukocytes,Ua: NEGATIVE
Nitrite: NEGATIVE
Protein, ur: NEGATIVE
Specific Gravity, Urine: 1.005 (ref 1.001–1.03)
pH: 7 (ref 5.0–8.0)

## 2020-09-20 LAB — MICROALBUMIN / CREATININE URINE RATIO
Creatinine, Urine: 21 mg/dL (ref 20–275)
Microalb Creat Ratio: 24 mcg/mg creat (ref <30)
Microalb, Ur: 0.5 mg/dL

## 2020-09-29 ENCOUNTER — Ambulatory Visit (INDEPENDENT_AMBULATORY_CARE_PROVIDER_SITE_OTHER): Payer: BC Managed Care – PPO

## 2020-09-29 ENCOUNTER — Ambulatory Visit: Payer: BC Managed Care – PPO | Admitting: Podiatry

## 2020-09-29 ENCOUNTER — Other Ambulatory Visit: Payer: Self-pay

## 2020-09-29 DIAGNOSIS — L97511 Non-pressure chronic ulcer of other part of right foot limited to breakdown of skin: Secondary | ICD-10-CM

## 2020-09-29 DIAGNOSIS — R600 Localized edema: Secondary | ICD-10-CM | POA: Diagnosis not present

## 2020-09-29 DIAGNOSIS — H35033 Hypertensive retinopathy, bilateral: Secondary | ICD-10-CM | POA: Diagnosis not present

## 2020-09-29 LAB — HM DIABETES EYE EXAM

## 2020-09-29 NOTE — Patient Instructions (Signed)
Unna Boot Care An Unna boot is a type of bandage (dressing) for the foot and leg. The dressing is a gauze wrap that is soaked with a type of medicine called zinc oxide. The gauze may also include other lotions and medicines that help in wound healing, such as calamine. An Unna boot may be used to treat:  Open sores (ulcers) on the foot, heel, or leg.  Swelling from disorders that affect the veins or lymphatic system (lymphedema).  Skin conditions such as chronic inflammation caused by poor blood flow (stasis dermatitis). The dressing is applied by a health care provider. The gauze is wrapped around your lower extremity in several layers, usually starting at the toes and going upward to the knee. A dry outer wrap goes over the medicated wrap for support and compression.  Before applying the Unna boot, your health care provider will clean your leg and foot and may apply an antibiotic ointment. You may be asked to raise (elevate) your leg for a while to reduce swelling before the boot is applied. The boot will dry and harden after it is applied. The boot may need to be changed or replaced about twice a week. Follow these instructions at home: Boot care  Wear the Unna boot as told by your health care provider.  You may need to wear a slipper or shoe over the boot that is one or two sizes larger than normal.  Check the skin around the boot every day. Tell your health care provider about any concerns.  Do not stick anything inside the boot to scratch your skin. Doing that increases your risk of infection.  Keep your Unna boot clean and dry.  Check every day for signs of infection. Check for: ? Redness, swelling, or pain in your foot or toes. ? Fluid or blood coming from the boot. ? Pus or a bad smell coming from the boot.  Remove the boot and call your health care provider if you have signs of poor blood flow, such as: ? Your toes tingle or become numb. ? Your toes turn cold or turn blue or  pale. ? Your toes are more swollen or painful. ? You are unable to move your toes. Activity  You may walk with the boot once it has dried. Ask your health care provider how much walking is safe for you.  Avoid sitting for a long time without moving. Get up to take short walks as told by your health care provider. This is important to improve blood flow. Bathing  Do not take baths, swim, or use a hot tub until your health care provider approves. Ask your health care provider if you may take showers.  If your health care provider approves a bath or a shower, do not let the Unna boot get wet. ? If you take a shower, cover the boot with a watertight covering. ? If you take a bath, keep your leg with the boot out of the tub. General instructions  Keep your leg elevated above the level of your heart while you are sitting or lying down. This will decrease swelling.  Do not sit with your knee bent for long periods of time.  Take over-the-counter and prescription medicines only as told by your health care provider.  Do not use any products that contain nicotine or tobacco, such as cigarettes, e-cigarettes, and chewing tobacco. These can delay healing. If you need help quitting, ask your health care provider.  Keep all follow-up visits as   told by your health care provider. This is important. Contact a health care provider if:  Your skin feels itchy inside the boot.  You have a burning sensation, a rash, or itchy, red, swollen areas of skin (hives) in the boot area.  You have a fever or chills.  You have any signs of infection, such as: ? New redness, swelling, or pain. ? More fluid or blood coming from the boot. ? Pus or a bad smell coming from the boot.  You have increased numbness or pain in your foot or toes.  You have any changes in skin color on your foot or toes, such as the skin turning blue or pale or developing patchy areas with spots.  Your boot has been damaged or feels  like it is no longer fitting properly. Summary  An Unna boot is a type of bandage (dressing) system for the foot and leg.  The dressing is a gauze wrap that is soaked with a type of medicine (zinc oxide) to treat foot, heel, or leg ulcers, swelling from disorders that affect the veins or lymphatic system (lymphedema), and skin conditions caused by poor blood flow (stasis dermatitis).  This dressing is applied by a health care provider. After it is applied, the boot will dry and harden.  The boot may need to be changed or replaced about twice a week.  Let your health care provider know if you have any signs of poor blood flow or infection. This information is not intended to replace advice given to you by your health care provider. Make sure you discuss any questions you have with your health care provider. Document Revised: 02/06/2019 Document Reviewed: 06/28/2018 Elsevier Patient Education  2020 [employer]  

## 2020-10-01 NOTE — Progress Notes (Signed)
Subjective:   Patient ID: Tanya [name], female   DOB: 59 y.o.   MRN: [id number]   HPI 59 year old female presents the office today for evaluation of swelling to her right foot.  She states that she got a pedicure and she had a small cut to her big toe.  Since then she has swelling.  She was originally placed on antibiotics on August 9 and was not helpful and she was placed on a prednisone Dosepak and still has swelling.  Denies any redness or warmth associate with the area.  The area that she was cutting the big toe appears to be healed and denies any drainage or pus from the area.  She has no systemic concerns including fevers, chills, nausea, vomiting.  She is diabetic and last A1c was 6.0.   Review of Systems  All other systems reviewed and are negative.  Past Medical History:  Diagnosis Date  . Allergy   . Diabetes mellitus   . Family history of adverse reaction to anesthesia    " My Mother would vomit "  . Goiter   . Hyperlipidemia   . Hypertension   . Morbid obesity (HCC) 12/24/2013  . OSA (obstructive sleep apnea)   . Palpitations 08/01/2016  . Sarcoid   . Sarcoidosis   . SBO (small bowel obstruction) (HCC) 07/25/2020  . Vitamin D deficiency     Past Surgical History:  Procedure Laterality Date  . ABDOMINAL HYSTERECTOMY    . achiles tendon     right  . EYE SURGERY     biopsy at [facility]  . LAPAROTOMY N/A 07/27/2020   Procedure: EXPLORATORY LAPAROTOMY, SMALL BOWEL RESECTION;  Surgeon: Berna Bue, MD;  Location: [facility] OR;  Service: General;  Laterality: N/A;  . TONSILLECTOMY       Current Outpatient Medications:  .  aspirin EC 81 MG tablet, Take 81 mg by mouth daily.  , Disp: , Rfl:  .  bisoprolol-hydrochlorothiazide (ZIAC) 10-6.25 MG tablet, TAKE 1 TABLET DAILY FOR BLOOD PRESSURE (Patient taking differently: Take 1 tablet by mouth daily. TAKE 1 TABLET DAILY FOR BLOOD PRESSURE), Disp: 90 tablet, Rfl: 0 .  butalbital-acetaminophen-caffeine (FIORICET) 50-325-40 MG  tablet, Take        1 to 2 tablets    4 x  /day as needed for Pain or Headache, Disp: 30 tablet, Rfl: 0 .  cloNIDine (CATAPRES) 0.2 MG tablet, TAKE 1 TABLET BY MOUTH THREE TIMES A DAY WITH MEALS FOR BLOOD PRESSURE, Disp: 270 tablet, Rfl: 3 .  Cyanocobalamin (VITAMIN B-12 SL), Place 1 tablet under the tongue daily. , Disp: , Rfl:  .  diclofenac sodium (VOLTAREN) 1 % GEL, Apply 2 g topically 4 (four) times daily. (Patient taking differently: Apply 2 g topically 4 (four) times daily as needed (pain). ), Disp: 100 g, Rfl: 3 .  doxycycline (VIBRAMYCIN) 100 MG capsule, Take 1 capsule twice daily for foot skin infection., Disp: 20 capsule, Rfl: 0 .  fluticasone (FLONASE) 50 MCG/ACT nasal spray, Place 2 sprays into both nostrils daily. (Patient taking differently: Place 2 sprays into both nostrils daily as needed for allergies. ), Disp: 16 g, Rfl: 6 .  furosemide (LASIX) 40 MG tablet, Take 1 tablet    2 x /day    for BP  and Fluid Retention / Ankle Swelling (Patient taking differently: Take 40 mg by mouth 2 (two) times daily. Take 1 tablet    2 x /day    for BP  and Fluid Retention /  Ankle Swelling), Disp: 180 tablet, Rfl: 0 .  gabapentin (NEURONTIN) 100 MG capsule, Take 1 capsule 3 times a day for Pain (Patient taking differently: Take 100 mg by mouth 3 (three) times daily as needed (pain). Take 1 capsule 3 times a day for Pain), Disp: 270 capsule, Rfl: 1 .  hydrALAZINE (APRESOLINE) 50 MG tablet, Take        1 tablet       3 x /day       for BP, Disp: 90 tablet, Rfl: 0 .  Magnesium 250 MG TABS, Take 250 mg by mouth daily., Disp: , Rfl:  .  metFORMIN (GLUCOPHAGE-XR) 500 MG 24 hr tablet, Take 2 tablets 2 x /day with Meals for Diabetes (Patient taking differently: Take 1,000 mg by mouth 2 (two) times daily with a meal. Take 2 tablets 2 x /day with Meals for Diabetes), Disp: 360 tablet, Rfl: 3 .  Multiple Vitamins-Minerals (MULTIVITAMIN PO), Take by mouth daily., Disp: , Rfl:  .  olmesartan (BENICAR) 40 MG tablet,  TAKE 1 TABLET DAILY FOR BP & DIABETIC KIDNEY PROTECTION (Patient taking differently: Take 40 mg by mouth daily. Take 1 tablet Daily  for BP  & Diabetic Kidney Protection), Disp: 90 tablet, Rfl: 1 .  predniSONE (DELTASONE) 20 MG tablet, 2 tablets daily for 3 days, 1 tablet daily for 4 days., Disp: 10 tablet, Rfl: 0 .  RESTASIS 0.05 % ophthalmic emulsion, Place 1 drop into both eyes 3 (three) times daily as needed for dry eyes., Disp: , Rfl:  .  rosuvastatin (CRESTOR) 5 MG tablet, Take 1 tablet (5 mg total) by mouth daily., Disp: 13 tablet, Rfl: 3  Allergies  Allergen Reactions  . Amlodipine Swelling  . Naproxen Hives  . Meloxicam & Diet Manage Prod Rash         Objective:  Physical Exam  General: AAO x3, NAD  Dermatological: Small scab present to medial aspect of the right hallux adjacent to the toenail from where there was a small cut present but there is no edema, erythema, drainage or pus or any signs of infection noted.  There is no open lesions otherwise there is no warmth of the foot.  There is no areas of fluctuation or crepitation.  Vascular: Dorsalis Pedis artery and Posterior Tibial artery pedal pulses are 2/4 bilateral with immedate capillary fill time. There is no pain with calf compression, swelling, warmth, erythema.   Neruologic: Grossly intact via light touch bilateral.  Sensation intact with Semmes Weinstein monofilament  Musculoskeletal: Mild swelling to the right foot but there is no erythema or warmth.  No area of pinpoint tenderness.  Gait: Unassisted, Nonantalgic.       Assessment:   Right foot swelling    Plan:  -Treatment options discussed including all alternatives, risks, and complications -Etiology of symptoms were discussed -X-rays obtained and reviewed there is no evidence of acute fracture or stress fracture identified today. -She has noticed antibiotics and does not clinically appear to be infected although concern for the swelling.  An Unna boot  was applied today precautions were advised on when to remove this.  Surgical shoe dispensed.  Elevation.   Vivi Barrack DPM

## 2020-10-02 ENCOUNTER — Other Ambulatory Visit: Payer: Self-pay | Admitting: Adult Health

## 2020-10-02 DIAGNOSIS — R2241 Localized swelling, mass and lump, right lower limb: Secondary | ICD-10-CM

## 2020-10-06 ENCOUNTER — Ambulatory Visit (HOSPITAL_COMMUNITY)
Admission: RE | Admit: 2020-10-06 | Discharge: 2020-10-06 | Disposition: A | Discharge status: Home / Self Care | Payer: BC Managed Care – PPO | Source: Ambulatory Visit | Attending: Cardiology | Admitting: Cardiology

## 2020-10-06 ENCOUNTER — Other Ambulatory Visit: Payer: Self-pay

## 2020-10-06 DIAGNOSIS — R2241 Localized swelling, mass and lump, right lower limb: Secondary | ICD-10-CM | POA: Diagnosis not present

## 2020-10-06 NOTE — Progress Notes (Signed)
============================================================  -   U/S tests are Normal & rule out blood clots in Veins (No DVT or Phlebitis)

## 2020-10-07 ENCOUNTER — Other Ambulatory Visit: Payer: Self-pay | Admitting: Internal Medicine

## 2020-10-07 NOTE — Progress Notes (Signed)
Take the lasix 2 x /day  - Recommend use compression stockings as Morrie Sheldon suggested - can get at [employer] or [employer]   - Elevate legs when sitting  - Cut out all extra salt in diet - esp canned foods

## 2020-10-09 ENCOUNTER — Encounter: Payer: Self-pay | Admitting: Internal Medicine

## 2020-10-09 ENCOUNTER — Other Ambulatory Visit: Payer: Self-pay | Admitting: Internal Medicine

## 2020-10-13 ENCOUNTER — Other Ambulatory Visit: Payer: Self-pay

## 2020-10-13 ENCOUNTER — Ambulatory Visit: Payer: BC Managed Care – PPO | Admitting: Podiatry

## 2020-10-13 DIAGNOSIS — L97511 Non-pressure chronic ulcer of other part of right foot limited to breakdown of skin: Secondary | ICD-10-CM | POA: Diagnosis not present

## 2020-10-13 DIAGNOSIS — R609 Edema, unspecified: Secondary | ICD-10-CM

## 2020-10-13 NOTE — Patient Instructions (Signed)
I have ordered a venous reflux study of your leg. If you do not hear for them about scheduling within the next 1 week, or you have any questions please give [place] a call at [phone].

## 2020-10-19 ENCOUNTER — Other Ambulatory Visit: Payer: Self-pay | Admitting: Adult Health

## 2020-10-19 ENCOUNTER — Other Ambulatory Visit: Payer: Self-pay | Admitting: Internal Medicine

## 2020-10-20 NOTE — Progress Notes (Signed)
Subjective: 59 year old female presents the office today for follow-up evaluation of right foot swelling.  She was able to do so for about 5 days has helped some but she still having swelling to the foot.  She denies any recent injury or fall since I last saw her and she has no other concerns today.  She states that the wound is present on the big toe is healed. Denies any systemic complaints such as fevers, chills, nausea, vomiting. No acute changes since last appointment, and no other complaints at this time.   Objective: AAO x3, NAD DP/PT pulses palpable bilaterally, CRT less than 3 seconds There is still edema present in the foot as well as to the distal leg.  There is no warmth associated with swelling there is no erythema.  There is no areas of fluctuation crepitation.  The area of abrasion on the medial aspect hallux is healed.  There is no signs of infection otherwise.  MMT 5/5. No pain with calf compression, swelling, warmth, erythema  Assessment: Right lower extremity edema  Plan: -All treatment options discussed with the patient including all alternatives, risks, complications.  -Encourage elevation, compression as well as to limit salt intake.  I ordered a venous reflux study for her. -Patient encouraged to call the office with any questions, concerns, change in symptoms.   Vivi Barrack DPM

## 2020-10-23 ENCOUNTER — Other Ambulatory Visit: Payer: Self-pay

## 2020-10-23 ENCOUNTER — Ambulatory Visit (HOSPITAL_COMMUNITY)
Admission: RE | Admit: 2020-10-23 | Discharge: 2020-10-23 | Disposition: A | Discharge status: Home / Self Care | Payer: BC Managed Care – PPO | Source: Ambulatory Visit | Attending: Podiatry | Admitting: Podiatry

## 2020-10-23 DIAGNOSIS — R609 Edema, unspecified: Secondary | ICD-10-CM

## 2020-11-10 ENCOUNTER — Other Ambulatory Visit: Payer: Self-pay | Admitting: Adult Health

## 2020-11-10 ENCOUNTER — Other Ambulatory Visit: Payer: Self-pay | Admitting: Internal Medicine

## 2020-11-10 DIAGNOSIS — G8929 Other chronic pain: Secondary | ICD-10-CM

## 2020-11-10 DIAGNOSIS — M545 Low back pain, unspecified: Secondary | ICD-10-CM

## 2020-11-10 DIAGNOSIS — G5711 Meralgia paresthetica, right lower limb: Secondary | ICD-10-CM

## 2020-11-18 ENCOUNTER — Other Ambulatory Visit: Payer: Self-pay | Admitting: Internal Medicine

## 2020-12-01 ENCOUNTER — Other Ambulatory Visit: Payer: Self-pay | Admitting: Internal Medicine

## 2020-12-01 DIAGNOSIS — Z1231 Encounter for screening mammogram for malignant neoplasm of breast: Secondary | ICD-10-CM

## 2020-12-18 ENCOUNTER — Ambulatory Visit: Payer: BC Managed Care – PPO | Admitting: Adult Health

## 2020-12-25 ENCOUNTER — Other Ambulatory Visit: Payer: Self-pay

## 2020-12-25 MED ORDER — HYDRALAZINE HCL 25 MG PO TABS: 25.0000 mg | ORAL_TABLET | Freq: Three times a day (TID) | ORAL | 1 refills | Status: DC

## 2020-12-25 NOTE — Telephone Encounter (Signed)
REFILL ON hydralazine

## 2020-12-29 NOTE — Progress Notes (Deleted)
FOLLOW UP  Assessment and Plan:   Tortuous aorta Per CXR 07/2016 Control blood pressure, cholesterol, glucose, increase exercise.   Hypertension Borderline elevated today; she is not checking at home, monitor more closely Improved on olmesartan 40 mg daily, continue other medicaitons Monitor blood pressure at home; patient to call if consistently greater than 130/80 Continue DASH diet.   Reminder to go to the ER if any CP, SOB, nausea, dizziness, severe HA, changes vision/speech, left arm numbness and tingling and jaw pain.  Cholesterol Fairly managed by lifestyle without medication though not quite at diabetic goal of LDL <70, have deferred medication per strong patient preference Continue low cholesterol diet and exercise.  Check lipid panel.   Diabetes without complications Continue medication: metformin 2000 mg daily  Continue diet and exercise.  Perform daily foot/skin check, notify office of any concerning changes.  Check A1C  Obesity with co morbidities - OSA, T2DM, hyperlipidemia, htn Commended excellent progress Long discussion about weight loss, diet, and exercise Recommended diet heavy in fruits and veggies and low in animal meats, cheeses, and dairy products, appropriate calorie intake Discussed ideal weight for height -  Patient will - continue exercise, low carb, watch portions, increase fluids, weighing once weekly Patient on phentermine with benefit and no SE, taking drug breaks; continue close follow up.  Will follow up in 3 months  Goiter Neg biopsy of nodule in 2012; stable on follow up US 12/2018 Check TSH Monitor   Vitamin D Def/ osteoporosis prevention Continue supplementation; above goal last visit - did stop for 1 month, reduced from twice a week to once weekly  Check Vit D level   Continue diet and meds as discussed. Further disposition pending results of labs. Discussed med's effects and SE's.   Over 30 minutes of exam, counseling, chart review,  and critical decision making was performed.   Future Appointments  Date Time Provider Department Center  12/31/2020 11:30 AM Judd Gaudier, NP [facility] None  01/14/2021  8:20 AM [facility]  09/10/2021 10:00 AM Judd Gaudier, NP [facility] None    ----------------------------------------------------------------------------------------------------------------------  HPI 60 y.o. female  presents for 3 month follow up on hypertension, cholesterol, T2DM, morbid obesity and vitamin D deficiency.   She has OSA, previously on CPAP, reports she stopped taking, now trying to lose weight and plans to get rechecked after. Husband hasn't noted any snoring since weight loss, patient denies AM fatigue or HA. 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  This year she had SBO and had resection in 07/2020, has done well since.  She has had unexplained RLE edema in recent months; had negative DVT and venous reflux studies 10/2020; unremarkable xrays by podiatry ***  BMI is There is no height or weight on file to calculate BMI., she has been walking 3 miles/day in a group (60 min). She has recently started cutting back on carbs and increasing protein. She has been pushing water, aiming for 65 fluid ounces daily. She is avoiding fried foods and cutting down on red meat and carbs, pushing veggies. She is taking phentermine, 1/2 tab a few days a week and doing well with this, denies side effects. Weight is down from 236 lb in 11/2018.  Goal is to get off of some medications.  Wt Readings from Last 3 Encounters:  09/19/20 180 lb (81.6 kg)  09/09/20 180 lb 9.6 oz (81.9 kg)  08/21/20 181 lb (82.1 kg)   She has known  tortuous aorta per CXR 07/2016 Has isolated event of palpitations in 2017, saw cardiology who ordered ECHO due to evidence of sarcoidosis which was normal, no recurrent episodes. She admits to not checking BP at home, does have new BP machine, today  their BP is    She does currently work out.  She denies chest pain, shortness of breath, dizziness.   She is not on cholesterol medication despite recommendation due to strong patient preference. Her cholesterol is not at goal of LDL <70. The cholesterol last visit was:   Lab Results  Component Value Date   CHOL 178 09/09/2020   HDL 88 09/09/2020   LDLCALC 72 09/09/2020   TRIG 92 09/09/2020   CHOLHDL 2.0 09/09/2020    She has been working on diet and exercise for T2 diabetes on metformin 1000 mg BID, and denies increased appetite, nausea, paresthesia of the feet, polydipsia, polyuria, visual disturbances and vomitingHas glucometer but not checking since weight loss and consistent good control. Last A1C in the office was:  Lab Results  Component Value Date   HGBA1C 6.3 (H) 07/23/2020   Lab Results  Component Value Date   GFRAA 126 09/09/2020   She has non-neoplastic goiter with R lobe nodule (neg biopsy 2012) that we are monitoring, thyroid US in 12/2018 showed nodule unchanged from previous:     Lab Results  Component Value Date   TSH 0.39 (L) 09/09/2020   Patient is on Vitamin D supplement and above goal at the last check:  Lab Results  Component Value Date   VD25OH >150 (H) 09/09/2020    She reports she did stop taking for 1 month as instructed, then started 4000 IU daily *** instead of 60109 weekly   Current Medications:  Current Outpatient Medications on File Prior to Visit  Medication Sig  . aspirin EC 81 MG tablet Take 81 mg by mouth daily.    . bisoprolol-hydrochlorothiazide (ZIAC) 10-6.25 MG tablet TAKE 1 TABLET DAILY FOR BLOOD PRESSURE  . butalbital-acetaminophen-caffeine (FIORICET) 50-325-40 MG tablet Take        1 to 2 tablets    4 x  /day as needed for Pain or Headache  . cloNIDine (CATAPRES) 0.2 MG tablet TAKE 1 TABLET BY MOUTH THREE TIMES A DAY WITH MEALS FOR BLOOD PRESSURE  . Cyanocobalamin (VITAMIN B-12 SL) Place 1 tablet under the tongue daily.   . diclofenac  sodium (VOLTAREN) 1 % GEL Apply 2 g topically 4 (four) times daily. (Patient taking differently: Apply 2 g topically 4 (four) times daily as needed (pain). )  . doxycycline (VIBRAMYCIN) 100 MG capsule Take 1 capsule twice daily for foot skin infection.  . fluticasone (FLONASE) 50 MCG/ACT nasal spray Place 2 sprays into both nostrils daily. (Patient taking differently: Place 2 sprays into both nostrils daily as needed for allergies. )  . furosemide (LASIX) 40 MG tablet Take     1 tablet 2 x /day for BP & Fluid Retention / Ankle Swelling    TAKE 1 TABLET TWO TIMES A DAY FOR BLOOD PRESSURE AND FLUID RETENTION / ANKLE SWELLING  . gabapentin (NEURONTIN) 100 MG capsule Take     1 capsule    3 x /day for Chronic Pain             TAKE 1 CAPSULE BY MOUTH 3 TIMES A DAY FOR PAIN  . hydrALAZINE (APRESOLINE) 25 MG tablet Take 1 tablet (25 mg total) by mouth 3 (three) times daily.  . Magnesium 250 MG  TABS Take 250 mg by mouth daily.  . metFORMIN (GLUCOPHAGE-XR) 500 MG 24 hr tablet TAKE 2 TABLETS TWICE X DAY WITH MEALS FOR DIABETES  . Multiple Vitamins-Minerals (MULTIVITAMIN PO) Take by mouth daily.  Marland Kitchen olmesartan (BENICAR) 40 MG tablet TAKE 1 TABLET DAILY FOR BP & DIABETIC KIDNEY PROTECTION  . predniSONE (DELTASONE) 20 MG tablet 2 tablets daily for 3 days, 1 tablet daily for 4 days.  . RESTASIS 0.05 % ophthalmic emulsion Place 1 drop into both eyes 3 (three) times daily as needed for dry eyes.  . rosuvastatin (CRESTOR) 5 MG tablet Take 1 tablet (5 mg total) by mouth daily.  . Vitamin D, Ergocalciferol, (DRISDOL) 1.25 MG (50000 UNIT) CAPS capsule Take 1 capsule (50,000 Units total) by mouth every 7 (seven) days. Take 1 capsule x /week for Severe Vitamin D Deficiency (Tuesday)   No current facility-administered medications on file prior to visit.     Allergies:  Allergies  Allergen Reactions  . Amlodipine Swelling  . Naproxen Hives  . Meloxicam & Diet Manage Prod Rash     Medical History:  Past Medical  History:  Diagnosis Date  . Allergy   . Diabetes mellitus   . Family history of adverse reaction to anesthesia    " My Mother would vomit "  . Goiter   . Hyperlipidemia   . Hypertension   . Morbid obesity (HCC) 12/24/2013  . OSA (obstructive sleep apnea)   . Palpitations 08/01/2016  . Sarcoid   . Sarcoidosis   . SBO (small bowel obstruction) (HCC) 07/25/2020  . Vitamin D deficiency    Family history- Reviewed and unchanged Social history- Reviewed and unchanged   Review of Systems:  Review of Systems  Constitutional: Negative for malaise/fatigue and weight loss.  HENT: Negative for hearing loss and tinnitus.   Eyes: Negative for blurred vision and double vision.  Respiratory: Negative for cough, shortness of breath and wheezing.   Cardiovascular: Negative for chest pain, palpitations, orthopnea, claudication and leg swelling.  Gastrointestinal: Negative for abdominal pain, blood in stool, constipation, diarrhea, heartburn, melena, nausea and vomiting.  Genitourinary: Negative.   Musculoskeletal: Negative for back pain, joint pain and myalgias.  Skin: Negative for rash.  Neurological: Negative for dizziness, tingling, sensory change, weakness and headaches.  Endo/Heme/Allergies: Negative for polydipsia.  Psychiatric/Behavioral: Negative.   All other systems reviewed and are negative.   Physical Exam: There were no vitals taken for this visit. Wt Readings from Last 3 Encounters:  09/19/20 180 lb (81.6 kg)  09/09/20 180 lb 9.6 oz (81.9 kg)  08/21/20 181 lb (82.1 kg)   General Appearance: Well nourished, in no apparent distress. Eyes: PERRLA, EOMs, conjunctiva no swelling or erythema Sinuses: No Frontal/maxillary tenderness ENT/Mouth: Ext aud canals clear, TMs without erythema, bulging. No erythema, swelling, or exudate on post pharynx.  Tonsils not swollen or erythematous. Hearing normal.  Neck: Supple, thyroid normal.  Respiratory: Respiratory effort normal, BS equal  bilaterally without rales, rhonchi, wheezing or stridor.  Cardio: RRR with no MRGs. Brisk peripheral pulses without edema. *** Abdomen: Soft, + BS.  Non tender, no guarding, rebound, hernias, masses. Lymphatics: Non tender without lymphadenopathy.  Musculoskeletal: Full ROM, 5/5 strength, Normal gait.  Skin: Warm, dry without rashes, lesions, ecchymosis.  Neuro: Cranial nerves intact. No cerebellar symptoms.  Psych: Awake and oriented X 3, normal affect, Insight and Judgment appropriate.    Dan Maker, NP 9:30 AM [facility]

## 2020-12-31 ENCOUNTER — Ambulatory Visit: Payer: BC Managed Care – PPO | Admitting: Adult Health

## 2020-12-31 DIAGNOSIS — I10 Essential (primary) hypertension: Secondary | ICD-10-CM

## 2020-12-31 DIAGNOSIS — I771 Stricture of artery: Secondary | ICD-10-CM

## 2020-12-31 DIAGNOSIS — E662 Morbid (severe) obesity with alveolar hypoventilation: Secondary | ICD-10-CM

## 2020-12-31 DIAGNOSIS — E669 Obesity, unspecified: Secondary | ICD-10-CM

## 2020-12-31 DIAGNOSIS — E1169 Type 2 diabetes mellitus with other specified complication: Secondary | ICD-10-CM

## 2020-12-31 DIAGNOSIS — E559 Vitamin D deficiency, unspecified: Secondary | ICD-10-CM

## 2020-12-31 DIAGNOSIS — E049 Nontoxic goiter, unspecified: Secondary | ICD-10-CM

## 2021-01-07 DIAGNOSIS — H40023 Open angle with borderline findings, high risk, bilateral: Secondary | ICD-10-CM | POA: Diagnosis not present

## 2021-01-13 NOTE — Progress Notes (Signed)
FOLLOW UP  Assessment and Plan:   Tortuous aorta Per CXR 07/2016 Control blood pressure, cholesterol, glucose, increase exercise.   Hypertension Improved on olmesartan 40 mg daily, continue other medicaitons Monitor blood pressure at home; patient to call if consistently greater than 130/80 Continue DASH diet.   Reminder to go to the ER if any CP, SOB, nausea, dizziness, severe HA, changes vision/speech, left arm numbness and tingling and jaw pain.  Cholesterol Fairly managed by lifestyle and low dose low frequency statin per strong patient preference  Continue low cholesterol diet and exercise.  Check lipid panel.   Diabetes without complications Continue medication: metformin 2000 mg daily  Continue diet and exercise.  Perform daily foot/skin check, notify office of any concerning changes.  Check A1C  Obesity with co morbidities -BMI 30+ with OSA, T2DM, hyperlipidemia, htn Commended excellent progress Long discussion about weight loss, diet, and exercise Recommended diet heavy in fruits and veggies and low in animal meats, cheeses, and dairy products, appropriate calorie intake Discussed ideal weight for height -  Patient will - continue exercise, low carb, watch portions, increase fluids, weighing once weekly Patient on phentermine with benefit and no SE, taking drug breaks; continue close follow up.  Will follow up in 3 months  Goiter Neg biopsy of nodule in 2012; stable on follow up US 12/2018 Check TSH Monitor   Vitamin D Def/ osteoporosis prevention Continue supplementation; above goal last visit - did stop for 1 month, reduced from twice a week to once weekly  Check Vit D level  OSA Needs new machine, repeat study; will refer to Dr. Vickey Huger   Continue diet and meds as discussed. Further disposition pending results of labs. Discussed med's effects and SE's.   Over 30 minutes of exam, counseling, chart review, and critical decision making was performed.    Future Appointments  Date Time Provider Department Center  09/10/2021 10:00 AM Judd Gaudier, NP [facility] None    ----------------------------------------------------------------------------------------------------------------------  HPI 60 y.o. female  presents for 3 month follow up on hypertension, cholesterol, T2DM, morbid obesity and vitamin D deficiency.   She has hx of OSA, previously on CPAP but needs new machine, last sleep study remote and not in system, unsure where she got this done. Requesting referral for follow up and new machine if needed.   Last year year she had SBO and had resection in 07/2020, has done well since.  She has had unexplained RLE edema in recent months; had negative DVT and venous reflux studies 10/2020; unremarkable xrays by podiatry; she reports has been walking more, compression and elevating and improved.   BMI is Body mass index is 32.26 kg/m., she has been walking 3 miles/day in a group (60 min). She has recently started cutting back on carbs (potatoes and rice), more veggies. She is avoiding fried foods and cutting down on red meat. Weight is down from 236 lb in 11/2018.  Goal is to get off of some medications. She uses phentermine intermittently, 1/2 tab only with benefit, hasn't taken recently but plans to restart. Denies SE.  Wt Readings from Last 3 Encounters:  01/14/21 185 lb (83.9 kg)  09/19/20 180 lb (81.6 kg)  09/09/20 180 lb 9.6 oz (81.9 kg)   She has known tortuous aorta per CXR 07/2016  She does check BPs at home (120-130s/70s), today their BP is BP: 120/70  She does currently work out.  She denies chest pain, shortness of breath, dizziness.   She is on cholesterol medication (rosuvatatin  5 mg once weekly). Her cholesterol is not at goal of LDL <70. The cholesterol last visit was:   Lab Results  Component Value Date   CHOL 178 09/09/2020   HDL 88 09/09/2020   LDLCALC 72 09/09/2020   TRIG 92 09/09/2020   CHOLHDL 2.0 09/09/2020     She has been working on diet and exercise for T2 diabetes on metformin 1000 mg BID, and denies increased appetite, nausea, paresthesia of the feet, polydipsia, polyuria, visual disturbances and vomiting  Reports fasting 89-120.  Last A1C in the office was:  Lab Results  Component Value Date   HGBA1C 6.3 (H) 07/23/2020   Lab Results  Component Value Date   GFRAA 126 09/09/2020   She has non-neoplastic goiter with R lobe nodule (neg biopsy 2012) that we are monitoring, thyroid US in 12/2018 showed nodule unchanged from previous:     Lab Results  Component Value Date   TSH 0.39 (L) 09/09/2020   Patient is on Vitamin D supplement and above goal at the last check:  Lab Results  Component Value Date   VD25OH >150 (H) 09/09/2020   She reports she did stop taking 65784 IU weekly for 1 month as instructed, then started 4000 IU daily.   Current Medications:  Current Outpatient Medications on File Prior to Visit  Medication Sig  . aspirin EC 81 MG tablet Take 81 mg by mouth daily.  . bisoprolol-hydrochlorothiazide (ZIAC) 10-6.25 MG tablet TAKE 1 TABLET DAILY FOR BLOOD PRESSURE  . butalbital-acetaminophen-caffeine (FIORICET) 50-325-40 MG tablet Take        1 to 2 tablets    4 x  /day as needed for Pain or Headache  . cloNIDine (CATAPRES) 0.2 MG tablet TAKE 1 TABLET BY MOUTH THREE TIMES A DAY WITH MEALS FOR BLOOD PRESSURE  . Cyanocobalamin (VITAMIN B-12 SL) Place 1 tablet under the tongue daily.   . diclofenac sodium (VOLTAREN) 1 % GEL Apply 2 g topically 4 (four) times daily. (Patient taking differently: Apply 2 g topically 4 (four) times daily as needed (pain).)  . fluticasone (FLONASE) 50 MCG/ACT nasal spray Place 2 sprays into both nostrils daily. (Patient taking differently: Place 2 sprays into both nostrils daily as needed for allergies.)  . furosemide (LASIX) 40 MG tablet Take     1 tablet 2 x /day for BP & Fluid Retention / Ankle Swelling    TAKE 1 TABLET TWO TIMES A DAY FOR BLOOD  PRESSURE AND FLUID RETENTION / ANKLE SWELLING  . gabapentin (NEURONTIN) 100 MG capsule Take     1 capsule    3 x /day for Chronic Pain             TAKE 1 CAPSULE BY MOUTH 3 TIMES A DAY FOR PAIN  . hydrALAZINE (APRESOLINE) 25 MG tablet Take 1 tablet (25 mg total) by mouth 3 (three) times daily.  . Magnesium 250 MG TABS Take 250 mg by mouth daily.  . metFORMIN (GLUCOPHAGE-XR) 500 MG 24 hr tablet TAKE 2 TABLETS TWICE X DAY WITH MEALS FOR DIABETES  . Multiple Vitamins-Minerals (MULTIVITAMIN PO) Take by mouth daily.  Marland Kitchen olmesartan (BENICAR) 40 MG tablet TAKE 1 TABLET DAILY FOR BP & DIABETIC KIDNEY PROTECTION  . RESTASIS 0.05 % ophthalmic emulsion Place 1 drop into both eyes 3 (three) times daily as needed for dry eyes.  . rosuvastatin (CRESTOR) 5 MG tablet Take 1 tablet (5 mg total) by mouth daily.  . Vitamin D, Ergocalciferol, (DRISDOL) 1.25 MG (50000 UNIT) CAPS  capsule Take 1 capsule (50,000 Units total) by mouth every 7 (seven) days. Take 1 capsule x /week for Severe Vitamin D Deficiency (Tuesday)   No current facility-administered medications on file prior to visit.     Allergies:  Allergies  Allergen Reactions  . Amlodipine Swelling  . Naproxen Hives  . Meloxicam & Diet Manage Prod Rash     Medical History:  Past Medical History:  Diagnosis Date  . Allergy   . Diabetes mellitus   . Family history of adverse reaction to anesthesia    " My Mother would vomit "  . Goiter   . Hyperlipidemia   . Hypertension   . Morbid obesity (HCC) 12/24/2013  . OSA (obstructive sleep apnea)   . Palpitations 08/01/2016  . Sarcoid   . Sarcoidosis   . SBO (small bowel obstruction) (HCC) 07/25/2020  . Vitamin D deficiency    Family history- Reviewed and unchanged Social history- Reviewed and unchanged   Review of Systems:  Review of Systems  Constitutional: Negative for malaise/fatigue and weight loss.  HENT: Negative for hearing loss and tinnitus.   Eyes: Negative for blurred vision and double  vision.  Respiratory: Negative for cough, shortness of breath and wheezing.   Cardiovascular: Negative for chest pain, palpitations, orthopnea, claudication and leg swelling.  Gastrointestinal: Negative for abdominal pain, blood in stool, constipation, diarrhea, heartburn, melena, nausea and vomiting.  Genitourinary: Negative.   Musculoskeletal: Negative for back pain, joint pain and myalgias.  Skin: Negative for rash.  Neurological: Negative for dizziness, tingling, sensory change, weakness and headaches.  Endo/Heme/Allergies: Negative for polydipsia.  Psychiatric/Behavioral: Negative.   All other systems reviewed and are negative.   Physical Exam: BP 120/70   Pulse (!) 48   Temp (!) 97.2 F (36.2 C)   Wt 185 lb (83.9 kg)   SpO2 98%   BMI 32.26 kg/m  Wt Readings from Last 3 Encounters:  01/14/21 185 lb (83.9 kg)  09/19/20 180 lb (81.6 kg)  09/09/20 180 lb 9.6 oz (81.9 kg)   General Appearance: Well nourished, in no apparent distress. Eyes: PERRLA, EOMs, conjunctiva no swelling or erythema Sinuses: No Frontal/maxillary tenderness ENT/Mouth: Ext aud canals clear, TMs without erythema, bulging. No erythema, swelling, or exudate on post pharynx.  Tonsils not swollen or erythematous. Hearing normal.  Neck: Supple, thyroid normal.  Respiratory: Respiratory effort normal, BS equal bilaterally without rales, rhonchi, wheezing or stridor.  Cardio: RRR with no MRGs. Brisk peripheral pulses with scant non-pitting edema R ankle/pedal.  Abdomen: Soft, + BS.  Non tender, no guarding, rebound, hernias, masses. Lymphatics: Non tender without lymphadenopathy.  Musculoskeletal: Full ROM, 5/5 strength, Normal gait.  Skin: Warm, dry without rashes, lesions, ecchymosis.  Neuro: Cranial nerves intact. No cerebellar symptoms.  Psych: Awake and oriented X 3, normal affect, Insight and Judgment appropriate.    Dan Maker, NP 2:41 PM [facility]

## 2021-01-14 ENCOUNTER — Encounter: Payer: Self-pay | Admitting: Adult Health

## 2021-01-14 ENCOUNTER — Other Ambulatory Visit: Payer: Self-pay

## 2021-01-14 ENCOUNTER — Ambulatory Visit (INDEPENDENT_AMBULATORY_CARE_PROVIDER_SITE_OTHER): Payer: BC Managed Care – PPO | Admitting: Adult Health

## 2021-01-14 ENCOUNTER — Ambulatory Visit
Admission: RE | Admit: 2021-01-14 | Discharge: 2021-01-14 | Disposition: A | Discharge status: Home / Self Care | Payer: BC Managed Care – PPO | Source: Ambulatory Visit | Attending: Internal Medicine | Admitting: Internal Medicine

## 2021-01-14 VITALS — BP 120/70 | HR 48 | Temp 97.2°F | Wt 185.0 lb

## 2021-01-14 DIAGNOSIS — E1169 Type 2 diabetes mellitus with other specified complication: Secondary | ICD-10-CM | POA: Diagnosis not present

## 2021-01-14 DIAGNOSIS — E669 Obesity, unspecified: Secondary | ICD-10-CM

## 2021-01-14 DIAGNOSIS — I771 Stricture of artery: Secondary | ICD-10-CM

## 2021-01-14 DIAGNOSIS — E559 Vitamin D deficiency, unspecified: Secondary | ICD-10-CM | POA: Diagnosis not present

## 2021-01-14 DIAGNOSIS — Z1231 Encounter for screening mammogram for malignant neoplasm of breast: Secondary | ICD-10-CM

## 2021-01-14 DIAGNOSIS — E785 Hyperlipidemia, unspecified: Secondary | ICD-10-CM | POA: Diagnosis not present

## 2021-01-14 DIAGNOSIS — I10 Essential (primary) hypertension: Secondary | ICD-10-CM

## 2021-01-14 DIAGNOSIS — E049 Nontoxic goiter, unspecified: Secondary | ICD-10-CM

## 2021-01-14 DIAGNOSIS — Z79899 Other long term (current) drug therapy: Secondary | ICD-10-CM | POA: Diagnosis not present

## 2021-01-14 DIAGNOSIS — G4733 Obstructive sleep apnea (adult) (pediatric): Secondary | ICD-10-CM

## 2021-01-14 DIAGNOSIS — E662 Morbid (severe) obesity with alveolar hypoventilation: Secondary | ICD-10-CM

## 2021-01-14 MED ORDER — PHENTERMINE HCL 37.5 MG PO TABS: ORAL_TABLET | ORAL | 2 refills | Status: DC

## 2021-01-15 LAB — COMPLETE METABOLIC PANEL WITH GFR
AG Ratio: 1.5 (calc) (ref 1.0–2.5)
ALT: 9 U/L (ref 6–29)
AST: 14 U/L (ref 10–35)
Albumin: 4.3 g/dL (ref 3.6–5.1)
Alkaline phosphatase (APISO): 54 U/L (ref 37–153)
BUN: 18 mg/dL (ref 7–25)
CO2: 33 mmol/L — ABNORMAL HIGH (ref 20–32)
Calcium: 9.8 mg/dL (ref 8.6–10.4)
Chloride: 98 mmol/L (ref 98–110)
Creat: 0.52 mg/dL (ref 0.50–0.99)
GFR, Est African American: 120 mL/min/{1.73_m2} (ref >=60)
GFR, Est Non African American: 104 mL/min/{1.73_m2} (ref >=60)
Globulin: 2.9 g/dL (calc) (ref 1.9–3.7)
Glucose, Bld: 104 mg/dL — ABNORMAL HIGH (ref 65–99)
Potassium: 4.1 mmol/L (ref 3.5–5.3)
Sodium: 138 mmol/L (ref 135–146)
Total Bilirubin: 0.6 mg/dL (ref 0.2–1.2)
Total Protein: 7.2 g/dL (ref 6.1–8.1)

## 2021-01-15 LAB — CBC WITH DIFFERENTIAL/PLATELET
Absolute Monocytes: 401 cells/uL (ref 200–950)
Basophils Absolute: 41 cells/uL (ref 0–200)
Basophils Relative: 0.7 %
Eosinophils Absolute: 71 cells/uL (ref 15–500)
Eosinophils Relative: 1.2 %
HCT: 36.4 % (ref 35.0–45.0)
Hemoglobin: 11.3 g/dL — ABNORMAL LOW (ref 11.7–15.5)
Lymphs Abs: 3416 cells/uL (ref 850–3900)
MCH: 24.8 pg — ABNORMAL LOW (ref 27.0–33.0)
MCHC: 31 g/dL — ABNORMAL LOW (ref 32.0–36.0)
MCV: 80 fL (ref 80.0–100.0)
MPV: 11 fL (ref 7.5–12.5)
Monocytes Relative: 6.8 %
Neutro Abs: 1971 cells/uL (ref 1500–7800)
Neutrophils Relative %: 33.4 %
Platelets: 395 10*3/uL (ref 140–400)
RBC: 4.55 10*6/uL (ref 3.80–5.10)
RDW: 14.5 % (ref 11.0–15.0)
Total Lymphocyte: 57.9 %
WBC: 5.9 10*3/uL (ref 3.8–10.8)

## 2021-01-15 LAB — HEMOGLOBIN A1C
Hgb A1c MFr Bld: 6.2 % of total Hgb — ABNORMAL HIGH (ref <5.7)
Mean Plasma Glucose: 131 mg/dL
eAG (mmol/L): 7.3 mmol/L

## 2021-01-15 LAB — LIPID PANEL
Cholesterol: 177 mg/dL (ref <200)
HDL: 107 mg/dL (ref >=50)
LDL Cholesterol (Calc): 55 mg/dL (calc)
Non-HDL Cholesterol (Calc): 70 mg/dL (calc) (ref <130)
Total CHOL/HDL Ratio: 1.7 (calc) (ref <5.0)
Triglycerides: 70 mg/dL (ref <150)

## 2021-01-15 LAB — VITAMIN D 25 HYDROXY (VIT D DEFICIENCY, FRACTURES): Vit D, 25-Hydroxy: 90 ng/mL (ref 30–100)

## 2021-01-15 LAB — MAGNESIUM: Magnesium: 2 mg/dL (ref 1.5–2.5)

## 2021-01-15 LAB — TSH: TSH: 0.91 mIU/L (ref 0.40–4.50)

## 2021-03-05 ENCOUNTER — Other Ambulatory Visit: Payer: Self-pay | Admitting: Adult Health

## 2021-03-05 DIAGNOSIS — E785 Hyperlipidemia, unspecified: Secondary | ICD-10-CM

## 2021-03-13 ENCOUNTER — Other Ambulatory Visit: Payer: Self-pay | Admitting: Adult Health

## 2021-03-13 DIAGNOSIS — I10 Essential (primary) hypertension: Secondary | ICD-10-CM

## 2021-03-13 MED ORDER — CLONIDINE HCL 0.2 MG PO TABS: ORAL_TABLET | ORAL | 3 refills | Status: DC

## 2021-03-21 ENCOUNTER — Other Ambulatory Visit: Payer: Self-pay | Admitting: Adult Health

## 2021-05-29 ENCOUNTER — Other Ambulatory Visit: Payer: Self-pay | Admitting: Adult Health

## 2021-08-06 ENCOUNTER — Other Ambulatory Visit: Payer: Self-pay | Admitting: Adult Health

## 2021-08-19 IMAGING — DX DG ABD PORTABLE 1V
2 series · 2 of 2 positions shown · non-contrast
Comparison: 07/22/2020

CLINICAL DATA: NG tube placement

EXAM:
PORTABLE ABDOMEN - 1 VIEW

[abdomen kub (1 of 2)]
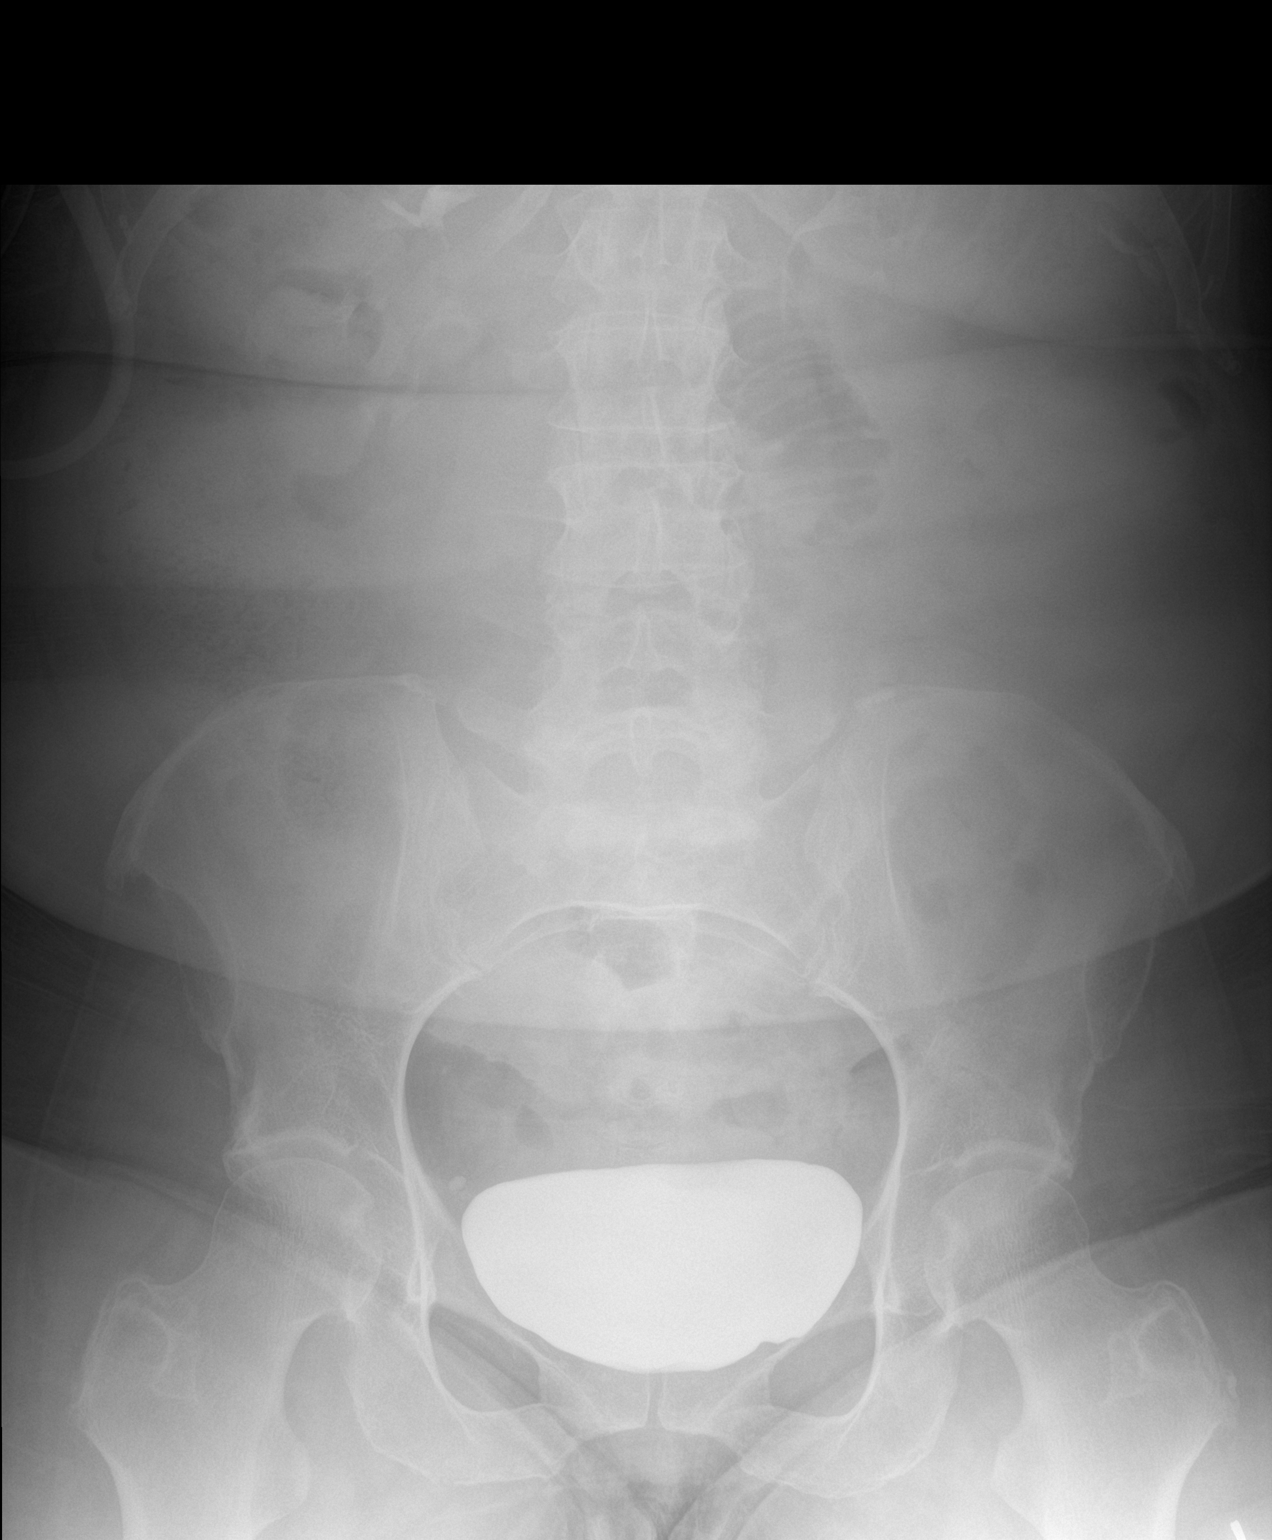

[abdomen kub (2 of 2)]
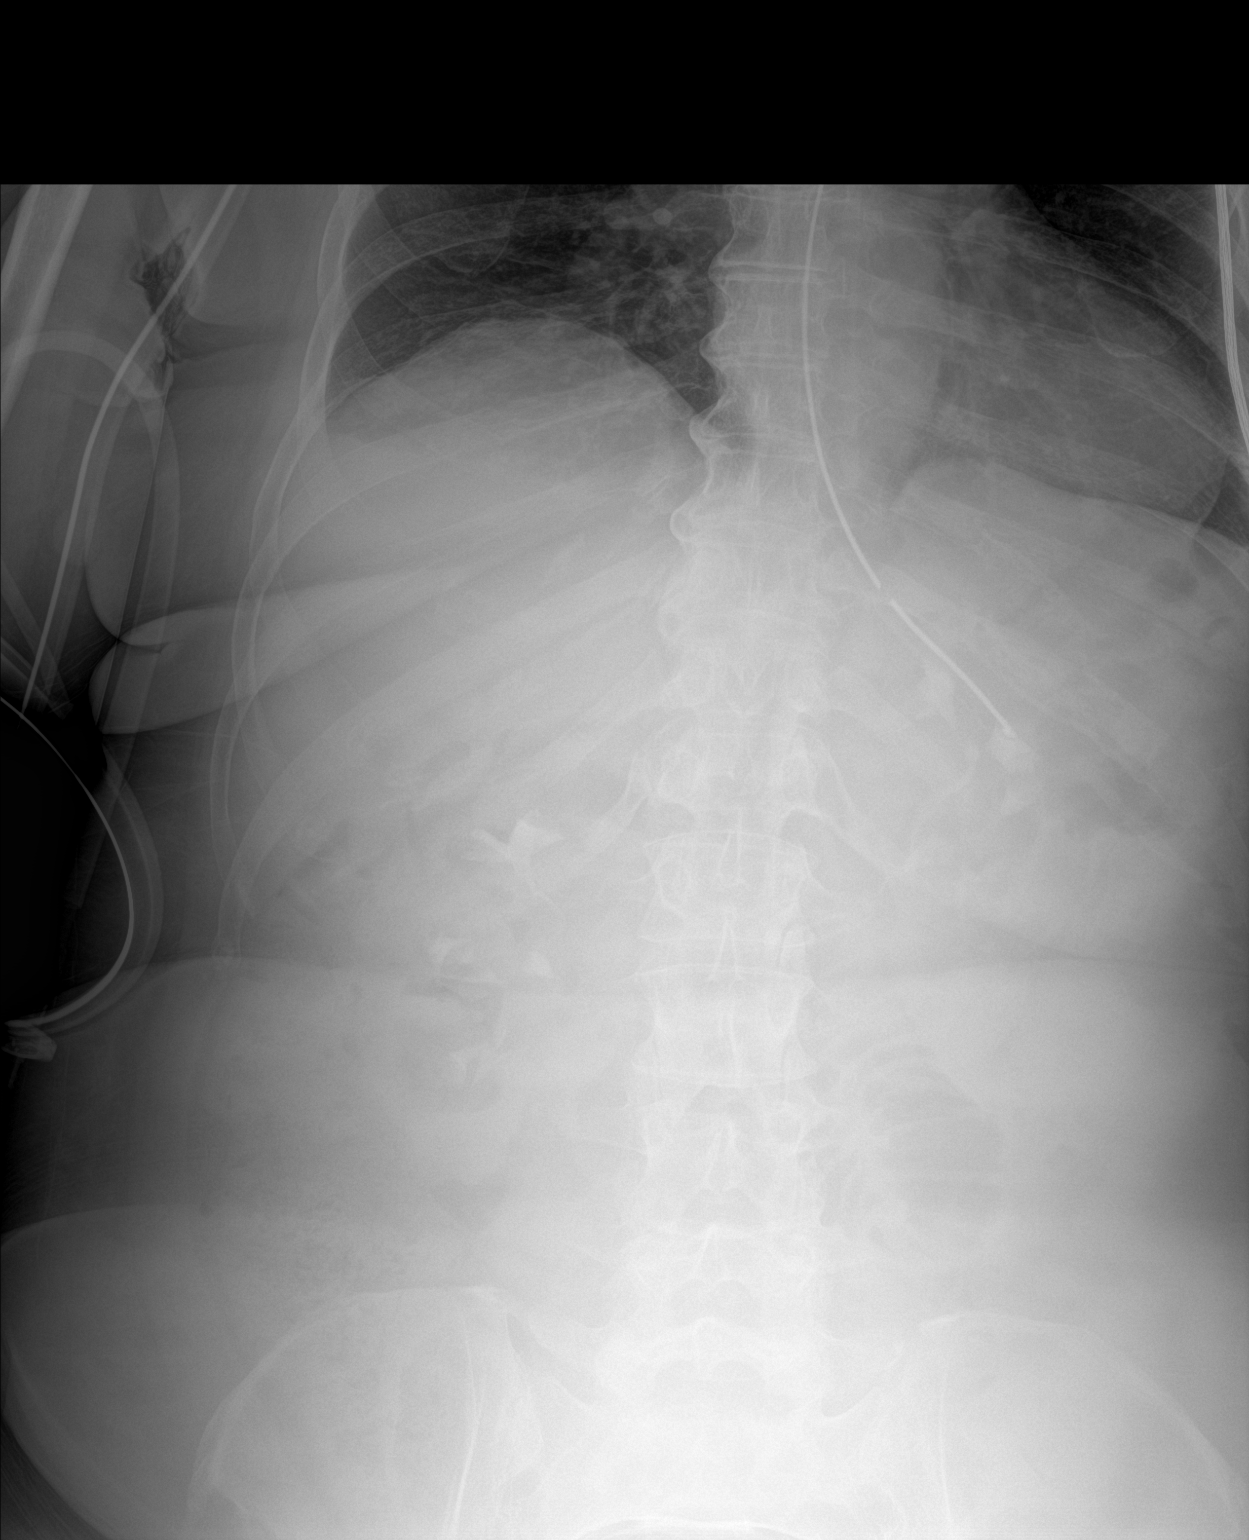

[2 of 2 positions shown; findings below may reference images not displayed]

FINDINGS: The enteric tube tip projects over the gastric body. Again noted are
dilated loops of small bowel consistent with the previously
described small bowel obstruction. Excreted contrast is noted within
the collecting system.
IMPRESSION: 1. Enteric tube projects over the gastric body.
2. Persistent small bowel obstruction.

## 2021-08-19 IMAGING — CT CT ABD-PELV W/ CM
2 of 5 series · 16 of 46 positions shown, 18 images · IV contrast (APPLIED)
Comparison: Abdominal x-ray 07/22/2020

CLINICAL DATA: Diffuse abdominal pain for 3 days. History of
appendectomy and hysterectomy

EXAM:
CT ABDOMEN AND PELVIS WITH CONTRAST
TECHNIQUE: Multidetector CT imaging of the abdomen and pelvis was performed
using the standard protocol following bolus administration of
intravenous contrast.
CONTRAST:  100mL OMNIPAQUE IOHEXOL 300 MG/ML  SOLN

[Series 3: abd/ pelvis 5.0 i30f 2 · axial · 0.79mm/px · z∈[+726,+1141]mm · 13 of 95 slices shown, 15 images]
[im 6/95  soft-tissue]
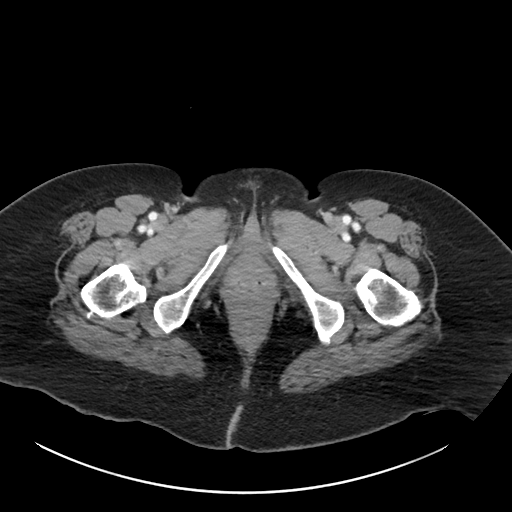
[im 6/95  bone]
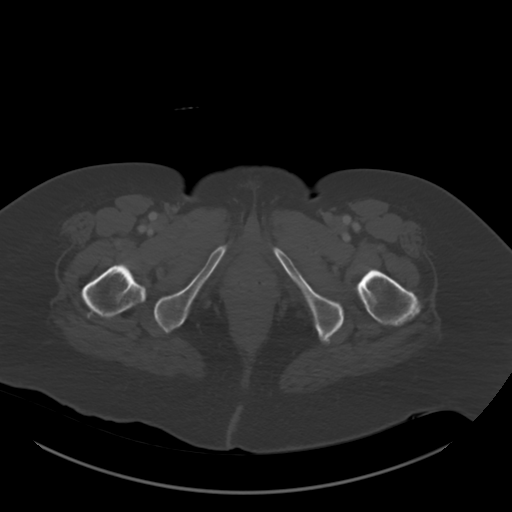
[im 12/95  soft-tissue]
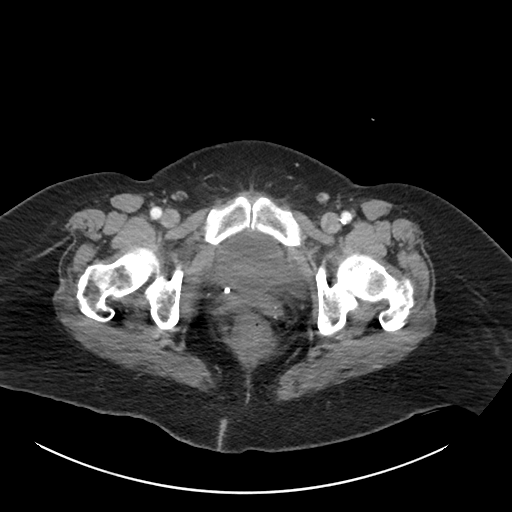
[im 23/95  soft-tissue]
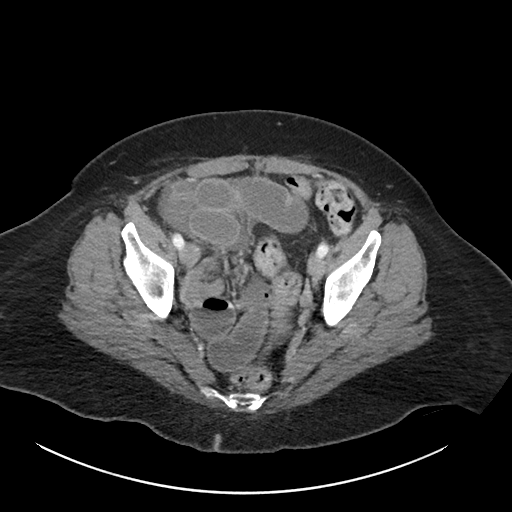
[im 28/95  soft-tissue]
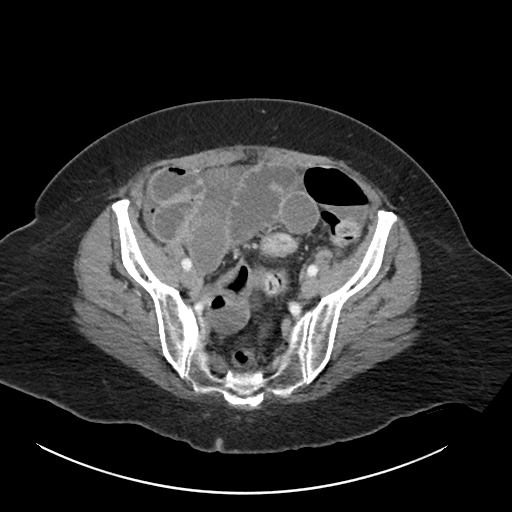
[im 34/95  soft-tissue]
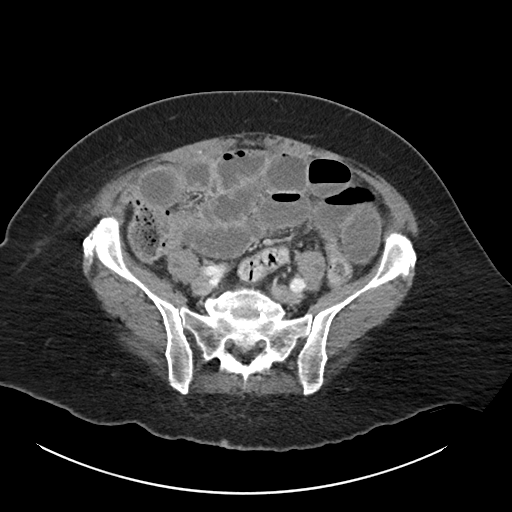
[im 39/95  soft-tissue]
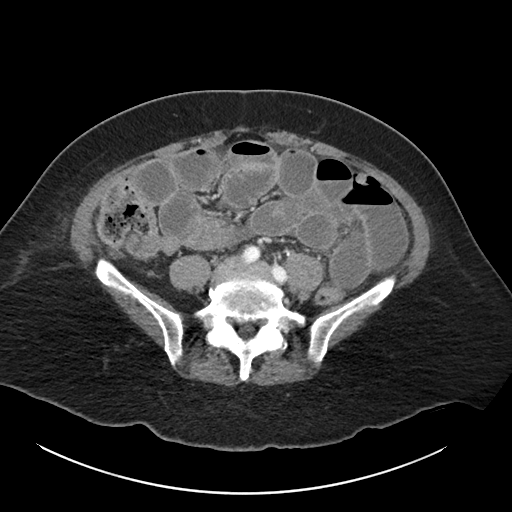
[im 50/95  soft-tissue]
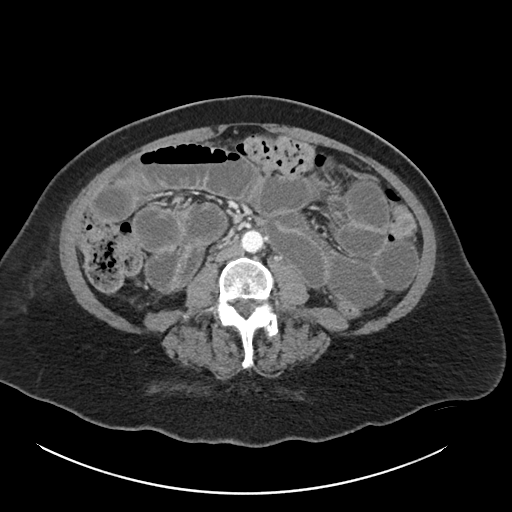
[im 56/95  soft-tissue]
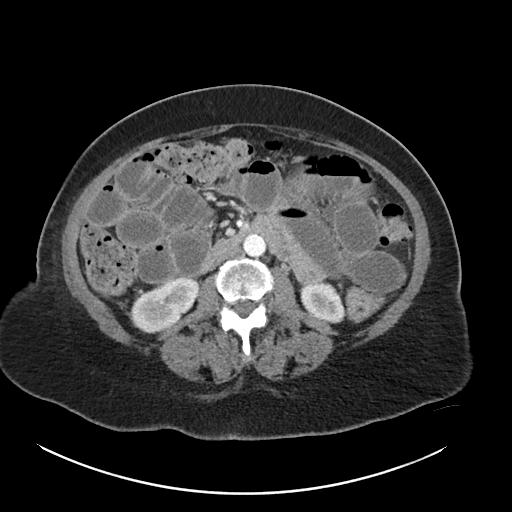
[im 61/95  soft-tissue]
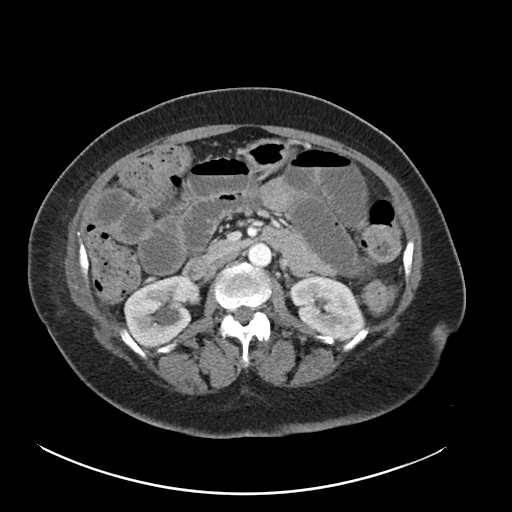
[im 61/95  bone]
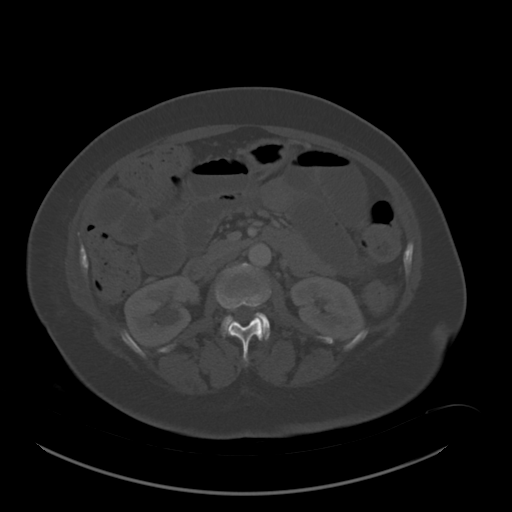
[im 67/95  soft-tissue]
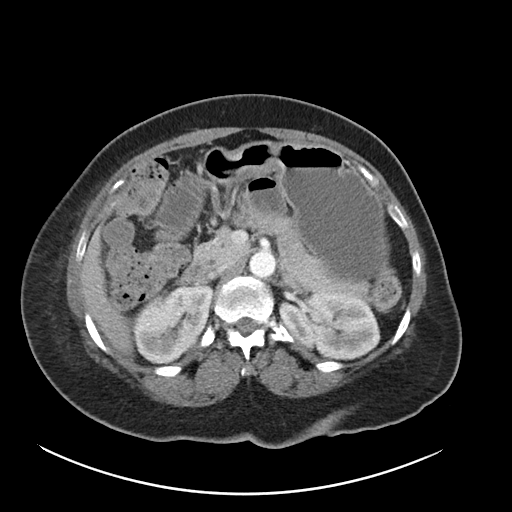
[im 72/95  soft-tissue]
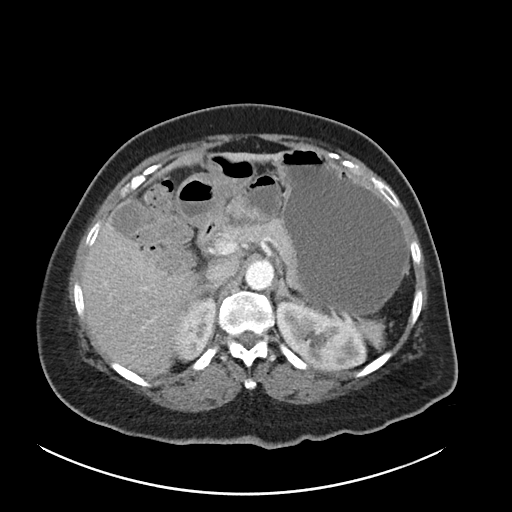
[im 83/95  soft-tissue]
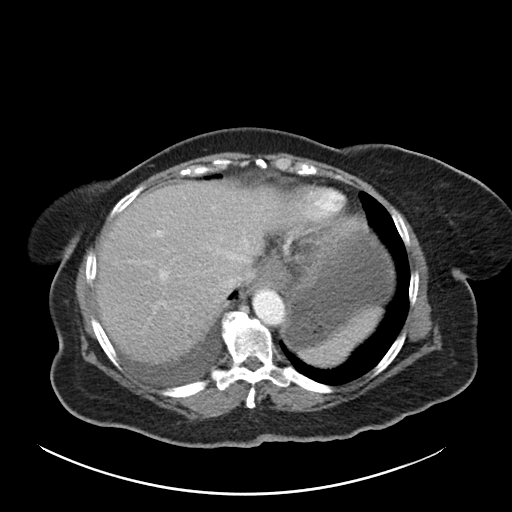
[im 89/95  soft-tissue]
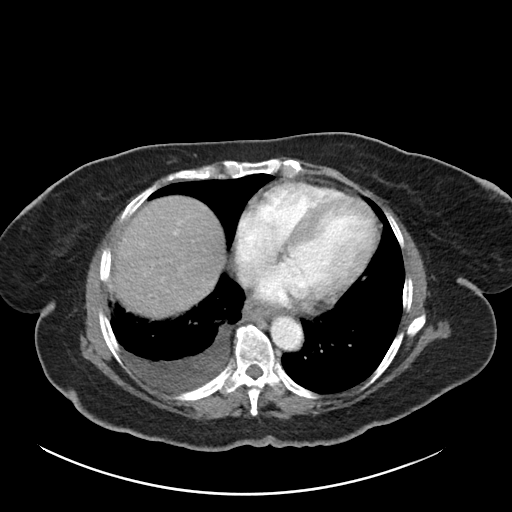

[Series 6: coronal soft tissue · coronal · 0.78mm/px · 3 of 100 slices shown]
[im 34/100  soft-tissue]
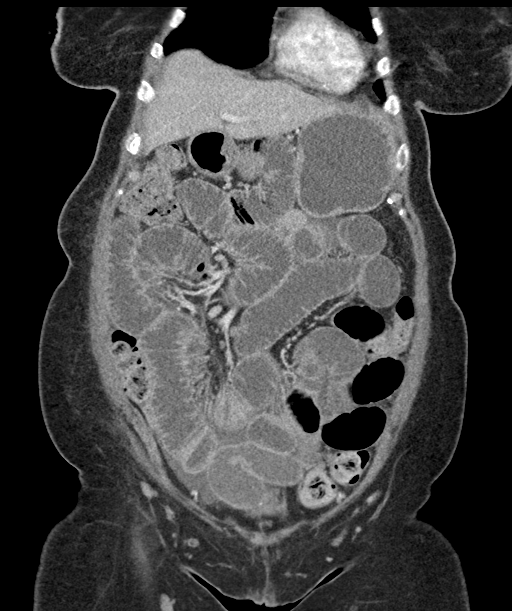
[im 45/100  soft-tissue]
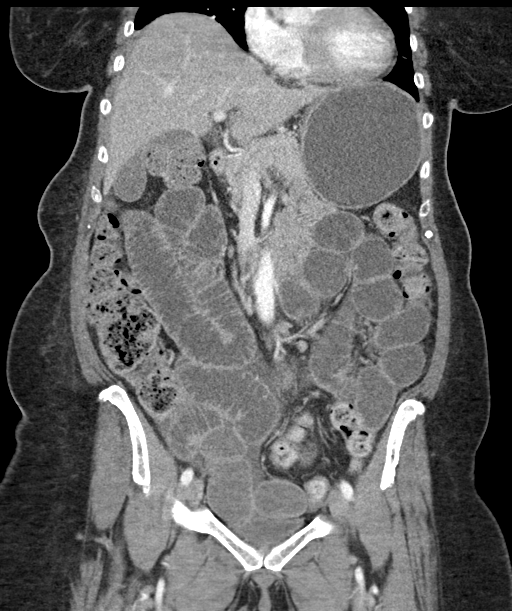
[im 56/100  soft-tissue]
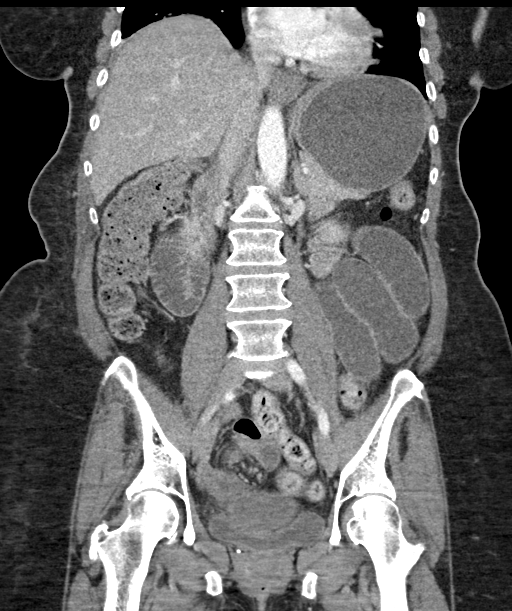

[16 of 46 positions shown; findings below may reference images not displayed]

FINDINGS: Lower chest: Layering small right-sided pleural effusion. Left lung
bases clear. Heart size is within normal limits.

Hepatobiliary: No focal liver abnormality is seen. No gallstones,
gallbladder wall thickening, or biliary dilatation.

Pancreas: Unremarkable. No pancreatic ductal dilatation or
surrounding inflammatory changes.

Spleen: Normal in size without focal abnormality.

Adrenals/Urinary Tract: Unremarkable adrenal glands. 4 mm
nonobstructing stone within the lower pole of the left kidney.
cm right renal sinus cyst. No hydronephrosis. Urinary bladder is
largely decompressed but appears grossly unremarkable.

Stomach/Bowel: Numerous dilated loops of small bowel throughout the
abdomen measuring up to 3.5 cm in diameter. Small bowel is largely
fluid-filled. The stomach is also dilated and fluid-filled. There is
transition point to decompressed small bowel within the right lower
quadrant near the base of the cecum (series 7, images 41-44). There
is a small volume of stool within the colon. The colon is otherwise
decompressed.

Vascular/Lymphatic: No significant vascular findings are present. No
enlarged abdominal or pelvic lymph nodes.

Reproductive: Status post hysterectomy. No adnexal masses.

Other: Trace volume of free fluid within the right lower quadrant.
No abdominopelvic fluid collection. No pneumoperitoneum. Tiny fat
containing umbilical hernia.

Musculoskeletal: No acute or significant osseous findings.
IMPRESSION: 1. Small-bowel obstruction with transition point within the right
lower quadrant near the base of the cecum, presumably secondary to
adhesions.
2. Trace free fluid within the right lower quadrant, likely
reactive. No organized fluid collection or pneumoperitoneum.
3. Small right-sided pleural effusion.
4. Nonobstructing 4 mm left renal stone.

These results were called by telephone at the time of interpretation
on 07/23/2020 at [DATE] to provider CASS ORAMA , who verbally
acknowledged these results.

## 2021-08-20 IMAGING — DX DG ABD PORTABLE 1V
1 series · 2 of 2 positions shown · non-contrast
Comparison: 07/23/2020

CLINICAL DATA: Small-bowel obstruction

EXAM:
PORTABLE ABDOMEN - 1 VIEW

[Series 1: abdomen · 0.14mm/px · 2 of 2 slices shown]
[im 1/2]
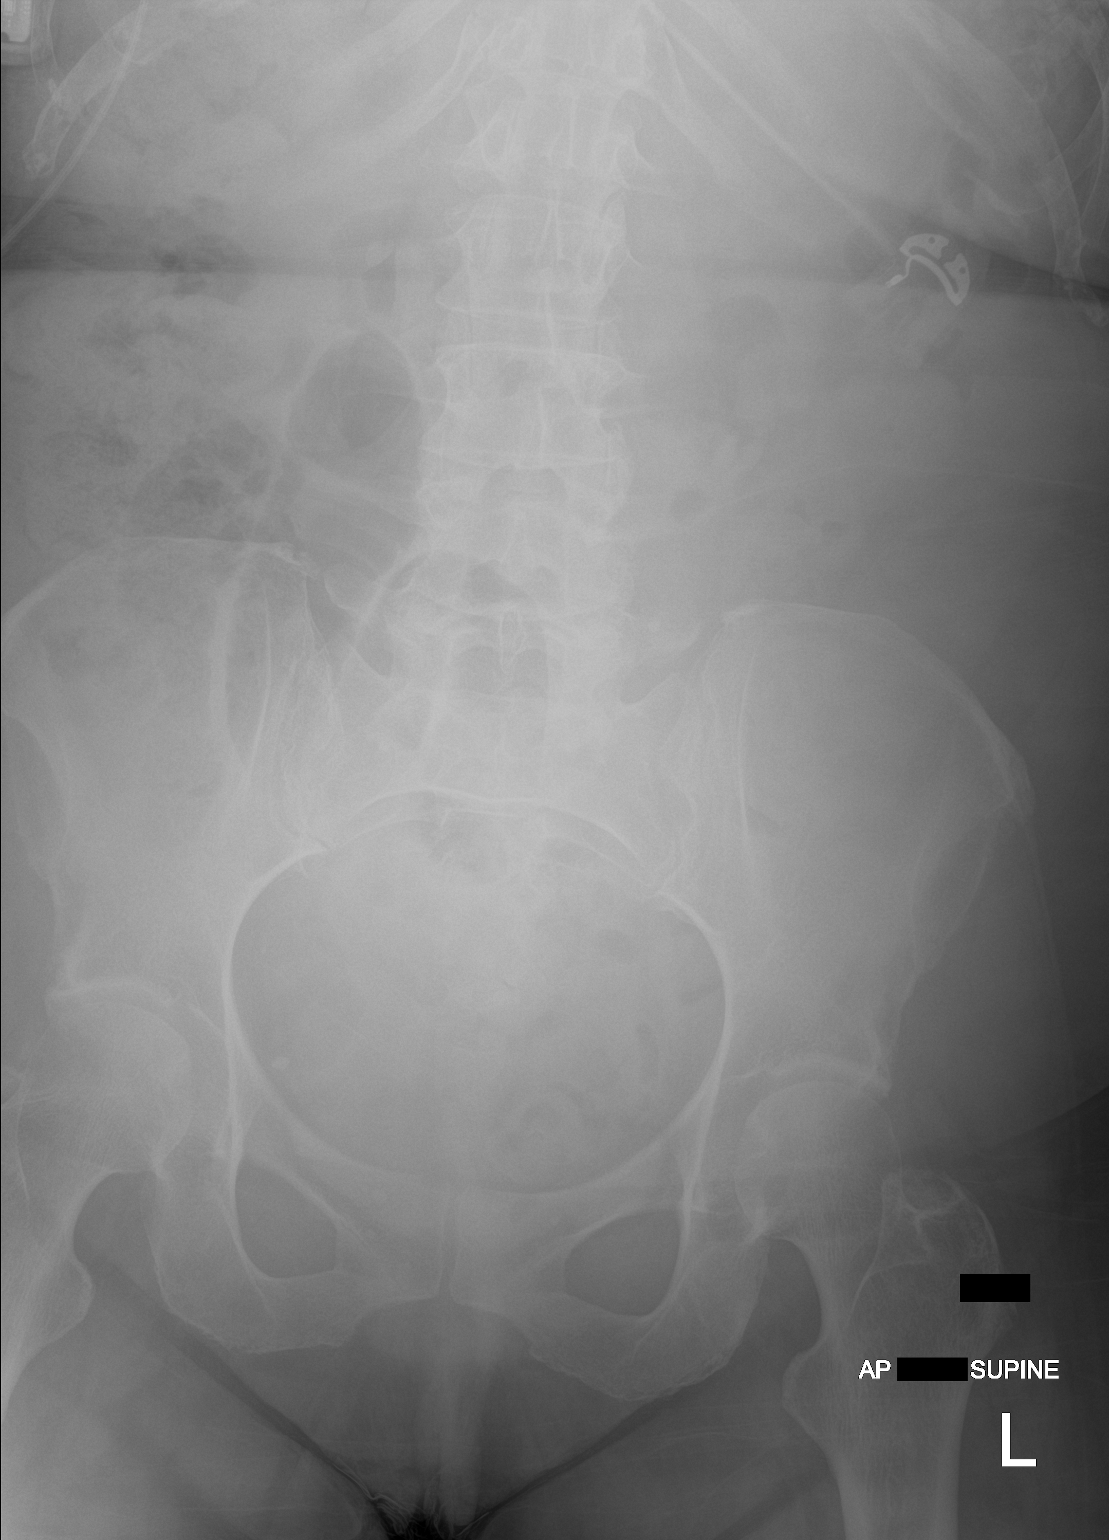
[im 2/2]
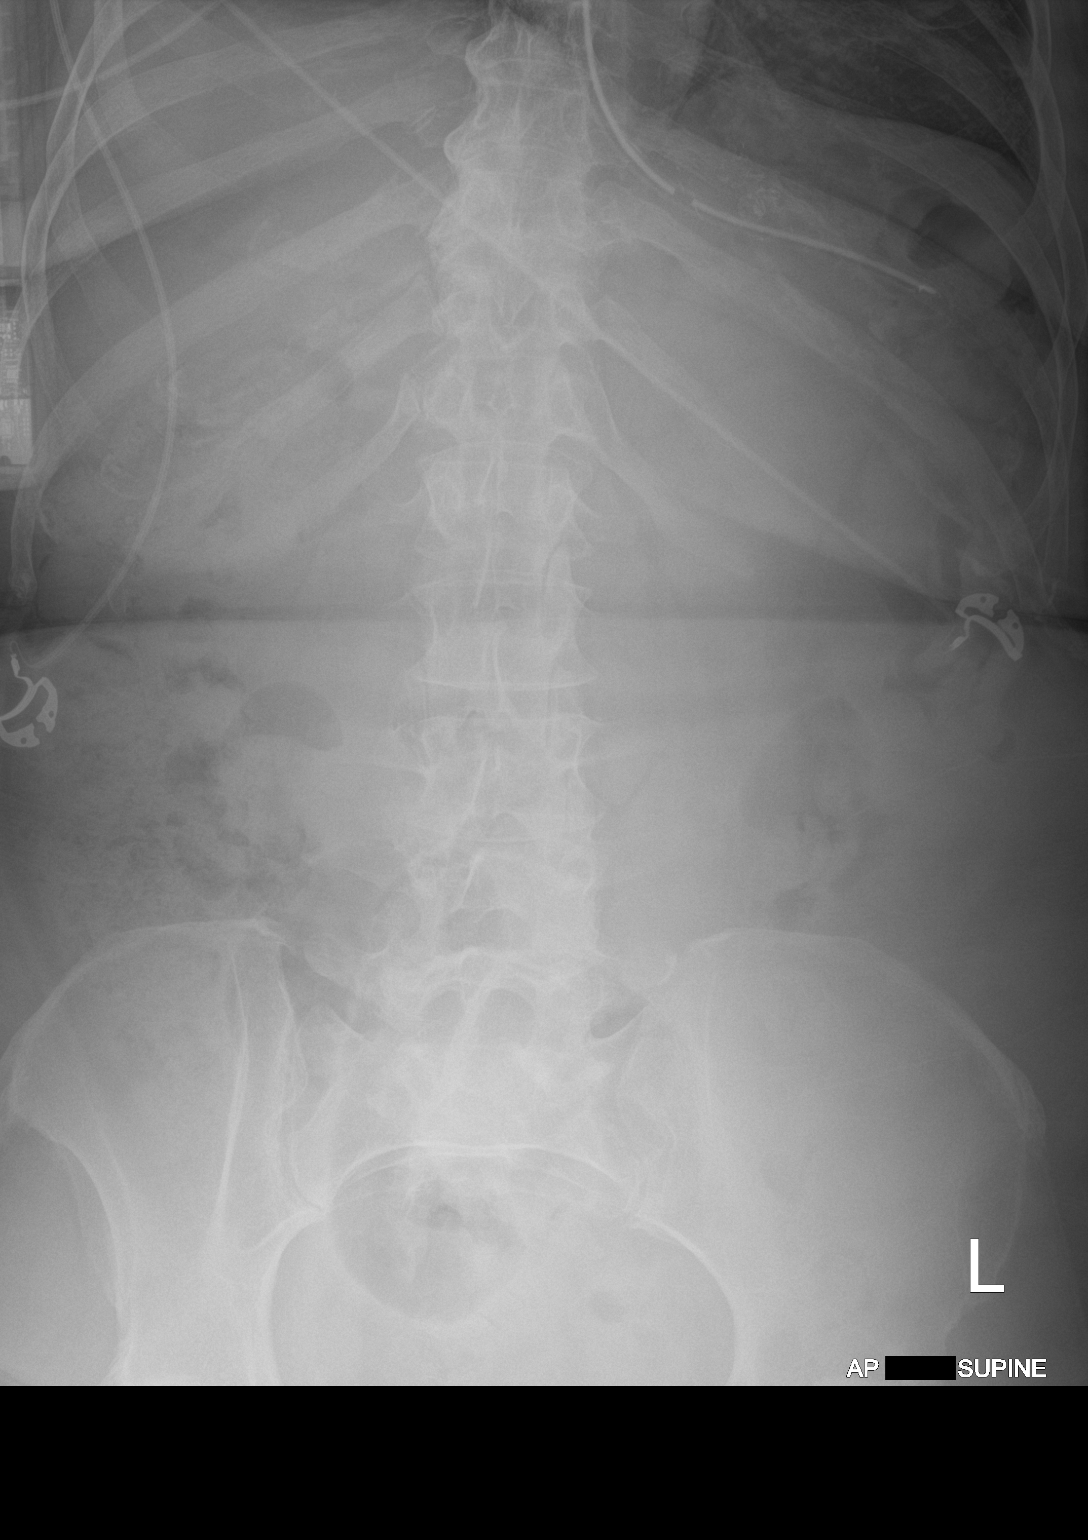

[2 of 2 positions shown; findings below may reference images not displayed]

FINDINGS: The enteric tube projects over the stomach. If the patient was
administered oral contrast it appears that the majority of the
contrast by the resides within the stomach or small bowel. There is
no clear oral contrast within the colon.
IMPRESSION: The enteric tube projects over the stomach. No oral contrast
visualized in the colon.

## 2021-08-23 IMAGING — DX DG ABD PORTABLE 1V
1 series · 1 of 1 positions shown · non-contrast
Comparison: 07/26/2020

CLINICAL DATA: Follow-up small bowel obstruction.

EXAM:
PORTABLE ABDOMEN - 1 VIEW

[abdomen kub]
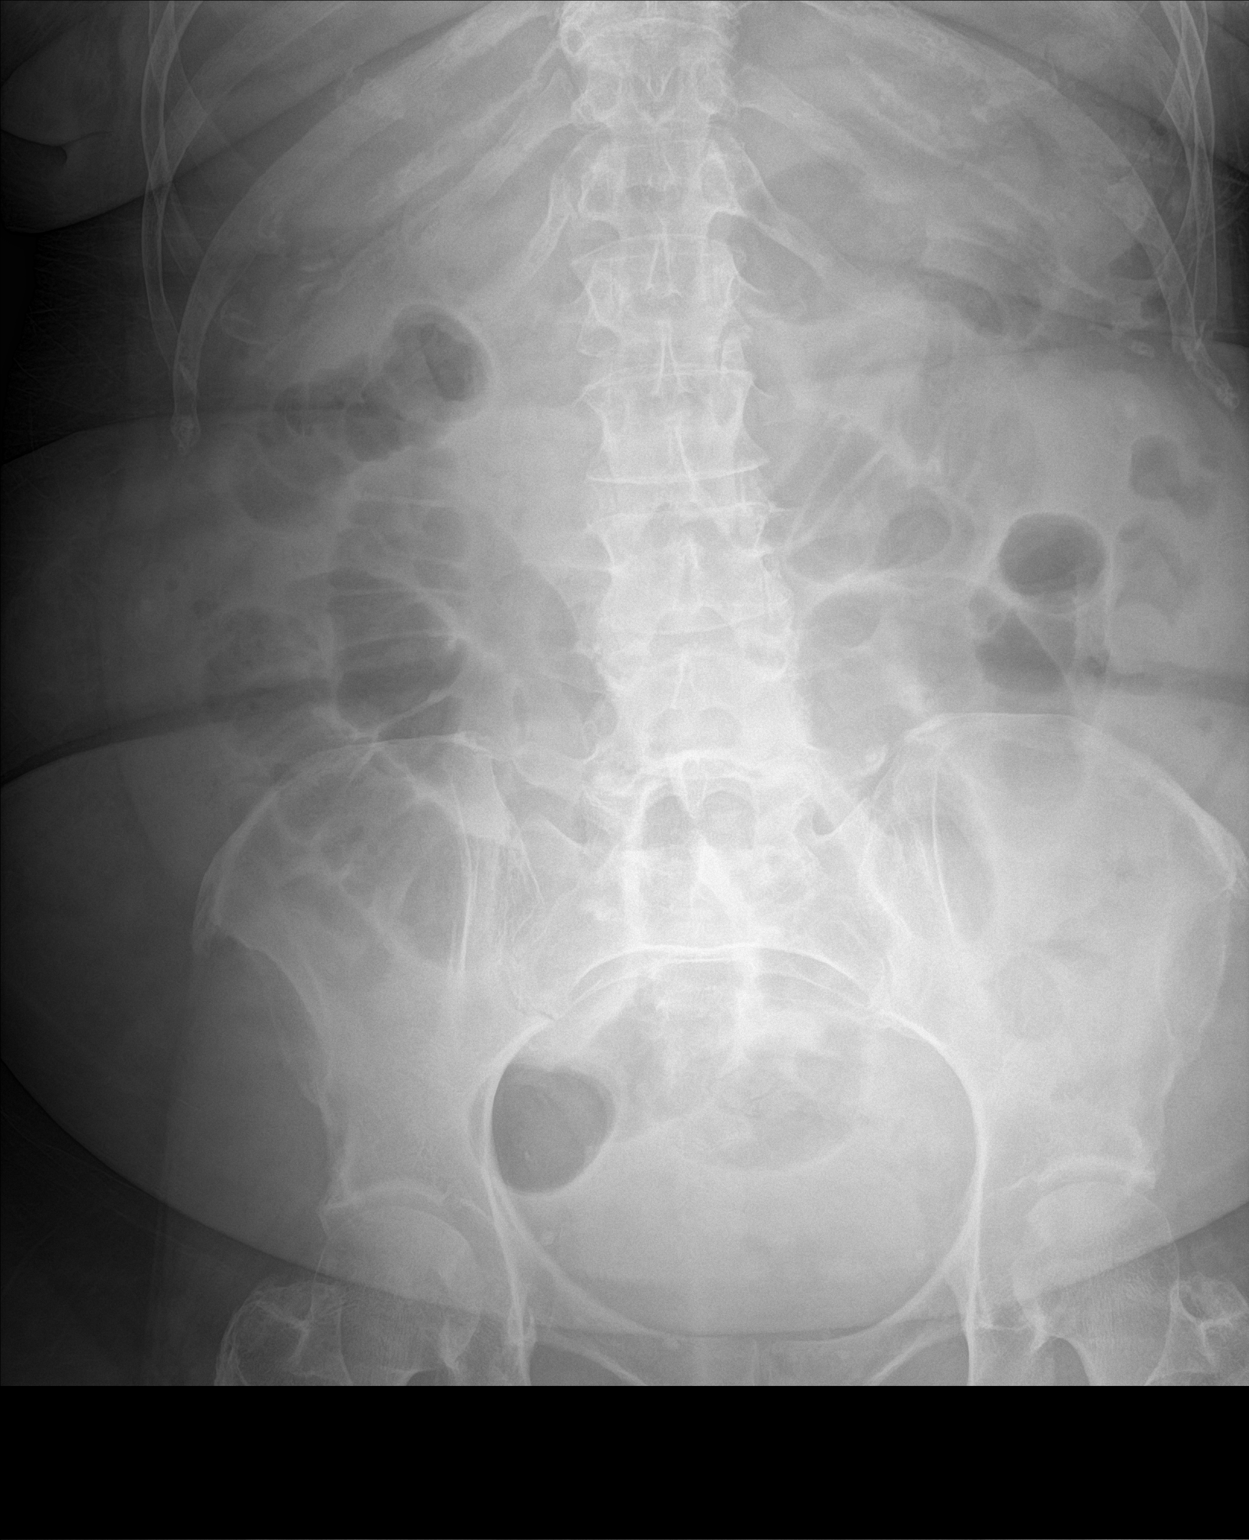

[1 of 1 positions shown; findings below may reference images not displayed]

FINDINGS: Mildly distended gas-filled loops of small bowel are again noted
with a few more distended loops now identified.

No other changes are noted.
IMPRESSION: Slightly distended small bowel loops, now with a few more distended
loops identified.

## 2021-09-06 ENCOUNTER — Other Ambulatory Visit: Payer: Self-pay | Admitting: Adult Health

## 2021-09-06 ENCOUNTER — Other Ambulatory Visit: Payer: Self-pay | Admitting: Internal Medicine

## 2021-09-09 NOTE — Progress Notes (Signed)
Complete Physical  Assessment and Plan:  Encounter for Annual Physical Exam with abnormal findings Due annually  Health Maintenance reviewed Healthy lifestyle reviewed and goals set  Tortuous aorta (HCC) Control blood pressure, cholesterol, glucose, increase exercise.   Essential hypertension - above goal today, ideally would like to stop clonidine - reviewed and added amlodipine ;hx of edema at HIGH DOSE - sent in 5 mg, start with 1/2 tab and taper up - hold clonidine if <130/80 - DASH diet, exercise and monitor at home.  - close follow up 1 month  -     CBC with Differential/Platelet -     CMP/GFR -     TSH -     Urinalysis, Routine w reflex microscopic -     Microalbumin / creatinine urine ratio -     EKG 12-Lead  Diabetes mellitus without complication (HCC) Discussed general issues about diabetes pathophysiology and management., Educational material distributed., Suggested low cholesterol diet., Encouraged aerobic exercise., Discussed foot care., Reminded to get yearly retinal exam prior to end of the year -     Hemoglobin A1c  OSA (obstructive sleep apnea)/ Obesity hypoventilation syndrome (HCC) Sleep apnea- continue CPAP, CPAP is helping with daytime fatigue weight loss still advised.   Mixed hyperlipidemia associated with T2DM (HCC) LDL goal <70, continue rosuvastatin  check lipids, decrease fatty foods, increase activity. -     Lipid panel  Obesity - BMI 33 - long discussion about weight loss, diet, and exercise - excellent progress with exercise  - phentermine was helpful, did tolerate without SE but stopped and continue to hold due to recent elevated BP- restart once controlled - continue close q24m follow up   Vitamin D deficiency Continue supplementation Check vitamin D level  Goiter Biopsy negative, stable on f/u US 12/2018, no notable changes on exam; monitor - TSH  Environmental allergies Continue OTC allergy pills  Chronic right-sided low back  pain without sciatica Established with Dr. Magnus Ivan, gabapentin PRN but hasn't needed Improved with walking and weight loss  Medication management -     Magnesium  B12 deficiency       -      Vitamin B12   Need for influenza vaccine Quadrivalent flu vaccine administered without complication today    Orders Placed This Encounter  Procedures   Flu Vaccine QUAD 6+ mos PF IM (Fluarix Quad PF)   CBC with Differential/Platelet   COMPLETE METABOLIC PANEL WITH GFR   Magnesium   Lipid panel   TSH   Hemoglobin A1c   VITAMIN D 25 Hydroxy (Vit-D Deficiency, Fractures)   Microalbumin / creatinine urine ratio   Urinalysis, Routine w reflex microscopic   Vitamin B12   EKG 12-Lead   HM DIABETES FOOT EXAM     Discussed med's effects and SE's. Screening labs and tests as requested with regular follow-up as recommended. Over 40 minutes of exam, counseling, chart review, and complex, high level critical decision making was performed this visit.   Future Appointments  Date Time Provider Department Center  10/13/2021  3:30 PM Judd Gaudier, NP [facility] None  12/22/2021  9:30 AM Judd Gaudier, NP [facility] None  09/10/2022 10:00 AM Judd Gaudier, NP [facility] None    HPI  60 y.o. female, presents for a complete physical and follow up for has Hypertension; Hyperlipidemia associated with type 2 diabetes mellitus (HCC); Type 2 diabetes mellitus with obesity (HCC); Environmental allergies; Obesity hypoventilation syndrome (HCC) ; Goiter; Vitamin D deficiency; Obesity (BMI 30.0-34.9); Tortuous aorta (HCC); Carpal  tunnel syndrome, left upper limb; S/P small bowel resection; B12 deficiency; and OSA (obstructive sleep apnea) on their problem list.   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 She follows with [facility].   This year she had SBO and had resection in 07/2020, has done well since.  She has hx of right hip/back pain, and  bilateral knee pain, seeing Dr. Magnus Ivan, but reports symptoms improved with daily walking. Takes gabapentin PRN pain but hasn't needed.   She has OSA, on CPAP, does well with this, but admits only wearing 3 days a week or so. Wants a follow up test at some point as last remote but declines at this time in light of covid.   BMI is Body mass index is 33.55 kg/m., she has been working on diet and exercise. Walking 3 miles daily, walking with a group of ladies in her neighborhood.   She is doing smaller portions, limiting carbs, sister was visiting recently and gained some weight  She is pushing water intake, getting at least 80 fluid ounces daily.  Off of phentermine, wants to restart, will do 1/2 tab only Notes she snacks more off of phentermine  Working to increase water intake Wt Readings from Last 3 Encounters:  09/10/21 192 lb 6.4 oz (87.3 kg)  01/14/21 185 lb (83.9 kg)  09/19/20 180 lb (81.6 kg)   She does check blood pressures at home, recently ranging 130-140s/80s since recent admission for SBO, today their BP is BP: (!) 148/74  Currently taking ziac 10/6.25, lasix 40 mg BID, olmesartan 40 mg daily, clonidine 0.2 mg BID, hydralazine 25 mg TID without notable improvement.   She does workout. She denies chest pain, shortness of breath, dizziness. 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 Ophelia Charter in [place] retinal specialist, had normal CT chest 2012, normal CXR 2017 other than tortuous aorta.   She is on cholesterol medication (rosuvastatin 5 mg once weekly) and denies myalgias. Her cholesterol is not at goal of LDL <70. The cholesterol last visit was:   Lab Results  Component Value Date   CHOL 177 01/14/2021   HDL 107 01/14/2021   LDLCALC 55 01/14/2021   TRIG 70 01/14/2021   CHOLHDL 1.7 01/14/2021   She has been working on diet and exercise for Diabetes without complications,  currently treated by metformin 2000 mg daily, she is on bASA, she is on ACE/ARB, and denies nausea, paresthesia of the feet, polydipsia and polyuria. She does check fasting glucose, running 80s-115.  Last A1C was:  Lab Results  Component Value Date   HGBA1C 6.2 (H) 01/14/2021   Last GFR: Lab Results  Component Value Date   GFRAA 120 01/14/2021   She has hx of goiter with nodule, had negative biopsy in 2012, f/u thyroid US 12/2018 showed nodule was stable/minimal changes.  Lab Results  Component Value Date   TSH 0.91 01/14/2021   Patient is on Vitamin D supplement.   Lab Results  Component Value Date   VD25OH 90 01/14/2021         Current Medications:  Current Outpatient Medications on File Prior to Visit  Medication Sig Dispense Refill   aspirin EC 81 MG tablet Take 81 mg by mouth daily.     bisoprolol-hydrochlorothiazide (ZIAC) 10-6.25 MG tablet TAKE 1 TABLET DAILY FOR BLOOD PRESSURE 90 tablet 3   butalbital-acetaminophen-caffeine (  FIORICET) 50-325-40 MG tablet Take        1 to 2 tablets    4 x  /day as needed for Pain or Headache 30 tablet 0   cloNIDine (CATAPRES) 0.2 MG tablet TAKE 1 TABLET BY MOUTH THREE TIMES A DAY WITH MEALS FOR BLOOD PRESSURE 270 tablet 3   Cyanocobalamin (VITAMIN B-12 SL) Place 1 tablet under the tongue daily.      diclofenac sodium (VOLTAREN) 1 % GEL Apply 2 g topically 4 (four) times daily. (Patient taking differently: Apply 2 g topically 4 (four) times daily as needed (pain).) 100 g 3   fluticasone (FLONASE) 50 MCG/ACT nasal spray Place 2 sprays into both nostrils daily. (Patient taking differently: Place 2 sprays into both nostrils daily as needed for allergies.) 16 g 6   furosemide (LASIX) 40 MG tablet Take     1 tablet 2 x /day for BP & Fluid Retention / Ankle Swelling    TAKE 1 TABLET TWO TIMES A DAY FOR BLOOD PRESSURE AND FLUID RETENTION / ANKLE SWELLING 180 tablet 3   gabapentin (NEURONTIN) 100 MG capsule Take     1 capsule    3 x /day for Chronic  Pain             TAKE 1 CAPSULE BY MOUTH 3 TIMES A DAY FOR PAIN 270 capsule 1   hydrALAZINE (APRESOLINE) 25 MG tablet Take  1 tablet  3 x  /day for BP & Heart 270 tablet 3   Magnesium 250 MG TABS Take 250 mg by mouth daily.     metFORMIN (GLUCOPHAGE-XR) 500 MG 24 hr tablet Take  2 tablets  2 x /day with Meals for Diabetes                           /          TAKE 2 TABLETS TWICE X DAY 360 tablet 3   Multiple Vitamins-Minerals (MULTIVITAMIN PO) Take by mouth daily.     olmesartan (BENICAR) 40 MG tablet TAKE 1 TABLET DAILY FOR BP & DIABETIC KIDNEY PROTECTION 90 tablet 1   phentermine (ADIPEX-P) 37.5 MG tablet TAKE 1/2 TO 1 TABLET EVERY MORNING FOR DIETING & WEIGHTLOSS 90 tablet 1   RESTASIS 0.05 % ophthalmic emulsion Place 1 drop into both eyes 3 (three) times daily as needed for dry eyes.     rosuvastatin (CRESTOR) 5 MG tablet Take 1 tablet (5 mg total) by mouth once a week. 13 tablet 3   Vitamin D, Ergocalciferol, (DRISDOL) 1.25 MG (50000 UNIT) CAPS capsule Take 1 capsule (50,000 Units total) by mouth every 7 (seven) days. Take 1 capsule x /week for Severe Vitamin D Deficiency (Tuesday) 26 capsule 1   No current facility-administered medications on file prior to visit.   Allergies:  Allergies  Allergen Reactions   Amlodipine Swelling   Naproxen Hives   Meloxicam & Diet Manage Prod Rash   Medical History:  She has Hypertension; Hyperlipidemia associated with type 2 diabetes mellitus (HCC); Type 2 diabetes mellitus with obesity (HCC); Environmental allergies; Obesity hypoventilation syndrome (HCC) ; Goiter; Vitamin D deficiency; Obesity (BMI 30.0-34.9); Tortuous aorta (HCC); Carpal tunnel syndrome, left upper limb; S/P small bowel resection; B12 deficiency; and OSA (obstructive sleep apnea) on their problem list.  Health Maintenance:   Immunization History  Administered Date(s) Administered   Influenza Inj Mdck Quad With Preservative 07/17/2018, 08/15/2019, 08/21/2020   Influenza Split  07/18/2013, 10/01/2014  Influenza,inj,Quad PF,6+ Mos 09/10/2021   Influenza,inj,quad, With Preservative 09/02/2016   PFIZER(Purple Top)SARS-COV-2 Vaccination 01/05/2020, 02/12/2020   Pneumococcal Polysaccharide-23 06/16/1999   Tdap 03/10/2009, 07/18/2019   Tetanus: 2020 Pneumovax: 2000, due age 28 Flu vaccine: 08/2020, TODAY  Shingles: DUE, wants to call and ask Covid 19: 2/2, 2021, pfizer, had several boosters, record requested  LMP:  Hysterectomy Pelvic/PAP: last by GYN, s/p hysterectomy, never abormal, follow up for recommendations MGM:  12/2020 Colonoscopy: 2014 due 10 years Echo 2017  Last Eye Exam: Dr. Hyacinth Meeker, 09/09/2021, report requested  Last Dental Exam: Dr. Dan Humphreys, last visit 2022, goes q16m  Patient Care Team: Lucky Cowboy, MD as PCP - General (Internal Medicine) Kathryne Hitch, MD as Consulting Physician (Orthopedic Surgery)  Surgical History:  She has a past surgical history that includes Eye surgery; achiles tendon; Abdominal hysterectomy; Tonsillectomy; and laparotomy (N/A, 07/27/2020). Family History:  Herfamily history includes Diabetes in her father and paternal grandmother; Heart disease in her father, paternal grandmother, and sister; Hypertension in her brother, mother, and sister. Social History:  She reports that she has never smoked. She has never used smokeless tobacco. She reports current alcohol use. She reports that she does not use drugs.  Review of Systems: Review of Systems  Constitutional:  Negative for chills, fever and malaise/fatigue.  HENT:  Negative for congestion, ear pain and sore throat.   Eyes: Negative.   Respiratory:  Negative for cough, shortness of breath and wheezing.   Cardiovascular:  Negative for chest pain, palpitations and leg swelling.  Gastrointestinal:  Negative for abdominal pain, blood in stool, constipation, diarrhea, heartburn and melena.  Genitourinary: Negative.   Musculoskeletal:  Negative for back pain,  falls, joint pain, myalgias and neck pain.  Skin: Negative.   Neurological:  Negative for dizziness, sensory change, loss of consciousness and headaches.  Psychiatric/Behavioral:  Negative for depression. The patient is not nervous/anxious and does not have insomnia.    Physical Exam: Estimated body mass index is 33.55 kg/m as calculated from the following:   Height as of this encounter: 5' 3.5" (1.613 m).   Weight as of this encounter: 192 lb 6.4 oz (87.3 kg). BP (!) 148/74   Pulse (!) 51   Temp (!) 97.3 F (36.3 C)   Ht 5' 3.5" (1.613 m)   Wt 192 lb 6.4 oz (87.3 kg)   SpO2 99%   BMI 33.55 kg/m  General Appearance: Well nourished, in no apparent distress.  Eyes: PERRLA, EOMs, conjunctiva no swelling or erythema Sinuses: No Frontal/maxillary tenderness  ENT/Mouth: Ext aud canals clear, normal light reflex with TMs without erythema, bulging. Good dentition. No erythema, swelling, or exudate on post pharynx. Tonsils not swollen or erythematous. Hearing normal.  Neck: Supple, thyroid mildly enlarged on right. No bruits  Respiratory: Respiratory effort normal, BS equal bilaterally without rales, rhonchi, wheezing or stridor.  Cardio: RRR, no murmur, without rubs or gallops. Brisk peripheral pulses without ankle edema, mild foot mild non-pitting edema Chest: symmetric, with normal excursions and percussion.  Breasts: Getting annual mammogram, no concerns, regular self checks, declines manual today  Abdomen: Soft, nontender, no guarding, rebound, hernias, masses, or organomegaly.  Lymphatics: Non tender without lymphadenopathy.  Genitourinary: Defer to GYN Musculoskeletal: Full ROM all peripheral extremities,5/5 strength, and normal gait.  Skin: Warm, dry without rashes, lesions, ecchymosis.  Neuro: Cranial nerves intact, reflexes equal bilaterally. Normal muscle tone, no cerebellar symptoms. Sensation intact. Sensation intact to monofilament bilateral feet.  Psych: Awake and oriented X  3, normal affect,  Insight and Judgment appropriate.   EKG: Sinus bradycardia  Dan Maker, NP 12:39 PM [facility]

## 2021-09-10 ENCOUNTER — Other Ambulatory Visit: Payer: Self-pay

## 2021-09-10 ENCOUNTER — Encounter: Payer: Self-pay | Admitting: Adult Health

## 2021-09-10 ENCOUNTER — Ambulatory Visit (INDEPENDENT_AMBULATORY_CARE_PROVIDER_SITE_OTHER): Payer: BC Managed Care – PPO | Admitting: Adult Health

## 2021-09-10 VITALS — BP 148/74 | HR 51 | Temp 97.3°F | Ht 63.5 in | Wt 192.4 lb

## 2021-09-10 DIAGNOSIS — E538 Deficiency of other specified B group vitamins: Secondary | ICD-10-CM

## 2021-09-10 DIAGNOSIS — Z136 Encounter for screening for cardiovascular disorders: Secondary | ICD-10-CM | POA: Diagnosis not present

## 2021-09-10 DIAGNOSIS — Z23 Encounter for immunization: Secondary | ICD-10-CM

## 2021-09-10 DIAGNOSIS — Z131 Encounter for screening for diabetes mellitus: Secondary | ICD-10-CM | POA: Diagnosis not present

## 2021-09-10 DIAGNOSIS — E669 Obesity, unspecified: Secondary | ICD-10-CM

## 2021-09-10 DIAGNOSIS — E785 Hyperlipidemia, unspecified: Secondary | ICD-10-CM

## 2021-09-10 DIAGNOSIS — E662 Morbid (severe) obesity with alveolar hypoventilation: Secondary | ICD-10-CM

## 2021-09-10 DIAGNOSIS — I10 Essential (primary) hypertension: Secondary | ICD-10-CM | POA: Diagnosis not present

## 2021-09-10 DIAGNOSIS — Z0001 Encounter for general adult medical examination with abnormal findings: Secondary | ICD-10-CM

## 2021-09-10 DIAGNOSIS — I771 Stricture of artery: Secondary | ICD-10-CM

## 2021-09-10 DIAGNOSIS — Z13220 Encounter for screening for lipoid disorders: Secondary | ICD-10-CM | POA: Diagnosis not present

## 2021-09-10 DIAGNOSIS — Z130 Encounter for screening for diseases of the blood and blood-forming organs and certain disorders involving the immune mechanism: Secondary | ICD-10-CM

## 2021-09-10 DIAGNOSIS — Z0000 Encounter for general adult medical examination without abnormal findings: Secondary | ICD-10-CM

## 2021-09-10 DIAGNOSIS — E049 Nontoxic goiter, unspecified: Secondary | ICD-10-CM

## 2021-09-10 DIAGNOSIS — Z79899 Other long term (current) drug therapy: Secondary | ICD-10-CM | POA: Diagnosis not present

## 2021-09-10 DIAGNOSIS — E1169 Type 2 diabetes mellitus with other specified complication: Secondary | ICD-10-CM

## 2021-09-10 DIAGNOSIS — Z1329 Encounter for screening for other suspected endocrine disorder: Secondary | ICD-10-CM

## 2021-09-10 DIAGNOSIS — E559 Vitamin D deficiency, unspecified: Secondary | ICD-10-CM

## 2021-09-10 DIAGNOSIS — G4733 Obstructive sleep apnea (adult) (pediatric): Secondary | ICD-10-CM

## 2021-09-10 DIAGNOSIS — Z1389 Encounter for screening for other disorder: Secondary | ICD-10-CM

## 2021-09-10 MED ORDER — AMLODIPINE BESYLATE 2.5 MG PO TABS: ORAL_TABLET | ORAL | 0 refills | Status: DC

## 2021-09-10 NOTE — Patient Instructions (Addendum)
Tanya [name] , Thank you for taking time to come for your Annual Wellness Visit. I appreciate your ongoing commitment to your health goals. Please review the following plan we discussed and let me know if I can assist you in the future.   These are the goals we discussed:  Goals      Blood Pressure < 130/80     HEMOGLOBIN A1C < 5.7     LDL CALC < 70     Weight (lb) < 185 lb (83.9 kg)        This is a list of the screening recommended for you and due dates:  Health Maintenance  Topic Date Due   Zoster (Shingles) Vaccine (1 of 2) Never done   Pneumococcal Vaccination (2 - PCV) 06/15/2000   COVID-19 Vaccine (3 - Pfizer risk series) 03/11/2020   Flu Shot  06/01/2021   Hemoglobin A1C  07/17/2021   Eye exam for diabetics  09/29/2021   Complete foot exam   09/10/2022   Colon Cancer Screening  11/24/2022   Mammogram  01/15/2023   Tetanus Vaccine  07/17/2029   Hepatitis C Screening: USPSTF Recommendation to screen - Ages 18-79 yo.  Completed   HPV Vaccine  Aged Out   Pap Smear  Discontinued   HIV Screening  Discontinued   Encourage to get shingrix at [facility]   Please have them send full vaccine record   Check with GYN about a follow up if needed  Please start amlodipine 1/2 tab in the evening, then increase up to 5 mg if needed for BP goal <130/80  Monitor blood pressure closely, try to stop clonidine unless running very high    HYPERTENSION INFORMATION  Monitor your blood pressure at home, please keep a record and bring that in with you to your next office visit.   Go to the ER if any CP, SOB, nausea, dizziness, severe HA, changes vision/speech  Testing/Procedures: HOW TO TAKE YOUR BLOOD PRESSURE: Rest 5 minutes before taking your blood pressure. Don't smoke or drink caffeinated beverages for at least 30 minutes before. Take your blood pressure before (not after) you eat. Sit comfortably with your back supported and both feet on the floor (don't cross your  legs). Elevate your arm to heart level on a table or a desk. Use the proper sized cuff. It should fit smoothly and snugly around your bare upper arm. There should be enough room to slip a fingertip under the cuff. The bottom edge of the cuff should be 1 inch above the crease of the elbow.  Due to a recent study, SPRINT, we have changed our goal for the systolic or top blood pressure number. Ideally we want your top number at 120.  In the [facility] Trial, 5000 people were randomized to a goal BP of 120 and 5000 people were randomized to a goal BP of less than 140. The patients with the goal BP at 120 had LESS DEMENTIA, LESS HEART ATTACKS, AND LESS STROKES, AS WELL AS OVERALL DECREASED MORTALITY OR DEATH RATE.   There was another study that showed taking your blood pressure medications at night decrease cardiovascular events.  However if you are on a fluid pill, please take this in the morning.   If you are willing, our goal BP is the top number of 120.  Your most recent BP:     Take your medications faithfully as instructed. Maintain a healthy weight. Get at least 150 minutes of aerobic exercise per week. Minimize  salt intake. Minimize alcohol intake  DASH Eating Plan DASH stands for "Dietary Approaches to Stop Hypertension." The DASH eating plan is a healthy eating plan that has been shown to reduce high blood pressure (hypertension). Additional health benefits may include reducing the risk of type 2 diabetes mellitus, heart disease, and stroke. The DASH eating plan may also help with weight loss. WHAT DO I NEED TO KNOW ABOUT THE DASH EATING PLAN? For the DASH eating plan, you will follow these general guidelines: Choose foods with a percent daily value for sodium of less than 5% (as listed on the food label). Use salt-free seasonings or herbs instead of table salt or sea salt. Check with your health care provider or pharmacist before using salt substitutes. Eat lower-sodium products, often  labeled as "lower sodium" or "no salt added." Eat fresh foods. Eat more vegetables, fruits, and low-fat dairy products. Choose whole grains. Look for the word "whole" as the first word in the ingredient list. Choose fish and skinless chicken or [place] more often than red meat. Limit fish, poultry, and meat to 6 oz (170 g) each day. Limit sweets, desserts, sugars, and sugary drinks. Choose heart-healthy fats. Limit cheese to 1 oz (28 g) per day. Eat more home-cooked food and less restaurant, buffet, and fast food. Limit fried foods. Cook foods using methods other than frying. Limit canned vegetables. If you do use them, rinse them well to decrease the sodium. When eating at a restaurant, ask that your food be prepared with less salt, or no salt if possible. WHAT FOODS CAN I EAT? Seek help from a dietitian for individual calorie needs. Grains Whole grain or whole wheat bread. Brown rice. Whole grain or whole wheat pasta. Quinoa, bulgur, and whole grain cereals. Low-sodium cereals. Corn or whole wheat flour tortillas. Whole grain cornbread. Whole grain crackers. Low-sodium crackers. Vegetables Fresh or frozen vegetables (raw, steamed, roasted, or grilled). Low-sodium or reduced-sodium tomato and vegetable juices. Low-sodium or reduced-sodium tomato sauce and paste. Low-sodium or reduced-sodium canned vegetables.  Fruits All fresh, canned (in natural juice), or frozen fruits. Meat and Other Protein Products Ground beef (85% or leaner), grass-fed beef, or beef trimmed of fat. Skinless chicken or [place]. Ground chicken or [place]. Pork trimmed of fat. All fish and seafood. Eggs. Dried beans, peas, or lentils. Unsalted nuts and seeds. Unsalted canned beans. Dairy Low-fat dairy products, such as skim or 1% milk, 2% or reduced-fat cheeses, low-fat ricotta or cottage cheese, or plain low-fat yogurt. Low-sodium or reduced-sodium cheeses. Fats and Oils Tub margarines without trans fats. Light or  reduced-fat mayonnaise and salad dressings (reduced sodium). Avocado. Safflower, olive, or canola oils. Natural peanut or almond butter. Other Unsalted popcorn and pretzels. The items listed above may not be a complete list of recommended foods or beverages. Contact your dietitian for more options. WHAT FOODS ARE NOT RECOMMENDED? Grains White bread. White pasta. White rice. Refined cornbread. Bagels and croissants. Crackers that contain trans fat. Vegetables Creamed or fried vegetables. Vegetables in a cheese sauce. Regular canned vegetables. Regular canned tomato sauce and paste. Regular tomato and vegetable juices. Fruits Dried fruits. Canned fruit in light or heavy syrup. Fruit juice. Meat and Other Protein Products Fatty cuts of meat. Ribs, chicken wings, bacon, sausage, bologna, salami, chitterlings, fatback, hot dogs, bratwurst, and packaged luncheon meats. Salted nuts and seeds. Canned beans with salt. Dairy Whole or 2% milk, cream, half-and-half, and cream cheese. Whole-fat or sweetened yogurt. Full-fat cheeses or blue cheese. Nondairy creamers and whipped toppings.  Processed cheese, cheese spreads, or cheese curds. Condiments Onion and garlic salt, seasoned salt, table salt, and sea salt. Canned and packaged gravies. Worcestershire sauce. Tartar sauce. Barbecue sauce. Teriyaki sauce. Soy sauce, including reduced sodium. Steak sauce. Fish sauce. Oyster sauce. Cocktail sauce. Horseradish. Ketchup and mustard. Meat flavorings and tenderizers. Bouillon cubes. Hot sauce. Tabasco sauce. Marinades. Taco seasonings. Relishes. Fats and Oils Butter, stick margarine, lard, shortening, ghee, and bacon fat. Coconut, palm kernel, or palm oils. Regular salad dressings. Other Pickles and olives. Salted popcorn and pretzels. The items listed above may not be a complete list of foods and beverages to avoid. Contact your dietitian for more information. WHERE CAN I FIND MORE INFORMATION? National Heart,  Lung, and Blood Institute: [employer] Document Released: 10/07/2011 Document Revised: 03/04/2014 Document Reviewed: 08/22/2013 [facility] Patient Information 2015 [place], [place]. This information is not intended to replace advice given to you by your health care provider. Make sure you discuss any questions you have with your health care provider.     Amlodipine Tablets What is this medication? AMLODIPINE (am LOE di peen) treats high blood pressure and prevents chest pain (angina). It works by relaxing the blood vessels, which helps decrease the amount of work your heart has to do. It belongs to a group of medications called calcium channel blockers. This medicine may be used for other purposes; ask your health care provider or pharmacist if you have questions. COMMON BRAND NAME(S): Norvasc What should I tell my care team before I take this medication? They need to know if you have any of these conditions: Heart disease Liver disease An unusual or allergic reaction to amlodipine, other medications, foods, dyes, or preservatives Pregnant or trying to get pregnant Breast-feeding How should I use this medication? Take this medication by mouth. Take it as directed on the prescription label at the same time every day. You can take it with or without food. If it upsets your stomach, take it with food. Keep taking it unless your care team tells you to stop. Talk to your care team about the use of this medication in children. While it may be prescribed for children as young as 6 for selected conditions, precautions do apply. Overdosage: If you think you have taken too much of this medicine contact a poison control center or emergency room at once. NOTE: This medicine is only for you. Do not share this medicine with others. What if I miss a dose? If you miss a dose, take it as soon as you can. If it is almost time for your next dose, take only that dose. Do not  take double or extra doses. What may interact with this medication? Clarithromycin Cyclosporine Diltiazem Itraconazole Simvastatin Tacrolimus This list may not describe all possible interactions. Give your health care provider a list of all the medicines, herbs, non-prescription drugs, or dietary supplements you use. Also tell them if you smoke, drink alcohol, or use illegal drugs. Some items may interact with your medicine. What should I watch for while using this medication? Visit your health care provider for regular checks on your progress. Check your blood pressure as directed. Ask your health care provider what your blood pressure should be. Also, find out when you should contact him or her. Do not treat yourself for coughs, colds, or pain while you are using this medication without asking your health care provider for advice. Some medications may increase your blood pressure. You may get drowsy or dizzy. Do not drive, use machinery, or  do anything that needs mental alertness until you know how this medication affects you. Do not stand up or sit up quickly, especially if you are an older patient. This reduces the risk of dizzy or fainting spells. Alcohol can make you more drowsy and dizzy. Avoid alcoholic drinks. What side effects may I notice from receiving this medication? Side effects that you should report to your care team as soon as possible: Allergic reactions--skin rash, itching, hives, swelling of the face, lips, tongue, or throat Heart attack--pain or tightness in the chest, shoulders, arms, or jaw, nausea, shortness of breath, cold or clammy skin, feeling faint or lightheaded Low blood pressure--dizziness, feeling faint or lightheaded, blurry vision Side effects that usually do not require medical attention (report these to your care team if they continue or are bothersome): Facial flushing, redness Heart palpitations--rapid, pounding, or irregular heartbeat Nausea Stomach  pain Swelling of the ankles, hands, or feet This list may not describe all possible side effects. Call your doctor for medical advice about side effects. You may report side effects to FDA at 1-800-FDA-1088. Where should I keep my medication? Keep out of the reach of children and pets. Store at room temperature between 20 and 25 degrees C (68 and 77 degrees F). Protect from light and moisture. Keep the container tightly closed. Get rid of any unused medication after the expiration date. To get rid of medications that are no longer needed or have expired: Take the medication to a medication take-back program. Check with your pharmacy or law enforcement to find a location. If you cannot return the medication, check the label or package insert to see if the medication should be thrown out in the garbage or flushed down the toilet. If you are not sure, ask your health care provider. If it is safe to put in the trash, empty the medication out of the container. Mix the medication with cat litter, dirt, coffee grounds, or other unwanted substance. Seal the mixture in a bag or container. Put it in the trash. NOTE: This sheet is a summary. It may not cover all possible information. If you have questions about this medicine, talk to your doctor, pharmacist, or health care provider.  2022 Elsevier/Gold Standard (2021-07-07 00:00:00)

## 2021-09-11 ENCOUNTER — Other Ambulatory Visit: Payer: Self-pay | Admitting: Adult Health

## 2021-09-12 LAB — COMPLETE METABOLIC PANEL WITH GFR
AG Ratio: 1.5 (calc) (ref 1.0–2.5)
ALT: 8 U/L (ref 6–29)
AST: 16 U/L (ref 10–35)
Albumin: 4.1 g/dL (ref 3.6–5.1)
Alkaline phosphatase (APISO): 50 U/L (ref 37–153)
BUN: 16 mg/dL (ref 7–25)
CO2: 27 mmol/L (ref 20–32)
Calcium: 9 mg/dL (ref 8.6–10.4)
Chloride: 102 mmol/L (ref 98–110)
Creat: 0.53 mg/dL (ref 0.50–1.05)
Globulin: 2.8 g/dL (calc) (ref 1.9–3.7)
Glucose, Bld: 113 mg/dL — ABNORMAL HIGH (ref 65–99)
Potassium: 3.9 mmol/L (ref 3.5–5.3)
Sodium: 141 mmol/L (ref 135–146)
Total Bilirubin: 0.4 mg/dL (ref 0.2–1.2)
Total Protein: 6.9 g/dL (ref 6.1–8.1)
eGFR: 105 mL/min/{1.73_m2} (ref >=60)

## 2021-09-12 LAB — URINALYSIS, ROUTINE W REFLEX MICROSCOPIC
Bacteria, UA: NONE SEEN /HPF
Bilirubin Urine: NEGATIVE
Glucose, UA: NEGATIVE
Hgb urine dipstick: NEGATIVE
Hyaline Cast: NONE SEEN /LPF
Ketones, ur: NEGATIVE
Nitrite: NEGATIVE
Protein, ur: NEGATIVE
RBC / HPF: NONE SEEN /HPF (ref 0–2)
Specific Gravity, Urine: 1.014 (ref 1.001–1.035)
Squamous Epithelial / HPF: NONE SEEN /HPF (ref <=5)
pH: 5.5 (ref 5.0–8.0)

## 2021-09-12 LAB — CBC WITH DIFFERENTIAL/PLATELET
Absolute Monocytes: 345 cells/uL (ref 200–950)
Basophils Absolute: 28 cells/uL (ref 0–200)
Basophils Relative: 0.6 %
Eosinophils Absolute: 51 cells/uL (ref 15–500)
Eosinophils Relative: 1.1 %
HCT: 38.2 % (ref 35.0–45.0)
Hemoglobin: 12 g/dL (ref 11.7–15.5)
Lymphs Abs: 2548 cells/uL (ref 850–3900)
MCH: 25.5 pg — ABNORMAL LOW (ref 27.0–33.0)
MCHC: 31.4 g/dL — ABNORMAL LOW (ref 32.0–36.0)
MCV: 81.3 fL (ref 80.0–100.0)
MPV: 11.4 fL (ref 7.5–12.5)
Monocytes Relative: 7.5 %
Neutro Abs: 1628 cells/uL (ref 1500–7800)
Neutrophils Relative %: 35.4 %
Platelets: 376 10*3/uL (ref 140–400)
RBC: 4.7 10*6/uL (ref 3.80–5.10)
RDW: 13.4 % (ref 11.0–15.0)
Total Lymphocyte: 55.4 %
WBC: 4.6 10*3/uL (ref 3.8–10.8)

## 2021-09-12 LAB — LIPID PANEL
Cholesterol: 167 mg/dL (ref <200)
HDL: 101 mg/dL (ref >=50)
LDL Cholesterol (Calc): 54 mg/dL (calc)
Non-HDL Cholesterol (Calc): 66 mg/dL (calc) (ref <130)
Total CHOL/HDL Ratio: 1.7 (calc) (ref <5.0)
Triglycerides: 50 mg/dL (ref <150)

## 2021-09-12 LAB — HEMOGLOBIN A1C
Hgb A1c MFr Bld: 6.2 % of total Hgb — ABNORMAL HIGH (ref <5.7)
Mean Plasma Glucose: 131 mg/dL
eAG (mmol/L): 7.3 mmol/L

## 2021-09-12 LAB — MAGNESIUM: Magnesium: 1.9 mg/dL (ref 1.5–2.5)

## 2021-09-12 LAB — MICROALBUMIN / CREATININE URINE RATIO
Creatinine, Urine: 63 mg/dL (ref 20–275)
Microalb Creat Ratio: 14 mcg/mg creat (ref <30)
Microalb, Ur: 0.9 mg/dL

## 2021-09-12 LAB — VITAMIN D 25 HYDROXY (VIT D DEFICIENCY, FRACTURES): Vit D, 25-Hydroxy: 114 ng/mL — ABNORMAL HIGH (ref 30–100)

## 2021-09-12 LAB — VITAMIN B12: Vitamin B-12: 306 pg/mL (ref 200–1100)

## 2021-09-12 LAB — TSH: TSH: 0.36 mIU/L — ABNORMAL LOW (ref 0.40–4.50)

## 2021-10-02 ENCOUNTER — Other Ambulatory Visit: Payer: Self-pay | Admitting: Adult Health

## 2021-10-09 NOTE — Progress Notes (Signed)
Assessment and Plan:  Tanya [name] was seen today for acute visit and hypertension.  Diagnoses and all orders for this visit:  Primary hypertension Improved at home, persistent above goal in office Increase to whole tab amlodipine Home goal <130/80 Follow up to recheck NV in 1-2 weeks  Encounter for screening for COVID-19 -     POC COVID-19  Flu-like symptoms -     POCT Influenza A/B - neg Discussed the importance of avoiding unnecessary antibiotic therapy. Suggested symptomatic OTC remedies. Nasal saline spray for congestion. Nasal steroids, allergy pill, oral steroids offered Follow up as needed.   Further disposition pending results of labs. Discussed med's effects and SE's.   Over 30 minutes of exam, counseling, chart review, and critical decision making was performed.   Future Appointments  Date Time Provider Department Center  12/22/2021  9:30 AM Judd Gaudier, NP [facility] None  09/10/2022 10:00 AM Judd Gaudier, NP [facility] None    ------------------------------------------------------------------------------------------------------------------   HPI BP (!) 188/82   Pulse 90   Temp (!) 97.2 F (36.2 C)   Wt 199 lb 6.4 oz (90.4 kg)   SpO2 99%   BMI 34.77 kg/m  60 y.o.female presents for 1 month follow up on htn. She reported nasal congestion and postnasal drip, had negative covid 19 test today.   Hx of persistent htn; recently was taking ziac 10/6.25, lasix 40 mg BID, olmesartan 40 mg daily, clonidine 0.2 mg BID, hydralazine 25 mg TID without notable improvement. We stopped clonidine and added Amlodipine, she reports taking 1/2 of 2.5 mg daily and reports home values improved from 150s to high 130s-140s/70s. Never went up to whole tab.   Her blood pressure has been controlled at home, today their BP is BP: (!) 188/82  She does workout. She denies chest pain, shortness of breath, dizziness.   Past Medical History:  Diagnosis Date   Allergy    Diabetes  mellitus    Family history of adverse reaction to anesthesia    " My Mother would vomit "   Goiter    Hyperlipidemia    Hypertension    Morbid obesity (HCC) 12/24/2013   OSA (obstructive sleep apnea)    Palpitations 08/01/2016   Sarcoid    Sarcoidosis    SBO (small bowel obstruction) (HCC) 07/25/2020   Vitamin D deficiency      Allergies  Allergen Reactions   Amlodipine Swelling   Naproxen Hives   Meloxicam & Diet Manage Prod Rash    Current Outpatient Medications on File Prior to Visit  Medication Sig   amLODipine (NORVASC) 2.5 MG tablet TAKE 1 TAB DAILY IN THE EVENING FOR BLOOD PRESSURE. (Patient taking differently: Take 1/2 tab daily in the evening for blood pressure.)   aspirin EC 81 MG tablet Take 81 mg by mouth daily.   bisoprolol-hydrochlorothiazide (ZIAC) 10-6.25 MG tablet TAKE 1 TABLET DAILY FOR BLOOD PRESSURE   butalbital-acetaminophen-caffeine (FIORICET) 50-325-40 MG tablet Take        1 to 2 tablets    4 x  /day as needed for Pain or Headache   cloNIDine (CATAPRES) 0.2 MG tablet TAKE 1 TABLET BY MOUTH THREE TIMES A DAY WITH MEALS FOR BLOOD PRESSURE   Cyanocobalamin (VITAMIN B-12 SL) Place 1 tablet under the tongue daily.    diclofenac sodium (VOLTAREN) 1 % GEL Apply 2 g topically 4 (four) times daily. (Patient taking differently: Apply 2 g topically 4 (four) times daily as needed (pain).)   fluticasone (FLONASE) 50 MCG/ACT nasal spray  Place 2 sprays into both nostrils daily. (Patient taking differently: Place 2 sprays into both nostrils daily as needed for allergies.)   furosemide (LASIX) 40 MG tablet Take     1 tablet 2 x /day for BP & Fluid Retention / Ankle Swelling    TAKE 1 TABLET TWO TIMES A DAY FOR BLOOD PRESSURE AND FLUID RETENTION / ANKLE SWELLING   gabapentin (NEURONTIN) 100 MG capsule Take     1 capsule    3 x /day for Chronic Pain             TAKE 1 CAPSULE BY MOUTH 3 TIMES A DAY FOR PAIN   hydrALAZINE (APRESOLINE) 25 MG tablet Take  1 tablet  3 x  /day for BP  & Heart   Magnesium 250 MG TABS Take 250 mg by mouth daily.   metFORMIN (GLUCOPHAGE-XR) 500 MG 24 hr tablet Take  2 tablets  2 x /day with Meals for Diabetes                           /          TAKE 2 TABLETS TWICE X DAY   Multiple Vitamins-Minerals (MULTIVITAMIN PO) Take by mouth daily.   olmesartan (BENICAR) 40 MG tablet TAKE 1 TABLET DAILY FOR BP & DIABETIC KIDNEY PROTECTION   phentermine (ADIPEX-P) 37.5 MG tablet TAKE 1/2 TO 1 TABLET EVERY MORNING FOR DIETING & WEIGHTLOSS   RESTASIS 0.05 % ophthalmic emulsion Place 1 drop into both eyes 3 (three) times daily as needed for dry eyes.   rosuvastatin (CRESTOR) 5 MG tablet Take 1 tablet (5 mg total) by mouth once a week.   No current facility-administered medications on file prior to visit.    ROS: all negative except above.   Physical Exam:  BP (!) 188/82   Pulse 90   Temp (!) 97.2 F (36.2 C)   Wt 199 lb 6.4 oz (90.4 kg)   SpO2 99%   BMI 34.77 kg/m   General Appearance: Well nourished, in no apparent distress. Eyes: PERRLA, EOMs, conjunctiva no swelling or erythema Sinuses: No Frontal/maxillary tenderness ENT/Mouth: Ext aud canals clear, TMs without erythema, bulging. No erythema, swelling, or exudate on post pharynx.  Tonsils not swollen or erythematous. Hearing normal.  Neck: Supple, thyroid normal.  Respiratory: Respiratory effort normal, BS equal bilaterally without rales, rhonchi, wheezing or stridor.  Cardio: RRR with no MRGs. Brisk peripheral pulses without edema.  Abdomen: Soft, + BS.  Non tender, no guarding, rebound, hernias, masses. Lymphatics: Non tender without lymphadenopathy.  Musculoskeletal: Full ROM, 5/5 strength, normal gait.  Skin: Warm, dry without rashes, lesions, ecchymosis.  Neuro: Cranial nerves intact. Normal muscle tone, no cerebellar symptoms. Sensation intact.  Psych: Awake and oriented X 3, normal affect, Insight and Judgment appropriate.     Dan Maker, NP 4:29 PM [facility]

## 2021-10-12 ENCOUNTER — Encounter: Payer: Self-pay | Admitting: Adult Health

## 2021-10-13 ENCOUNTER — Ambulatory Visit (INDEPENDENT_AMBULATORY_CARE_PROVIDER_SITE_OTHER): Payer: BC Managed Care – PPO | Admitting: Adult Health

## 2021-10-13 ENCOUNTER — Encounter: Payer: Self-pay | Admitting: Adult Health

## 2021-10-13 ENCOUNTER — Other Ambulatory Visit: Payer: Self-pay

## 2021-10-13 VITALS — BP 174/76 | HR 90 | Temp 97.2°F | Wt 199.4 lb

## 2021-10-13 DIAGNOSIS — R6889 Other general symptoms and signs: Secondary | ICD-10-CM | POA: Diagnosis not present

## 2021-10-13 DIAGNOSIS — I10 Essential (primary) hypertension: Secondary | ICD-10-CM | POA: Diagnosis not present

## 2021-10-13 DIAGNOSIS — Z1152 Encounter for screening for COVID-19: Secondary | ICD-10-CM

## 2021-10-13 LAB — POCT INFLUENZA A/B
Influenza A, POC: NEGATIVE
Influenza B, POC: NEGATIVE

## 2021-10-13 LAB — POC COVID19 BINAXNOW: SARS Coronavirus 2 Ag: NEGATIVE

## 2021-10-13 NOTE — Patient Instructions (Signed)
° ° ° ° ° ° ° °  Increase amlodipine from 1/2 tab to whole     Medicines you can use  Nasal congestion Little Remedies saline spray (aerosol/mist)- can try this, it is in the kids section - pseudoephedrine (Sudafed)- behind the counter, do not use if you have high blood pressure, medicine that have -D in them. - phenylephrine (Sudafed PE) -Dextormethorphan + chlorpheniramine (Coridcidin HBP)- okay if you have high blood pressure -Oxymetazoline (Afrin) nasal spray- LIMIT to 3 days -Saline nasal spray -Neti pot (used distilled or bottled water)  Ear pain/congestion -pseudoephedrine (sudafed) - Nasonex/flonase nasal spray  Fever -Acetaminophen (Tyelnol) -Ibuprofen (Advil, motrin, aleve)  Sore Throat -Acetaminophen (Tyelnol) -Ibuprofen (Advil, motrin, aleve) -Drink a lot of water -Gargle with salt water - Rest your voice (don't talk) -Throat sprays -Cough drops  Body Aches -Acetaminophen (Tyelnol) -Ibuprofen (Advil, motrin, aleve)  Headache -Acetaminophen (Tyelnol) -Ibuprofen (Advil, motrin, aleve) - Exedrin, Exedrin Migraine  Allergy symptoms (cough, sneeze, runny nose, itchy eyes) -Claritin or loratadine cheapest but likely the weakest -Zyrtec or certizine at night because it can make you sleepy -The strongest is allegra or fexafinadine Cheapest at [employer], [employer], [employer]  Cough -Dextromethorphan (Delsym)- medicine that has DM in it -Guafenesin (Mucinex/Robitussin) - cough drops - drink lots of water  Chest Congestion -Guafenesin (Mucinex/Robitussin)  Red Itchy Eyes - Naphcon-A  Upset Stomach - Bland diet (nothing spicy, greasy, fried, and high acid foods like tomatoes, oranges, berries) -OKAY- cereal, bread, soup, crackers, rice -Eat smaller more frequent meals -reduce caffeine, no alcohol -Loperamide (Imodium-AD) if diarrhea -Prevacid for heart burn  General health when sick -Hydration -wash your hands frequently -keep surfaces clean -change  pillow cases and sheets often -Get fresh air but do not exercise strenuously -Vitamin D, double up on it - Vitamin C -Zinc

## 2021-10-26 ENCOUNTER — Other Ambulatory Visit: Payer: Self-pay | Admitting: Adult Health

## 2021-10-26 ENCOUNTER — Other Ambulatory Visit: Payer: Self-pay | Admitting: Internal Medicine

## 2021-10-27 ENCOUNTER — Ambulatory Visit (INDEPENDENT_AMBULATORY_CARE_PROVIDER_SITE_OTHER): Payer: BC Managed Care – PPO

## 2021-10-27 ENCOUNTER — Other Ambulatory Visit: Payer: Self-pay

## 2021-10-27 DIAGNOSIS — I10 Essential (primary) hypertension: Secondary | ICD-10-CM

## 2021-10-27 NOTE — Progress Notes (Signed)
Patient did not bring in blood pressure log today. She did report that her morning pressure today was 146/76 and yesterday's pressure was 150/70. She did not have any more recorded pressures, but did report that she is taking them and will continue to take them. She also reports that she is taking her medications as scheduled.

## 2021-11-20 ENCOUNTER — Other Ambulatory Visit: Payer: Self-pay | Admitting: Internal Medicine

## 2021-12-07 ENCOUNTER — Other Ambulatory Visit: Payer: Self-pay | Admitting: Adult Health Nurse Practitioner

## 2021-12-22 ENCOUNTER — Ambulatory Visit: Payer: BC Managed Care – PPO | Admitting: Adult Health

## 2022-01-19 DIAGNOSIS — J019 Acute sinusitis, unspecified: Secondary | ICD-10-CM | POA: Diagnosis not present

## 2022-01-19 DIAGNOSIS — I10 Essential (primary) hypertension: Secondary | ICD-10-CM | POA: Diagnosis not present

## 2022-01-26 NOTE — Progress Notes (Signed)
?FOLLOW UP ? ?Assessment and Plan:  ? ?Tortuous aorta ?Per CXR 07/2016 ?Control blood pressure, cholesterol, glucose, increase exercise.  ? ?Hypertension ?Continue on Amlodipine 2.5mg , Olmesartan 40 mg QD, hydralazine 25mg  TID, Ziac 10/6.25mg  daily.  ?Monitor blood pressure at home; patient to call if consistently greater than 130/80 ?Continue DASH diet.   ?Reminder to go to the ER if any CP, SOB, nausea, dizziness, severe HA, changes vision/speech, left arm numbness and tingling and jaw pain. ? ?Cholesterol ?Fairly managed by lifestyle and low dose low frequency statin per strong patient preference  ?Continue low cholesterol diet and exercise.  ?Check lipid panel.  ? ?Diabetes without complications ?Continue medication: metformin 2000 mg daily  ?Continue diet and exercise.  ?Perform daily foot/skin check, notify office of any concerning changes.  ?Check A1C ? ?Obesity with co morbidities -BMI 30+ with OSA, T2DM, hyperlipidemia, htn ?Commended excellent progress ?Long discussion about weight loss, diet, and exercise ?Recommended diet heavy in fruits and veggies and low in animal meats, cheeses, and dairy products, appropriate calorie intake ?Discussed ideal weight for height -  ?Patient will - continue exercise, low carb, watch portions, increase fluids, weighing once weekly ?Will follow up in 3 months ? ?Goiter ?Neg biopsy of nodule in 2012; stable on follow up 2013 12/2018 ?Check TSH ?Monitor  ? ?Vitamin D Def/ osteoporosis prevention ?Continue supplementation; above goal last visit - did stop for 1 month, reduced from twice a week to once weekly  ?Check Vit D level ? ?Seasonal Allergies ?Continue Zyrtec, Flonase and will start dexamethasone taper ?Monitor symptoms ? ? ?Continue diet and meds as discussed. Further disposition pending results of labs. Discussed med's effects and SE's.   ?Over 30 minutes of exam, counseling, chart review, and critical decision making was performed.  ? ?Future Appointments  ?Date Time  Provider Department Center  ?09/10/2022 10:00 AM 13/08/2022, NP [facility] None  ? ? ?---------------------------------------------------------------------------------------------------------------------- ? ?HPI ?61 y.o. female  presents for 3 month follow up on hypertension, cholesterol, T2DM, morbid obesity and vitamin D deficiency.  ? ? ?On 01/19/22  she was treated with cefdinir for productive cough of white mucus. She is also taking Zyrtec and Flonase.  Symptoms have improved but still having lots of post nasal drip and congestion ? ?In 2021 she had SBO and had resection in 07/2020, has done well since. ? ? ?BMI is Body mass index is 36.58 kg/m?., Has not been walking as much but started back with walking group 3 days ago.  She has recently started cutting back on carbs (potatoes and rice), more veggies. She is avoiding fried foods and cutting down on red meat. ?Weight is down from 236 lb in 11/2018.  ?Wt Readings from Last 3 Encounters:  ?01/27/22 209 lb 12.8 oz (95.2 kg)  ?10/27/21 197 lb (89.4 kg)  ?10/13/21 199 lb 6.4 oz (90.4 kg)  ? ?She has known tortuous aorta per CXR 07/2016 ? ?She does check BPs at home (130s/70s), Currently on Amlodipine 2.5mg , Olmesartan 40 mg QD, hydralazine 25mg  TID, Ziac 10/6.25mg  daily. Today their BP is BP: 138/82  ?BP Readings from Last 3 Encounters:  ?01/27/22 138/82  ?10/27/21 (!) 148/70  ?10/13/21 (!) 174/76  ? ? ? She does currently work out.  She denies chest pain, shortness of breath, dizziness. ? ? She is on cholesterol medication (rosuvatatin 5 mg once weekly). Her cholesterol is not at goal of LDL <70. The cholesterol last visit was:   ?Lab Results  ?Component Value Date  ? CHOL  167 09/10/2021  ? HDL 101 09/10/2021  ? LDLCALC 54 09/10/2021  ? TRIG 50 09/10/2021  ? CHOLHDL 1.7 09/10/2021  ? ? She has been working on diet and exercise for T2 diabetes on metformin 1000 mg BID, and denies increased appetite, nausea, paresthesia of the feet, polydipsia, polyuria, visual  disturbances and vomiting  ?Reports fasting 89-100's.  ?Last A1C in the office was:  ?Lab Results  ?Component Value Date  ? HGBA1C 6.2 (H) 09/10/2021  ? ?Lab Results  ?Component Value Date  ? GFRAA 120 01/14/2021  ? ?She has non-neoplastic goiter with R lobe nodule (neg biopsy 2012) that we are monitoring, thyroid US in 12/2018 showed nodule unchanged from previous:     ?Lab Results  ?Component Value Date  ? TSH 0.36 (L) 09/10/2021  ? ?Patient is on Vitamin D supplement and above goal at the last check: She has decreased Vit D3 to 2000 units daily ?Lab Results  ?Component Value Date  ? VD25OH 114 (H) 09/10/2021  ? She reports she did stop taking 1610950000 IU weekly for 1 month as instructed, then started 4000 IU daily.  ? ?Current Medications:  ?Current Outpatient Medications on File Prior to Visit  ?Medication Sig  ? amLODipine (NORVASC) 2.5 MG tablet Take 1 tab every night  for  BP  ? aspirin EC 81 MG tablet Take 81 mg by mouth daily.  ? bisoprolol-hydrochlorothiazide (ZIAC) 10-6.25 MG tablet TAKE 1 TABLET DAILY FOR BLOOD PRESSURE  ? butalbital-acetaminophen-caffeine (FIORICET) 50-325-40 MG tablet Take        1 to 2 tablets    4 x  /day as needed for Pain or Headache  ? cefdinir (OMNICEF) 300 MG capsule Take 300 mg by mouth 2 (two) times daily.  ? Cyanocobalamin (VITAMIN B-12 SL) Place 1 tablet under the tongue daily.   ? diclofenac sodium (VOLTAREN) 1 % GEL Apply 2 g topically 4 (four) times daily. (Patient taking differently: Apply 2 g topically 4 (four) times daily as needed (pain).)  ? fluticasone (FLONASE) 50 MCG/ACT nasal spray Place 2 sprays into both nostrils daily. (Patient taking differently: Place 2 sprays into both nostrils daily as needed for allergies.)  ? furosemide (LASIX) 40 MG tablet Take  1 tablet  2 x /day  for BP & Fluid Retention / Ankle Swelling                                                      /                 TAKE 1 TABLET BY MOUTH  ? gabapentin (NEURONTIN) 100 MG capsule Take     1 capsule     3 x /day for Chronic Pain             TAKE 1 CAPSULE BY MOUTH 3 TIMES A DAY FOR PAIN  ? hydrALAZINE (APRESOLINE) 25 MG tablet Take  1 tablet  3 x  /day for BP & Heart  ? Magnesium 250 MG TABS Take 250 mg by mouth daily.  ? metFORMIN (GLUCOPHAGE-XR) 500 MG 24 hr tablet Take  2 tablets  2 x /day with Meals for Diabetes                           /  TAKE 2 TABLETS TWICE X DAY  ? Multiple Vitamins-Minerals (MULTIVITAMIN PO) Take by mouth daily.  ? olmesartan (BENICAR) 40 MG tablet TAKE 1 TABLET DAILY FOR BP & DIABETIC KIDNEY PROTECTION  ? RESTASIS 0.05 % ophthalmic emulsion Place 1 drop into both eyes 3 (three) times daily as needed for dry eyes.  ? rosuvastatin (CRESTOR) 5 MG tablet Take 1 tablet (5 mg total) by mouth once a week.  ? ?No current facility-administered medications on file prior to visit.  ? ? ? ?Allergies:  ?Allergies  ?Allergen Reactions  ? Amlodipine Swelling  ? Naproxen Hives  ? Meloxicam & Diet Manage Prod Rash  ?  ? ?Medical History:  ?Past Medical History:  ?Diagnosis Date  ? Allergy   ? Diabetes mellitus   ? Family history of adverse reaction to anesthesia   ? " My Mother would vomit "  ? Goiter   ? Hyperlipidemia   ? Hypertension   ? Morbid obesity (HCC) 12/24/2013  ? OSA (obstructive sleep apnea)   ? Palpitations 08/01/2016  ? Sarcoid   ? Sarcoidosis   ? SBO (small bowel obstruction) (HCC) 07/25/2020  ? Vitamin D deficiency   ? ?Family history- Reviewed and unchanged ?Social history- Reviewed and unchanged ? ? ?Review of Systems:  ?Review of Systems  ?Constitutional:  Negative for malaise/fatigue and weight loss.  ?HENT:  Positive for congestion. Negative for hearing loss and tinnitus.   ?     Postnasal drip  ?Eyes:  Negative for blurred vision and double vision.  ?Respiratory:  Negative for cough, shortness of breath and wheezing.   ?Cardiovascular:  Negative for chest pain, palpitations, orthopnea, claudication and leg swelling.  ?Gastrointestinal:  Negative for abdominal pain, blood  in stool, constipation, diarrhea, heartburn, melena, nausea and vomiting.  ?Genitourinary: Negative.   ?Musculoskeletal:  Negative for back pain, joint pain and myalgias.  ?Skin:  Negative for rash.  ?Ne

## 2022-01-27 ENCOUNTER — Ambulatory Visit (INDEPENDENT_AMBULATORY_CARE_PROVIDER_SITE_OTHER): Payer: BC Managed Care – PPO | Admitting: Nurse Practitioner

## 2022-01-27 ENCOUNTER — Other Ambulatory Visit: Payer: Self-pay

## 2022-01-27 ENCOUNTER — Encounter: Payer: Self-pay | Admitting: Nurse Practitioner

## 2022-01-27 VITALS — BP 138/82 | HR 73 | Temp 97.6°F | Wt 209.8 lb

## 2022-01-27 DIAGNOSIS — I10 Essential (primary) hypertension: Secondary | ICD-10-CM | POA: Diagnosis not present

## 2022-01-27 DIAGNOSIS — E559 Vitamin D deficiency, unspecified: Secondary | ICD-10-CM

## 2022-01-27 DIAGNOSIS — E662 Morbid (severe) obesity with alveolar hypoventilation: Secondary | ICD-10-CM

## 2022-01-27 DIAGNOSIS — I771 Stricture of artery: Secondary | ICD-10-CM

## 2022-01-27 DIAGNOSIS — E1169 Type 2 diabetes mellitus with other specified complication: Secondary | ICD-10-CM | POA: Diagnosis not present

## 2022-01-27 DIAGNOSIS — E785 Hyperlipidemia, unspecified: Secondary | ICD-10-CM | POA: Diagnosis not present

## 2022-01-27 DIAGNOSIS — Z79899 Other long term (current) drug therapy: Secondary | ICD-10-CM

## 2022-01-27 DIAGNOSIS — G4733 Obstructive sleep apnea (adult) (pediatric): Secondary | ICD-10-CM

## 2022-01-27 DIAGNOSIS — E049 Nontoxic goiter, unspecified: Secondary | ICD-10-CM | POA: Diagnosis not present

## 2022-01-27 DIAGNOSIS — E669 Obesity, unspecified: Secondary | ICD-10-CM

## 2022-01-27 DIAGNOSIS — J302 Other seasonal allergic rhinitis: Secondary | ICD-10-CM

## 2022-01-27 MED ORDER — DEXAMETHASONE 1 MG PO TABS: ORAL_TABLET | ORAL | 0 refills | Status: DC

## 2022-01-28 LAB — CBC WITH DIFFERENTIAL/PLATELET
Absolute Monocytes: 512 cells/uL (ref 200–950)
Basophils Absolute: 38 cells/uL (ref 0–200)
Basophils Relative: 0.6 %
Eosinophils Absolute: 70 cells/uL (ref 15–500)
Eosinophils Relative: 1.1 %
HCT: 36.5 % (ref 35.0–45.0)
Hemoglobin: 11.4 g/dL — ABNORMAL LOW (ref 11.7–15.5)
Lymphs Abs: 2848 cells/uL (ref 850–3900)
MCH: 25.4 pg — ABNORMAL LOW (ref 27.0–33.0)
MCHC: 31.2 g/dL — ABNORMAL LOW (ref 32.0–36.0)
MCV: 81.3 fL (ref 80.0–100.0)
MPV: 10.4 fL (ref 7.5–12.5)
Monocytes Relative: 8 %
Neutro Abs: 2931 cells/uL (ref 1500–7800)
Neutrophils Relative %: 45.8 %
Platelets: 429 10*3/uL — ABNORMAL HIGH (ref 140–400)
RBC: 4.49 10*6/uL (ref 3.80–5.10)
RDW: 13.7 % (ref 11.0–15.0)
Total Lymphocyte: 44.5 %
WBC: 6.4 10*3/uL (ref 3.8–10.8)

## 2022-01-28 LAB — LIPID PANEL
Cholesterol: 186 mg/dL (ref <200)
HDL: 113 mg/dL (ref >=50)
LDL Cholesterol (Calc): 58 mg/dL (calc)
Non-HDL Cholesterol (Calc): 73 mg/dL (calc) (ref <130)
Total CHOL/HDL Ratio: 1.6 (calc) (ref <5.0)
Triglycerides: 70 mg/dL (ref <150)

## 2022-01-28 LAB — COMPLETE METABOLIC PANEL WITH GFR
AG Ratio: 1.7 (calc) (ref 1.0–2.5)
ALT: 14 U/L (ref 6–29)
AST: 15 U/L (ref 10–35)
Albumin: 4 g/dL (ref 3.6–5.1)
Alkaline phosphatase (APISO): 63 U/L (ref 37–153)
BUN: 19 mg/dL (ref 7–25)
CO2: 31 mmol/L (ref 20–32)
Calcium: 9.4 mg/dL (ref 8.6–10.4)
Chloride: 102 mmol/L (ref 98–110)
Creat: 0.6 mg/dL (ref 0.50–1.05)
Globulin: 2.4 g/dL (calc) (ref 1.9–3.7)
Glucose, Bld: 113 mg/dL — ABNORMAL HIGH (ref 65–99)
Potassium: 4.6 mmol/L (ref 3.5–5.3)
Sodium: 140 mmol/L (ref 135–146)
Total Bilirubin: 0.3 mg/dL (ref 0.2–1.2)
Total Protein: 6.4 g/dL (ref 6.1–8.1)
eGFR: 102 mL/min/{1.73_m2} (ref >=60)

## 2022-01-28 LAB — HEMOGLOBIN A1C
Hgb A1c MFr Bld: 6.3 % of total Hgb — ABNORMAL HIGH (ref <5.7)
Mean Plasma Glucose: 134 mg/dL
eAG (mmol/L): 7.4 mmol/L

## 2022-03-03 ENCOUNTER — Other Ambulatory Visit: Payer: Self-pay | Admitting: Adult Health

## 2022-03-03 DIAGNOSIS — I10 Essential (primary) hypertension: Secondary | ICD-10-CM

## 2022-04-01 ENCOUNTER — Other Ambulatory Visit: Payer: Self-pay | Admitting: Internal Medicine

## 2022-04-01 ENCOUNTER — Other Ambulatory Visit: Payer: Self-pay | Admitting: Adult Health

## 2022-04-01 DIAGNOSIS — E1169 Type 2 diabetes mellitus with other specified complication: Secondary | ICD-10-CM

## 2022-04-28 NOTE — Progress Notes (Deleted)
FOLLOW UP  Assessment and Plan:   Tortuous aorta Per CXR 07/2016 Control blood pressure, cholesterol, glucose, increase exercise.   Hypertension Improved on olmesartan 40 mg daily, continue other medicaitons Monitor blood pressure at home; patient to call if consistently greater than 130/80 Continue DASH diet.   Reminder to go to the ER if any CP, SOB, nausea, dizziness, severe HA, changes vision/speech, left arm numbness and tingling and jaw pain.  Cholesterol Fairly managed by lifestyle and low dose low frequency statin per strong patient preference  Continue low cholesterol diet and exercise.  Check lipid panel.   Diabetes without complications Continue medication: metformin 2000 mg daily  Continue diet and exercise.  Perform daily foot/skin check, notify office of any concerning changes.  Check A1C  Obesity with co morbidities -BMI 30+ with OSA, T2DM, hyperlipidemia, htn Commended excellent progress Long discussion about weight loss, diet, and exercise Recommended diet heavy in fruits and veggies and low in animal meats, cheeses, and dairy products, appropriate calorie intake Discussed ideal weight for height -  Patient will - continue exercise, low carb, watch portions, increase fluids, weighing once weekly Patient on phentermine with benefit and no SE, taking drug breaks; continue close follow up.  Will follow up in 3 months  Goiter Neg biopsy of nodule in 2012; stable on follow up US 12/2018 Check TSH Monitor   Vitamin D Def/ osteoporosis prevention Continue supplementation; above goal last visit - did stop for 1 month, reduced from twice a week to once weekly  Check Vit D level  OSA Needs new machine, repeat study; will refer to Dr. Vickey Huger   Continue diet and meds as discussed. Further disposition pending results of labs. Discussed med's effects and SE's.   Over 30 minutes of exam, counseling, chart review, and critical decision making was performed.    Future Appointments  Date Time Provider Department Center  05/03/2022  9:30 AM Raynelle Dick, NP [facility] None  09/10/2022 10:00 AM Adela Glimpse, NP [facility] None    ----------------------------------------------------------------------------------------------------------------------  HPI 61 y.o. female  presents for 3 month follow up on hypertension, cholesterol, T2DM, morbid obesity and vitamin D deficiency.   She has hx of OSA, previously on CPAP but needs new machine, last sleep study remote and not in system, unsure where she got this done. Requesting referral for follow up and new machine if needed.   Last year year she had SBO and had resection in 07/2020, has done well since.  She has had unexplained RLE edema in recent months; had negative DVT and venous reflux studies 10/2020; unremarkable xrays by podiatry; she reports has been walking more, compression and elevating and improved.   BMI is There is no height or weight on file to calculate BMI., she has been walking 3 miles/day in a group (60 min). She has recently started cutting back on carbs (potatoes and rice), more veggies. She is avoiding fried foods and cutting down on red meat. Weight is down from 236 lb in 11/2018.  Goal is to get off of some medications. She uses phentermine intermittently, 1/2 tab only with benefit, hasn't taken recently but plans to restart. Denies SE.  Wt Readings from Last 3 Encounters:  01/27/22 209 lb 12.8 oz (95.2 kg)  10/27/21 197 lb (89.4 kg)  10/13/21 199 lb 6.4 oz (90.4 kg)   She has known tortuous aorta per CXR 07/2016  She does check BPs at home (120-130s/70s), today their BP is    She does currently work  out.  She denies chest pain, shortness of breath, dizziness.   She is on cholesterol medication (rosuvatatin 5 mg once weekly). Her cholesterol is not at goal of LDL <70. The cholesterol last visit was:   Lab Results  Component Value Date   CHOL 186 01/27/2022   HDL 113  01/27/2022   LDLCALC 58 01/27/2022   TRIG 70 01/27/2022   CHOLHDL 1.6 01/27/2022    She has been working on diet and exercise for T2 diabetes on metformin 1000 mg BID, and denies increased appetite, nausea, paresthesia of the feet, polydipsia, polyuria, visual disturbances and vomiting  Reports fasting 89-120.  Last A1C in the office was:  Lab Results  Component Value Date   HGBA1C 6.3 (H) 01/27/2022   Lab Results  Component Value Date   GFRAA 120 01/14/2021   She has non-neoplastic goiter with R lobe nodule (neg biopsy 2012) that we are monitoring, thyroid US in 12/2018 showed nodule unchanged from previous:     Lab Results  Component Value Date   TSH 0.36 (L) 09/10/2021   Patient is on Vitamin D supplement and above goal at the last check:  Lab Results  Component Value Date   VD25OH 114 (H) 09/10/2021   She reports she did stop taking 27741 IU weekly for 1 month as instructed, then started 4000 IU daily.   Current Medications:  Current Outpatient Medications on File Prior to Visit  Medication Sig   amLODipine (NORVASC) 2.5 MG tablet Take 1 tab every night  for  BP   aspirin EC 81 MG tablet Take 81 mg by mouth daily.   bisoprolol-hydrochlorothiazide (ZIAC) 10-6.25 MG tablet TAKE 1 TABLET DAILY FOR BLOOD PRESSURE   butalbital-acetaminophen-caffeine (FIORICET) 50-325-40 MG tablet Take        1 to 2 tablets    4 x  /day as needed for Pain or Headache   cefdinir (OMNICEF) 300 MG capsule Take 300 mg by mouth 2 (two) times daily.   Cyanocobalamin (VITAMIN B-12 SL) Place 1 tablet under the tongue daily.    dexamethasone (DECADRON) 1 MG tablet Take 3 tabs for 3 days, 2 tabs for 3 days 1 tab for 5 days. Take with food.   diclofenac sodium (VOLTAREN) 1 % GEL Apply 2 g topically 4 (four) times daily. (Patient taking differently: Apply 2 g topically 4 (four) times daily as needed (pain).)   fluticasone (FLONASE) 50 MCG/ACT nasal spray Place 2 sprays into both nostrils daily. (Patient taking  differently: Place 2 sprays into both nostrils daily as needed for allergies.)   furosemide (LASIX) 40 MG tablet Take  1 tablet  2 x /day  for BP & Fluid Retention / Ankle Swelling                                                      /                 TAKE 1 TABLET BY MOUTH   gabapentin (NEURONTIN) 100 MG capsule Take     1 capsule    3 x /day for Chronic Pain             TAKE 1 CAPSULE BY MOUTH 3 TIMES A DAY FOR PAIN   hydrALAZINE (APRESOLINE) 25 MG tablet Take  1 tablet  3 x  /day  for BP & Heart   Magnesium 250 MG TABS Take 250 mg by mouth daily.   metFORMIN (GLUCOPHAGE-XR) 500 MG 24 hr tablet Take  2 tablets  2 x /day with Meals for Diabetes                           /          TAKE 2 TABLETS TWICE X DAY   Multiple Vitamins-Minerals (MULTIVITAMIN PO) Take by mouth daily.   olmesartan (BENICAR) 40 MG tablet TAKE 1 TABLET DAILY FOR BP & DIABETIC KIDNEY PROTECTION   RESTASIS 0.05 % ophthalmic emulsion Place 1 drop into both eyes 3 (three) times daily as needed for dry eyes.   rosuvastatin (CRESTOR) 5 MG tablet TAKE 1 TABLET BY MOUTH ONCE A WEEK.   No current facility-administered medications on file prior to visit.     Allergies:  Allergies  Allergen Reactions   Amlodipine Swelling   Naproxen Hives   Meloxicam & Diet Manage Prod Rash     Medical History:  Past Medical History:  Diagnosis Date   Allergy    Diabetes mellitus    Family history of adverse reaction to anesthesia    " My Mother would vomit "   Goiter    Hyperlipidemia    Hypertension    Morbid obesity (HCC) 12/24/2013   OSA (obstructive sleep apnea)    Palpitations 08/01/2016   Sarcoid    Sarcoidosis    SBO (small bowel obstruction) (HCC) 07/25/2020   Vitamin D deficiency    Family history- Reviewed and unchanged Social history- Reviewed and unchanged   Review of Systems:  Review of Systems  Constitutional:  Negative for malaise/fatigue and weight loss.  HENT:  Negative for hearing loss and tinnitus.   Eyes:   Negative for blurred vision and double vision.  Respiratory:  Negative for cough, shortness of breath and wheezing.   Cardiovascular:  Negative for chest pain, palpitations, orthopnea, claudication and leg swelling.  Gastrointestinal:  Negative for abdominal pain, blood in stool, constipation, diarrhea, heartburn, melena, nausea and vomiting.  Genitourinary: Negative.   Musculoskeletal:  Negative for back pain, joint pain and myalgias.  Skin:  Negative for rash.  Neurological:  Negative for dizziness, tingling, sensory change, weakness and headaches.  Endo/Heme/Allergies:  Negative for polydipsia.  Psychiatric/Behavioral: Negative.    All other systems reviewed and are negative.   Physical Exam: There were no vitals taken for this visit. Wt Readings from Last 3 Encounters:  01/27/22 209 lb 12.8 oz (95.2 kg)  10/27/21 197 lb (89.4 kg)  10/13/21 199 lb 6.4 oz (90.4 kg)   General Appearance: Well nourished, in no apparent distress. Eyes: PERRLA, EOMs, conjunctiva no swelling or erythema Sinuses: No Frontal/maxillary tenderness ENT/Mouth: Ext aud canals clear, TMs without erythema, bulging. No erythema, swelling, or exudate on post pharynx.  Tonsils not swollen or erythematous. Hearing normal.  Neck: Supple, thyroid normal.  Respiratory: Respiratory effort normal, BS equal bilaterally without rales, rhonchi, wheezing or stridor.  Cardio: RRR with no MRGs. Brisk peripheral pulses with scant non-pitting edema R ankle/pedal.  Abdomen: Soft, + BS.  Non tender, no guarding, rebound, hernias, masses. Lymphatics: Non tender without lymphadenopathy.  Musculoskeletal: Full ROM, 5/5 strength, Normal gait.  Skin: Warm, dry without rashes, lesions, ecchymosis.  Neuro: Cranial nerves intact. No cerebellar symptoms.  Psych: Awake and oriented X 3, normal affect, Insight and Judgment appropriate.    Raynelle Dick, NP 1:37  PM [facility]

## 2022-05-03 ENCOUNTER — Ambulatory Visit: Payer: BC Managed Care – PPO | Admitting: Nurse Practitioner

## 2022-05-03 DIAGNOSIS — E1169 Type 2 diabetes mellitus with other specified complication: Secondary | ICD-10-CM

## 2022-05-03 DIAGNOSIS — E559 Vitamin D deficiency, unspecified: Secondary | ICD-10-CM

## 2022-05-03 DIAGNOSIS — R7989 Other specified abnormal findings of blood chemistry: Secondary | ICD-10-CM

## 2022-05-03 DIAGNOSIS — G4733 Obstructive sleep apnea (adult) (pediatric): Secondary | ICD-10-CM

## 2022-05-03 DIAGNOSIS — I10 Essential (primary) hypertension: Secondary | ICD-10-CM

## 2022-05-03 DIAGNOSIS — E049 Nontoxic goiter, unspecified: Secondary | ICD-10-CM

## 2022-05-03 DIAGNOSIS — E669 Obesity, unspecified: Secondary | ICD-10-CM

## 2022-05-03 DIAGNOSIS — I771 Stricture of artery: Secondary | ICD-10-CM

## 2022-05-11 ENCOUNTER — Other Ambulatory Visit: Payer: Self-pay | Admitting: Internal Medicine

## 2022-05-11 DIAGNOSIS — G8929 Other chronic pain: Secondary | ICD-10-CM

## 2022-05-11 DIAGNOSIS — G5711 Meralgia paresthetica, right lower limb: Secondary | ICD-10-CM

## 2022-06-08 NOTE — Progress Notes (Unsigned)
FOLLOW UP  Assessment and Plan:   Tortuous aorta Per CXR 07/2016 Control blood pressure, cholesterol, glucose, increase exercise.   Hypertension Continue on Amlodipine 2.5mg , Olmesartan 40 mg QD, hydralazine 25mg  TID, Ziac 10/6.25mg  daily.  Monitor blood pressure at home; patient to call if consistently greater than 130/80 Continue DASH diet.   Reminder to go to the ER if any CP, SOB, nausea, dizziness, severe HA, changes vision/speech, left arm numbness and tingling and jaw pain. CBC, CMP  Hyperlipidemia associated with Ty[e 2 Diabetes Mellitus Fairly managed by lifestyle and low dose low frequency statin per strong patient preference  Continue low cholesterol diet and exercise.  Check lipid panel.   Diabetes without complications Continue medication: metformin 2000 mg daily  Continue diet and exercise.  Perform daily foot/skin check, notify office of any concerning changes.  Check A1C  Obesity with co morbidities -BMI 30+ with OSA, T2DM, hyperlipidemia, htn Commended excellent progress Long discussion about weight loss, diet, and exercise Recommended diet heavy in fruits and veggies and low in animal meats, cheeses, and dairy products, appropriate calorie intake Discussed ideal weight for height -  Patient will - continue exercise, low carb, watch portions, increase fluids, weighing once weekly Will follow up in 3 months  Goiter Neg biopsy of nodule in 2012; stable on follow up 2013 12/2018 Check TSH Monitor   Edema of right foot Wear compression socks Elevate foot above heart Increase water and limit salt  Vitamin D Def/ osteoporosis prevention Continue supplementation; above goal last visit - did stop for 1 month, reduced from twice a week to once weekly  Check Vit D level  Seasonal Allergies Continue Zyrtec, Flonase and will start dexamethasone taper Monitor symptoms  Medication Management Continued  Continue diet and meds as discussed. Further disposition  pending results of labs. Discussed med's effects and SE's.   Over 30 minutes of exam, counseling, chart review, and critical decision making was performed.   Future Appointments  Date Time Provider Department Center  09/10/2022 10:00 AM 13/08/2022, NP [facility] None    ----------------------------------------------------------------------------------------------------------------------  HPI 61 y.o. female  presents for 3 month follow up on hypertension, cholesterol, T2DM, morbid obesity and vitamin D deficiency.   She has been experiencing a tingling sensation on top of right foot when pressure is applied.  She has had more swelling noted in the right foot  In 2021 she had SBO and had resection in 07/2020, has done well since.   BMI is Body mass index is 36.93 kg/m., Has not been walking as much but started back with walking group 5 days a week for an hour. She is limiting simple carbs, she is drinking lots of water.  Weight is down from 236 lb in 11/2018.  Wt Readings from Last 3 Encounters:  06/09/22 211 lb 12.8 oz (96.1 kg)  01/27/22 209 lb 12.8 oz (95.2 kg)  10/27/21 197 lb (89.4 kg)   She has known tortuous aorta per CXR 07/2016  She does check BPs at home (130s/70s), Currently on Amlodipine 2.5mg , Olmesartan 40 mg QD, hydralazine 25mg  TID, Ziac 10/6.25mg  daily. Today their BP is BP: (!) 140/70  BP Readings from Last 3 Encounters:  06/09/22 (!) 140/70  01/27/22 138/82  10/27/21 (!) 148/70     She does currently work out.  She denies chest pain, shortness of breath, dizziness.   She is on cholesterol medication (rosuvatatin 5 mg once weekly). Her cholesterol is not at goal of LDL <70. The cholesterol last visit was:  Lab Results  Component Value Date   CHOL 186 01/27/2022   HDL 113 01/27/2022   LDLCALC 58 01/27/2022   TRIG 70 01/27/2022   CHOLHDL 1.6 01/27/2022    She has been working on diet and exercise for T2 diabetes on metformin 1000 mg BID, and denies  increased appetite, nausea, paresthesia of the feet, polydipsia, polyuria, visual disturbances and vomiting  Reports fasting 89-110 Last A1C in the office was:  Lab Results  Component Value Date   HGBA1C 6.3 (H) 01/27/2022   Lab Results  Component Value Date   GFRAA 120 01/14/2021   She has non-neoplastic goiter with R lobe nodule (neg biopsy 2012) that we are monitoring, thyroid US in 12/2018 showed nodule unchanged from previous:     Lab Results  Component Value Date   TSH 0.36 (L) 09/10/2021   Patient is on Vitamin D supplement and above goal at the last check: She has decreased Vit D3 to 2000 units daily Lab Results  Component Value Date   VD25OH 114 (H) 09/10/2021   She reports she did stop taking 79480 IU weekly for 1 month as instructed, then started 4000 IU daily.   Current Medications:  Current Outpatient Medications on File Prior to Visit  Medication Sig   amLODipine (NORVASC) 2.5 MG tablet Take 1 tab every night  for  BP   aspirin EC 81 MG tablet Take 81 mg by mouth daily.   bisoprolol-hydrochlorothiazide (ZIAC) 10-6.25 MG tablet TAKE 1 TABLET DAILY FOR BLOOD PRESSURE   butalbital-acetaminophen-caffeine (FIORICET) 50-325-40 MG tablet Take        1 to 2 tablets    4 x  /day as needed for Pain or Headache   Cyanocobalamin (VITAMIN B-12 SL) Place 1 tablet under the tongue daily.    fluticasone (FLONASE) 50 MCG/ACT nasal spray Place 2 sprays into both nostrils daily. (Patient taking differently: Place 2 sprays into both nostrils daily as needed for allergies.)   furosemide (LASIX) 40 MG tablet Take  1 tablet  2 x /day  for BP & Fluid Retention / Ankle Swelling                                                      /                 TAKE 1 TABLET BY MOUTH   gabapentin (NEURONTIN) 100 MG capsule TAKE 1 CAPSULE 3 TIMES A DAY FOR CHRONIC PAIN   hydrALAZINE (APRESOLINE) 25 MG tablet Take  1 tablet  3 x  /day for BP & Heart   Magnesium 250 MG TABS Take 250 mg by mouth daily.    metFORMIN (GLUCOPHAGE-XR) 500 MG 24 hr tablet Take  2 tablets  2 x /day with Meals for Diabetes                           /          TAKE 2 TABLETS TWICE X DAY   Multiple Vitamins-Minerals (MULTIVITAMIN PO) Take by mouth daily.   olmesartan (BENICAR) 40 MG tablet TAKE 1 TABLET DAILY FOR BP & DIABETIC KIDNEY PROTECTION   RESTASIS 0.05 % ophthalmic emulsion Place 1 drop into both eyes 3 (three) times daily as needed for dry eyes.   rosuvastatin (CRESTOR)  5 MG tablet TAKE 1 TABLET BY MOUTH ONCE A WEEK.   diclofenac sodium (VOLTAREN) 1 % GEL Apply 2 g topically 4 (four) times daily. (Patient not taking: Reported on 06/09/2022)   No current facility-administered medications on file prior to visit.     Allergies:  Allergies  Allergen Reactions   Amlodipine Swelling   Naproxen Hives   Meloxicam & Diet Manage Prod Rash     Medical History:  Past Medical History:  Diagnosis Date   Allergy    Diabetes mellitus    Family history of adverse reaction to anesthesia    " My Mother would vomit "   Goiter    Hyperlipidemia    Hypertension    Morbid obesity (HCC) 12/24/2013   OSA (obstructive sleep apnea)    Palpitations 08/01/2016   Sarcoid    Sarcoidosis    SBO (small bowel obstruction) (HCC) 07/25/2020   Vitamin D deficiency    Family history- Reviewed and unchanged Social history- Reviewed and unchanged   Review of Systems:  Review of Systems  Constitutional:  Negative for malaise/fatigue and weight loss.  HENT:  Negative for hearing loss and tinnitus.   Eyes:  Negative for blurred vision and double vision.  Respiratory:  Negative for cough, shortness of breath and wheezing.   Cardiovascular:  Positive for leg swelling (right foot). Negative for chest pain, palpitations, orthopnea and claudication.  Gastrointestinal:  Negative for abdominal pain, blood in stool, constipation, diarrhea, heartburn, melena, nausea and vomiting.  Genitourinary: Negative.   Musculoskeletal:  Negative for  back pain, joint pain and myalgias.  Skin:  Negative for rash.  Neurological:  Negative for dizziness, tingling, sensory change, weakness and headaches.  Endo/Heme/Allergies:  Negative for polydipsia.  Psychiatric/Behavioral: Negative.    All other systems reviewed and are negative.   Physical Exam: BP (!) 140/70   Pulse (!) 58   Temp (!) 97.5 F (36.4 C)   Ht 5' 3.5" (1.613 m)   Wt 211 lb 12.8 oz (96.1 kg)   SpO2 97%   BMI 36.93 kg/m  Wt Readings from Last 3 Encounters:  06/09/22 211 lb 12.8 oz (96.1 kg)  01/27/22 209 lb 12.8 oz (95.2 kg)  10/27/21 197 lb (89.4 kg)   General Appearance: Well nourished, in no apparent distress. Eyes: PERRLA, EOMs, conjunctiva no swelling or erythema Sinuses: No Frontal/maxillary tenderness ENT/Mouth: Ext aud canals clear, TMs without erythema, bulging. No erythema, swelling, or exudate on post pharynx. Clear post nasal drip noticed.  Tonsils not swollen or erythematous. Hearing normal.  Neck: Supple, thyroid normal.  Respiratory: Respiratory effort normal, BS equal bilaterally without rales, rhonchi, wheezing or stridor.  Cardio: RRR with no MRGs. Brisk peripheral pulses with 1 + pitting edema R foot Abdomen: Soft, + BS.  Non tender, no guarding, rebound, hernias, masses. Lymphatics: Non tender without lymphadenopathy.  Musculoskeletal: Full ROM, 5/5 strength, Normal gait.  Skin: Warm, dry without rashes, lesions, ecchymosis.  Neuro: Cranial nerves intact. No cerebellar symptoms.  Psych: Awake and oriented X 3, normal affect, Insight and Judgment appropriate.    Raynelle Dick, NP 11:28 AM [facility]

## 2022-06-09 ENCOUNTER — Encounter: Payer: Self-pay | Admitting: Nurse Practitioner

## 2022-06-09 ENCOUNTER — Ambulatory Visit: Payer: BC Managed Care – PPO | Admitting: Nurse Practitioner

## 2022-06-09 VITALS — BP 140/70 | HR 58 | Temp 97.5°F | Ht 63.5 in | Wt 211.8 lb

## 2022-06-09 DIAGNOSIS — E1169 Type 2 diabetes mellitus with other specified complication: Secondary | ICD-10-CM

## 2022-06-09 DIAGNOSIS — E662 Morbid (severe) obesity with alveolar hypoventilation: Secondary | ICD-10-CM

## 2022-06-09 DIAGNOSIS — E669 Obesity, unspecified: Secondary | ICD-10-CM | POA: Diagnosis not present

## 2022-06-09 DIAGNOSIS — E049 Nontoxic goiter, unspecified: Secondary | ICD-10-CM

## 2022-06-09 DIAGNOSIS — I10 Essential (primary) hypertension: Secondary | ICD-10-CM

## 2022-06-09 DIAGNOSIS — I771 Stricture of artery: Secondary | ICD-10-CM | POA: Diagnosis not present

## 2022-06-09 DIAGNOSIS — E785 Hyperlipidemia, unspecified: Secondary | ICD-10-CM | POA: Diagnosis not present

## 2022-06-09 DIAGNOSIS — E559 Vitamin D deficiency, unspecified: Secondary | ICD-10-CM

## 2022-06-09 DIAGNOSIS — J302 Other seasonal allergic rhinitis: Secondary | ICD-10-CM

## 2022-06-09 DIAGNOSIS — Z79899 Other long term (current) drug therapy: Secondary | ICD-10-CM

## 2022-06-09 DIAGNOSIS — R600 Localized edema: Secondary | ICD-10-CM

## 2022-06-09 DIAGNOSIS — E66811 Obesity, class 1: Secondary | ICD-10-CM

## 2022-06-10 LAB — CBC WITH DIFFERENTIAL/PLATELET
Absolute Monocytes: 435 cells/uL (ref 200–950)
Basophils Absolute: 39 cells/uL (ref 0–200)
Basophils Relative: 0.7 %
Eosinophils Absolute: 72 cells/uL (ref 15–500)
Eosinophils Relative: 1.3 %
HCT: 35.7 % (ref 35.0–45.0)
Hemoglobin: 11.6 g/dL — ABNORMAL LOW (ref 11.7–15.5)
Lymphs Abs: 2712 cells/uL (ref 850–3900)
MCH: 26 pg — ABNORMAL LOW (ref 27.0–33.0)
MCHC: 32.5 g/dL (ref 32.0–36.0)
MCV: 80 fL (ref 80.0–100.0)
MPV: 10.9 fL (ref 7.5–12.5)
Monocytes Relative: 7.9 %
Neutro Abs: 2244 cells/uL (ref 1500–7800)
Neutrophils Relative %: 40.8 %
Platelets: 380 10*3/uL (ref 140–400)
RBC: 4.46 10*6/uL (ref 3.80–5.10)
RDW: 13.4 % (ref 11.0–15.0)
Total Lymphocyte: 49.3 %
WBC: 5.5 10*3/uL (ref 3.8–10.8)

## 2022-06-10 LAB — COMPLETE METABOLIC PANEL WITH GFR
AG Ratio: 1.5 (calc) (ref 1.0–2.5)
ALT: 14 U/L (ref 6–29)
AST: 14 U/L (ref 10–35)
Albumin: 4 g/dL (ref 3.6–5.1)
Alkaline phosphatase (APISO): 64 U/L (ref 37–153)
BUN: 19 mg/dL (ref 7–25)
CO2: 32 mmol/L (ref 20–32)
Calcium: 9.2 mg/dL (ref 8.6–10.4)
Chloride: 100 mmol/L (ref 98–110)
Creat: 0.59 mg/dL (ref 0.50–1.05)
Globulin: 2.6 g/dL (calc) (ref 1.9–3.7)
Glucose, Bld: 92 mg/dL (ref 65–99)
Potassium: 4.4 mmol/L (ref 3.5–5.3)
Sodium: 139 mmol/L (ref 135–146)
Total Bilirubin: 0.3 mg/dL (ref 0.2–1.2)
Total Protein: 6.6 g/dL (ref 6.1–8.1)
eGFR: 102 mL/min/{1.73_m2} (ref >=60)

## 2022-06-10 LAB — LIPID PANEL
Cholesterol: 181 mg/dL (ref <200)
HDL: 106 mg/dL (ref >=50)
LDL Cholesterol (Calc): 60 mg/dL (calc)
Non-HDL Cholesterol (Calc): 75 mg/dL (calc) (ref <130)
Total CHOL/HDL Ratio: 1.7 (calc) (ref <5.0)
Triglycerides: 71 mg/dL (ref <150)

## 2022-06-10 LAB — TSH: TSH: 0.42 mIU/L (ref 0.40–4.50)

## 2022-06-10 LAB — HEMOGLOBIN A1C
Hgb A1c MFr Bld: 6.2 % of total Hgb — ABNORMAL HIGH (ref <5.7)
Mean Plasma Glucose: 131 mg/dL
eAG (mmol/L): 7.3 mmol/L

## 2022-07-20 ENCOUNTER — Other Ambulatory Visit: Payer: Self-pay | Admitting: Internal Medicine

## 2022-07-20 DIAGNOSIS — Z1231 Encounter for screening mammogram for malignant neoplasm of breast: Secondary | ICD-10-CM

## 2022-07-24 ENCOUNTER — Other Ambulatory Visit: Payer: Self-pay | Admitting: Internal Medicine

## 2022-08-12 ENCOUNTER — Ambulatory Visit: Payer: BC Managed Care – PPO

## 2022-08-12 ENCOUNTER — Ambulatory Visit
Admission: RE | Admit: 2022-08-12 | Discharge: 2022-08-12 | Disposition: A | Discharge status: Home / Self Care | Payer: BC Managed Care – PPO | Source: Ambulatory Visit | Attending: Internal Medicine | Admitting: Internal Medicine

## 2022-08-12 ENCOUNTER — Other Ambulatory Visit: Payer: Self-pay

## 2022-08-12 DIAGNOSIS — Z1231 Encounter for screening mammogram for malignant neoplasm of breast: Secondary | ICD-10-CM | POA: Diagnosis not present

## 2022-09-08 ENCOUNTER — Other Ambulatory Visit: Payer: Self-pay | Admitting: Internal Medicine

## 2022-09-10 ENCOUNTER — Ambulatory Visit (INDEPENDENT_AMBULATORY_CARE_PROVIDER_SITE_OTHER): Payer: BC Managed Care – PPO | Admitting: Nurse Practitioner

## 2022-09-10 ENCOUNTER — Encounter: Payer: Self-pay | Admitting: Nurse Practitioner

## 2022-09-10 VITALS — BP 158/70 | HR 60 | Temp 97.7°F | Ht 64.0 in | Wt 216.8 lb

## 2022-09-10 DIAGNOSIS — Z23 Encounter for immunization: Secondary | ICD-10-CM

## 2022-09-10 DIAGNOSIS — Z136 Encounter for screening for cardiovascular disorders: Secondary | ICD-10-CM

## 2022-09-10 DIAGNOSIS — Z1389 Encounter for screening for other disorder: Secondary | ICD-10-CM | POA: Diagnosis not present

## 2022-09-10 DIAGNOSIS — Z79899 Other long term (current) drug therapy: Secondary | ICD-10-CM

## 2022-09-10 DIAGNOSIS — M79672 Pain in left foot: Secondary | ICD-10-CM

## 2022-09-10 DIAGNOSIS — G8929 Other chronic pain: Secondary | ICD-10-CM

## 2022-09-10 DIAGNOSIS — I10 Essential (primary) hypertension: Secondary | ICD-10-CM | POA: Diagnosis not present

## 2022-09-10 DIAGNOSIS — Z13220 Encounter for screening for lipoid disorders: Secondary | ICD-10-CM | POA: Diagnosis not present

## 2022-09-10 DIAGNOSIS — Z1329 Encounter for screening for other suspected endocrine disorder: Secondary | ICD-10-CM

## 2022-09-10 DIAGNOSIS — E662 Morbid (severe) obesity with alveolar hypoventilation: Secondary | ICD-10-CM

## 2022-09-10 DIAGNOSIS — Z131 Encounter for screening for diabetes mellitus: Secondary | ICD-10-CM

## 2022-09-10 DIAGNOSIS — E1169 Type 2 diabetes mellitus with other specified complication: Secondary | ICD-10-CM

## 2022-09-10 DIAGNOSIS — E559 Vitamin D deficiency, unspecified: Secondary | ICD-10-CM | POA: Diagnosis not present

## 2022-09-10 DIAGNOSIS — I771 Stricture of artery: Secondary | ICD-10-CM

## 2022-09-10 DIAGNOSIS — Z0000 Encounter for general adult medical examination without abnormal findings: Secondary | ICD-10-CM

## 2022-09-10 DIAGNOSIS — E049 Nontoxic goiter, unspecified: Secondary | ICD-10-CM

## 2022-09-10 DIAGNOSIS — G4733 Obstructive sleep apnea (adult) (pediatric): Secondary | ICD-10-CM

## 2022-09-10 DIAGNOSIS — Z0001 Encounter for general adult medical examination with abnormal findings: Secondary | ICD-10-CM

## 2022-09-10 DIAGNOSIS — Z9109 Other allergy status, other than to drugs and biological substances: Secondary | ICD-10-CM

## 2022-09-10 DIAGNOSIS — E538 Deficiency of other specified B group vitamins: Secondary | ICD-10-CM

## 2022-09-10 MED ORDER — SEMAGLUTIDE(0.25 OR 0.5MG/DOS) 2 MG/3ML ~~LOC~~ SOPN: 0.2500 mg | PEN_INJECTOR | SUBCUTANEOUS | 1 refills | Status: DC

## 2022-09-10 NOTE — Patient Instructions (Signed)
Semaglutide Injection What is this medication? SEMAGLUTIDE (SEM a GLOO tide) treats type 2 diabetes. It works by increasing insulin levels in your body, which decreases your blood sugar (glucose). It also reduces the amount of sugar released into the blood and slows down your digestion. It can also be used to lower the risk of heart attack and stroke in people with type 2 diabetes. Changes to diet and exercise are often combined with this medication. This medicine may be used for other purposes; ask your health care provider or pharmacist if you have questions. COMMON BRAND NAME(S): OZEMPIC What should I tell my care team before I take this medication? They need to know if you have any of these conditions: Endocrine tumors (MEN 2) or if someone in your family had these tumors Eye disease, vision problems History of pancreatitis Kidney disease Stomach problems Thyroid cancer or if someone in your family had thyroid cancer An unusual or allergic reaction to semaglutide, other medications, foods, dyes, or preservatives Pregnant or trying to get pregnant Breast-feeding How should I use this medication? This medication is for injection under the skin of your upper leg (thigh), stomach area, or upper arm. It is given once every week (every 7 days). You will be taught how to prepare and give this medication. Use exactly as directed. Take your medication at regular intervals. Do not take it more often than directed. If you use this medication with insulin, you should inject this medication and the insulin separately. Do not mix them together. Do not give the injections right next to each other. Change (rotate) injection sites with each injection. It is important that you put your used needles and syringes in a special sharps container. Do not put them in a trash can. If you do not have a sharps container, call your pharmacist or care team to get one. A special MedGuide will be given to you by the  pharmacist with each prescription and refill. Be sure to read this information carefully each time. This medication comes with INSTRUCTIONS FOR USE. Ask your pharmacist for directions on how to use this medication. Read the information carefully. Talk to your pharmacist or care team if you have questions. Talk to your care team about the use of this medication in children. Special care may be needed. Overdosage: If you think you have taken too much of this medicine contact a poison control center or emergency room at once. NOTE: This medicine is only for you. Do not share this medicine with others. What if I miss a dose? If you miss a dose, take it as soon as you can within 5 days after the missed dose. Then take your next dose at your regular weekly time. If it has been longer than 5 days after the missed dose, do not take the missed dose. Take the next dose at your regular time. Do not take double or extra doses. If you have questions about a missed dose, contact your care team for advice. What may interact with this medication? Other medications for diabetes Many medications may cause changes in blood sugar, these include: Alcohol containing beverages Antiviral medications for HIV or AIDS Aspirin and aspirin-like medications Certain medications for blood pressure, heart disease, irregular heart beat Chromium Diuretics Female hormones, such as estrogens or progestins, birth control pills Fenofibrate Gemfibrozil Isoniazid Lanreotide Female hormones or anabolic steroids MAOIs like Carbex, Eldepryl, Marplan, Nardil, and Parnate Medications for weight loss Medications for allergies, asthma, cold, or cough Medications for depression,   anxiety, or psychotic disturbances Niacin Nicotine NSAIDs, medications for pain and inflammation, like ibuprofen or naproxen Octreotide Pasireotide Pentamidine Phenytoin Probenecid Quinolone antibiotics such as ciprofloxacin, levofloxacin, ofloxacin Some  herbal dietary supplements Steroid medications such as prednisone or cortisone Sulfamethoxazole; trimethoprim Thyroid hormones Some medications can hide the warning symptoms of low blood sugar (hypoglycemia). You may need to monitor your blood sugar more closely if you are taking one of these medications. These include: Beta-blockers, often used for high blood pressure or heart problems (examples include atenolol, metoprolol, propranolol) Clonidine Guanethidine Reserpine This list may not describe all possible interactions. Give your health care provider a list of all the medicines, herbs, non-prescription drugs, or dietary supplements you use. Also tell them if you smoke, drink alcohol, or use illegal drugs. Some items may interact with your medicine. What should I watch for while using this medication? Visit your care team for regular checks on your progress. Drink plenty of fluids while taking this medication. Check with your care team if you get an attack of severe diarrhea, nausea, and vomiting. The loss of too much body fluid can make it dangerous for you to take this medication. A test called the HbA1C (A1C) will be monitored. This is a simple blood test. It measures your blood sugar control over the last 2 to 3 months. You will receive this test every 3 to 6 months. Learn how to check your blood sugar. Learn the symptoms of low and high blood sugar and how to manage them. Always carry a quick-source of sugar with you in case you have symptoms of low blood sugar. Examples include hard sugar candy or glucose tablets. Make sure others know that you can choke if you eat or drink when you develop serious symptoms of low blood sugar, such as seizures or unconsciousness. They must get medical help at once. Tell your care team if you have high blood sugar. You might need to change the dose of your medication. If you are sick or exercising more than usual, you might need to change the dose of your  medication. Do not skip meals. Ask your care team if you should avoid alcohol. Many nonprescription cough and cold products contain sugar or alcohol. These can affect blood sugar. Pens should never be shared. Even if the needle is changed, sharing may result in passing of viruses like hepatitis or HIV. Wear a medical ID bracelet or chain, and carry a card that describes your disease and details of your medication and dosage times. Do not become pregnant while taking this medication. Women should inform their care team if they wish to become pregnant or think they might be pregnant. There is a potential for serious side effects to an unborn child. Talk to your care team for more information. What side effects may I notice from receiving this medication? Side effects that you should report to your care team as soon as possible: Allergic reactions--skin rash, itching, hives, swelling of the face, lips, tongue, or throat Change in vision Dehydration--increased thirst, dry mouth, feeling faint or lightheaded, headache, dark yellow or brown urine Gallbladder problems--severe stomach pain, nausea, vomiting, fever Heart palpitations--rapid, pounding, or irregular heartbeat Kidney injury--decrease in the amount of urine, swelling of the ankles, hands, or feet Pancreatitis--severe stomach pain that spreads to your back or gets worse after eating or when touched, fever, nausea, vomiting Thyroid cancer--new mass or lump in the neck, pain or trouble swallowing, trouble breathing, hoarseness Side effects that usually do not require medical   attention (report to your care team if they continue or are bothersome): Diarrhea Loss of appetite Nausea Stomach pain Vomiting This list may not describe all possible side effects. Call your doctor for medical advice about side effects. You may report side effects to FDA at 1-800-FDA-1088. Where should I keep my medication? Keep out of the reach of children. Store  unopened pens in a refrigerator between 2 and 8 degrees C (36 and 46 degrees F). Do not freeze. Protect from light and heat. After you first use the pen, it can be stored for 56 days at room temperature between 15 and 30 degrees C (59 and 86 degrees F) or in a refrigerator. Throw away your used pen after 56 days or after the expiration date, whichever comes first. Do not store your pen with the needle attached. If the needle is left on, medication may leak from the pen. NOTE: This sheet is a summary. It may not cover all possible information. If you have questions about this medicine, talk to your doctor, pharmacist, or health care provider.  2023 [employer]/Gold Standard (2021-01-01 00:00:00)  

## 2022-09-10 NOTE — Progress Notes (Signed)
Complete Physical  Assessment and Plan:  Encounter for Annual Physical Exam with abnormal findings Due annually  Health Maintenance reviewed Healthy lifestyle reviewed and goals set  Tortuous aorta (HCC) Control blood pressure, cholesterol, glucose, increase exercise.   Essential hypertension Above Goal Discussed changes to medications if continues to remain HTN in the next 1-3 months. Follow up at next OV/3 mo after closely  monitoring BP - suggested to bring BP log to NOV. Discussed DASH (Dietary Approaches to Stop Hypertension) DASH diet is lower in sodium than a typical American diet. Cut back on foods that are high in saturated fat, cholesterol, and trans fats. Eat more whole-grain foods, fish, poultry, and nuts Remain active and exercise as tolerated daily.  Monitor BP at home-Call if greater than 130/80.  Check CMP/CBC  Diabetes mellitus without complication ([place]) Start Ozempic Education: Reviewed 'ABCs' of diabetes management  Discussed goals to be met and/or maintained include A1C (<7) Blood pressure (<130/80) Cholesterol (LDL <70) Continue Eye Exam yearly  Continue Dental Exam Q6 mo Discussed dietary recommendations Discussed Physical Activity recommendations Foot exam - Low Risk Check A1C   OSA (obstructive sleep apnea)/ Obesity hypoventilation syndrome (HCC) Continue CPAP - she will have retested for further compliance.   Weight loss advised  Mixed hyperlipidemia associated with T2DM ([place]) Discussed lifestyle modifications. Recommended diet heavy in fruits and veggies, omega 3's. Decrease consumption of animal meats, cheeses, and dairy products. Remain active and exercise as tolerated. Continue to monitor. Check lipids/TSH   Obesity - BMI 33 Discussed appropriate BMI Goal of losing 1 lb per month. Diet modification. Physical activity. Encouraged/praised to build confidence.   Vitamin D deficiency Continue supplementation Check vitamin D  level  Goiter Biopsy negative, stable on f/u US 12/2018,  No changes noted on today's exam Monitor TSH levels  Environmental allergies Continue antihastime Avoid triggers  Chronic right-sided low back pain without sciatica/Left foot pain Established with Dr. Ninfa Linden, gabapentin PRN but hasn't needed Improved with walking and weight loss RICE Method when flared to left foot RTC for further evaluation and possible imaging if no improvement.  Medication management All medications discussed and reviewed in full. All questions and concerns regarding medications addressed.    B12 deficiency  Continue supplement Monitor levels  Need for influenza vaccine Quadrivalent flu vaccine administered without complication today    Screening for cardiovascular disorder Check and monitor EKG  Screening for hematuria or proteinuria Check and monitor UA/Microalbumin/CMP  Screening for thyroid disorder Check and monitor TSH  Orders Placed This Encounter  Procedures   Flu Vaccine QUAD 6+ mos PF IM (Fluarix Quad PF)   CBC with Differential/Platelet   COMPLETE METABOLIC PANEL WITH GFR   Magnesium   Lipid panel   TSH   Hemoglobin A1c   Insulin, random   VITAMIN D 25 Hydroxy (Vit-D Deficiency, Fractures)   Urinalysis, Routine w reflex microscopic   Microalbumin / creatinine urine ratio   EKG 12-Lead   Meds ordered this encounter  Medications   Semaglutide,0.25 or 0.5MG/DOS, 2 MG/3ML SOPN    Sig: Inject 0.25 mg into the skin once a week.    Dispense:  3 mL    Refill:  1    Order Specific Question:   Supervising Provider    Answer:   Unk Pinto [phone]    Notify office for further evaluation and treatment, questions or concerns if any reported s/s fail to improve.   The patient was advised to call back or seek an in-person evaluation if  any symptoms worsen or if the condition fails to improve as anticipated.   Further disposition pending results of labs. Discussed med's  effects and SE's.    I discussed the assessment and treatment plan with the patient. The patient was provided an opportunity to ask questions and all were answered. The patient agreed with the plan and demonstrated an understanding of the instructions.  Discussed med's effects and SE's. Screening labs and tests as requested with regular follow-up as recommended.  I provided 35 minutes of face-to-face time during this encounter including counseling, chart review, and critical decision making was preformed.   Future Appointments  Date Time Provider [place]  12/23/2022  9:30 AM Darrol Jump, NP [facility] None  09/12/2023 10:00 AM Darrol Jump, NP [facility] None    HPI  61 y.o. female, presents for a complete physical and follow up for has Hypertension; Hyperlipidemia associated with type 2 diabetes mellitus ([place]); Type 2 diabetes mellitus with obesity ([place]); Environmental allergies; Obesity hypoventilation syndrome ([place]) ; Goiter; Vitamin D deficiency; Obesity (BMI 30.0-34.9); Tortuous aorta ([place]); Carpal tunnel syndrome, left upper limb; S/P small bowel resection; B12 deficiency; and OSA (obstructive sleep apnea) on their problem list.   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 She follows with [facility].   Overall she reports feeling well.  This year she had SBO and had resection in 07/2020, has done well since.  She has hx of right hip/back pain, and bilateral knee pain, seeing Dr. Ninfa Linden, but reports symptoms improved with daily walking. Takes gabapentin PRN pain but hasn't needed.  Has noticed some increase in left foot pain, and swelling.  Feels associated with overuse, walking daily.    She has OSA, on CPAP, does well with this, but admits only wearing 3 days a week. Feels as though she no longer needs d/t weight loss.   BMI is Body mass index is 37.21 kg/m., she has been working on diet and exercise. Walking 3  miles daily, walking with a group of ladies in her neighborhood.   Wt Readings from Last 3 Encounters:  09/10/22 216 lb 12.8 oz (98.3 kg)  06/09/22 211 lb 12.8 oz (96.1 kg)  01/27/22 209 lb 12.8 oz (95.2 kg)   She does check blood pressures at home, recently ranging 130-140s/80s since recent admission for SBO, today their BP is BP: (!) 158/70  Currently taking ziac 10/6.25, lasix 40 mg BID, olmesartan 40 mg daily, clonidine 0.2 mg BID, hydralazine 25 mg TID without notable improvement.   She does workout. She denies chest pain, shortness of breath, dizziness. 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 Lorin Mercy in [place] retinal specialist, had normal CT chest 2012, normal CXR 2017 other than tortuous aorta.   She is on cholesterol medication (rosuvastatin 5 mg once weekly) and denies myalgias. Her cholesterol is not at goal of LDL <70. The cholesterol last visit was:   Lab Results  Component Value Date   CHOL 181 06/09/2022   HDL 106 06/09/2022   LDLCALC 60 06/09/2022   TRIG 71 06/09/2022   CHOLHDL 1.7 06/09/2022   She has been working on diet and exercise for Diabetes without complications, currently treated by metformin 2000 mg daily, she is on bASA, she is on ACE/ARB, and denies nausea, paresthesia of the feet, polydipsia and polyuria. She does check fasting glucose, running 80s-115.  Last A1C was:  Lab Results  Component Value Date   HGBA1C 6.2 (H) 06/09/2022    Do you have any pain in your calf muscles when walking?  No Do you have any pain in your foot or feet?  Yes  If yes, left foot painful Have you had any problems with your feet since the last visit?  No. Do you have any sores on your feet?  No.  Have you noticed any blood or discharge on your socks or hose?  No Does patient wear shoes that are closed toe, low heel, adequate toe room (no pointed toe), and breathable material  like leather, canvas or suede?  Yes  Last GFR: Lab Results  Component Value Date   GFRAA 120 01/14/2021   She has hx of goiter with nodule, had negative biopsy in 2012, f/u thyroid US 12/2018 showed nodule was stable/minimal changes.  Lab Results  Component Value Date   TSH 0.42 06/09/2022   Patient is on Vitamin D supplement.   Lab Results  Component Value Date   VD25OH 114 (H) 09/10/2021         Current Medications:  Current Outpatient Medications on File Prior to Visit  Medication Sig Dispense Refill   amLODipine (NORVASC) 2.5 MG tablet Take 1 tab every night  for  BP 90 tablet 3   aspirin EC 81 MG tablet Take 81 mg by mouth daily.     bisoprolol-hydrochlorothiazide (ZIAC) 10-6.25 MG tablet TAKE 1 TABLET BY MOUTH EVERY DAY FOR BLOOD PRESSURE 90 tablet 3   Cyanocobalamin (VITAMIN B-12 SL) Place 1 tablet under the tongue daily.      fluticasone (FLONASE) 50 MCG/ACT nasal spray Place 2 sprays into both nostrils daily. (Patient taking differently: Place 2 sprays into both nostrils daily as needed for allergies.) 16 g 6   furosemide (LASIX) 40 MG tablet Take  1 tablet  2 x /day  for BP & Fluid Retention / Ankle Swelling                                                      /                 TAKE 1 TABLET BY MOUTH 180 tablet 3   gabapentin (NEURONTIN) 100 MG capsule TAKE 1 CAPSULE 3 TIMES A DAY FOR CHRONIC PAIN 270 capsule 2   hydrALAZINE (APRESOLINE) 25 MG tablet Take  1 tablet  3 x /day  for BP & Heart 270 tablet 3   Magnesium 250 MG TABS Take 250 mg by mouth daily.     metFORMIN (GLUCOPHAGE-XR) 500 MG 24 hr tablet Take  2 tablets  2 x /day with Meals for Diabetes                           /          TAKE 2 TABLETS TWICE X DAY 360 tablet 3   Multiple Vitamins-Minerals (MULTIVITAMIN PO) Take by mouth daily.     olmesartan (BENICAR) 40 MG tablet TAKE 1 TABLET DAILY FOR BP & DIABETIC KIDNEY PROTECTION 90 tablet 1   RESTASIS 0.05 % ophthalmic emulsion Place 1 drop into both eyes 3  (three) times daily as needed for dry eyes.     rosuvastatin (CRESTOR) 5  MG tablet TAKE 1 TABLET BY MOUTH ONCE A WEEK. 13 tablet 3   butalbital-acetaminophen-caffeine (FIORICET) 50-325-40 MG tablet Take        1 to 2 tablets    4 x  /day as needed for Pain or Headache (Patient not taking: Reported on 09/10/2022) 30 tablet 0   diclofenac sodium (VOLTAREN) 1 % GEL Apply 2 g topically 4 (four) times daily. (Patient not taking: Reported on 06/09/2022) 100 g 3   No current facility-administered medications on file prior to visit.   Allergies:  Allergies  Allergen Reactions   Amlodipine Swelling   Naproxen Hives   Meloxicam & Diet Manage Prod Rash   Medical History:  She has Hypertension; Hyperlipidemia associated with type 2 diabetes mellitus ([place]); Type 2 diabetes mellitus with obesity ([place]); Environmental allergies; Obesity hypoventilation syndrome ([place]) ; Goiter; Vitamin D deficiency; Obesity (BMI 30.0-34.9); Tortuous aorta ([place]); Carpal tunnel syndrome, left upper limb; S/P small bowel resection; B12 deficiency; and OSA (obstructive sleep apnea) on their problem list.  Health Maintenance:   Immunization History  Administered Date(s) Administered   Influenza Inj Mdck Quad With Preservative 07/17/2018, 08/15/2019, 08/21/2020   Influenza Split 07/18/2013, 10/01/2014   Influenza,inj,Quad PF,6+ Mos 09/10/2021, 09/10/2022   Influenza,inj,quad, With Preservative 09/02/2016   PFIZER(Purple Top)SARS-COV-2 Vaccination 01/05/2020, 02/12/2020   Pneumococcal Polysaccharide-23 06/16/1999   Tdap 03/10/2009, 07/18/2019   Tetanus: 2020 Pneumovax: 2000, due age 31 Flu vaccine: 08/2020, TODAY  Shingles: DUE, wants to call and ask Covid 19: 2/2, 2021, pfizer, had several boosters, record requested  LMP:  Hysterectomy Pelvic/PAP: last by GYN, s/p hysterectomy, never abormal, follow up for recommendations MGM:  08/2022 Negative Due 1 year  Colonoscopy: 2014 due 10 years Echo 2017  Last Eye Exam: Dr.  Sabra Heck, 09/09/2022, report requested  Last Dental Exam: Dr. Gilford Rile, last visit 2023, goes q80mLast Derm Exam:  Does not follow - denies any recent skin changes.  Patient Care Team: MUnk Pinto MD as PCP - General (Internal Medicine) BMcarthur Rossetti MD as Consulting Physician (Orthopedic Surgery)  Surgical History:  She has a past surgical history that includes Eye surgery; achiles tendon; Abdominal hysterectomy; Tonsillectomy; and laparotomy (N/A, 07/27/2020). Family History:  Herfamily history includes Diabetes in her father and paternal grandmother; Heart disease in her father, paternal grandmother, and sister; Hypertension in her brother, mother, and sister. Social History:  She reports that she has never smoked. She has never used smokeless tobacco. She reports current alcohol use. She reports that she does not use drugs.  Review of Systems: Review of Systems  Constitutional:  Negative for chills, fever and malaise/fatigue.  HENT:  Negative for congestion, ear pain and sore throat.   Eyes: Negative.   Respiratory:  Negative for cough, shortness of breath and wheezing.   Cardiovascular:  Negative for chest pain, palpitations and leg swelling.  Gastrointestinal:  Negative for abdominal pain, blood in stool, constipation, diarrhea, heartburn and melena.  Genitourinary: Negative.   Musculoskeletal:  Negative for back pain, falls, joint pain, myalgias and neck pain.  Skin: Negative.   Neurological:  Negative for dizziness, sensory change, loss of consciousness and headaches.  Psychiatric/Behavioral:  Negative for depression. The patient is not nervous/anxious and does not have insomnia.     Physical Exam: Estimated body mass index is 37.21 kg/m as calculated from the following:   Height as of this encounter: _0  (1.626 m).   Weight as of this encounter: 216 lb 12.8 oz (98.3 kg). BP (!) 158/70  Pulse 60   Temp 97.7 F (36.5 C)   Ht _0  (1.626 m)   Wt 216 lb  12.8 oz (98.3 kg)   SpO2 98%   BMI 37.21 kg/m  General Appearance: Well nourished, in no apparent distress.  Eyes: PERRLA, EOMs, conjunctiva no swelling or erythema Sinuses: No Frontal/maxillary tenderness  ENT/Mouth: Ext aud canals clear, normal light reflex with TMs without erythema, bulging. Good dentition. No erythema, swelling, or exudate on post pharynx. Tonsils not swollen or erythematous. Hearing normal.  Neck: Supple, thyroid mildly enlarged on right. No bruits  Respiratory: Respiratory effort normal, BS equal bilaterally without rales, rhonchi, wheezing or stridor.  Cardio: RRR, no murmur, without rubs or gallops. Brisk peripheral pulses without ankle edema, mild foot mild non-pitting edema Chest: symmetric, with normal excursions and percussion.  Breasts: Getting annual mammogram, no concerns, regular self checks, declines manual today  Abdomen: Soft, nontender, no guarding, rebound, hernias, masses, or organomegaly.  Lymphatics: Non tender without lymphadenopathy.  Genitourinary: Defer to GYN Musculoskeletal: Full ROM all peripheral extremities,5/5 strength, and normal gait.  Skin: Warm, dry without rashes, lesions, ecchymosis.  Neuro: Cranial nerves intact, reflexes equal bilaterally. Normal muscle tone, no cerebellar symptoms. Sensation intact. Sensation intact to monofilament bilateral feet.  Psych: Awake and oriented X 3, normal affect, Insight and Judgment appropriate.     PAST DIABETIC HISTORY:  History of amputations:  No  Previous foot ulcer:  No   OBJECTIVE/FOOT EXAMINATION:  Foot:    right footThe patient denies associated drainage, fever, pain, or redness with the ulcer Left foot with tenderness to lateral metatarsals during palpation, mild edema.  Skin:    normal exam; no erythema, swelling or tendernessnormal turgor Nails:  thickening Sensory: normal to light touch and a pin prick     Right Pulses: present Left Pulses: present  Bunions:  Notes mild  bunion deformity of the right, left great toe.   EKG: Sinus bradycardia  Darrol Jump, NP 2:44 PM [facility]

## 2022-09-11 LAB — HEMOGLOBIN A1C
Hgb A1c MFr Bld: 6.5 % of total Hgb — ABNORMAL HIGH (ref <5.7)
Mean Plasma Glucose: 140 mg/dL
eAG (mmol/L): 7.7 mmol/L

## 2022-09-11 LAB — LIPID PANEL
Cholesterol: 170 mg/dL (ref <200)
HDL: 106 mg/dL (ref >=50)
LDL Cholesterol (Calc): 50 mg/dL (calc)
Non-HDL Cholesterol (Calc): 64 mg/dL (calc) (ref <130)
Total CHOL/HDL Ratio: 1.6 (calc) (ref <5.0)
Triglycerides: 64 mg/dL (ref <150)

## 2022-09-11 LAB — CBC WITH DIFFERENTIAL/PLATELET
Absolute Monocytes: 340 cells/uL (ref 200–950)
Basophils Absolute: 38 cells/uL (ref 0–200)
Basophils Relative: 0.7 %
Eosinophils Absolute: 59 cells/uL (ref 15–500)
Eosinophils Relative: 1.1 %
HCT: 37.9 % (ref 35.0–45.0)
Hemoglobin: 12.2 g/dL (ref 11.7–15.5)
Lymphs Abs: 3035 cells/uL (ref 850–3900)
MCH: 26 pg — ABNORMAL LOW (ref 27.0–33.0)
MCHC: 32.2 g/dL (ref 32.0–36.0)
MCV: 80.6 fL (ref 80.0–100.0)
MPV: 11 fL (ref 7.5–12.5)
Monocytes Relative: 6.3 %
Neutro Abs: 1928 cells/uL (ref 1500–7800)
Neutrophils Relative %: 35.7 %
Platelets: 394 10*3/uL (ref 140–400)
RBC: 4.7 10*6/uL (ref 3.80–5.10)
RDW: 14.3 % (ref 11.0–15.0)
Total Lymphocyte: 56.2 %
WBC: 5.4 10*3/uL (ref 3.8–10.8)

## 2022-09-11 LAB — COMPLETE METABOLIC PANEL WITH GFR
AG Ratio: 1.4 (calc) (ref 1.0–2.5)
ALT: 9 U/L (ref 6–29)
AST: 13 U/L (ref 10–35)
Albumin: 4.1 g/dL (ref 3.6–5.1)
Alkaline phosphatase (APISO): 56 U/L (ref 37–153)
BUN: 16 mg/dL (ref 7–25)
CO2: 32 mmol/L (ref 20–32)
Calcium: 9.3 mg/dL (ref 8.6–10.4)
Chloride: 101 mmol/L (ref 98–110)
Creat: 0.65 mg/dL (ref 0.50–1.05)
Globulin: 2.9 g/dL (calc) (ref 1.9–3.7)
Glucose, Bld: 107 mg/dL — ABNORMAL HIGH (ref 65–99)
Potassium: 4 mmol/L (ref 3.5–5.3)
Sodium: 140 mmol/L (ref 135–146)
Total Bilirubin: 0.4 mg/dL (ref 0.2–1.2)
Total Protein: 7 g/dL (ref 6.1–8.1)
eGFR: 99 mL/min/{1.73_m2} (ref >=60)

## 2022-09-11 LAB — URINALYSIS, ROUTINE W REFLEX MICROSCOPIC
Bacteria, UA: NONE SEEN /HPF
Bilirubin Urine: NEGATIVE
Glucose, UA: NEGATIVE
Hgb urine dipstick: NEGATIVE
Hyaline Cast: NONE SEEN /LPF
Ketones, ur: NEGATIVE
Nitrite: NEGATIVE
Protein, ur: NEGATIVE
RBC / HPF: NONE SEEN /HPF (ref 0–2)
Specific Gravity, Urine: 1.012 (ref 1.001–1.035)
Squamous Epithelial / HPF: NONE SEEN /HPF (ref <=5)
pH: 5.5 (ref 5.0–8.0)

## 2022-09-11 LAB — MICROALBUMIN / CREATININE URINE RATIO
Creatinine, Urine: 63 mg/dL (ref 20–275)
Microalb Creat Ratio: 13 mcg/mg creat (ref <30)
Microalb, Ur: 0.8 mg/dL

## 2022-09-11 LAB — TSH: TSH: 0.65 mIU/L (ref 0.40–4.50)

## 2022-09-11 LAB — MAGNESIUM: Magnesium: 1.8 mg/dL (ref 1.5–2.5)

## 2022-09-11 LAB — VITAMIN D 25 HYDROXY (VIT D DEFICIENCY, FRACTURES): Vit D, 25-Hydroxy: 53 ng/mL (ref 30–100)

## 2022-09-11 LAB — INSULIN, RANDOM: Insulin: 3.1 u[IU]/mL

## 2022-09-11 LAB — MICROSCOPIC MESSAGE

## 2022-10-15 ENCOUNTER — Telehealth: Payer: Self-pay

## 2022-10-15 NOTE — Patient Outreach (Signed)
  Care Coordination   Initial Visit Note   10/15/2022 Name: Tanya [name] MRN: [id number] DOB: [birth year]  Tanya [name] is a 61 y.o. year old female who sees Lucky Cowboy, MD for primary care. I spoke with  Durel Salts by phone today.  What matters to the patients health and wellness today?  none    Goals Addressed             This Visit's Progress    Care coordination activities-No follow up required       Care Coordination Interventions: Advised patient to on annual wellness exam and flu vaccine.  Discussed [facility] services and support.  Patient decline at this time.            SDOH assessments and interventions completed:  Yes  SDOH Interventions Today    Flowsheet Row Most Recent Value  SDOH Interventions   Housing Interventions Intervention Not Indicated  Transportation Interventions Intervention Not Indicated        Care Coordination Interventions:  Yes, provided   Follow up plan: No further intervention required.   Encounter Outcome:  Pt. Visit Completed   Bary Leriche, RN, MSN [facility] Care Management Care Management Coordinator Direct Line [phone]

## 2022-10-15 NOTE — Patient Instructions (Signed)
Visit Information  Thank you for taking time to visit with me today. Please don't hesitate to contact me if I can be of assistance to you.   Following are the goals we discussed today:   Goals Addressed             This Visit's Progress    Care coordination activities-No follow up required       Care Coordination Interventions: Advised patient to on annual wellness exam and flu vaccine.  Discussed [facility] services and support.  Patient decline at this time.             If you are experiencing a Mental Health or Behavioral Health Crisis or need someone to talk to, please call the Suicide and Crisis Lifeline: 988   Patient verbalizes understanding of instructions and care plan provided today and agrees to view in MyChart. Active MyChart status and patient understanding of how to access instructions and care plan via MyChart confirmed with patient.     No further follow up required:    Tanya Leriche, RN, MSN [facility] Care Management Care Management Coordinator Direct Line [phone]

## 2022-10-19 ENCOUNTER — Other Ambulatory Visit: Payer: Self-pay | Admitting: Internal Medicine

## 2022-10-28 ENCOUNTER — Other Ambulatory Visit: Payer: Self-pay | Admitting: Internal Medicine

## 2022-11-04 ENCOUNTER — Other Ambulatory Visit: Payer: Self-pay | Admitting: Nurse Practitioner

## 2022-11-24 DIAGNOSIS — Z23 Encounter for immunization: Secondary | ICD-10-CM | POA: Diagnosis not present

## 2022-12-01 ENCOUNTER — Other Ambulatory Visit: Payer: Self-pay | Admitting: Nurse Practitioner

## 2022-12-01 DIAGNOSIS — E662 Morbid (severe) obesity with alveolar hypoventilation: Secondary | ICD-10-CM

## 2022-12-01 DIAGNOSIS — E1169 Type 2 diabetes mellitus with other specified complication: Secondary | ICD-10-CM

## 2022-12-01 MED ORDER — SEMAGLUTIDE(0.25 OR 0.5MG/DOS) 2 MG/3ML ~~LOC~~ SOPN: 0.2500 mg | PEN_INJECTOR | SUBCUTANEOUS | 1 refills | Status: DC

## 2022-12-14 ENCOUNTER — Ambulatory Visit (INDEPENDENT_AMBULATORY_CARE_PROVIDER_SITE_OTHER): Payer: BC Managed Care – PPO | Admitting: Orthopaedic Surgery

## 2022-12-14 ENCOUNTER — Other Ambulatory Visit: Payer: Self-pay

## 2022-12-14 ENCOUNTER — Encounter: Payer: Self-pay | Admitting: Orthopaedic Surgery

## 2022-12-14 DIAGNOSIS — R202 Paresthesia of skin: Secondary | ICD-10-CM | POA: Diagnosis not present

## 2022-12-14 DIAGNOSIS — R200 Anesthesia of skin: Secondary | ICD-10-CM

## 2022-12-14 NOTE — Progress Notes (Signed)
The patient is a very pleasant right-hand-dominant female who comes in with bilateral hand numbness and tingling this been going on for some time now.  It has been getting worse at night.  She is diabetic but under excellent control.  The left is worse than the right.  She has tried night splints as well..  She has been taking anti-inflammatories and worked on activity modification.  She does a lot of typing and it does occur during her typing as well.  On exam there is no evidence of muscle atrophy either hand.  She has numbness in the median nerve distribution bilaterally.  She has good pinch and grip strength as well.  Her neck exam is normal and there is no Tinel sign over the cubital tunnel on either side.  There is not a true Tinel's sign over the carpal tunnel either.  This is the same on either side.  At this point given her symptoms and the failure conservative treatment, we will obtain EMG/nerve conduction studies to rule out carpal tunnel syndrome.  We will then see her in follow-up once we have the studies.  She agrees with this treatment plan.

## 2022-12-23 ENCOUNTER — Encounter: Payer: Self-pay | Admitting: Nurse Practitioner

## 2022-12-23 ENCOUNTER — Ambulatory Visit: Payer: BC Managed Care – PPO | Admitting: Nurse Practitioner

## 2022-12-23 VITALS — BP 136/68 | HR 64 | Temp 97.9°F | Ht 63.5 in | Wt 218.0 lb

## 2022-12-23 DIAGNOSIS — Z9109 Other allergy status, other than to drugs and biological substances: Secondary | ICD-10-CM

## 2022-12-23 DIAGNOSIS — I10 Essential (primary) hypertension: Secondary | ICD-10-CM | POA: Diagnosis not present

## 2022-12-23 DIAGNOSIS — G4733 Obstructive sleep apnea (adult) (pediatric): Secondary | ICD-10-CM

## 2022-12-23 DIAGNOSIS — D509 Iron deficiency anemia, unspecified: Secondary | ICD-10-CM | POA: Diagnosis not present

## 2022-12-23 DIAGNOSIS — Z79899 Other long term (current) drug therapy: Secondary | ICD-10-CM

## 2022-12-23 DIAGNOSIS — M545 Low back pain, unspecified: Secondary | ICD-10-CM

## 2022-12-23 DIAGNOSIS — E559 Vitamin D deficiency, unspecified: Secondary | ICD-10-CM | POA: Diagnosis not present

## 2022-12-23 DIAGNOSIS — I771 Stricture of artery: Secondary | ICD-10-CM

## 2022-12-23 DIAGNOSIS — E669 Obesity, unspecified: Secondary | ICD-10-CM

## 2022-12-23 DIAGNOSIS — E049 Nontoxic goiter, unspecified: Secondary | ICD-10-CM

## 2022-12-23 DIAGNOSIS — G8929 Other chronic pain: Secondary | ICD-10-CM

## 2022-12-23 DIAGNOSIS — E785 Hyperlipidemia, unspecified: Secondary | ICD-10-CM | POA: Diagnosis not present

## 2022-12-23 DIAGNOSIS — E1169 Type 2 diabetes mellitus with other specified complication: Secondary | ICD-10-CM

## 2022-12-23 NOTE — Progress Notes (Signed)
Follow Up  Assessment and Plan:  Tortuous aorta (HCC) Control blood pressure, cholesterol, glucose, increase exercise.   Essential hypertension Controlled Discussed DASH (Dietary Approaches to Stop Hypertension) DASH diet is lower in sodium than a typical American diet. Cut back on foods that are high in saturated fat, cholesterol, and trans fats. Eat more whole-grain foods, fish, poultry, and nuts Remain active and exercise as tolerated daily.  Monitor BP at home-Call if greater than 130/80.  Check CMP/CBC  Diabetes mellitus without complication (HCC) Increase Ozempic to 0.5 mg weekly - may increase to 1 mg 1 month after if no SE.  Education: Reviewed 'ABCs' of diabetes management  Discussed goals to be met and/or maintained include A1C (<7) Blood pressure (<130/80) Cholesterol (LDL <70) Continue Eye Exam yearly  Continue Dental Exam Q6 mo Discussed dietary recommendations Discussed Physical Activity recommendations Foot exam - Low Risk Check A1C   OSA (obstructive sleep apnea)/ Obesity hypoventilation syndrome (HCC) Continue CPAP Continue weight loss   Mixed hyperlipidemia associated with T2DM ([place]) Discussed lifestyle modifications. Recommended diet heavy in fruits and veggies, omega 3's. Decrease consumption of animal meats, cheeses, and dairy products. Remain active and exercise as tolerated. Continue to monitor. Check lipids/TSH  Obesity - BMI 33 Discussed appropriate BMI Diet modification. Physical activity. Encouraged/praised to build confidence.  Vitamin D deficiency Continue supplementation Check vitamin D level  Goiter Biopsy negative, stable on f/u US 12/2018,  No changes noted on today's exam Monitor TSH levels  Environmental allergies Continue antihastime Avoid triggers  Chronic right-sided low back pain without sciatica/Left foot pain Established with Dr. Ninfa Linden, gabapentin PRN but hasn't needed Improved with walking and weight  loss RICE Method when flared to left foot RTC for further evaluation and possible imaging if no improvement.  Medication management All medications discussed and reviewed in full. All questions and concerns regarding medications addressed.    Orders Placed This Encounter  Procedures   CBC with Differential/Platelet   COMPLETE METABOLIC PANEL WITH GFR   Lipid panel   Hemoglobin A1c   VITAMIN D 25 Hydroxy (Vit-D Deficiency, Fractures)   TSH   Notify office for further evaluation and treatment, questions or concerns if any reported s/s fail to improve.   The patient was advised to call back or seek an in-person evaluation if any symptoms worsen or if the condition fails to improve as anticipated.   Further disposition pending results of labs. Discussed med's effects and SE's.    I discussed the assessment and treatment plan with the patient. The patient was provided an opportunity to ask questions and all were answered. The patient agreed with the plan and demonstrated an understanding of the instructions.  Discussed med's effects and SE's. Screening labs and tests as requested with regular follow-up as recommended.  I provided 25 minutes of face-to-face time during this encounter including counseling, chart review, and critical decision making was preformed.   Future Appointments  Date Time Provider [place]  12/24/2022  9:00 AM Magnus Sinning, MD [facility] None  09/12/2023 10:00 AM Darrol Jump, NP [facility] None    HPI  62 y.o. female, presents for a general follow up. She has Hypertension; Hyperlipidemia associated with type 2 diabetes mellitus ([place]); Type 2 diabetes mellitus with obesity ([place]); Environmental allergies; Obesity hypoventilation syndrome ([place]) ; Goiter; Vitamin D deficiency; Obesity (BMI 30.0-34.9); Tortuous aorta ([place]); Carpal tunnel syndrome, left upper limb; S/P small bowel resection; B12 deficiency; and OSA (obstructive sleep apnea) on their problem  list.   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 She follows with [facility].   Overall she reports feeling well.  She has no new or additional concerns to report in clinic today.  This year she had SBO and had resection in 07/2020, has done well since.  She has hx of right hip/back pain, and bilateral knee pain, seeing Dr. Ninfa Linden, but reports symptoms improved with daily walking. Takes gabapentin PRN pain but hasn't needed.  Has noticed some increase in left foot pain, and swelling.  Feels associated with overuse, walking daily.    She has OSA, on CPAP, reports compliance - continuing to lose weight.  BMI is Body mass index is 38.01 kg/m., she has been working on diet and exercise. Walking 3 miles daily and continues walking with a group of ladies in her neighborhood.   Wt Readings from Last 3 Encounters:  12/23/22 218 lb (98.9 kg)  09/10/22 216 lb 12.8 oz (98.3 kg)  06/09/22 211 lb 12.8 oz (96.1 kg)   She does check blood pressures at home, recently ranging 130-140s/80s today their BP is BP: 136/68 She denies CP, heart palpitations, dizziness, SOB.  BP Readings from Last 3 Encounters:  12/23/22 136/68  09/10/22 (!) 158/70  06/09/22 (!) 140/70     She does workout. She denies chest pain, shortness of breath, dizziness. 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 Lorin Mercy in [place] retinal specialist, had normal CT chest 2012, normal CXR 2017 other than tortuous aorta.   She is on cholesterol medication (rosuvastatin 5 mg once weekly) and denies myalgias. Her cholesterol is not at goal of LDL <70. The cholesterol last visit was:   Lab Results  Component Value Date   CHOL 170 09/10/2022   HDL 106 09/10/2022   LDLCALC 50 09/10/2022   TRIG 64 09/10/2022   CHOLHDL 1.6 09/10/2022   She has been working on diet and exercise for  Diabetes without complications, currently treated by metformin 2000 mg daily.  During last OV she was started on Ozempic. she is on bASA, she is on ACE/ARB, and denies nausea, paresthesia of the feet, polydipsia and polyuria. She does check fasting glucose, running 80s-115.  Last A1C was:  Lab Results  Component Value Date   HGBA1C 6.5 (H) 09/10/2022   Last GFR: Lab Results  Component Value Date   GFRAA 120 01/14/2021   She has hx of goiter with nodule, had negative biopsy in 2012, f/u thyroid US 12/2018 showed nodule was stable/minimal changes.  Lab Results  Component Value Date   TSH 0.65 09/10/2022   Patient is on Vitamin D supplement.   Lab Results  Component Value Date   VD25OH 53 09/10/2022      Current Medications:  Current Outpatient Medications on File Prior to Visit  Medication Sig Dispense Refill   amLODipine (NORVASC) 2.5 MG tablet TAKE 1 TAB EVERY NIGHT FOR BP 90 tablet 3   aspirin EC 81 MG tablet Take 81 mg by mouth daily.     bisoprolol-hydrochlorothiazide (ZIAC) 10-6.25 MG tablet TAKE 1 TABLET BY MOUTH EVERY DAY FOR BLOOD PRESSURE 90 tablet 3   Cyanocobalamin (VITAMIN B-12 SL) Place 1 tablet under the tongue daily.      fluticasone (FLONASE) 50 MCG/ACT nasal spray Place 2 sprays into both nostrils daily. (Patient taking differently: Place 2 sprays into both nostrils daily as needed  for allergies.) 16 g 6   furosemide (LASIX) 40 MG tablet Take  1 tablet  2 x /day  for BP & Fluid Retention / Ankle Swelling                                                      /                 TAKE 1 TABLET BY MOUTH 180 tablet 3   gabapentin (NEURONTIN) 100 MG capsule TAKE 1 CAPSULE 3 TIMES A DAY FOR CHRONIC PAIN 270 capsule 2   hydrALAZINE (APRESOLINE) 25 MG tablet Take  1 tablet  3 x /day  for BP & Heart 270 tablet 3   Magnesium 250 MG TABS Take 250 mg by mouth daily.     metFORMIN (GLUCOPHAGE-XR) 500 MG 24 hr tablet TAKE 2 TABLETS BY MOUTH TWICE A DAY WITH A MEAL FOR DIABETES 360  tablet 3   Multiple Vitamins-Minerals (MULTIVITAMIN PO) Take by mouth daily.     olmesartan (BENICAR) 40 MG tablet TAKE 1 TABLET DAILY FOR BLOOD PRESSURE & DIABETIC KIDNEY PROTECTION 90 tablet 1   RESTASIS 0.05 % ophthalmic emulsion Place 1 drop into both eyes 3 (three) times daily as needed for dry eyes.     rosuvastatin (CRESTOR) 5 MG tablet TAKE 1 TABLET BY MOUTH ONCE A WEEK. 13 tablet 3   Semaglutide,0.25 or 0.5MG/DOS, 2 MG/3ML SOPN Inject 0.25 mg into the skin once a week. 3 mL 1   butalbital-acetaminophen-caffeine (FIORICET) 50-325-40 MG tablet Take        1 to 2 tablets    4 x  /day as needed for Pain or Headache (Patient not taking: Reported on 12/23/2022) 30 tablet 0   diclofenac sodium (VOLTAREN) 1 % GEL Apply 2 g topically 4 (four) times daily. 100 g 3   No current facility-administered medications on file prior to visit.   Allergies:  Allergies  Allergen Reactions   Amlodipine Swelling   Naproxen Hives   Meloxicam & Diet Manage Prod Rash   Medical History:  She has Hypertension; Hyperlipidemia associated with type 2 diabetes mellitus ([place]); Type 2 diabetes mellitus with obesity ([place]); Environmental allergies; Obesity hypoventilation syndrome ([place]) ; Goiter; Vitamin D deficiency; Obesity (BMI 30.0-34.9); Tortuous aorta ([place]); Carpal tunnel syndrome, left upper limb; S/P small bowel resection; B12 deficiency; and OSA (obstructive sleep apnea) on their problem list.  Health Maintenance:   Immunization History  Administered Date(s) Administered   COVID-19, mRNA, vaccine(Comirnaty)12 years and older 11/24/2022   Influenza Inj Mdck Quad With Preservative 07/17/2018, 08/15/2019, 08/21/2020   Influenza Split 07/18/2013, 10/01/2014   Influenza,inj,Quad PF,6+ Mos 09/10/2021, 09/10/2022   Influenza,inj,quad, With Preservative 09/02/2016   PFIZER(Purple Top)SARS-COV-2 Vaccination 01/05/2020, 02/12/2020   Pneumococcal Polysaccharide-23 06/16/1999   Tdap 03/10/2009, 07/18/2019      Patient Care Team: Unk Pinto, MD as PCP - General (Internal Medicine) Mcarthur Rossetti, MD as Consulting Physician (Orthopedic Surgery)  Surgical History:  She has a past surgical history that includes Eye surgery; achiles tendon; Abdominal hysterectomy; Tonsillectomy; and laparotomy (N/A, 07/27/2020). Family History:  Herfamily history includes Diabetes in her father and paternal grandmother; Heart disease in her father, paternal grandmother, and sister; Hypertension in her brother, mother, and sister. Social History:  She reports that she has never smoked. She has never used  smokeless tobacco. She reports current alcohol use. She reports that she does not use drugs.  Review of Systems: Review of Systems  Constitutional:  Negative for chills, fever and malaise/fatigue.  HENT:  Negative for congestion, ear pain and sore throat.   Eyes: Negative.   Respiratory:  Negative for cough, shortness of breath and wheezing.   Cardiovascular:  Negative for chest pain, palpitations and leg swelling.  Gastrointestinal:  Negative for abdominal pain, blood in stool, constipation, diarrhea, heartburn and melena.  Genitourinary: Negative.   Musculoskeletal:  Negative for back pain, falls, joint pain, myalgias and neck pain.  Skin: Negative.   Neurological:  Negative for dizziness, sensory change, loss of consciousness and headaches.  Psychiatric/Behavioral:  Negative for depression. The patient is not nervous/anxious and does not have insomnia.     Physical Exam: Estimated body mass index is 38.01 kg/m as calculated from the following:   Height as of this encounter: 5' 3.5" (1.613 m).   Weight as of this encounter: 218 lb (98.9 kg). BP 136/68   Pulse 64   Temp 97.9 F (36.6 C)   Ht 5' 3.5" (1.613 m)   Wt 218 lb (98.9 kg)   SpO2 98%   BMI 38.01 kg/m  General Appearance: Well nourished, in no apparent distress.  Eyes: PERRLA, EOMs, conjunctiva no swelling or  erythema Sinuses: No Frontal/maxillary tenderness  ENT/Mouth: Ext aud canals clear, normal light reflex with TMs without erythema, bulging. Good dentition. No erythema, swelling, or exudate on post pharynx. Tonsils not swollen or erythematous. Hearing normal.  Neck: Supple, thyroid mildly enlarged on right. No bruits  Respiratory: Respiratory effort normal, BS equal bilaterally without rales, rhonchi, wheezing or stridor.  Cardio: RRR, no murmur, without rubs or gallops. Brisk peripheral pulses without ankle edema, mild foot mild non-pitting edema Chest: symmetric, with normal excursions and percussion.  Breasts: Getting annual mammogram, no concerns, regular self checks, declines manual today  Abdomen: Soft, nontender, no guarding, rebound, hernias, masses, or organomegaly.  Lymphatics: Non tender without lymphadenopathy.  Genitourinary: Defer to GYN Musculoskeletal: Full ROM all peripheral extremities,5/5 strength, and normal gait.  Skin: Warm, dry without rashes, lesions, ecchymosis.  Neuro: Cranial nerves intact, reflexes equal bilaterally. Normal muscle tone, no cerebellar symptoms. Sensation intact. Sensation intact to monofilament bilateral feet.  Psych: Awake and oriented X 3, normal affect, Insight and Judgment appropriate.    Darrol Jump, NP 10:26 AM [facility]

## 2022-12-23 NOTE — Patient Instructions (Signed)

## 2022-12-24 ENCOUNTER — Other Ambulatory Visit: Payer: Self-pay | Admitting: Nurse Practitioner

## 2022-12-24 ENCOUNTER — Ambulatory Visit: Payer: BC Managed Care – PPO | Admitting: Physical Medicine and Rehabilitation

## 2022-12-24 DIAGNOSIS — R202 Paresthesia of skin: Secondary | ICD-10-CM

## 2022-12-24 DIAGNOSIS — M79642 Pain in left hand: Secondary | ICD-10-CM

## 2022-12-24 DIAGNOSIS — M79641 Pain in right hand: Secondary | ICD-10-CM

## 2022-12-24 DIAGNOSIS — M79602 Pain in left arm: Secondary | ICD-10-CM | POA: Diagnosis not present

## 2022-12-24 DIAGNOSIS — D509 Iron deficiency anemia, unspecified: Secondary | ICD-10-CM

## 2022-12-24 NOTE — Progress Notes (Unsigned)
Functional Pain Scale - descriptive words and definitions  No Pain (0)   No Pain/Loss of function  Average Pain 0  Right handed. Left hand worse. Numbness and tingling in both hands, no radiation up the arms

## 2022-12-25 LAB — CBC WITH DIFFERENTIAL/PLATELET
Absolute Monocytes: 407 cells/uL (ref 200–950)
Basophils Absolute: 41 cells/uL (ref 0–200)
Basophils Relative: 0.7 %
Eosinophils Absolute: 77 cells/uL (ref 15–500)
Eosinophils Relative: 1.3 %
HCT: 36 % (ref 35.0–45.0)
Hemoglobin: 11.6 g/dL — ABNORMAL LOW (ref 11.7–15.5)
Lymphs Abs: 2974 cells/uL (ref 850–3900)
MCH: 25.8 pg — ABNORMAL LOW (ref 27.0–33.0)
MCHC: 32.2 g/dL (ref 32.0–36.0)
MCV: 80 fL (ref 80.0–100.0)
MPV: 10.5 fL (ref 7.5–12.5)
Monocytes Relative: 6.9 %
Neutro Abs: 2401 cells/uL (ref 1500–7800)
Neutrophils Relative %: 40.7 %
Platelets: 408 10*3/uL — ABNORMAL HIGH (ref 140–400)
RBC: 4.5 10*6/uL (ref 3.80–5.10)
RDW: 14 % (ref 11.0–15.0)
Total Lymphocyte: 50.4 %
WBC: 5.9 10*3/uL (ref 3.8–10.8)

## 2022-12-25 LAB — COMPLETE METABOLIC PANEL WITH GFR
AG Ratio: 1.5 (calc) (ref 1.0–2.5)
ALT: 11 U/L (ref 6–29)
AST: 13 U/L (ref 10–35)
Albumin: 4 g/dL (ref 3.6–5.1)
Alkaline phosphatase (APISO): 55 U/L (ref 37–153)
BUN: 14 mg/dL (ref 7–25)
CO2: 29 mmol/L (ref 20–32)
Calcium: 9.3 mg/dL (ref 8.6–10.4)
Chloride: 102 mmol/L (ref 98–110)
Creat: 0.51 mg/dL (ref 0.50–1.05)
Globulin: 2.7 g/dL (calc) (ref 1.9–3.7)
Glucose, Bld: 93 mg/dL (ref 65–99)
Potassium: 4.7 mmol/L (ref 3.5–5.3)
Sodium: 139 mmol/L (ref 135–146)
Total Bilirubin: 0.2 mg/dL (ref 0.2–1.2)
Total Protein: 6.7 g/dL (ref 6.1–8.1)
eGFR: 105 mL/min/{1.73_m2} (ref >=60)

## 2022-12-25 LAB — IRON,TIBC AND FERRITIN PANEL
%SAT: 14 % (calc) — ABNORMAL LOW (ref 16–45)
Ferritin: 6 ng/mL — ABNORMAL LOW (ref 16–288)
Iron: 54 ug/dL (ref 45–160)
TIBC: 394 mcg/dL (calc) (ref 250–450)

## 2022-12-25 LAB — LIPID PANEL
Cholesterol: 173 mg/dL (ref <200)
HDL: 105 mg/dL (ref >=50)
LDL Cholesterol (Calc): 54 mg/dL (calc)
Non-HDL Cholesterol (Calc): 68 mg/dL (calc) (ref <130)
Total CHOL/HDL Ratio: 1.6 (calc) (ref <5.0)
Triglycerides: 56 mg/dL (ref <150)

## 2022-12-25 LAB — VITAMIN D 25 HYDROXY (VIT D DEFICIENCY, FRACTURES): Vit D, 25-Hydroxy: 58 ng/mL (ref 30–100)

## 2022-12-25 LAB — HEMOGLOBIN A1C
Hgb A1c MFr Bld: 6.3 % of total Hgb — ABNORMAL HIGH (ref <5.7)
Mean Plasma Glucose: 134 mg/dL
eAG (mmol/L): 7.4 mmol/L

## 2022-12-25 LAB — TEST AUTHORIZATION

## 2022-12-25 LAB — TSH: TSH: 0.54 mIU/L (ref 0.40–4.50)

## 2022-12-27 ENCOUNTER — Other Ambulatory Visit: Payer: Self-pay | Admitting: Nurse Practitioner

## 2022-12-27 DIAGNOSIS — D500 Iron deficiency anemia secondary to blood loss (chronic): Secondary | ICD-10-CM

## 2022-12-27 MED ORDER — FERROUS SULFATE 325 (65 FE) MG PO TABS: 325.0000 mg | ORAL_TABLET | Freq: Every day | ORAL | 0 refills | Status: DC

## 2022-12-30 ENCOUNTER — Encounter: Payer: Self-pay | Admitting: Physical Medicine and Rehabilitation

## 2022-12-30 NOTE — Procedures (Signed)
EMG & NCV Findings: Evaluation of the left median motor and the right median motor nerves showed prolonged distal onset latency (L5.2, R4.8 ms) and decreased conduction velocity (Elbow-Wrist, L47, R43 m/s).  The left median (across palm) sensory nerve showed prolonged distal peak latency (Wrist, 7.2 ms), reduced amplitude (5.5 V), and prolonged distal peak latency (Palm, 6.0 ms).  The right median (across palm) sensory nerve showed prolonged distal peak latency (Wrist, 5.8 ms).  All remaining nerves (as indicated in the following tables) were within normal limits.  Left vs. Right side comparison data for the ulnar motor nerve indicates abnormal L-R amplitude difference (27.2 %).  All remaining left vs. right side differences were within normal limits.    All examined muscles (as indicated in the following table) showed no evidence of electrical instability.    Impression: The above electrodiagnostic study is ABNORMAL and reveals evidence of:  A severe left median nerve entrapment at the wrist (carpal tunnel syndrome) affecting sensory and motor components.   A moderate right median nerve entrapment at the wrist (carpal tunnel syndrome) affecting sensory and motor components.  There is no significant electrodiagnostic evidence of any other focal nerve entrapment, brachial plexopathy or cervical radiculopathy.   Recommendations: 1.  Follow-up with referring physician. 2.  Continue current management of symptoms. 3.  Continue use of resting splint at night-time and as needed during the day. 4.  Suggest surgical evaluation.  ___________________________ Laurence Spates FAAPMR Board Certified, American Board of Physical Medicine and Rehabilitation    Nerve Conduction Studies Anti Sensory Summary Table   Stim Site NR Peak (ms) Norm Peak (ms) P-T Amp (V) Norm P-T Amp Site1 Site2 Delta-P (ms) Dist (cm) Vel (m/s) Norm Vel (m/s)  Left Median Acr Palm Anti Sensory (2nd Digit)  30.9C  Wrist    *7.2  <3.6 *5.5 >10 Wrist Palm 1.2 0.0    Palm    *6.0 <2.0 4.5         Right Median Acr Palm Anti Sensory (2nd Digit)  29.7C  Wrist    *5.8 <3.6 19.1 >10 Wrist Palm 4.5 0.0    Palm    1.3 <2.0 5.2         Left Radial Anti Sensory (Base 1st Digit)  31.2C  Wrist    2.3 <3.1 28.0  Wrist Base 1st Digit 2.3 0.0    Right Radial Anti Sensory (Base 1st Digit)  30.2C  Wrist    2.1 <3.1 25.4  Wrist Base 1st Digit 2.1 0.0    Left Ulnar Anti Sensory (5th Digit)  31.3C  Wrist    3.3 <3.7 26.9 >15.0 Wrist 5th Digit 3.3 14.0 42 >38  Right Ulnar Anti Sensory (5th Digit)  30.3C  Wrist    3.5 <3.7 24.7 >15.0 Wrist 5th Digit 3.5 14.0 40 >38   Motor Summary Table   Stim Site NR Onset (ms) Norm Onset (ms) O-P Amp (mV) Norm O-P Amp Site1 Site2 Delta-0 (ms) Dist (cm) Vel (m/s) Norm Vel (m/s)  Left Median Motor (Abd Poll Brev)  31.3C  Wrist    *5.2 <4.2 5.7 >5 Elbow Wrist 4.3 20.0 *47 >50  Elbow    9.5  4.4         Right Median Motor (Abd Poll Brev)  30.5C  Wrist    *4.8 <4.2 5.0 >5 Elbow Wrist 5.2 22.5 *43 >50  Elbow    10.0  3.9         Left Ulnar Motor (Abd Dig Min)  31.5C  Wrist    2.9 <4.2 6.7 >3 B Elbow Wrist 3.1 19.5 63 >53  B Elbow    6.0  6.5  A Elbow B Elbow 1.2 10.0 83 >53  A Elbow    7.2  6.0         Right Ulnar Motor (Abd Dig Min)  30.9C  Wrist    3.1 <4.2 9.2 >3 B Elbow Wrist 3.2 19.5 61 >53  B Elbow    6.3  7.8  A Elbow B Elbow 1.4 10.0 71 >53  A Elbow    7.7  7.6          EMG   Side Muscle Nerve Root Ins Act Fibs Psw Amp Dur Poly Recrt Int Fraser Din Comment  Left Abd Poll Brev Median C8-T1 Nml Nml Nml Nml Nml 0 Nml Nml   Left 1stDorInt Ulnar C8-T1 Nml Nml Nml Nml Nml 0 Nml Nml   Left PronatorTeres Median C6-7 Nml Nml Nml Nml Nml 0 Nml Nml   Left Biceps Musculocut C5-6 Nml Nml Nml Nml Nml 0 Nml Nml   Left Deltoid Axillary C5-6 Nml Nml Nml Nml Nml 0 Nml Nml     Nerve Conduction Studies Anti Sensory Left/Right Comparison   Stim Site L Lat (ms) R Lat (ms) L-R Lat (ms) L Amp (V) R Amp  (V) L-R Amp (%) Site1 Site2 L Vel (m/s) R Vel (m/s) L-R Vel (m/s)  Median Acr Palm Anti Sensory (2nd Digit)  30.9C  Wrist *7.2 *5.8 1.4 *5.5 19.1 71.2 Wrist Palm     Palm *6.0 1.3 4.7 4.5 5.2 13.5       Radial Anti Sensory (Base 1st Digit)  31.2C  Wrist 2.3 2.1 0.2 28.0 25.4 9.3 Wrist Base 1st Digit     Ulnar Anti Sensory (5th Digit)  31.3C  Wrist 3.3 3.5 0.2 26.9 24.7 8.2 Wrist 5th Digit 42 40 2   Motor Left/Right Comparison   Stim Site L Lat (ms) R Lat (ms) L-R Lat (ms) L Amp (mV) R Amp (mV) L-R Amp (%) Site1 Site2 L Vel (m/s) R Vel (m/s) L-R Vel (m/s)  Median Motor (Abd Poll Brev)  31.3C  Wrist *5.2 *4.8 0.4 5.7 5.0 12.3 Elbow Wrist *47 *43 4  Elbow 9.5 10.0 0.5 4.4 3.9 11.4       Ulnar Motor (Abd Dig Min)  31.5C  Wrist 2.9 3.1 0.2 6.7 9.2 *27.2 B Elbow Wrist 63 61 2  B Elbow 6.0 6.3 0.3 6.5 7.8 16.7 A Elbow B Elbow 83 71 12  A Elbow 7.2 7.7 0.5 6.0 7.6 21.1          Waveforms:

## 2022-12-30 NOTE — Progress Notes (Signed)
Tanya [name] - 62 y.o. female MRN [id number]  Date of birth: 04-26-1961  Office Visit Note: Visit Date: 12/24/2022 PCP: Unk Pinto, MD Referred by: Mcarthur Rossetti*  Subjective: Chief Complaint  Patient presents with   Right Hand - Numbness, Pain   Left Hand - Numbness, Pain   HPI:  Tanya [name] is a 62 y.o. female who comes in today at the request of Dr. Jean Rosenthal for evaluation and management of chronic, worsening and severe pain, numbness and tingling in the Bilateral upper extremities.  Patient is Right hand dominant.  She reports chronic worsening severe bilateral hand pain with numbness and tingling particularly in the radial digits bilaterally.  Somewhat nondermatomal at times.  She does have type 2 diabetes with fairly consistent hemoglobin A1c's in the low 6 range.  No history of polyneuropathy.  She reports left-sided symptoms worse than right.  No frank radicular symptoms.  She did have prior electrodiagnostic study in our office in 2020 showing moderate left median nerve neuropathy.  Those results are detailed at the bottom of the note.   Review of Systems  Musculoskeletal:  Positive for joint pain.  Neurological:  Positive for tingling and weakness.  All other systems reviewed and are negative.  Otherwise per HPI.  Assessment & Plan: Visit Diagnoses:    ICD-10-CM   1. Paresthesia of skin  R20.2 NCV with EMG (electromyography)    2. Bilateral hand pain  M79.641    M79.642     3. Left arm pain  M79.602       Plan: Impression: Bilateral hand pain with numbness and tingling most consistent with carpal tunnel syndrome but cannot rule out a peripheral polyneuropathy with history of diabetes.  Prior electrodiagnostic study in 2020 did reveal moderate median neuropathy on the left and the right side was not tested.  At the time that did not show any indication of polyneuropathy but this usually occurs in the feet first.  She denies any real  radicular complaints.  Electrodiagnostic study performed.  The above electrodiagnostic study is ABNORMAL and reveals evidence of:  A severe left median nerve entrapment at the wrist (carpal tunnel syndrome) affecting sensory and motor components.   A moderate right median nerve entrapment at the wrist (carpal tunnel syndrome) affecting sensory and motor components.  There is no significant electrodiagnostic evidence of any other focal nerve entrapment, brachial plexopathy or cervical radiculopathy.   Recommendations: 1.  Follow-up with referring physician. 2.  Continue current management of symptoms. 3.  Continue use of resting splint at night-time and as needed during the day. 4.  Suggest surgical evaluation.  Meds & Orders: No orders of the defined types were placed in this encounter.   Orders Placed This Encounter  Procedures   NCV with EMG (electromyography)    Follow-up: Return for Jean Rosenthal, MD.   Procedures: No procedures performed  EMG & NCV Findings: Evaluation of the left median motor and the right median motor nerves showed prolonged distal onset latency (L5.2, R4.8 ms) and decreased conduction velocity (Elbow-Wrist, L47, R43 m/s).  The left median (across palm) sensory nerve showed prolonged distal peak latency (Wrist, 7.2 ms), reduced amplitude (5.5 V), and prolonged distal peak latency (Palm, 6.0 ms).  The right median (across palm) sensory nerve showed prolonged distal peak latency (Wrist, 5.8 ms).  All remaining nerves (as indicated in the following tables) were within normal limits.  Left vs. Right side comparison data for the ulnar motor nerve indicates  abnormal L-R amplitude difference (27.2 %).  All remaining left vs. right side differences were within normal limits.    All examined muscles (as indicated in the following table) showed no evidence of electrical instability.    Impression: The above electrodiagnostic study is ABNORMAL and reveals evidence  of:  A severe left median nerve entrapment at the wrist (carpal tunnel syndrome) affecting sensory and motor components.   A moderate right median nerve entrapment at the wrist (carpal tunnel syndrome) affecting sensory and motor components.  There is no significant electrodiagnostic evidence of any other focal nerve entrapment, brachial plexopathy or cervical radiculopathy.   Recommendations: 1.  Follow-up with referring physician. 2.  Continue current management of symptoms. 3.  Continue use of resting splint at night-time and as needed during the day. 4.  Suggest surgical evaluation.  ___________________________ Laurence Spates FAAPMR Board Certified, American Board of Physical Medicine and Rehabilitation    Nerve Conduction Studies Anti Sensory Summary Table   Stim Site NR Peak (ms) Norm Peak (ms) P-T Amp (V) Norm P-T Amp Site1 Site2 Delta-P (ms) Dist (cm) Vel (m/s) Norm Vel (m/s)  Left Median Acr Palm Anti Sensory (2nd Digit)  30.9C  Wrist    *7.2 <3.6 *5.5 >10 Wrist Palm 1.2 0.0    Palm    *6.0 <2.0 4.5         Right Median Acr Palm Anti Sensory (2nd Digit)  29.7C  Wrist    *5.8 <3.6 19.1 >10 Wrist Palm 4.5 0.0    Palm    1.3 <2.0 5.2         Left Radial Anti Sensory (Base 1st Digit)  31.2C  Wrist    2.3 <3.1 28.0  Wrist Base 1st Digit 2.3 0.0    Right Radial Anti Sensory (Base 1st Digit)  30.2C  Wrist    2.1 <3.1 25.4  Wrist Base 1st Digit 2.1 0.0    Left Ulnar Anti Sensory (5th Digit)  31.3C  Wrist    3.3 <3.7 26.9 >15.0 Wrist 5th Digit 3.3 14.0 42 >38  Right Ulnar Anti Sensory (5th Digit)  30.3C  Wrist    3.5 <3.7 24.7 >15.0 Wrist 5th Digit 3.5 14.0 40 >38   Motor Summary Table   Stim Site NR Onset (ms) Norm Onset (ms) O-P Amp (mV) Norm O-P Amp Site1 Site2 Delta-0 (ms) Dist (cm) Vel (m/s) Norm Vel (m/s)  Left Median Motor (Abd Poll Brev)  31.3C  Wrist    *5.2 <4.2 5.7 >5 Elbow Wrist 4.3 20.0 *47 >50  Elbow    9.5  4.4         Right Median Motor (Abd Poll  Brev)  30.5C  Wrist    *4.8 <4.2 5.0 >5 Elbow Wrist 5.2 22.5 *43 >50  Elbow    10.0  3.9         Left Ulnar Motor (Abd Dig Min)  31.5C  Wrist    2.9 <4.2 6.7 >3 B Elbow Wrist 3.1 19.5 63 >53  B Elbow    6.0  6.5  A Elbow B Elbow 1.2 10.0 83 >53  A Elbow    7.2  6.0         Right Ulnar Motor (Abd Dig Min)  30.9C  Wrist    3.1 <4.2 9.2 >3 B Elbow Wrist 3.2 19.5 61 >53  B Elbow    6.3  7.8  A Elbow B Elbow 1.4 10.0 71 >53  A Elbow    7.7  7.6          EMG   Side Muscle Nerve Root Ins Act Fibs Psw Amp Dur Poly Recrt Int Fraser Din Comment  Left Abd Poll Brev Median C8-T1 Nml Nml Nml Nml Nml 0 Nml Nml   Left 1stDorInt Ulnar C8-T1 Nml Nml Nml Nml Nml 0 Nml Nml   Left PronatorTeres Median C6-7 Nml Nml Nml Nml Nml 0 Nml Nml   Left Biceps Musculocut C5-6 Nml Nml Nml Nml Nml 0 Nml Nml   Left Deltoid Axillary C5-6 Nml Nml Nml Nml Nml 0 Nml Nml     Nerve Conduction Studies Anti Sensory Left/Right Comparison   Stim Site L Lat (ms) R Lat (ms) L-R Lat (ms) L Amp (V) R Amp (V) L-R Amp (%) Site1 Site2 L Vel (m/s) R Vel (m/s) L-R Vel (m/s)  Median Acr Palm Anti Sensory (2nd Digit)  30.9C  Wrist *7.2 *5.8 1.4 *5.5 19.1 71.2 Wrist Palm     Palm *6.0 1.3 4.7 4.5 5.2 13.5       Radial Anti Sensory (Base 1st Digit)  31.2C  Wrist 2.3 2.1 0.2 28.0 25.4 9.3 Wrist Base 1st Digit     Ulnar Anti Sensory (5th Digit)  31.3C  Wrist 3.3 3.5 0.2 26.9 24.7 8.2 Wrist 5th Digit 42 40 2   Motor Left/Right Comparison   Stim Site L Lat (ms) R Lat (ms) L-R Lat (ms) L Amp (mV) R Amp (mV) L-R Amp (%) Site1 Site2 L Vel (m/s) R Vel (m/s) L-R Vel (m/s)  Median Motor (Abd Poll Brev)  31.3C  Wrist *5.2 *4.8 0.4 5.7 5.0 12.3 Elbow Wrist *47 *43 4  Elbow 9.5 10.0 0.5 4.4 3.9 11.4       Ulnar Motor (Abd Dig Min)  31.5C  Wrist 2.9 3.1 0.2 6.7 9.2 *27.2 B Elbow Wrist 63 61 2  B Elbow 6.0 6.3 0.3 6.5 7.8 16.7 A Elbow B Elbow 83 71 12  A Elbow 7.2 7.7 0.5 6.0 7.6 21.1          Waveforms:                       Clinical History: Electrodiagnostic study of the left upper limb 12/21/2018 Impression: The above electrodiagnostic study is ABNORMAL and reveals evidence of a moderate left median nerve entrapment at the wrist (carpal tunnel syndrome) affecting sensory and motor components.    There is no significant electrodiagnostic evidence of any other focal nerve entrapment, brachial plexopathy or cervical radiculopathy.    Recommendations: 1.  Follow-up with referring physician. 2.  Continue current management of symptoms. 3.  Continue use of resting splint at night-time and as needed during the day. 4.  Suggest surgical evaluation.     Objective:  VS:  HT:    WT:   BMI:     BP:   HR: bpm  TEMP: ( )  RESP:  Physical Exam Vitals and nursing note reviewed.  Constitutional:      General: She is not in acute distress.    Appearance: Normal appearance. She is well-developed. She is not ill-appearing.  HENT:     Head: Normocephalic and atraumatic.  Eyes:     Conjunctiva/sclera: Conjunctivae normal.     Pupils: Pupils are equal, round, and reactive to light.  Cardiovascular:     Rate and Rhythm: Normal rate.     Pulses: Normal pulses.  Pulmonary:     Effort: Pulmonary effort is normal.  Musculoskeletal:  General: No swelling, tenderness or deformity.     Right lower leg: No edema.     Left lower leg: No edema.     Comments: Inspection reveals no atrophy of the bilateral APB or FDI or hand intrinsics. There is no swelling, color changes, allodynia or dystrophic changes. There is 5 out of 5 strength in the bilateral wrist extension, finger abduction and long finger flexion. There is intact sensation to light touch in all dermatomal and peripheral nerve distributions but some dysesthesia in the median nerve distribution on the left more than right. There is a negative Froment's test bilaterally.  There is a negative Phalen's test bilaterally. There is a negative Hoffmann's test  bilaterally.  Skin:    General: Skin is warm and dry.     Findings: No erythema or rash.  Neurological:     General: No focal deficit present.     Mental Status: She is alert and oriented to person, place, and time.     Cranial Nerves: No cranial nerve deficit.     Sensory: Sensory deficit present.     Motor: No weakness or abnormal muscle tone.     Coordination: Coordination normal.     Gait: Gait normal.  Psychiatric:        Mood and Affect: Mood normal.        Behavior: Behavior normal.      Imaging: No results found.

## 2023-01-06 ENCOUNTER — Other Ambulatory Visit: Payer: Self-pay | Admitting: Internal Medicine

## 2023-01-19 ENCOUNTER — Ambulatory Visit: Payer: BC Managed Care – PPO | Admitting: Orthopaedic Surgery

## 2023-01-19 ENCOUNTER — Encounter: Payer: Self-pay | Admitting: Orthopaedic Surgery

## 2023-01-19 DIAGNOSIS — G5602 Carpal tunnel syndrome, left upper limb: Secondary | ICD-10-CM | POA: Diagnosis not present

## 2023-01-19 DIAGNOSIS — G5601 Carpal tunnel syndrome, right upper limb: Secondary | ICD-10-CM

## 2023-01-19 DIAGNOSIS — G5603 Carpal tunnel syndrome, bilateral upper limbs: Secondary | ICD-10-CM

## 2023-01-19 NOTE — Progress Notes (Signed)
The patient is a 62 year old female who comes in today to go over EMG/nerve conduction studies as a relates to bilateral hand numbness and tingling.  The nerve conduction studies were reviewed with her.  It does show that she has severe left carpal tunnel syndrome and moderate right carpal tunnel syndrome.  There is definitely compression of the median nerve across the transverse carpal ligament on both sides.  Her numbness and tingling is definitely worse on the left than the right.  She has weak pinch and grip strength on the left side.  There is no muscle atrophy on either hand.  She has positive Phalen's and Tinel's exams on both sides.  Had a long and thorough discussion with her about carpal tunnel surgery and what this involves.  We have recommended this for her left side for since this is more symptomatic and the severity is quite significant.  I discussed the risks and benefits of the surgery and what to expect from an intraoperative and postoperative course.  She would like to wait until June until she gets back from a vacation in late May and I agree with this as well.  We will work on getting this scheduled and be in touch.  We would then see her back in 2 weeks postoperative.  All question concerns were answered and addressed.

## 2023-01-26 ENCOUNTER — Other Ambulatory Visit: Payer: Self-pay | Admitting: Nurse Practitioner

## 2023-01-26 DIAGNOSIS — E1169 Type 2 diabetes mellitus with other specified complication: Secondary | ICD-10-CM

## 2023-01-26 DIAGNOSIS — E662 Morbid (severe) obesity with alveolar hypoventilation: Secondary | ICD-10-CM

## 2023-02-02 ENCOUNTER — Encounter: Payer: Self-pay | Admitting: Nurse Practitioner

## 2023-02-04 DIAGNOSIS — E119 Type 2 diabetes mellitus without complications: Secondary | ICD-10-CM | POA: Diagnosis not present

## 2023-02-04 DIAGNOSIS — H53143 Visual discomfort, bilateral: Secondary | ICD-10-CM | POA: Diagnosis not present

## 2023-02-06 MED ORDER — SEMAGLUTIDE (1 MG/DOSE) 4 MG/3ML ~~LOC~~ SOPN: 1.0000 mg | PEN_INJECTOR | SUBCUTANEOUS | 1 refills | Status: DC

## 2023-03-02 ENCOUNTER — Ambulatory Visit (INDEPENDENT_AMBULATORY_CARE_PROVIDER_SITE_OTHER): Payer: BC Managed Care – PPO | Admitting: Nurse Practitioner

## 2023-03-02 ENCOUNTER — Encounter: Payer: Self-pay | Admitting: Nurse Practitioner

## 2023-03-02 VITALS — BP 150/70 | HR 72 | Temp 97.6°F | Ht 63.5 in | Wt 212.8 lb

## 2023-03-02 DIAGNOSIS — I10 Essential (primary) hypertension: Secondary | ICD-10-CM | POA: Diagnosis not present

## 2023-03-02 DIAGNOSIS — M5432 Sciatica, left side: Secondary | ICD-10-CM | POA: Diagnosis not present

## 2023-03-02 DIAGNOSIS — E1169 Type 2 diabetes mellitus with other specified complication: Secondary | ICD-10-CM

## 2023-03-02 DIAGNOSIS — E669 Obesity, unspecified: Secondary | ICD-10-CM

## 2023-03-02 DIAGNOSIS — E66811 Obesity, class 1: Secondary | ICD-10-CM

## 2023-03-02 MED ORDER — PREDNISONE 10 MG PO TABS
ORAL_TABLET | ORAL | 0 refills | Status: DC
Start: 2023-03-02 — End: 2023-05-04

## 2023-03-02 NOTE — Patient Instructions (Signed)
Sciatica  Sciatica is pain, weakness, tingling, or loss of feeling (numbness) along the sciatic nerve. The sciatic nerve starts in the lower back and goes down the back of each leg. Sciatica usually affects one side of the body. Sciatica usually goes away on its own or with treatment. Sometimes, sciatica may come back. What are the causes? This condition happens when the sciatic nerve is pinched or has pressure put on it. This may be caused by: A disk in between the bones of the spine bulging out too far (herniated disk). Changes in the spinal disks due to aging. A condition that affects a muscle in the butt. Extra bone growth near the sciatic nerve. A break (fracture) of the area between your hip bones (pelvis). Pregnancy. Tumor. This is rare. What increases the risk? You are more likely to develop this condition if you: Play sports that put pressure or stress on the spine. Have poor strength and ease of movement (flexibility). Have had a back injury or back surgery. Sit for long periods of time. Do activities that involve bending or lifting over and over again. Are very overweight (obese). What are the signs or symptoms? Symptoms can vary from mild to very bad. They may include: Any of these problems in the lower back, leg, hip, or butt: Mild tingling, loss of feeling, or dull aches. A burning feeling. Sharp pains. Loss of feeling in the back of the calf or the sole of the foot. Leg weakness. Very bad back pain that makes it hard to move. These symptoms may get worse when you cough, sneeze, or laugh. They may also get worse when you sit or stand for long periods of time. How is this treated? This condition often gets better without any treatment. However, treatment may include: Changing or cutting back on physical activity when you have pain. Exercising, including strengthening and stretching. Putting ice or heat on the affected area. Shots of medicines to relieve pain and  swelling or to relax your muscles. Surgery. Follow these instructions at home: Medicines Take over-the-counter and prescription medicines only as told by your doctor. Ask your doctor if you should avoid driving or using machines while you are taking your medicine. Managing pain     If told, put ice on the affected area. To do this: Put ice in a plastic bag. Place a towel between your skin and the bag. Leave the ice on for 20 minutes, 2-3 times a day. If your skin turns bright red, take off the ice right away to prevent skin damage. The risk of skin damage is higher if you cannot feel pain, heat, or cold. If told, put heat on the affected area. Do this as often as told by your doctor. Use the heat source that your doctor tells you to use, such as a moist heat pack or a heating pad. Place a towel between your skin and the heat source. Leave the heat on for 20-30 minutes. If your skin turns bright red, take off the heat right away to prevent burns. The risk of burns is higher if you cannot feel pain, heat, or cold. Activity  Return to your normal activities when your doctor says that it is safe. Avoid activities that make your symptoms worse. Take short rests during the day. When you rest for a long time, do some physical activity or stretching between periods of rest. Avoid sitting for a long time without moving. Get up and move around at least one time each   hour. Do exercises and stretches as told by your doctor. Do not lift anything that is heavier than 10 lb (4.5 kg). Avoid lifting heavy things even when you do not have symptoms. Avoid lifting heavy things over and over. When you lift objects, always lift in a way that is safe for your body. To do this, you should: Bend your knees. Keep the object close to your body. Avoid twisting. General instructions Stay at a healthy weight. Wear comfortable shoes that support your feet. Avoid wearing high heels. Avoid sleeping on a mattress  that is too soft or too hard. You might have less pain if you sleep on a mattress that is firm enough to support your back. Contact a doctor if: Your pain is not controlled by medicine. Your pain does not get better. Your pain gets worse. Your pain lasts longer than 4 weeks. You lose weight without trying. Get help right away if: You cannot control when you pee (urinate) or poop (have a bowel movement). You have weakness in any of these areas and it gets worse: Lower back. The area between your hip bones. Butt. Legs. You have redness or swelling of your back. You have a burning feeling when you pee. Summary Sciatica is pain, weakness, tingling, or loss of feeling (numbness) along the sciatic nerve. This may include the lower back, legs, hips, and butt. This condition happens when the sciatic nerve is pinched or has pressure put on it. Treatment often includes rest, exercise, medicines, and putting ice or heat on the affected area. This information is not intended to replace advice given to you by your health care provider. Make sure you discuss any questions you have with your health care provider. Document Revised: 01/25/2022 Document Reviewed: 01/25/2022 [employer] Patient Education  2023 [employer]  

## 2023-03-02 NOTE — Progress Notes (Signed)
Follow Up  Assessment and Plan:  Left Side Sciatica Start Prednisone taper Continue Gabapentin PRN Established with Dr. Magnus Ivan Improved with walking and weight loss RICE Method when flared to left foot RTC for further evaluation and possible imaging if no improvement.  Primar hypertension Elevated secondary to pain. Continue medications Discussed DASH (Dietary Approaches to Stop Hypertension) DASH diet is lower in sodium than a typical American diet. Cut back on foods that are high in saturated fat, cholesterol, and trans fats. Eat more whole-grain foods, fish, poultry, and nuts Remain active and exercise as tolerated daily.  Monitor BP at home-Call if greater than 130/80.   Diabetes mellitus without complication (HCC) Continue Ozempic Monitor BG while taking steroid taper. Limit intake of sugars, carbs, starches. Education: Reviewed 'ABCs' of diabetes management  Discussed goals to be met and/or maintained include A1C (<7) Blood pressure (<130/80) Cholesterol (LDL <70) Continue Eye Exam yearly  Continue Dental Exam Q6 mo Discussed dietary recommendations Discussed Physical Activity recommendations Foot exam - Low Risk Check A1C  Obesity Discussed appropriate BMI Goal of losing 1 lb per month. Diet modification. Physical activity. Encouraged/praised to build confidence.   Meds ordered this encounter  Medications   predniSONE (DELTASONE) 10 MG tablet    Sig: 1 tab 3 x day for 2 days, then 1 tab 2 x day for 2 days, then 1 tab 1 x day for 3 days.  Monitor Blood Sugar.    Dispense:  13 tablet    Refill:  0    Order Specific Question:   Supervising Provider    Answer:   Lucky Cowboy [phone]   Notify office for further evaluation and treatment, questions or concerns if any reported s/s fail to improve.   The patient was advised to call back or seek an in-person evaluation if any symptoms worsen or if the condition fails to improve as anticipated.   Further  disposition pending results of labs. Discussed med's effects and SE's.    I discussed the assessment and treatment plan with the patient. The patient was provided an opportunity to ask questions and all were answered. The patient agreed with the plan and demonstrated an understanding of the instructions.  Discussed med's effects and SE's. Screening labs and tests as requested with regular follow-up as recommended.  I provided 15 minutes of face-to-face time during this encounter including counseling, chart review, and critical decision making was preformed.   Future Appointments  Date Time Provider Department Center  04/06/2023  9:30 AM Adela Glimpse, NP [facility] None  05/04/2023  8:30 AM Kirtland Bouchard, PA-C [facility] None  09/12/2023 10:00 AM Adela Glimpse, NP [facility] None    HPI  62 y.o. female, presents for a general follow up. She has Hypertension; Hyperlipidemia associated with type 2 diabetes mellitus (HCC); Type 2 diabetes mellitus with obesity (HCC); Environmental allergies; Obesity hypoventilation syndrome (HCC) ; Goiter; Vitamin D deficiency; Obesity (BMI 30.0-34.9); Tortuous aorta (HCC); Carpal tunnel syndrome, left upper limb; S/P small bowel resection; B12 deficiency; and OSA (obstructive sleep apnea) on their problem list.   She has hx of right hip/back pain, and bilateral knee pain, seeing Dr. Magnus Ivan, but reports symptoms improved with daily walking. However, she has joined a power walking group, walking 3 miles every morning, sometimes using weights and she feels this may have cause increase in left sided sciatica pain although she admits to taking TID over the last few days without improvement.  She is concerned for the flare as she is set  to travel to [place] in a few weeks.  Pain is intermittently.  Can occur when standing, sitting, walking, or lying down.   BMI is Body mass index is 37.1 kg/m., she has been working on diet and exercise.    Wt Readings from Last 3  Encounters:  03/02/23 212 lb 12.8 oz (96.5 kg)  12/23/22 218 lb (98.9 kg)  09/10/22 216 lb 12.8 oz (98.3 kg)   She does check blood pressures at home, recently ranging 130-140s/80s today their BP is BP: (!) 150/70 She denies CP, heart palpitations, dizziness, SOB.  She rates her pain a 4/10 today.  BP Readings from Last 3 Encounters:  03/02/23 (!) 150/70  12/23/22 136/68  09/10/22 (!) 158/70    She has been working on diet and exercise for Diabetes without complications, currently treated by metformin 2000 mg daily and ozempic.  Lab Results  Component Value Date   HGBA1C 6.3 (H) 12/23/2022       Current Medications:  Current Outpatient Medications on File Prior to Visit  Medication Sig Dispense Refill   amLODipine (NORVASC) 2.5 MG tablet TAKE 1 TAB EVERY NIGHT FOR BP 90 tablet 3   aspirin EC 81 MG tablet Take 81 mg by mouth daily.     bisoprolol-hydrochlorothiazide (ZIAC) 10-6.25 MG tablet TAKE 1 TABLET BY MOUTH EVERY DAY FOR BLOOD PRESSURE 90 tablet 3   Cyanocobalamin (VITAMIN B-12 SL) Place 1 tablet under the tongue daily.      ferrous sulfate 325 (65 FE) MG tablet Take 1 tablet (325 mg total) by mouth daily. 90 tablet 0   fluticasone (FLONASE) 50 MCG/ACT nasal spray Place 2 sprays into both nostrils daily. (Patient taking differently: Place 2 sprays into both nostrils daily as needed for allergies.) 16 g 6   furosemide (LASIX) 40 MG tablet TAKE 1 TABLET BY MOUTH TWICE A DAY FOR BP & FLUID RETENTION / ANKLE SWELLING 180 tablet 3   gabapentin (NEURONTIN) 100 MG capsule TAKE 1 CAPSULE 3 TIMES A DAY FOR CHRONIC PAIN 270 capsule 2   hydrALAZINE (APRESOLINE) 25 MG tablet Take  1 tablet  3 x /day  for BP & Heart 270 tablet 3   Magnesium 250 MG TABS Take 250 mg by mouth daily.     metFORMIN (GLUCOPHAGE-XR) 500 MG 24 hr tablet TAKE 2 TABLETS BY MOUTH TWICE A DAY WITH A MEAL FOR DIABETES 360 tablet 3   Multiple Vitamins-Minerals (MULTIVITAMIN PO) Take by mouth daily.     olmesartan  (BENICAR) 40 MG tablet TAKE 1 TABLET DAILY FOR BLOOD PRESSURE & DIABETIC KIDNEY PROTECTION 90 tablet 1   RESTASIS 0.05 % ophthalmic emulsion Place 1 drop into both eyes 3 (three) times daily as needed for dry eyes.     rosuvastatin (CRESTOR) 5 MG tablet TAKE 1 TABLET BY MOUTH ONCE A WEEK. 13 tablet 3   Semaglutide, 1 MG/DOSE, 4 MG/3ML SOPN Inject 1 mg into the skin once a week. 3 mL 1   No current facility-administered medications on file prior to visit.   Allergies:  Allergies  Allergen Reactions   Amlodipine Swelling   Naproxen Hives   Meloxicam & Diet Manage Prod Rash   Medical History:  She has Hypertension; Hyperlipidemia associated with type 2 diabetes mellitus (HCC); Type 2 diabetes mellitus with obesity (HCC); Environmental allergies; Obesity hypoventilation syndrome (HCC) ; Goiter; Vitamin D deficiency; Obesity (BMI 30.0-34.9); Tortuous aorta (HCC); Carpal tunnel syndrome, left upper limb; S/P small bowel resection; B12 deficiency; and OSA (obstructive sleep apnea)  on their problem list.  Health Maintenance:   Immunization History  Administered Date(s) Administered   COVID-19, mRNA, vaccine(Comirnaty)12 years and older 11/24/2022   Influenza Inj Mdck Quad With Preservative 07/17/2018, 08/15/2019, 08/21/2020   Influenza Split 07/18/2013, 10/01/2014   Influenza,inj,Quad PF,6+ Mos 09/10/2021, 09/10/2022   Influenza,inj,quad, With Preservative 09/02/2016   PFIZER(Purple Top)SARS-COV-2 Vaccination 01/05/2020, 02/12/2020   Pneumococcal Polysaccharide-23 06/16/1999   Tdap 03/10/2009, 07/18/2019     Patient Care Team: Lucky Cowboy, MD as PCP - General (Internal Medicine) Kathryne Hitch, MD as Consulting Physician (Orthopedic Surgery)  Surgical History:  She has a past surgical history that includes Eye surgery; achiles tendon; Abdominal hysterectomy; Tonsillectomy; and laparotomy (N/A, 07/27/2020). Family History:  Herfamily history includes Diabetes in her father  and paternal grandmother; Heart disease in her father, paternal grandmother, and sister; Hypertension in her brother, mother, and sister. Social History:  She reports that she has never smoked. She has never used smokeless tobacco. She reports current alcohol use. She reports that she does not use drugs.  Review of Systems: Review of Systems  Constitutional:  Negative for chills, fever and malaise/fatigue.  HENT:  Negative for congestion, ear pain and sore throat.   Eyes: Negative.   Respiratory:  Negative for cough, shortness of breath and wheezing.   Cardiovascular:  Negative for chest pain, palpitations and leg swelling.  Gastrointestinal:  Negative for abdominal pain, blood in stool, constipation, diarrhea, heartburn and melena.  Genitourinary: Negative.   Musculoskeletal:  Negative for back pain, falls, joint pain, myalgias and neck pain.  Skin: Negative.   Neurological:  Negative for dizziness, sensory change, loss of consciousness and headaches.  Psychiatric/Behavioral:  Negative for depression. The patient is not nervous/anxious and does not have insomnia.     Physical Exam: Estimated body mass index is 37.1 kg/m as calculated from the following:   Height as of this encounter: 5' 3.5" (1.613 m).   Weight as of this encounter: 212 lb 12.8 oz (96.5 kg). BP (!) 150/70   Pulse 72   Temp 97.6 F (36.4 C)   Ht 5' 3.5" (1.613 m)   Wt 212 lb 12.8 oz (96.5 kg)   SpO2 99%   BMI 37.10 kg/m  General Appearance: Well nourished, in no apparent distress.  Eyes: PERRLA, EOMs, conjunctiva no swelling or erythema Sinuses: No Frontal/maxillary tenderness  ENT/Mouth: Ext aud canals clear, normal light reflex with TMs without erythema, bulging. Good dentition. No erythema, swelling, or exudate on post pharynx. Tonsils not swollen or erythematous. Hearing normal.  Neck: Supple, thyroid mildly enlarged on right. No bruits  Respiratory: Respiratory effort normal, BS equal bilaterally without  rales, rhonchi, wheezing or stridor.  Cardio: RRR, no murmur, without rubs or gallops. Brisk peripheral pulses without ankle edema, mild foot mild non-pitting edema Chest: symmetric, with normal excursions and percussion.  Breasts: Getting annual mammogram, no concerns, regular self checks, declines manual today  Abdomen: Soft, nontender, no guarding, rebound, hernias, masses, or organomegaly.  Lymphatics: Non tender without lymphadenopathy.  Genitourinary: Defer to GYN Musculoskeletal: Full ROM all peripheral extremities,5/5 strength, and normal gait.  Skin: Warm, dry without rashes, lesions, ecchymosis.  Neuro: Cranial nerves intact, reflexes equal bilaterally. Normal muscle tone, no cerebellar symptoms. Sensation intact. Sensation intact to monofilament bilateral feet.  Psych: Awake and oriented X 3, normal affect, Insight and Judgment appropriate.    Adela Glimpse, NP 3:16 PM [facility]

## 2023-03-19 ENCOUNTER — Other Ambulatory Visit: Payer: Self-pay | Admitting: Nurse Practitioner

## 2023-03-19 DIAGNOSIS — E1169 Type 2 diabetes mellitus with other specified complication: Secondary | ICD-10-CM

## 2023-03-19 DIAGNOSIS — D500 Iron deficiency anemia secondary to blood loss (chronic): Secondary | ICD-10-CM

## 2023-03-23 ENCOUNTER — Encounter: Payer: Self-pay | Admitting: Nurse Practitioner

## 2023-04-06 ENCOUNTER — Ambulatory Visit: Payer: BC Managed Care – PPO | Admitting: Nurse Practitioner

## 2023-04-21 ENCOUNTER — Telehealth: Payer: Self-pay | Admitting: Orthopaedic Surgery

## 2023-04-21 ENCOUNTER — Other Ambulatory Visit: Payer: Self-pay | Admitting: Orthopaedic Surgery

## 2023-04-21 DIAGNOSIS — G5602 Carpal tunnel syndrome, left upper limb: Secondary | ICD-10-CM | POA: Diagnosis not present

## 2023-04-21 MED ORDER — HYDROCODONE-ACETAMINOPHEN 5-325 MG PO TABS: 1.0000 | ORAL_TABLET | Freq: Four times a day (QID) | ORAL | 0 refills | Status: DC | PRN

## 2023-04-21 NOTE — Telephone Encounter (Signed)
Pt called requesting to contact pharmacy with pre auth for her medication. Please call pharmacy on file. Pt phone number is [phone].

## 2023-04-21 NOTE — Telephone Encounter (Signed)
PA completed.

## 2023-04-27 ENCOUNTER — Encounter: Payer: Self-pay | Admitting: Nurse Practitioner

## 2023-04-27 ENCOUNTER — Ambulatory Visit: Payer: BC Managed Care – PPO | Admitting: Nurse Practitioner

## 2023-04-27 VITALS — BP 148/78 | HR 64 | Temp 97.6°F | Ht 63.5 in | Wt 207.6 lb

## 2023-04-27 DIAGNOSIS — M79672 Pain in left foot: Secondary | ICD-10-CM

## 2023-04-27 DIAGNOSIS — G4733 Obstructive sleep apnea (adult) (pediatric): Secondary | ICD-10-CM

## 2023-04-27 DIAGNOSIS — E1169 Type 2 diabetes mellitus with other specified complication: Secondary | ICD-10-CM

## 2023-04-27 DIAGNOSIS — Z9889 Other specified postprocedural states: Secondary | ICD-10-CM

## 2023-04-27 DIAGNOSIS — D649 Anemia, unspecified: Secondary | ICD-10-CM | POA: Diagnosis not present

## 2023-04-27 DIAGNOSIS — E785 Hyperlipidemia, unspecified: Secondary | ICD-10-CM | POA: Diagnosis not present

## 2023-04-27 DIAGNOSIS — G8929 Other chronic pain: Secondary | ICD-10-CM

## 2023-04-27 DIAGNOSIS — Z79899 Other long term (current) drug therapy: Secondary | ICD-10-CM

## 2023-04-27 DIAGNOSIS — E669 Obesity, unspecified: Secondary | ICD-10-CM

## 2023-04-27 DIAGNOSIS — E049 Nontoxic goiter, unspecified: Secondary | ICD-10-CM

## 2023-04-27 DIAGNOSIS — M5442 Lumbago with sciatica, left side: Secondary | ICD-10-CM

## 2023-04-27 DIAGNOSIS — I10 Essential (primary) hypertension: Secondary | ICD-10-CM | POA: Diagnosis not present

## 2023-04-27 DIAGNOSIS — I771 Stricture of artery: Secondary | ICD-10-CM | POA: Diagnosis not present

## 2023-04-27 DIAGNOSIS — E559 Vitamin D deficiency, unspecified: Secondary | ICD-10-CM

## 2023-04-27 DIAGNOSIS — Z9109 Other allergy status, other than to drugs and biological substances: Secondary | ICD-10-CM

## 2023-04-27 DIAGNOSIS — M5441 Lumbago with sciatica, right side: Secondary | ICD-10-CM

## 2023-04-27 LAB — CBC WITH DIFFERENTIAL/PLATELET
Absolute Monocytes: 413 cells/uL (ref 200–950)
Hemoglobin: 12.1 g/dL (ref 11.7–15.5)
MCH: 26 pg — ABNORMAL LOW (ref 27.0–33.0)
MCHC: 32 g/dL (ref 32.0–36.0)
MPV: 11 fL (ref 7.5–12.5)
Neutrophils Relative %: 44.2 %
Platelets: 417 10*3/uL — ABNORMAL HIGH (ref 140–400)
RBC: 4.66 10*6/uL (ref 3.80–5.10)
RDW: 14.2 % (ref 11.0–15.0)

## 2023-04-27 NOTE — Progress Notes (Signed)
Follow Up  Assessment and Plan:  Essential hypertension Controlled Discussed DASH (Dietary Approaches to Stop Hypertension) DASH diet is lower in sodium than a typical American diet. Cut back on foods that are high in saturated fat, cholesterol, and trans fats. Eat more whole-grain foods, fish, poultry, and nuts Remain active and exercise as tolerated daily.  Monitor BP at home-Call if greater than 130/80.  Check CMP/CBC  Tortuous aorta (HCC) Control blood pressure, cholesterol, glucose, increase exercise.   Diabetes mellitus without complication (HCC) Ozempic to 0.5 mg weekly. Education: Reviewed 'ABCs' of diabetes management  Discussed goals to be met and/or maintained include A1C (<7) Blood pressure (<130/80) Cholesterol (LDL <70) Continue Eye Exam yearly  Continue Dental Exam Q6 mo Discussed dietary recommendations Discussed Physical Activity recommendations Foot exam - Low Risk Check A1C  OSA (obstructive sleep apnea)/ Obesity hypoventilation syndrome (HCC) Continue CPAP Continue weight loss   Mixed hyperlipidemia associated with T2DM (HCC) Discussed lifestyle modifications. Recommended diet heavy in fruits and veggies, omega 3's. Decrease consumption of animal meats, cheeses, and dairy products. Remain active and exercise as tolerated. Continue to monitor. Check lipids/TSH  Obesity - BMI 33 Discussed appropriate BMI Diet modification. Physical activity. Encouraged/praised to build confidence.  Vitamin D deficiency Continue supplementation Check vitamin D level  Goiter Biopsy negative, stable on f/u US 12/2018,  No changes noted on today's exam Monitor TSH levels  Environmental allergies Continue antihastime Avoid triggers  Chronic right-sided and left-sided low back pain without sciatica/Left foot pain Established with Dr. Magnus Ivan, gabapentin PRN but hasn't needed Improved with walking and weight loss RICE Method when flared to left foot RTC for  further evaluation and possible imaging if no improvement.  Medication management All medications discussed and reviewed in full. All questions and concerns regarding medications addressed.    S/p left hand carpel tunnel surgery Well managed Continue to follow with Orthopedics.    Orders Placed This Encounter  Procedures   CBC with Differential/Platelet   COMPLETE METABOLIC PANEL WITH GFR   Lipid panel   Hemoglobin A1c   Iron, TIBC and Ferritin Panel   Notify office for further evaluation and treatment, questions or concerns if any reported s/s fail to improve.   The patient was advised to call back or seek an in-person evaluation if any symptoms worsen or if the condition fails to improve as anticipated.   Further disposition pending results of labs. Discussed med's effects and SE's.    I discussed the assessment and treatment plan with the patient. The patient was provided an opportunity to ask questions and all were answered. The patient agreed with the plan and demonstrated an understanding of the instructions.  Discussed med's effects and SE's. Screening labs and tests as requested with regular follow-up as recommended.  I provided 25 minutes of face-to-face time during this encounter including counseling, chart review, and critical decision making was preformed.   Future Appointments  Date Time Provider Department Center  05/04/2023  8:30 AM Kirtland Bouchard, PA-C [facility] None  09/12/2023 10:00 AM Adela Glimpse, NP [facility] None    HPI  62 y.o. female, presents for a general follow up. She has Hypertension; Hyperlipidemia associated with type 2 diabetes mellitus (HCC); Type 2 diabetes mellitus with obesity (HCC); Environmental allergies; Obesity hypoventilation syndrome (HCC) ; Goiter; Vitamin D deficiency; Obesity (BMI 30.0-34.9); Tortuous aorta (HCC); Carpal tunnel syndrome, left upper limb; S/P small bowel resection; B12 deficiency; and OSA (obstructive sleep apnea) on  their problem list.   Overall she  reports feeling well.  She is s/p 1 week from left wrist carpel tunnel surgery with Dr. Magnus Ivan.  Overall she reports feeling well.  She continues to work as a Producer, television/film/video at [employer], married, no kids. Working from home. She follows with [facility].   This year she had SBO and had resection in 07/2020, has done well since.  She has hx of right hip/back pain, and bilateral knee pain, seeing Dr. Magnus Ivan, but reports symptoms improved with daily walking. Takes gabapentin PRN pain but hasn't needed.  Has noticed some increase in left foot pain, and swelling.  Feels associated with overuse, walking daily.    She has OSA, on CPAP, reports compliance - continuing to lose weight.  BMI is Body mass index is 36.2 kg/m., she has been working on diet and exercise. Walking 3 miles daily and continues walking with a group of ladies in her neighborhood.   Wt Readings from Last 3 Encounters:  04/27/23 207 lb 9.6 oz (94.2 kg)  03/02/23 212 lb 12.8 oz (96.5 kg)  12/23/22 218 lb (98.9 kg)   She does check blood pressures at home, recently ranging 130-140s/80s today their BP is BP: (!) 148/78 She denies CP, heart palpitations, dizziness, SOB.  BP Readings from Last 3 Encounters:  04/27/23 (!) 148/78  03/02/23 (!) 150/70  12/23/22 136/68   She does workout. She denies chest pain, shortness of breath, dizziness. 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 Ophelia Charter in [place] retinal specialist, had normal CT chest 2012, normal CXR 2017 other than tortuous aorta.   She is on cholesterol medication (rosuvastatin 5 mg once weekly) and denies myalgias. Her cholesterol is not at goal of LDL <70. The cholesterol last visit was:   Lab Results  Component Value Date   CHOL 173 12/23/2022   HDL 105 12/23/2022   LDLCALC 54 12/23/2022   TRIG 56  12/23/2022   CHOLHDL 1.6 12/23/2022   She has been working on diet and exercise for Diabetes without complications, currently treated by metformin 2000 mg daily.  During last OV she was started on Ozempic. she is on bASA, she is on ACE/ARB, and denies nausea, paresthesia of the feet, polydipsia and polyuria. She does check fasting glucose, running 80s-115.  Last A1C was:  Lab Results  Component Value Date   HGBA1C 6.3 (H) 12/23/2022   Last GFR: Lab Results  Component Value Date   GFRAA 120 01/14/2021   She has hx of goiter with nodule, had negative biopsy in 2012, f/u thyroid US 12/2018 showed nodule was stable/minimal changes.  Lab Results  Component Value Date   TSH 0.54 12/23/2022   Patient is on Vitamin D supplement.   Lab Results  Component Value Date   VD25OH 58 12/23/2022      Current Medications:  Current Outpatient Medications on File Prior to Visit  Medication Sig Dispense Refill   HYDROcodone-acetaminophen (NORCO/VICODIN) 5-325 MG tablet Take 1-2 tablets by mouth every 6 (six) hours as needed for moderate pain. 30 tablet 0   amLODipine (NORVASC) 2.5 MG tablet TAKE 1 TAB EVERY NIGHT FOR BP 90 tablet 3   aspirin EC 81 MG tablet Take 81 mg by mouth daily.     bisoprolol-hydrochlorothiazide (ZIAC) 10-6.25 MG tablet TAKE 1 TABLET BY MOUTH EVERY DAY FOR BLOOD PRESSURE 90 tablet 3   Cyanocobalamin (VITAMIN B-12 SL) Place  1 tablet under the tongue daily.      ferrous sulfate 325 (65 FE) MG tablet TAKE 1 TABLET BY MOUTH EVERY DAY 90 tablet 0   fluticasone (FLONASE) 50 MCG/ACT nasal spray Place 2 sprays into both nostrils daily. (Patient taking differently: Place 2 sprays into both nostrils daily as needed for allergies.) 16 g 6   furosemide (LASIX) 40 MG tablet TAKE 1 TABLET BY MOUTH TWICE A DAY FOR BP & FLUID RETENTION / ANKLE SWELLING 180 tablet 3   gabapentin (NEURONTIN) 100 MG capsule TAKE 1 CAPSULE 3 TIMES A DAY FOR CHRONIC PAIN 270 capsule 2   hydrALAZINE (APRESOLINE) 25  MG tablet Take  1 tablet  3 x /day  for BP & Heart 270 tablet 3   Magnesium 250 MG TABS Take 250 mg by mouth daily.     metFORMIN (GLUCOPHAGE-XR) 500 MG 24 hr tablet TAKE 2 TABLETS BY MOUTH TWICE A DAY WITH A MEAL FOR DIABETES 360 tablet 3   Multiple Vitamins-Minerals (MULTIVITAMIN PO) Take by mouth daily.     olmesartan (BENICAR) 40 MG tablet TAKE 1 TABLET DAILY FOR BLOOD PRESSURE & DIABETIC KIDNEY PROTECTION 90 tablet 1   predniSONE (DELTASONE) 10 MG tablet 1 tab 3 x day for 2 days, then 1 tab 2 x day for 2 days, then 1 tab 1 x day for 3 days.  Monitor Blood Sugar. 13 tablet 0   RESTASIS 0.05 % ophthalmic emulsion Place 1 drop into both eyes 3 (three) times daily as needed for dry eyes.     rosuvastatin (CRESTOR) 5 MG tablet TAKE 1 TABLET BY MOUTH ONE TIME PER WEEK 13 tablet 3   Semaglutide, 1 MG/DOSE, (OZEMPIC, 1 MG/DOSE,) 4 MG/3ML SOPN INJECT 1MG  INTO THE SKIN ONCE A WEEK (Patient taking differently: Takes .50 mg weekly) 2 mL 1   No current facility-administered medications on file prior to visit.   Allergies:  Allergies  Allergen Reactions   Amlodipine Swelling   Naproxen Hives   Meloxicam & Diet Manage Prod Rash   Medical History:  She has Hypertension; Hyperlipidemia associated with type 2 diabetes mellitus (HCC); Type 2 diabetes mellitus with obesity (HCC); Environmental allergies; Obesity hypoventilation syndrome (HCC) ; Goiter; Vitamin D deficiency; Obesity (BMI 30.0-34.9); Tortuous aorta (HCC); Carpal tunnel syndrome, left upper limb; S/P small bowel resection; B12 deficiency; and OSA (obstructive sleep apnea) on their problem list.  Health Maintenance:   Immunization History  Administered Date(s) Administered   COVID-19, mRNA, vaccine(Comirnaty)12 years and older 11/24/2022   Influenza Inj Mdck Quad With Preservative 07/17/2018, 08/15/2019, 08/21/2020   Influenza Split 07/18/2013, 10/01/2014   Influenza,inj,Quad PF,6+ Mos 09/10/2021, 09/10/2022   Influenza,inj,quad, With  Preservative 09/02/2016   PFIZER(Purple Top)SARS-COV-2 Vaccination 01/05/2020, 02/12/2020   Pneumococcal Polysaccharide-23 06/16/1999   Tdap 03/10/2009, 07/18/2019     Patient Care Team: Lucky Cowboy, MD as PCP - General (Internal Medicine) Kathryne Hitch, MD as Consulting Physician (Orthopedic Surgery)  Surgical History:  She has a past surgical history that includes Eye surgery; achiles tendon; Abdominal hysterectomy; Tonsillectomy; and laparotomy (N/A, 07/27/2020). Family History:  Herfamily history includes Diabetes in her father and paternal grandmother; Heart disease in her father, paternal grandmother, and sister; Hypertension in her brother, mother, and sister. Social History:  She reports that she has never smoked. She has never used smokeless tobacco. She reports current alcohol use. She reports that she does not use drugs.  Review of Systems: Review of Systems  Constitutional:  Negative for chills, fever and malaise/fatigue.  HENT:  Negative for congestion, ear pain and sore throat.   Eyes: Negative.   Respiratory:  Negative for cough, shortness of breath and wheezing.   Cardiovascular:  Negative for chest pain, palpitations and leg swelling.  Gastrointestinal:  Negative for abdominal pain, blood in stool, constipation, diarrhea, heartburn and melena.  Genitourinary: Negative.   Musculoskeletal:  Negative for back pain, falls, joint pain, myalgias and neck pain.  Skin: Negative.   Neurological:  Negative for dizziness, sensory change, loss of consciousness and headaches.  Psychiatric/Behavioral:  Negative for depression. The patient is not nervous/anxious and does not have insomnia.     Physical Exam: Estimated body mass index is 36.2 kg/m as calculated from the following:   Height as of this encounter: 5' 3.5" (1.613 m).   Weight as of this encounter: 207 lb 9.6 oz (94.2 kg). BP (!) 148/78   Pulse 64   Temp 97.6 F (36.4 C)   Ht 5' 3.5" (1.613 m)    Wt 207 lb 9.6 oz (94.2 kg)   SpO2 99%   BMI 36.20 kg/m  General Appearance: Well nourished, in no apparent distress.  Eyes: PERRLA, EOMs, conjunctiva no swelling or erythema Sinuses: No Frontal/maxillary tenderness  ENT/Mouth: Ext aud canals clear, normal light reflex with TMs without erythema, bulging. Good dentition. No erythema, swelling, or exudate on post pharynx. Tonsils not swollen or erythematous. Hearing normal.  Neck: Supple, thyroid mildly enlarged on right. No bruits  Respiratory: Respiratory effort normal, BS equal bilaterally without rales, rhonchi, wheezing or stridor.  Cardio: RRR, no murmur, without rubs or gallops. Brisk peripheral pulses without ankle edema, mild foot mild non-pitting edema Chest: symmetric, with normal excursions and percussion.  Breasts: Getting annual mammogram, no concerns, regular self checks, declines manual today  Abdomen: Soft, nontender, no guarding, rebound, hernias, masses, or organomegaly.  Lymphatics: Non tender without lymphadenopathy.  Genitourinary: Defer to GYN Musculoskeletal: Left wrist in soft cast and ace bandage. Full ROM all peripheral extremities,5/5 strength, and normal gait.  Skin: Warm, dry without rashes, lesions, ecchymosis.  Neuro: Cranial nerves intact, reflexes equal bilaterally. Normal muscle tone, no cerebellar symptoms. Sensation intact. Sensation intact to monofilament bilateral feet.  Psych: Awake and oriented X 3, normal affect, Insight and Judgment appropriate.    Adela Glimpse, NP 12:19 PM [facility]

## 2023-04-27 NOTE — Patient Instructions (Signed)

## 2023-04-28 LAB — HEMOGLOBIN A1C
Hgb A1c MFr Bld: 6.1 % of total Hgb — ABNORMAL HIGH (ref <5.7)
Mean Plasma Glucose: 128 mg/dL
eAG (mmol/L): 7.1 mmol/L

## 2023-04-28 LAB — CBC WITH DIFFERENTIAL/PLATELET
Basophils Absolute: 39 cells/uL (ref 0–200)
Basophils Relative: 0.7 %
Eosinophils Absolute: 88 cells/uL (ref 15–500)
Eosinophils Relative: 1.6 %
HCT: 37.8 % (ref 35.0–45.0)
Lymphs Abs: 2530 cells/uL (ref 850–3900)
MCV: 81.1 fL (ref 80.0–100.0)
Monocytes Relative: 7.5 %
Neutro Abs: 2431 cells/uL (ref 1500–7800)
Total Lymphocyte: 46 %
WBC: 5.5 10*3/uL (ref 3.8–10.8)

## 2023-04-28 LAB — IRON,TIBC AND FERRITIN PANEL
%SAT: 25 % (calc) (ref 16–45)
Ferritin: 13 ng/mL — ABNORMAL LOW (ref 16–288)
Iron: 95 ug/dL (ref 45–160)
TIBC: 375 mcg/dL (calc) (ref 250–450)

## 2023-04-28 LAB — LIPID PANEL
Cholesterol: 178 mg/dL (ref <200)
HDL: 110 mg/dL (ref >=50)
LDL Cholesterol (Calc): 54 mg/dL (calc)
Non-HDL Cholesterol (Calc): 68 mg/dL (calc) (ref <130)
Total CHOL/HDL Ratio: 1.6 (calc) (ref <5.0)
Triglycerides: 68 mg/dL (ref <150)

## 2023-04-28 LAB — COMPLETE METABOLIC PANEL WITH GFR
AG Ratio: 1.7 (calc) (ref 1.0–2.5)
ALT: 12 U/L (ref 6–29)
AST: 14 U/L (ref 10–35)
Albumin: 4.2 g/dL (ref 3.6–5.1)
Alkaline phosphatase (APISO): 57 U/L (ref 37–153)
BUN: 16 mg/dL (ref 7–25)
CO2: 31 mmol/L (ref 20–32)
Calcium: 9.2 mg/dL (ref 8.6–10.4)
Chloride: 103 mmol/L (ref 98–110)
Creat: 0.52 mg/dL (ref 0.50–1.05)
Globulin: 2.5 g/dL (calc) (ref 1.9–3.7)
Glucose, Bld: 95 mg/dL (ref 65–99)
Potassium: 4.9 mmol/L (ref 3.5–5.3)
Sodium: 142 mmol/L (ref 135–146)
Total Bilirubin: 0.5 mg/dL (ref 0.2–1.2)
Total Protein: 6.7 g/dL (ref 6.1–8.1)
eGFR: 104 mL/min/{1.73_m2} (ref >=60)

## 2023-05-04 ENCOUNTER — Ambulatory Visit (INDEPENDENT_AMBULATORY_CARE_PROVIDER_SITE_OTHER): Payer: BC Managed Care – PPO | Admitting: Physician Assistant

## 2023-05-04 ENCOUNTER — Encounter: Payer: Self-pay | Admitting: Physician Assistant

## 2023-05-04 DIAGNOSIS — G5601 Carpal tunnel syndrome, right upper limb: Secondary | ICD-10-CM

## 2023-05-04 NOTE — Progress Notes (Signed)
HPI: [name] comes in today 2 weeks status post left carpal tunnel release.  She states she is getting feeling back in her fingers in the numbness tingling is no longer waking her.  She denies any pain in the hand whatsoever.  Is some minor discomfort.  Physical exam: General well-developed well-nourished female no acute distress. Left hand she has full motion is able to make fist.  She is able to do thumbs up.  Surgical incisions healing well no signs of infection or wound dehiscence.  Sensation subjectively intact throughout the median nerve distribution to light touch.  Impression: Status post left carpal tunnel release 04/21/2023  Plan: Sutures were harvested Steri-Strips applied.  She is to work on scar tissue mobilization.  No submerging of the hand in water until the wound is completely healed.  No heavy lifting or excessive gripping with the left hand.  She will follow-up with [place] in 1 month sooner if there is any questions concerns.

## 2023-06-06 ENCOUNTER — Encounter: Payer: Self-pay | Admitting: Physician Assistant

## 2023-06-06 ENCOUNTER — Ambulatory Visit (INDEPENDENT_AMBULATORY_CARE_PROVIDER_SITE_OTHER): Payer: BC Managed Care – PPO | Admitting: Physician Assistant

## 2023-06-06 DIAGNOSIS — G5602 Carpal tunnel syndrome, left upper limb: Secondary | ICD-10-CM

## 2023-06-06 NOTE — Progress Notes (Addendum)
HPI: Tanya [name] comes in today status post left carpal tunnel release 04/21/2023.  She states that she only tenderness about the incision.  Otherwise her carpal tunnel symptoms are resolved.  States currently she is having no symptoms in her right hand from carpal tunnel syndrome.  Physical exam: Left hand surgical incisions healing well slight keloid.  No signs of infection wound dehiscence.  Full motor full sensation throughout the left hand.  Impression: Status post left carpal tunnel release  Plan: She will continue to work on scar tissue mobilization.  Follow-up with [place] as needed pain persist or becomes worse.  Questions encouraged and answered at length

## 2023-07-02 ENCOUNTER — Other Ambulatory Visit: Payer: Self-pay | Admitting: Nurse Practitioner

## 2023-07-02 ENCOUNTER — Other Ambulatory Visit: Payer: Self-pay | Admitting: Internal Medicine

## 2023-09-12 ENCOUNTER — Ambulatory Visit (INDEPENDENT_AMBULATORY_CARE_PROVIDER_SITE_OTHER): Payer: BC Managed Care – PPO | Admitting: Nurse Practitioner

## 2023-09-12 ENCOUNTER — Encounter: Payer: Self-pay | Admitting: Nurse Practitioner

## 2023-09-12 VITALS — BP 138/70 | HR 68 | Temp 97.5°F | Ht 63.5 in | Wt 206.0 lb

## 2023-09-12 DIAGNOSIS — E1169 Type 2 diabetes mellitus with other specified complication: Secondary | ICD-10-CM

## 2023-09-12 DIAGNOSIS — Z131 Encounter for screening for diabetes mellitus: Secondary | ICD-10-CM

## 2023-09-12 DIAGNOSIS — Z13220 Encounter for screening for lipoid disorders: Secondary | ICD-10-CM

## 2023-09-12 DIAGNOSIS — Z1389 Encounter for screening for other disorder: Secondary | ICD-10-CM | POA: Diagnosis not present

## 2023-09-12 DIAGNOSIS — Z79899 Other long term (current) drug therapy: Secondary | ICD-10-CM

## 2023-09-12 DIAGNOSIS — Z1329 Encounter for screening for other suspected endocrine disorder: Secondary | ICD-10-CM

## 2023-09-12 DIAGNOSIS — Z0001 Encounter for general adult medical examination with abnormal findings: Secondary | ICD-10-CM

## 2023-09-12 DIAGNOSIS — Z0000 Encounter for general adult medical examination without abnormal findings: Secondary | ICD-10-CM

## 2023-09-12 DIAGNOSIS — Z9109 Other allergy status, other than to drugs and biological substances: Secondary | ICD-10-CM

## 2023-09-12 DIAGNOSIS — Z130 Encounter for screening for diseases of the blood and blood-forming organs and certain disorders involving the immune mechanism: Secondary | ICD-10-CM | POA: Diagnosis not present

## 2023-09-12 DIAGNOSIS — G4733 Obstructive sleep apnea (adult) (pediatric): Secondary | ICD-10-CM

## 2023-09-12 DIAGNOSIS — Z136 Encounter for screening for cardiovascular disorders: Secondary | ICD-10-CM | POA: Diagnosis not present

## 2023-09-12 DIAGNOSIS — Z1211 Encounter for screening for malignant neoplasm of colon: Secondary | ICD-10-CM

## 2023-09-12 DIAGNOSIS — G8929 Other chronic pain: Secondary | ICD-10-CM

## 2023-09-12 DIAGNOSIS — B372 Candidiasis of skin and nail: Secondary | ICD-10-CM

## 2023-09-12 DIAGNOSIS — E538 Deficiency of other specified B group vitamins: Secondary | ICD-10-CM

## 2023-09-12 DIAGNOSIS — Z23 Encounter for immunization: Secondary | ICD-10-CM

## 2023-09-12 DIAGNOSIS — E049 Nontoxic goiter, unspecified: Secondary | ICD-10-CM

## 2023-09-12 DIAGNOSIS — I10 Essential (primary) hypertension: Secondary | ICD-10-CM | POA: Diagnosis not present

## 2023-09-12 DIAGNOSIS — E559 Vitamin D deficiency, unspecified: Secondary | ICD-10-CM | POA: Diagnosis not present

## 2023-09-12 DIAGNOSIS — E66811 Obesity, class 1: Secondary | ICD-10-CM

## 2023-09-12 DIAGNOSIS — D500 Iron deficiency anemia secondary to blood loss (chronic): Secondary | ICD-10-CM

## 2023-09-12 DIAGNOSIS — I771 Stricture of artery: Secondary | ICD-10-CM

## 2023-09-12 MED ORDER — NYSTATIN 100000 UNIT/GM EX CREA: 1.0000 | TOPICAL_CREAM | Freq: Two times a day (BID) | CUTANEOUS | 0 refills | Status: DC

## 2023-09-12 NOTE — Patient Instructions (Signed)
Inter Dry Pads - [employer]  Skin Yeast Infection  A skin yeast infection is a condition in which there is an overgrowth of yeast (Candida) that normally lives on the skin. This condition usually occurs in areas of the skin that are constantly warm and moist, such as the skin under the breasts or armpits, or in the groin and other body folds. What are the causes? This condition is caused by a change in the normal balance of the yeast that live on the skin. What increases the risk? You are more likely to develop this condition if you: Are obese. Are pregnant. Are 10 years of age or older. Wear tight clothing. Have any of the following conditions: Diabetes. Malnutrition. A weak body defense system (immune system). Take medicines such as: Birth control pills. Antibiotics. Steroid medicines. What are the signs or symptoms? The most common symptom of this condition is itchiness in the affected area. Other symptoms include: A red, swollen area of the skin. Bumps on the skin. How is this diagnosed? This condition is diagnosed with a medical history and physical exam. Your health care provider may check for yeast by taking scrapings of the skin to be viewed under a microscope. How is this treated? This condition is treated with medicine. Medicines may be prescribed or available over the counter. The medicines may be: Taken by mouth (orally). Applied as a cream or powder to your skin. Follow these instructions at home:  Take or apply over-the-counter and prescription medicines only as told by your health care provider. Maintain a healthy weight. If you need help losing weight, talk with your health care provider. Keep your skin clean and dry. Wear loose-fitting clothing. If you have diabetes, keep your blood sugar under control. Keep all follow-up visits. This is important. Contact a health care provider if: Your symptoms go away and then come back. Your symptoms do not get better with  treatment. Your symptoms get worse. Your rash spreads. You have a fever or chills. You have new symptoms. You have new warmth or redness of your skin. Your rash is painful or bleeding. Summary A skin yeast infection is a condition in which there is an overgrowth of yeast (Candida) that normally lives on the skin. Take or apply over-the-counter and prescription medicines only as told by your health care provider. Keep your skin clean and dry. Contact a health care provider if your symptoms do not get better with treatment. This information is not intended to replace advice given to you by your health care provider. Make sure you discuss any questions you have with your health care provider. Document Revised: 01/06/2021 Document Reviewed: 01/06/2021 Elsevier Patient Education  2024 [employer].

## 2023-09-12 NOTE — Progress Notes (Signed)
Complete Physical  Assessment and Plan:  Encounter for Annual Physical Exam with abnormal findings Due annually  Health Maintenance reviewed Healthy lifestyle reviewed and goals set  Tortuous aorta (HCC) Control blood pressure, cholesterol, glucose, increase exercise.   Essential hypertension Discussed changes to medications if continues to remain HTN in the next 1-3 months. Follow up at next OV/3 mo after closely  monitoring BP - suggested to bring BP log to NOV. Discussed DASH (Dietary Approaches to Stop Hypertension) DASH diet is lower in sodium than a typical American diet. Cut back on foods that are high in saturated fat, cholesterol, and trans fats. Eat more whole-grain foods, fish, poultry, and nuts Remain active and exercise as tolerated daily.  Monitor BP at home-Call if greater than 130/80.  Check CMP/CBC  Diabetes mellitus without complication (HCC) Continue Ozempic  Education: Reviewed 'ABCs' of diabetes management  Discussed goals to be met and/or maintained include A1C (<7) Blood pressure (<130/80) Cholesterol (LDL <70) Continue Eye Exam yearly  Continue Dental Exam Q6 mo Discussed dietary recommendations Discussed Physical Activity recommendations Foot exam - Low Risk Check A1C  OSA (obstructive sleep apnea)/ Obesity hypoventilation syndrome (HCC) Continue CPAP - she will have retested for further compliance.   Weight loss advised  Mixed hyperlipidemia associated with T2DM (HCC) Discussed lifestyle modifications. Recommended diet heavy in fruits and veggies, omega 3's. Decrease consumption of animal meats, cheeses, and dairy products. Remain active and exercise as tolerated. Continue to monitor. Check lipids/TSH  Obesity - BMI 33 Discussed appropriate BMI Diet modification. Physical activity. Encouraged/praised to build confidence.  Vitamin D deficiency Continue supplementation Check vitamin D level  Goiter Biopsy negative, stable on f/u US  12/2018,  No changes noted on today's exam Monitor TSH levels  Environmental allergies Continue antihastime Avoid triggers  Chronic right-sided low back pain without sciatica/Left foot pain Established with Dr. Magnus Ivan, gabapentin PRN but hasn't needed Improved with walking and weight loss RICE Method when flared to left foot RTC for further evaluation and possible imaging if no improvement.  Medication management All medications discussed and reviewed in full. All questions and concerns regarding medications addressed.    B12 deficiency  Continue supplement Monitor levels  Need for influenza vaccine Flu vaccine administered without complication today - patient tolerated well   Screening for cardiovascular disorder Check and monitor EKG  Screening for hematuria or proteinuria Check and monitor UA/Microalbumin/CMP  Screening for thyroid disorder Check and monitor TSH  Screening for colon cancer Completed colonoscopy 2014 Dr. Margretta Sidle - recall 10 years  Average risk - Cologuard ordered  Screening for IDA Monitor CBC, iron, ferritin, TIBC  Skin Candidiasis Start tmt with Nystatin cream Review Inter Dry pads for further absorption PRN Continue to keep breast area free from moisture.  Monitor for increase in redness or no improvement Contact office if s/s fail to resolve.   Orders Placed This Encounter  Procedures   Fluzone Trivalent Flu Vaccine ([place] dose preparattion)   Cologuard   CBC with Differential/Platelet   COMPLETE METABOLIC PANEL WITH GFR   Magnesium   Lipid panel   TSH   Hemoglobin A1c   Insulin, random   VITAMIN D 25 Hydroxy (Vit-D Deficiency, Fractures)   Urinalysis, Routine w reflex microscopic   Microalbumin / creatinine urine ratio   Vitamin B12   Iron, TIBC and Ferritin Panel   EKG 12-Lead   Notify office for further evaluation and treatment, questions or concerns if any reported s/s fail to improve.   The patient was  advised to call back  or seek an in-person evaluation if any symptoms worsen or if the condition fails to improve as anticipated.   Further disposition pending results of labs. Discussed med's effects and SE's.    I discussed the assessment and treatment plan with the patient. The patient was provided an opportunity to ask questions and all were answered. The patient agreed with the plan and demonstrated an understanding of the instructions.  Discussed med's effects and SE's. Screening labs and tests as requested with regular follow-up as recommended.  I provided 35 minutes of face-to-face time during this encounter including counseling, chart review, and critical decision making was preformed.   Future Appointments  Date Time Provider Department Center  09/12/2024 10:00 AM Adela Glimpse, NP [facility] None    HPI  62 y.o. female, presents for a complete physical and follow up for has Hypertension; Hyperlipidemia associated with type 2 diabetes mellitus (HCC); Type 2 diabetes mellitus with obesity (HCC); Environmental allergies; Obesity hypoventilation syndrome (HCC) ; Goiter; Vitamin D deficiency; Obesity (BMI 30.0-34.9); Tortuous aorta (HCC); Carpal tunnel syndrome, left upper limb; S/P small bowel resection; B12 deficiency; and OSA (obstructive sleep apnea) on their problem list.   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 She follows with [facility].   Overall she reports feeling well.    She has increase in bilateral breast rash that is red, itchy and moist.  She has applied neosporin and OTC hydrocortisone without relief.  She walks in the mornings and works out in the evenings, reports sweating under the breast.    Hx of SBO and had resection in 07/2020, has done well since.  She has hx of right hip/back pain, and bilateral knee pain, seeing Dr. Magnus Ivan, but reports symptoms improved with daily walking. Takes gabapentin PRN pain but hasn't needed.   Has noticed some increase in left foot pain, and swelling.  Feels associated with overuse, walking daily.    She has OSA, on CPAP, does well with this, but admits only wearing 3 days a week. Feels as though she no longer needs d/t weight loss.   BMI is Body mass index is 35.92 kg/m., she has been working on diet and exercise. Walking 3 miles daily, walking with a group of ladies in her neighborhood.  Continues 0.5 mg Ozempic without complication. Wt Readings from Last 3 Encounters:  09/12/23 206 lb (93.4 kg)  04/27/23 207 lb 9.6 oz (94.2 kg)  03/02/23 212 lb 12.8 oz (96.5 kg)   She does check blood pressures at home, recently ranging 130-140s/80s since recent admission for SBO, today their BP is BP: 138/70  Currently taking ziac 10/6.25, lasix 40 mg BID, olmesartan 40 mg daily, clonidine 0.2 mg BID, hydralazine 25 mg TID.  She does workout. She denies chest pain, shortness of breath, dizziness. 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 Ophelia Charter in [place] retinal specialist, had normal CT chest 2012, normal CXR 2017 other than tortuous aorta.   She is on cholesterol medication (rosuvastatin 5 mg once weekly) and denies myalgias. Her cholesterol is not at goal of LDL <70. The cholesterol last visit was:   Lab Results  Component Value Date   CHOL 178 04/27/2023   HDL 110 04/27/2023   LDLCALC 54 04/27/2023   TRIG 68 04/27/2023   CHOLHDL 1.6 04/27/2023   She has been  working on diet and exercise for Diabetes without complications, currently treated by metformin 2000 mg daily, she is on bASA, she is on ACE/ARB, and denies nausea, paresthesia of the feet, polydipsia and polyuria. She does check fasting glucose, running 80s-115.  Last A1C was:  Lab Results  Component Value Date   HGBA1C 6.1 (H) 04/27/2023    Do you have any pain in your calf muscles when walking?  No Do you have any pain in  your foot or feet?  Yes  If yes, left foot painful Have you had any problems with your feet since the last visit?  No. Do you have any sores on your feet?  No.  Have you noticed any blood or discharge on your socks or hose?  No Does patient wear shoes that are closed toe, low heel, adequate toe room (no pointed toe), and breathable material like leather, canvas or suede?  Yes  Last GFR: Lab Results  Component Value Date   GFRAA 120 01/14/2021   She has hx of goiter with nodule, had negative biopsy in 2012, f/u thyroid US 12/2018 showed nodule was stable/minimal changes.  Lab Results  Component Value Date   TSH 0.54 12/23/2022   Patient is on Vitamin D supplement.   Lab Results  Component Value Date   VD25OH 58 12/23/2022         Current Medications:  Current Outpatient Medications on File Prior to Visit  Medication Sig Dispense Refill   amLODipine (NORVASC) 2.5 MG tablet TAKE 1 TAB EVERY NIGHT FOR BP 90 tablet 3   aspirin EC 81 MG tablet Take 81 mg by mouth daily.     bisoprolol-hydrochlorothiazide (ZIAC) 10-6.25 MG tablet TAKE 1 TABLET BY MOUTH EVERY DAY FOR BLOOD PRESSURE 90 tablet 3   Cyanocobalamin (VITAMIN B-12 SL) Place 1 tablet under the tongue daily.      fluticasone (FLONASE) 50 MCG/ACT nasal spray Place 2 sprays into both nostrils daily. (Patient taking differently: Place 2 sprays into both nostrils daily as needed for allergies.) 16 g 6   furosemide (LASIX) 40 MG tablet TAKE 1 TABLET BY MOUTH TWICE A DAY FOR BP & FLUID RETENTION / ANKLE SWELLING 180 tablet 3   gabapentin (NEURONTIN) 100 MG capsule TAKE 1 CAPSULE 3 TIMES A DAY FOR CHRONIC PAIN 270 capsule 2   hydrALAZINE (APRESOLINE) 25 MG tablet TAKE 1 TABLET 3 X /DAY FOR BP & HEART 270 tablet 3   Magnesium 250 MG TABS Take 250 mg by mouth daily.     metFORMIN (GLUCOPHAGE-XR) 500 MG 24 hr tablet TAKE 2 TABLETS BY MOUTH TWICE A DAY WITH A MEAL FOR DIABETES 360 tablet 3   Multiple Vitamins-Minerals (MULTIVITAMIN PO) Take  by mouth daily.     olmesartan (BENICAR) 40 MG tablet TAKE 1 TABLET DAILY FOR BLOOD PRESSURE & DIABETIC KIDNEY PROTECTION 90 tablet 1   RESTASIS 0.05 % ophthalmic emulsion Place 1 drop into both eyes 3 (three) times daily as needed for dry eyes.     rosuvastatin (CRESTOR) 5 MG tablet TAKE 1 TABLET BY MOUTH ONE TIME PER WEEK 13 tablet 3   Semaglutide, 1 MG/DOSE, (OZEMPIC, 1 MG/DOSE,) 4 MG/3ML SOPN INJECT 1MG  INTO THE SKIN ONCE A WEEK (Patient taking differently: Takes .50 mg weekly) 2 mL 1   ferrous sulfate 325 (65 FE) MG tablet TAKE 1 TABLET BY MOUTH EVERY DAY (Patient not taking: Reported on 09/12/2023) 90 tablet 0   No current facility-administered medications on file prior to visit.  Allergies:  Allergies  Allergen Reactions   Amlodipine Swelling   Naproxen Hives   Meloxicam & Diet Manage Prod Rash   Medical History:  She has Hypertension; Hyperlipidemia associated with type 2 diabetes mellitus (HCC); Type 2 diabetes mellitus with obesity (HCC); Environmental allergies; Obesity hypoventilation syndrome (HCC) ; Goiter; Vitamin D deficiency; Obesity (BMI 30.0-34.9); Tortuous aorta (HCC); Carpal tunnel syndrome, left upper limb; S/P small bowel resection; B12 deficiency; and OSA (obstructive sleep apnea) on their problem list.  Health Maintenance:   Immunization History  Administered Date(s) Administered   Fluzone Influenza virus vaccine,trivalent (IIV3), split virus 09/12/2023   Influenza Inj Mdck Quad With Preservative 07/17/2018, 08/15/2019, 08/21/2020   Influenza Split 07/18/2013, 10/01/2014   Influenza,inj,Quad PF,6+ Mos 09/10/2021, 09/10/2022   Influenza,inj,quad, With Preservative 09/02/2016   PFIZER Comirnaty(Gray Top)Covid-19 Tri-Sucrose Vaccine 07/21/2023   PFIZER(Purple Top)SARS-COV-2 Vaccination 01/05/2020, 02/12/2020   Pfizer(Comirnaty)Fall Seasonal Vaccine 12 years and older 11/24/2022   Pneumococcal Polysaccharide-23 06/16/1999   Tdap 03/10/2009, 07/18/2019   Tetanus:  2020 Pneumovax: 2000, due age 62 Flu vaccine: TODAY 09/12/23 Shingles: DUE, wants to call and ask Covid 19: 2/2, 2021, pfizer, had several boosters, record requested  LMP:  Hysterectomy Pelvic/PAP: last by GYN, s/p hysterectomy, never abormal, follow up for recommendations MGM:  08/2022 Negative - Due - Have Scheduled  Colonoscopy: 2014 due 10 years - Cologuard Echo 2017  Last Eye Exam: Dr. Hyacinth Meeker, 09/10/2023 Last Dental Exam: Last last visit 2024, goes q40m Last Derm Exam:  Does not follow - denies any recent skin changes.  Patient Care Team: Lucky Cowboy, MD as PCP - General (Internal Medicine) Kathryne Hitch, MD as Consulting Physician (Orthopedic Surgery)  Surgical History:  She has a past surgical history that includes Eye surgery; achiles tendon; Abdominal hysterectomy; Tonsillectomy; and laparotomy (N/A, 07/27/2020). Family History:  Herfamily history includes Diabetes in her father and paternal grandmother; Heart disease in her father, paternal grandmother, and sister; Hypertension in her brother, mother, and sister. Social History:  She reports that she has never smoked. She has never used smokeless tobacco. She reports current alcohol use. She reports that she does not use drugs.  Review of Systems: Review of Systems  Constitutional:  Negative for chills, fever and malaise/fatigue.  HENT:  Negative for congestion, ear pain and sore throat.   Eyes: Negative.   Respiratory:  Negative for cough, shortness of breath and wheezing.   Cardiovascular:  Negative for chest pain, palpitations and leg swelling.  Gastrointestinal:  Negative for abdominal pain, blood in stool, constipation, diarrhea, heartburn and melena.  Genitourinary: Negative.   Musculoskeletal:  Negative for back pain, falls, joint pain, myalgias and neck pain.  Skin: Negative.   Neurological:  Negative for dizziness, sensory change, loss of consciousness and headaches.  Psychiatric/Behavioral:   Negative for depression. The patient is not nervous/anxious and does not have insomnia.     Physical Exam: Estimated body mass index is 35.92 kg/m as calculated from the following:   Height as of this encounter: 5' 3.5" (1.613 m).   Weight as of this encounter: 206 lb (93.4 kg). BP 138/70   Pulse 68   Temp (!) 97.5 F (36.4 C)   Ht 5' 3.5" (1.613 m)   Wt 206 lb (93.4 kg)   SpO2 97%   BMI 35.92 kg/m  General Appearance: Well nourished, in no apparent distress.  Eyes: PERRLA, EOMs, conjunctiva no swelling or erythema Sinuses: No Frontal/maxillary tenderness  ENT/Mouth: Ext aud canals clear, normal light reflex with TMs without  erythema, bulging. Good dentition. No erythema, swelling, or exudate on post pharynx. Tonsils not swollen or erythematous. Hearing normal.  Neck: Supple, thyroid mildly enlarged on right. No bruits  Respiratory: Respiratory effort normal, BS equal bilaterally without rales, rhonchi, wheezing or stridor.  Cardio: RRR, no murmur, without rubs or gallops. Brisk peripheral pulses without ankle edema, mild foot mild non-pitting edema Chest: symmetric, with normal excursions and percussion.  Breasts: Getting annual mammogram, no concerns, regular self checks, declines manual today  Abdomen: Soft, nontender, no guarding, rebound, hernias, masses, or organomegaly.  Lymphatics: Non tender without lymphadenopathy.  Genitourinary: Defer to GYN Musculoskeletal: Full ROM all peripheral extremities,5/5 strength, and normal gait.  Skin: Warm, dry without rashes, lesions, ecchymosis.  Neuro: Cranial nerves intact, reflexes equal bilaterally. Normal muscle tone, no cerebellar symptoms. Sensation intact. Sensation intact to monofilament bilateral feet.  Psych: Awake and oriented X 3, normal affect, Insight and Judgment appropriate.     PAST DIABETIC HISTORY:  History of amputations:  No  Previous foot ulcer:  No   OBJECTIVE/FOOT EXAMINATION:  Foot:    right footThe  patient denies associated drainage, fever, pain, or redness with the ulcer Left foot with tenderness to lateral metatarsals during palpation, mild edema.  Skin:    normal exam; no erythema, swelling or tendernessnormal turgor Nails:  thickening Sensory: normal to light touch and a pin prick     Right Pulses: present Left Pulses: present  Bunions:  Notes mild bunion deformity of the right, left great toe.   EKG:  NSR  Adela Glimpse, NP 11:06 AM [facility]

## 2023-09-13 LAB — COMPLETE METABOLIC PANEL WITH GFR
AG Ratio: 1.7 (calc) (ref 1.0–2.5)
ALT: 11 U/L (ref 6–29)
AST: 16 U/L (ref 10–35)
Albumin: 4.5 g/dL (ref 3.6–5.1)
Alkaline phosphatase (APISO): 54 U/L (ref 37–153)
BUN: 13 mg/dL (ref 7–25)
CO2: 33 mmol/L — ABNORMAL HIGH (ref 20–32)
Calcium: 9.4 mg/dL (ref 8.6–10.4)
Chloride: 98 mmol/L (ref 98–110)
Creat: 0.55 mg/dL (ref 0.50–1.05)
Globulin: 2.7 g/dL (ref 1.9–3.7)
Glucose, Bld: 116 mg/dL — ABNORMAL HIGH (ref 65–99)
Potassium: 3.8 mmol/L (ref 3.5–5.3)
Sodium: 140 mmol/L (ref 135–146)
Total Bilirubin: 0.4 mg/dL (ref 0.2–1.2)
Total Protein: 7.2 g/dL (ref 6.1–8.1)
eGFR: 103 mL/min/{1.73_m2} (ref >=60)

## 2023-09-13 LAB — LIPID PANEL
Cholesterol: 166 mg/dL (ref <200)
HDL: 101 mg/dL (ref >=50)
LDL Cholesterol (Calc): 52 mg/dL
Non-HDL Cholesterol (Calc): 65 mg/dL (ref <130)
Total CHOL/HDL Ratio: 1.6 (calc) (ref <5.0)
Triglycerides: 54 mg/dL (ref <150)

## 2023-09-13 LAB — MICROALBUMIN / CREATININE URINE RATIO
Creatinine, Urine: 27 mg/dL (ref 20–275)
Microalb Creat Ratio: 11 mg/g{creat} (ref <30)
Microalb, Ur: 0.3 mg/dL

## 2023-09-13 LAB — URINALYSIS, ROUTINE W REFLEX MICROSCOPIC
Bilirubin Urine: NEGATIVE
Glucose, UA: NEGATIVE
Hgb urine dipstick: NEGATIVE
Ketones, ur: NEGATIVE
Leukocytes,Ua: NEGATIVE
Nitrite: NEGATIVE
Protein, ur: NEGATIVE
Specific Gravity, Urine: 1.01 (ref 1.001–1.035)
pH: 6.5 (ref 5.0–8.0)

## 2023-09-13 LAB — CBC WITH DIFFERENTIAL/PLATELET
Absolute Lymphocytes: 2143 {cells}/uL (ref 850–3900)
Absolute Monocytes: 338 {cells}/uL (ref 200–950)
Basophils Absolute: 38 {cells}/uL (ref 0–200)
Basophils Relative: 0.8 %
Eosinophils Absolute: 52 {cells}/uL (ref 15–500)
Eosinophils Relative: 1.1 %
HCT: 39 % (ref 35.0–45.0)
Hemoglobin: 12.3 g/dL (ref 11.7–15.5)
MCH: 25.9 pg — ABNORMAL LOW (ref 27.0–33.0)
MCHC: 31.5 g/dL — ABNORMAL LOW (ref 32.0–36.0)
MCV: 82.1 fL (ref 80.0–100.0)
MPV: 10.9 fL (ref 7.5–12.5)
Monocytes Relative: 7.2 %
Neutro Abs: 2129 {cells}/uL (ref 1500–7800)
Neutrophils Relative %: 45.3 %
Platelets: 413 10*3/uL — ABNORMAL HIGH (ref 140–400)
RBC: 4.75 10*6/uL (ref 3.80–5.10)
RDW: 13.7 % (ref 11.0–15.0)
Total Lymphocyte: 45.6 %
WBC: 4.7 10*3/uL (ref 3.8–10.8)

## 2023-09-13 LAB — VITAMIN D 25 HYDROXY (VIT D DEFICIENCY, FRACTURES): Vit D, 25-Hydroxy: 48 ng/mL (ref 30–100)

## 2023-09-13 LAB — TSH: TSH: 0.47 m[IU]/L (ref 0.40–4.50)

## 2023-09-13 LAB — HEMOGLOBIN A1C
Hgb A1c MFr Bld: 6.1 %{Hb} — ABNORMAL HIGH (ref <5.7)
Mean Plasma Glucose: 128 mg/dL
eAG (mmol/L): 7.1 mmol/L

## 2023-09-13 LAB — IRON,TIBC AND FERRITIN PANEL
%SAT: 25 % (ref 16–45)
Ferritin: 17 ng/mL (ref 16–288)
Iron: 93 ug/dL (ref 45–160)
TIBC: 377 ug/dL (ref 250–450)

## 2023-09-13 LAB — VITAMIN B12: Vitamin B-12: 692 pg/mL (ref 200–1100)

## 2023-09-13 LAB — MAGNESIUM: Magnesium: 1.8 mg/dL (ref 1.5–2.5)

## 2023-09-13 LAB — INSULIN, RANDOM: Insulin: 31.4 u[IU]/mL — ABNORMAL HIGH

## 2023-10-02 ENCOUNTER — Other Ambulatory Visit: Payer: Self-pay | Admitting: Nurse Practitioner

## 2023-10-14 ENCOUNTER — Other Ambulatory Visit: Payer: Self-pay | Admitting: Internal Medicine

## 2023-10-14 DIAGNOSIS — Z1231 Encounter for screening mammogram for malignant neoplasm of breast: Secondary | ICD-10-CM

## 2023-10-20 ENCOUNTER — Ambulatory Visit
Admission: RE | Admit: 2023-10-20 | Discharge: 2023-10-20 | Disposition: A | Discharge status: Home / Self Care | Payer: BC Managed Care – PPO | Source: Ambulatory Visit | Attending: Internal Medicine | Admitting: Internal Medicine

## 2023-10-20 DIAGNOSIS — Z1231 Encounter for screening mammogram for malignant neoplasm of breast: Secondary | ICD-10-CM | POA: Diagnosis not present

## 2023-10-27 ENCOUNTER — Other Ambulatory Visit: Payer: Self-pay | Admitting: Nurse Practitioner

## 2023-12-05 ENCOUNTER — Other Ambulatory Visit: Payer: Self-pay | Admitting: Nurse Practitioner

## 2023-12-13 ENCOUNTER — Ambulatory Visit: Payer: BC Managed Care – PPO | Admitting: Nurse Practitioner

## 2023-12-30 ENCOUNTER — Other Ambulatory Visit: Payer: Self-pay

## 2023-12-30 MED ORDER — OZEMPIC (1 MG/DOSE) 4 MG/3ML ~~LOC~~ SOPN: 1.0000 mg | PEN_INJECTOR | SUBCUTANEOUS | 2 refills | Status: DC

## 2024-02-23 ENCOUNTER — Ambulatory Visit: Admitting: Family Medicine

## 2024-02-23 VITALS — BP 136/80 | HR 62 | Temp 98.2°F | Ht 64.0 in | Wt 209.0 lb

## 2024-02-23 DIAGNOSIS — E785 Hyperlipidemia, unspecified: Secondary | ICD-10-CM

## 2024-02-23 DIAGNOSIS — Z23 Encounter for immunization: Secondary | ICD-10-CM

## 2024-02-23 DIAGNOSIS — M5441 Lumbago with sciatica, right side: Secondary | ICD-10-CM

## 2024-02-23 DIAGNOSIS — I10 Essential (primary) hypertension: Secondary | ICD-10-CM | POA: Diagnosis not present

## 2024-02-23 DIAGNOSIS — E669 Obesity, unspecified: Secondary | ICD-10-CM | POA: Diagnosis not present

## 2024-02-23 DIAGNOSIS — B372 Candidiasis of skin and nail: Secondary | ICD-10-CM

## 2024-02-23 DIAGNOSIS — Z7985 Long-term (current) use of injectable non-insulin antidiabetic drugs: Secondary | ICD-10-CM

## 2024-02-23 DIAGNOSIS — E1169 Type 2 diabetes mellitus with other specified complication: Secondary | ICD-10-CM

## 2024-02-23 DIAGNOSIS — G8929 Other chronic pain: Secondary | ICD-10-CM

## 2024-02-23 DIAGNOSIS — Z6835 Body mass index (BMI) 35.0-35.9, adult: Secondary | ICD-10-CM | POA: Diagnosis not present

## 2024-02-23 DIAGNOSIS — Z1211 Encounter for screening for malignant neoplasm of colon: Secondary | ICD-10-CM

## 2024-02-23 DIAGNOSIS — Z7689 Persons encountering health services in other specified circumstances: Secondary | ICD-10-CM

## 2024-02-23 LAB — CBC WITH DIFFERENTIAL/PLATELET
Basophils Absolute: 0 10*3/uL (ref 0.0–0.1)
Basophils Relative: 0.7 % (ref 0.0–3.0)
Eosinophils Absolute: 0.1 10*3/uL (ref 0.0–0.7)
Eosinophils Relative: 1.1 % (ref 0.0–5.0)
HCT: 38.1 % (ref 36.0–46.0)
Hemoglobin: 12.3 g/dL (ref 12.0–15.0)
Lymphocytes Relative: 55.7 % — ABNORMAL HIGH (ref 12.0–46.0)
Lymphs Abs: 2.7 10*3/uL (ref 0.7–4.0)
MCHC: 32.4 g/dL (ref 30.0–36.0)
MCV: 81.4 fl (ref 78.0–100.0)
Monocytes Absolute: 0.4 10*3/uL (ref 0.1–1.0)
Monocytes Relative: 7.9 % (ref 3.0–12.0)
Neutro Abs: 1.7 10*3/uL (ref 1.4–7.7)
Neutrophils Relative %: 34.6 % — ABNORMAL LOW (ref 43.0–77.0)
Platelets: 379 10*3/uL (ref 150.0–400.0)
RBC: 4.68 Mil/uL (ref 3.87–5.11)
RDW: 14.5 % (ref 11.5–15.5)
WBC: 4.9 10*3/uL (ref 4.0–10.5)

## 2024-02-23 LAB — LIPID PANEL
Cholesterol: 162 mg/dL (ref 0–200)
HDL: 97.7 mg/dL (ref >39.00)
LDL Cholesterol: 53 mg/dL (ref 0–99)
NonHDL: 63.85
Total CHOL/HDL Ratio: 2
Triglycerides: 55 mg/dL (ref 0.0–149.0)
VLDL: 11 mg/dL (ref 0.0–40.0)

## 2024-02-23 LAB — COMPREHENSIVE METABOLIC PANEL WITH GFR
ALT: 10 U/L (ref 0–35)
AST: 15 U/L (ref 0–37)
Albumin: 4.3 g/dL (ref 3.5–5.2)
Alkaline Phosphatase: 46 U/L (ref 39–117)
BUN: 17 mg/dL (ref 6–23)
CO2: 32 meq/L (ref 19–32)
Calcium: 9.3 mg/dL (ref 8.4–10.5)
Chloride: 101 meq/L (ref 96–112)
Creatinine, Ser: 0.47 mg/dL (ref 0.40–1.20)
GFR: 100.87 mL/min (ref >60.00)
Glucose, Bld: 93 mg/dL (ref 70–99)
Potassium: 3.9 meq/L (ref 3.5–5.1)
Sodium: 139 meq/L (ref 135–145)
Total Bilirubin: 0.5 mg/dL (ref 0.2–1.2)
Total Protein: 7.3 g/dL (ref 6.0–8.3)

## 2024-02-23 LAB — HEMOGLOBIN A1C: Hgb A1c MFr Bld: 6.1 % (ref 4.6–6.5)

## 2024-02-23 LAB — HM PAP SMEAR

## 2024-02-23 MED ORDER — TIRZEPATIDE 5 MG/0.5ML ~~LOC~~ SOAJ: 5.0000 mg | SUBCUTANEOUS | 0 refills | Status: AC

## 2024-02-23 NOTE — Assessment & Plan Note (Signed)
 Stable. Continue taking Nystatin  cream PRN.

## 2024-02-23 NOTE — Assessment & Plan Note (Signed)
 Stable A1c, but patient reports Semaglutide  1mg  is effective with helping her lose weight along with managing her diabetes. She reports she eats three meals a day and participating in regular exercise, such as walking and going exercise classes. Discussed about increasing medication to 2mg  injection or switching to Mounjaro. Decided to switch to Mounjaro 5mg  injection weekly x 4 weeks. Will follow up in 1 month to see how she tolerated change in medication. Will need to do foot exam at next appointment. Will request medical records from [facility] for her last eye exam. Ordered A1c, CMP, and CBC. Microalbumin/creatinine is UTD. Patient is on an ACE and statin.

## 2024-02-23 NOTE — Progress Notes (Signed)
 New Patient Office Visit  Subjective   Patient ID: Tanya [name], female    DOB: Nov 14, 1960  Age: 63 y.o. MRN: [id number]  CC:  Chief Complaint  Patient presents with   Establish Care    HPI Tanya [name] presents to establish care with new provider.  Previous primary care provider: [facility] with Dr. Vangie Genet.   Specialist: [facility]   HTN: Chronic. Patient is prescribed Amlodipine  2.5mg  daily,  Bisoprolol -HCTZ 10-6.25mg  daily, Hydralazine  25mg  TID, Olmesartan  40mg  daily, and Furosemide  40mg  BID. Does not monitor BP at home. Denies CP, SHOB, dizziness, lightheadedness, or HA. Has lower extremity edema if not taking Furosmide 40mg  BID.  BP Readings from Last 3 Encounters:  02/23/24 136/80  09/12/23 138/70  04/27/23 (!) 148/78    [place] Nerve Pain: Patient is prescribed Gabapenin 100mg  tablet TID for chronic pain. Patient reports she takes as needed. Was established with Dr. Lucienne Ryder based on previous PCP records.   Diabetes: Chronic. Patient is prescribed Metformin  XR 500mg  2 tablets BID and Semaglutide  1mg  injection weekly.  She reports she monitors her blood sugar every other day, ranging 90s-103.  Lab Results  Component Value Date   HGBA1C 6.1 02/23/2024    Skin Candidiasis: Patient is taking Nystatin  Cream as needed. She initially had rash underneath breast.   Hyperlipidemia: Chronic. Patient is prescribed Rosuvastatin  5mg  tablet, 1 tablet once a week.  Lab Results  Component Value Date   CHOL 162 02/23/2024   HDL 97.70 02/23/2024   LDLCALC 53 02/23/2024   TRIG 55.0 02/23/2024   CHOLHDL 2 02/23/2024    Outpatient Encounter Medications as of 02/23/2024  Medication Sig   amLODipine  (NORVASC ) 2.5 MG tablet TAKE 1 TAB EVERY NIGHT FOR BP   aspirin  EC 81 MG tablet Take 81 mg by mouth daily.   bisoprolol -hydrochlorothiazide  (ZIAC ) 10-6.25 MG tablet TAKE 1 TABLET BY MOUTH EVERY DAY FOR BLOOD PRESSURE   Cyanocobalamin   (VITAMIN B-12 SL) Place 1 tablet under the tongue daily.    fluticasone  (FLONASE ) 50 MCG/ACT nasal spray Place 2 sprays into both nostrils daily. (Patient taking differently: Place 2 sprays into both nostrils daily as needed for allergies.)   furosemide  (LASIX ) 40 MG tablet TAKE 1 TABLET BY MOUTH TWICE A DAY FOR BLOOD PRESSURE & FLUID RETENTION / ANKLE SWELLING   gabapentin  (NEURONTIN ) 100 MG capsule TAKE 1 CAPSULE 3 TIMES A DAY FOR CHRONIC PAIN (Patient taking differently: As needed)   hydrALAZINE  (APRESOLINE ) 25 MG tablet TAKE 1 TABLET 3 X /DAY FOR BP & HEART   Magnesium  250 MG TABS Take 250 mg by mouth daily.   metFORMIN  (GLUCOPHAGE -XR) 500 MG 24 hr tablet TAKE 2 TABLETS BY MOUTH TWICE A DAY WITH A MEAL FOR DIABETES   Multiple Vitamins-Minerals (MULTIVITAMIN PO) Take by mouth daily.   nystatin  cream (MYCOSTATIN ) Apply 1 Application topically 2 (two) times daily. (Patient taking differently: Apply 1 Application topically 2 (two) times daily. As needed)   olmesartan  (BENICAR ) 40 MG tablet TAKE 1 TABLET DAILY FOR BLOOD PRESSURE & DIABETIC KIDNEY PROTECTION   RESTASIS 0.05 % ophthalmic emulsion Place 1 drop into both eyes 3 (three) times daily as needed for dry eyes. As needed   rosuvastatin  (CRESTOR ) 5 MG tablet TAKE 1 TABLET BY MOUTH ONE TIME PER WEEK   tirzepatide (MOUNJARO) 5 MG/0.5ML Pen Inject 5 mg into the skin once a week for 28 days.   [DISCONTINUED] Semaglutide , 1 MG/DOSE, (OZEMPIC , 1 MG/DOSE,) 4 MG/3ML SOPN Inject  1 mg into the skin once a week.   No facility-administered encounter medications on file as of 02/23/2024.    Past Medical History:  Diagnosis Date   Allergy    Diabetes mellitus    Family history of adverse reaction to anesthesia    " My Mother would vomit "   Goiter    Hyperlipidemia    Hypertension    Morbid obesity (HCC) 12/24/2013   OSA (obstructive sleep apnea)    Palpitations 08/01/2016   Sarcoid    Sarcoidosis    SBO (small bowel obstruction) (HCC) 07/25/2020    Vitamin D  deficiency     Past Surgical History:  Procedure Laterality Date   ABDOMINAL HYSTERECTOMY     achiles tendon     right   EYE SURGERY     biopsy at [facility]   LAPAROTOMY N/A 07/27/2020   Procedure: EXPLORATORY LAPAROTOMY, SMALL BOWEL RESECTION;  Surgeon: Adalberto Acton, MD;  Location: [facility] OR;  Service: General;  Laterality: N/A;   TONSILLECTOMY      Family History  Problem Relation Age of Onset   Hypertension Mother    Diabetes Father    Heart disease Father    Hypertension Sister    Heart disease Sister    Hypertension Brother    Diabetes Paternal Grandmother    Heart disease Paternal Grandmother    Colon cancer Neg Hx    Esophageal cancer Neg Hx    Rectal cancer Neg Hx    Stomach cancer Neg Hx    Breast cancer Neg Hx     Social History   Socioeconomic History   Marital status: Married    Spouse name: Not on file   Number of children: 0   Years of education: Not on file   Highest education level: Associate degree: occupational, Scientist, product/process development, or vocational program  Occupational History   Not on file  Tobacco Use   Smoking status: Never   Smokeless tobacco: Never  Vaping Use   Vaping status: Never Used  Substance and Sexual Activity   Alcohol use: Yes    Comment: 2 glasses of wine a month   Drug use: No   Sexual activity: Yes    Partners: Male    Birth control/protection: Post-menopausal  Other Topics Concern   Not on file  Social History Narrative   Not on file   Social Drivers of Health   Financial Resource Strain: Low Risk  (02/23/2024)   Overall Financial Resource Strain (CARDIA)    Difficulty of Paying Living Expenses: Not hard at all  Food Insecurity: No Food Insecurity (02/23/2024)   Hunger Vital Sign    Worried About Running Out of Food in the Last Year: Never true    Ran Out of Food in the Last Year: Never true  Transportation Needs: No Transportation Needs (02/23/2024)   PRAPARE - Administrator, Civil Service (Medical):  No    Lack of Transportation (Non-Medical): No  Physical Activity: Sufficiently Active (02/23/2024)   Exercise Vital Sign    Days of Exercise per Week: 5 days    Minutes of Exercise per Session: 90 min  Stress: No Stress Concern Present (02/23/2024)   [employer] of Occupational Health - Occupational Stress Questionnaire    Feeling of Stress : Not at all  Social Connections: Socially Integrated (02/23/2024)   Social Connection and Isolation Panel [NHANES]    Frequency of Communication with Friends and Family: More than three times a week  Frequency of Social Gatherings with Friends and Family: More than three times a week    Attends Religious Services: More than 4 times per year    Active Member of Clubs or Organizations: Yes    Attends Engineer, structural: More than 4 times per year    Marital Status: Married  Catering manager Violence: Not At Risk (02/23/2024)   Humiliation, Afraid, Rape, and Kick questionnaire    Fear of Current or Ex-Partner: No    Emotionally Abused: No    Physically Abused: No    Sexually Abused: No    ROS See HPI above    Objective  BP 136/80   Pulse 62   Temp 98.2 F (36.8 C) (Oral)   Ht 5\' 4"  (1.626 m)   Wt 209 lb (94.8 kg)   SpO2 99%   BMI 35.87 kg/m   Physical Exam Vitals reviewed.  Constitutional:      General: She is not in acute distress.    Appearance: Normal appearance. She is not ill-appearing, toxic-appearing or diaphoretic.  HENT:     Head: Normocephalic and atraumatic.  Eyes:     General:        Right eye: No discharge.        Left eye: No discharge.     Conjunctiva/sclera: Conjunctivae normal.  Cardiovascular:     Rate and Rhythm: Normal rate and regular rhythm.     Heart sounds: Normal heart sounds. No murmur heard.    No friction rub. No gallop.  Pulmonary:     Effort: Pulmonary effort is normal. No respiratory distress.     Breath sounds: Normal breath sounds.  Musculoskeletal:        General: Normal  range of motion.  Skin:    General: Skin is warm and dry.  Neurological:     General: No focal deficit present.     Mental Status: She is alert and oriented to person, place, and time. Mental status is at baseline.  Psychiatric:        Mood and Affect: Mood normal.        Behavior: Behavior normal.        Thought Content: Thought content normal.        Judgment: Judgment normal.      Assessment & Plan:  Primary hypertension Assessment & Plan: Stable. Continue Amlodipine  2.5mg  daily,  Bisoprolol -HCTZ 10-6.25mg  daily, Hydralazine  25mg  TID, Olmesartan  40mg  daily, and Furosemide  40mg  BID. Ordered CBC and CMP.    Orders: -     CBC with Differential/Platelet -     Comprehensive metabolic panel with GFR  Hyperlipidemia associated with type 2 diabetes mellitus (HCC) Assessment & Plan: Stable. Continue with Rosuvastatin  5mg  once a week. Ordered lipid panel and CMP.   Orders: -     Comprehensive metabolic panel with GFR -     Lipid panel  Type 2 diabetes mellitus with obesity (HCC) Assessment & Plan: Stable A1c, but patient reports Semaglutide  1mg  is effective with helping her lose weight along with managing her diabetes. She reports she eats three meals a day and participating in regular exercise, such as walking and going exercise classes. Discussed about increasing medication to 2mg  injection or switching to Mounjaro. Decided to switch to Mounjaro 5mg  injection weekly x 4 weeks. Will follow up in 1 month to see how she tolerated change in medication. Will need to do foot exam at next appointment. Will request medical records from [facility] for her last eye exam. Ordered  A1c, CMP, and CBC. Microalbumin/creatinine is UTD. Patient is on an ACE and statin.   Orders: -     CBC with Differential/Platelet -     Comprehensive metabolic panel with GFR -     Hemoglobin A1c -     Tirzepatide; Inject 5 mg into the skin once a week for 28 days.  Dispense: 2 mL; Refill: 0  Chronic  right-sided low back pain with right-sided sciatica Assessment & Plan: Stable. Continue Gabapenin 100mg  tablet TID PRN for chronic pain.   Skin candidiasis Assessment & Plan: Stable. Continue taking Nystatin  cream PRN.    Colon cancer screening -     Cologuard  Need for pneumococcal vaccination -     Pneumococcal conjugate vaccine 20-valent  Encounter to establish care  1.Review health maintenance:  -Cologuard: Ordered  -Covid booster: Participates in obtaining them -Zoster vaccine: Will obtain at next visit -PNA vaccine: Ordered and admin vaccine.   Return in about 1 month (around 03/24/2024) for follow-up.   Carthel Castille, NP

## 2024-02-23 NOTE — Patient Instructions (Addendum)
-  It was nice to meet you today and look forward to taking care of you.  -Ordered cologuard for colon cancer screening. -Pneumococcal-20 vaccine given today. -Ordered lab. Office will call with lab results.  -Continue all medication, except discontinue Semaglutide  injections and start Mounjaro 5mg  injection weekly. -Follow up in 1 month.

## 2024-02-23 NOTE — Assessment & Plan Note (Signed)
 Stable. Continue Amlodipine  2.5mg  daily,  Bisoprolol -HCTZ 10-6.25mg  daily, Hydralazine  25mg  TID, Olmesartan  40mg  daily, and Furosemide  40mg  BID. Ordered CBC and CMP.

## 2024-02-23 NOTE — Assessment & Plan Note (Signed)
 Stable. Continue Gabapenin 100mg  tablet TID PRN for chronic pain.

## 2024-02-23 NOTE — Assessment & Plan Note (Signed)
 Stable. Continue with Rosuvastatin  5mg  once a week. Ordered lipid panel and CMP.

## 2024-02-24 ENCOUNTER — Encounter: Payer: Self-pay | Admitting: Family Medicine

## 2024-03-08 ENCOUNTER — Encounter: Payer: Self-pay | Admitting: Family Medicine

## 2024-03-19 ENCOUNTER — Ambulatory Visit: Payer: Self-pay | Admitting: Nurse Practitioner

## 2024-03-23 ENCOUNTER — Encounter: Payer: Self-pay | Admitting: Family Medicine

## 2024-03-23 ENCOUNTER — Ambulatory Visit: Admitting: Family Medicine

## 2024-03-23 ENCOUNTER — Ambulatory Visit (INDEPENDENT_AMBULATORY_CARE_PROVIDER_SITE_OTHER)

## 2024-03-23 VITALS — BP 130/78 | HR 60 | Temp 97.7°F | Ht 64.0 in | Wt 207.0 lb

## 2024-03-23 DIAGNOSIS — M79671 Pain in right foot: Secondary | ICD-10-CM | POA: Diagnosis not present

## 2024-03-23 DIAGNOSIS — E669 Obesity, unspecified: Secondary | ICD-10-CM | POA: Diagnosis not present

## 2024-03-23 DIAGNOSIS — Z23 Encounter for immunization: Secondary | ICD-10-CM

## 2024-03-23 DIAGNOSIS — M2011 Hallux valgus (acquired), right foot: Secondary | ICD-10-CM | POA: Diagnosis not present

## 2024-03-23 DIAGNOSIS — M21961 Unspecified acquired deformity of right lower leg: Secondary | ICD-10-CM | POA: Diagnosis not present

## 2024-03-23 DIAGNOSIS — Z7985 Long-term (current) use of injectable non-insulin antidiabetic drugs: Secondary | ICD-10-CM

## 2024-03-23 DIAGNOSIS — E1169 Type 2 diabetes mellitus with other specified complication: Secondary | ICD-10-CM | POA: Diagnosis not present

## 2024-03-23 DIAGNOSIS — M19071 Primary osteoarthritis, right ankle and foot: Secondary | ICD-10-CM | POA: Diagnosis not present

## 2024-03-23 MED ORDER — TIRZEPATIDE 5 MG/0.5ML ~~LOC~~ SOAJ: 5.0000 mg | SUBCUTANEOUS | 1 refills | Status: AC

## 2024-03-23 NOTE — Assessment & Plan Note (Signed)
 Through shared decision making, decided to continue Moujaro 5mg  injection weekly for the remaining 2 months until A1c is due since she had severe diarrhea. She is concerned with having diarrhea and going on a cruise. Concerned if dose is increased and side effects. Patient reports she has an upcoming ophthalmology eye exam. Recommend patient to have office send her visit note. Foot exam completed today.

## 2024-03-23 NOTE — Progress Notes (Signed)
 Established Patient Office Visit   Subjective:  Patient ID: Tanya [name], female    DOB: 1960/12/25  Age: 63 y.o. MRN: [id number]  Chief Complaint  Patient presents with   Medical Management of Chronic Issues    HPI Diabetes: On previous appointment, discontinued Semaglutide  1mg  but transitioned to Mounjaro 5mg  injection for diabetes and to help lose more weight. She has had 4 injections, last one this past Sunday. She reports she did have severe diarrhea but it has improved. Denies bloating, GERD symptoms, nausea, or vomiting. Measuring blood sugars every other day, ranaging high 90s -110. Patient has lost 2Ibs in 1 month.   Patient is concerned about her right foot. About 7 weeks she noticed a knot/deformity on the anterior foot. Reports her foot is more snug with wearing certain shoes. Denies any actual pain or numbness/tingling. Denies any recent injury. Also, notes chronic generalized mild swelling in foot, but had surgery years ago in the foot.  ROS See HPI above    Objective:   BP 130/78   Pulse 60   Temp 97.7 F (36.5 C) (Oral)   Ht 5\' 4"  (1.626 m)   Wt 207 lb (93.9 kg)   SpO2 98%   BMI 35.53 kg/m  Wt Readings from Last 3 Encounters:  03/23/24 207 lb (93.9 kg)  02/23/24 209 lb (94.8 kg)  09/12/23 206 lb (93.4 kg)      Physical Exam Vitals reviewed.  Constitutional:      General: She is not in acute distress.    Appearance: Normal appearance. She is not ill-appearing, toxic-appearing or diaphoretic.  Eyes:     General:        Right eye: No discharge.        Left eye: No discharge.     Conjunctiva/sclera: Conjunctivae normal.  Cardiovascular:     Rate and Rhythm: Normal rate and regular rhythm.     Pulses:          Dorsalis pedis pulses are 3+ on the right side and 3+ on the left side.     Heart sounds: Normal heart sounds. No murmur heard.    No friction rub. No gallop.  Pulmonary:     Effort: Pulmonary effort is normal. No respiratory distress.      Breath sounds: Normal breath sounds.  Musculoskeletal:        General: Normal range of motion.     Right foot: Normal range of motion. Deformity (Anterior, hard bony knot, non tender) present.     Left foot: Normal range of motion.  Feet:     Right foot:     Protective Sensation: 10 sites tested.  10 sites sensed.     Skin integrity: Skin integrity normal.     Toenail Condition: Right toenails are normal.     Left foot:     Protective Sensation: 10 sites tested.  10 sites sensed.     Skin integrity: Skin integrity normal.     Toenail Condition: Left toenails are normal.  Skin:    General: Skin is warm and dry.  Neurological:     General: No focal deficit present.     Mental Status: She is alert and oriented to person, place, and time. Mental status is at baseline.  Psychiatric:        Mood and Affect: Mood normal.        Behavior: Behavior normal.        Thought Content: Thought content normal.  Judgment: Judgment normal.      Assessment & Plan:  Type 2 diabetes mellitus with obesity (HCC) Assessment & Plan: Through shared decision making, decided to continue Moujaro 5mg  injection weekly for the remaining 2 months until A1c is due since she had severe diarrhea. She is concerned with having diarrhea and going on a cruise. Concerned if dose is increased and side effects. Patient reports she has an upcoming ophthalmology eye exam. Recommend patient to have office send her visit note. Foot exam completed today.   Orders: -     Tirzepatide; Inject 5 mg into the skin once a week for 8 doses.  Dispense: 2 mL; Refill: 1  Deformity of right foot -     DG Foot Complete Right; Future  Immunization due -     Varicella-zoster vaccine IM  -Ordered x-ray of right foot for concerns of the knot on the right foot.   -Zoster vaccine (1st vaccine) provided today. She will need second vaccine in 2 months at her next appointment.  -Encouraged to please complete cologuard and send in.   -Follow up in 2 months for chronic management.   Return in about 2 months (around 05/23/2024).   Meila Berke, NP

## 2024-03-23 NOTE — Patient Instructions (Addendum)
-  Through shared decision making, decided to continue Mounjaro 5mg  injection weekly for the remaining 2 months until A1c is due.  -When you have your eye exam, please have the office send over your office visit notes. -Ordered x-ray of right foot for concerns of the knot on the right foot. Office will call with results and you will see them on MyChart.  -Zoster vaccine (1st vaccine) provided today. You will need second vaccine in 2 months at your next appointment.  -Please complete cologuard and send in.  -Follow up in 2 months for chronic management.

## 2024-03-27 ENCOUNTER — Ambulatory Visit: Payer: Self-pay | Admitting: Family Medicine

## 2024-04-04 ENCOUNTER — Ambulatory Visit: Admitting: Family Medicine

## 2024-05-10 ENCOUNTER — Other Ambulatory Visit: Payer: Self-pay | Admitting: Family Medicine

## 2024-05-10 DIAGNOSIS — E1169 Type 2 diabetes mellitus with other specified complication: Secondary | ICD-10-CM

## 2024-05-23 ENCOUNTER — Encounter: Payer: Self-pay | Admitting: Family Medicine

## 2024-05-23 ENCOUNTER — Ambulatory Visit (INDEPENDENT_AMBULATORY_CARE_PROVIDER_SITE_OTHER): Admitting: Family Medicine

## 2024-05-23 VITALS — BP 142/68 | HR 64 | Temp 98.2°F | Ht 64.0 in | Wt 205.0 lb

## 2024-05-23 DIAGNOSIS — B372 Candidiasis of skin and nail: Secondary | ICD-10-CM

## 2024-05-23 DIAGNOSIS — Z7985 Long-term (current) use of injectable non-insulin antidiabetic drugs: Secondary | ICD-10-CM

## 2024-05-23 DIAGNOSIS — Z7984 Long term (current) use of oral hypoglycemic drugs: Secondary | ICD-10-CM

## 2024-05-23 DIAGNOSIS — M5441 Lumbago with sciatica, right side: Secondary | ICD-10-CM

## 2024-05-23 DIAGNOSIS — I10 Essential (primary) hypertension: Secondary | ICD-10-CM

## 2024-05-23 DIAGNOSIS — Z23 Encounter for immunization: Secondary | ICD-10-CM

## 2024-05-23 DIAGNOSIS — E785 Hyperlipidemia, unspecified: Secondary | ICD-10-CM | POA: Diagnosis not present

## 2024-05-23 DIAGNOSIS — E669 Obesity, unspecified: Secondary | ICD-10-CM | POA: Diagnosis not present

## 2024-05-23 DIAGNOSIS — E1169 Type 2 diabetes mellitus with other specified complication: Secondary | ICD-10-CM

## 2024-05-23 DIAGNOSIS — G8929 Other chronic pain: Secondary | ICD-10-CM

## 2024-05-23 LAB — COMPREHENSIVE METABOLIC PANEL WITH GFR
ALT: 16 U/L (ref 0–35)
AST: 19 U/L (ref 0–37)
Albumin: 4.3 g/dL (ref 3.5–5.2)
Alkaline Phosphatase: 60 U/L (ref 39–117)
BUN: 18 mg/dL (ref 6–23)
CO2: 32 meq/L (ref 19–32)
Calcium: 9.4 mg/dL (ref 8.4–10.5)
Chloride: 98 meq/L (ref 96–112)
Creatinine, Ser: 0.54 mg/dL (ref 0.40–1.20)
GFR: 97.39 mL/min (ref >60.00)
Glucose, Bld: 87 mg/dL (ref 70–99)
Potassium: 3.6 meq/L (ref 3.5–5.1)
Sodium: 138 meq/L (ref 135–145)
Total Bilirubin: 0.3 mg/dL (ref 0.2–1.2)
Total Protein: 7.6 g/dL (ref 6.0–8.3)

## 2024-05-23 LAB — LIPID PANEL
Cholesterol: 176 mg/dL (ref 0–200)
HDL: 103.5 mg/dL (ref >39.00)
LDL Cholesterol: 60 mg/dL (ref 0–99)
NonHDL: 72.3
Total CHOL/HDL Ratio: 2
Triglycerides: 60 mg/dL (ref 0.0–149.0)
VLDL: 12 mg/dL (ref 0.0–40.0)

## 2024-05-23 LAB — HEMOGLOBIN A1C: Hgb A1c MFr Bld: 6.3 % (ref 4.6–6.5)

## 2024-05-23 MED ORDER — MOUNJARO 7.5 MG/0.5ML ~~LOC~~ SOAJ: 7.5000 mg | SUBCUTANEOUS | 0 refills | Status: DC

## 2024-05-23 NOTE — Assessment & Plan Note (Signed)
 Stable. Continue taking Nystatin  cream PRN.

## 2024-05-23 NOTE — Assessment & Plan Note (Signed)
 Stable. Continue Metformin  XR 500mg  2 tablets BID. Increased Mounjaro  to 7.5mg  weekly injection. Requesting records from ophthalmology eye exam. Ordered CMP and A1c.

## 2024-05-23 NOTE — Assessment & Plan Note (Signed)
 Stable. Continue with Rosuvastatin  5mg  once a week. Ordered lipid panel and CMP.

## 2024-05-23 NOTE — Assessment & Plan Note (Signed)
 Stable. Continue Gabapenin 100mg  tablet TID PRN for chronic pain.

## 2024-05-23 NOTE — Patient Instructions (Addendum)
-  It was great to see you today. -Second Zoster (Shingles) vaccine given today.  -Increased Mounjaro  to 7.5mg  injection every 7 days and continue all other medications.  -Ordered labs. Office will call with results and will be available on MyChart. -Please complete the cologuard and send it in the mail.  -Follow up in 4 months for a physical.

## 2024-05-23 NOTE — Progress Notes (Signed)
 Established Patient Office Visit   Subjective:  Patient ID: Tanya [name], female    DOB: [birth year]  Age: 63 y.o. MRN: [id number]  Chief Complaint  Patient presents with   Medical Management of Chronic Issues    2  month follow up    HPI HTN: Chronic. Patient is prescribed Amlodipine  2.5mg  daily,  Bisoprolol -HCTZ 10-6.25mg  daily, Hydralazine  25mg  TID, Olmesartan  40mg  daily, and Furosemide  40mg  BID. Does monitor BP at home sometimes. Been ranging 130-140/60-80s. Denies CP, SHOB, dizziness, lightheadedness, or HA. Has lower extremity edema if not taking Furosmide 40mg  BID.  BP Readings from Last 3 Encounters:  05/23/24 (!) 142/68  03/23/24 130/78  02/23/24 136/80     [place] Nerve Pain: Patient is prescribed Gabapenin 100mg  tablet TID for chronic pain. Patient reports she takes as needed. Was established with Dr. Vernetta based on previous PCP records.    Diabetes: Chronic. Patient is prescribed Metformin  XR 500mg  2 tablets BID and Mounjaro  5mg  weekly injection.  She reports she monitors her blood sugar every other day, ranging 90s-103.  Lab Results  Component Value Date   HGBA1C 6.1 02/23/2024    Skin Candidiasis: Patient is taking Nystatin  Cream as needed. She initially had rash underneath breast.    Hyperlipidemia: Chronic. Patient is prescribed Rosuvastatin  5mg  tablet, 1 tablet once a week.  Lab Results  Component Value Date   CHOL 162 02/23/2024   HDL 97.70 02/23/2024   LDLCALC 53 02/23/2024   TRIG 55.0 02/23/2024   CHOLHDL 2 02/23/2024    ROS See HPI above     Objective:   BP (!) 142/68   Pulse 64   Temp 98.2 F (36.8 C) (Oral)   Ht 5' 4 (1.626 m)   Wt 205 lb (93 kg)   SpO2 99%   BMI 35.19 kg/m    Physical Exam Vitals reviewed.  Constitutional:      General: She is not in acute distress.    Appearance: Normal appearance. She is obese. She is not ill-appearing, toxic-appearing or diaphoretic.  HENT:     Head: Normocephalic and atraumatic.  Eyes:      General:        Right eye: No discharge.        Left eye: No discharge.     Conjunctiva/sclera: Conjunctivae normal.  Cardiovascular:     Rate and Rhythm: Normal rate and regular rhythm.     Heart sounds: Normal heart sounds. No murmur heard.    No friction rub. No gallop.  Pulmonary:     Effort: Pulmonary effort is normal. No respiratory distress.     Breath sounds: Normal breath sounds.  Musculoskeletal:        General: Normal range of motion.  Skin:    General: Skin is warm and dry.  Neurological:     General: No focal deficit present.     Mental Status: She is alert and oriented to person, place, and time. Mental status is at baseline.  Psychiatric:        Mood and Affect: Mood normal.        Behavior: Behavior normal.        Thought Content: Thought content normal.        Judgment: Judgment normal.     Assessment & Plan:  Primary hypertension Assessment & Plan: Stable. Continue Amlodipine  2.5mg  daily,  Bisoprolol -HCTZ 10-6.25mg  daily, Hydralazine  25mg  TID, Olmesartan  40mg  daily, and Furosemide  40mg  BID. Ordered CMP.   Orders: -     Comprehensive metabolic  panel with GFR  Immunization due -     Varicella-zoster vaccine IM  Hyperlipidemia associated with type 2 diabetes mellitus (HCC) Assessment & Plan: Stable. Continue with Rosuvastatin  5mg  once a week. Ordered lipid panel and CMP.   Orders: -     Comprehensive metabolic panel with GFR -     Lipid panel  Type 2 diabetes mellitus with obesity (HCC) Assessment & Plan: Stable. Continue Metformin  XR 500mg  2 tablets BID. Increased Mounjaro  to 7.5mg  weekly injection. Requesting records from ophthalmology eye exam. Ordered CMP and A1c.   Orders: -     Mounjaro ; Inject 7.5 mg into the skin once a week.  Dispense: 6 mL; Refill: 0 -     Comprehensive metabolic panel with GFR -     Hemoglobin A1c  Skin candidiasis Assessment & Plan: Stable. Continue taking Nystatin  cream PRN.     Chronic right-sided low back pain  with right-sided sciatica Assessment & Plan: Stable. Continue Gabapenin 100mg  tablet TID PRN for chronic pain.     1.Review health maintenance:  -Cologuard: has box and needs to complete -Ophthalmology exam: [facility]  -Zoster vaccine: Had 2nd vaccine during visit   Return in about 4 months (around 09/23/2024) for physical.   Eeshan Verbrugge, NP

## 2024-05-23 NOTE — Assessment & Plan Note (Signed)
 Stable. Continue Amlodipine  2.5mg  daily,  Bisoprolol -HCTZ 10-6.25mg  daily, Hydralazine  25mg  TID, Olmesartan  40mg  daily, and Furosemide  40mg  BID. Ordered CMP.

## 2024-05-29 ENCOUNTER — Ambulatory Visit: Payer: Self-pay | Admitting: Family Medicine

## 2024-08-04 ENCOUNTER — Other Ambulatory Visit: Payer: Self-pay | Admitting: Family Medicine

## 2024-08-07 ENCOUNTER — Encounter: Payer: Self-pay | Admitting: Family Medicine

## 2024-08-07 ENCOUNTER — Other Ambulatory Visit: Payer: Self-pay

## 2024-08-07 DIAGNOSIS — E669 Obesity, unspecified: Secondary | ICD-10-CM

## 2024-08-07 MED ORDER — MOUNJARO 7.5 MG/0.5ML ~~LOC~~ SOAJ: 7.5000 mg | SUBCUTANEOUS | 0 refills | Status: DC

## 2024-08-13 ENCOUNTER — Ambulatory Visit: Admitting: Family Medicine

## 2024-08-13 ENCOUNTER — Ambulatory Visit: Payer: Self-pay

## 2024-08-13 ENCOUNTER — Encounter: Payer: Self-pay | Admitting: Family Medicine

## 2024-08-13 VITALS — BP 130/76 | HR 60 | Temp 98.0°F | Ht 64.0 in | Wt 197.0 lb

## 2024-08-13 DIAGNOSIS — M25511 Pain in right shoulder: Secondary | ICD-10-CM | POA: Diagnosis not present

## 2024-08-13 MED ORDER — PREDNISONE 10 MG PO TABS: ORAL_TABLET | ORAL | 0 refills | Status: AC

## 2024-08-13 MED ORDER — METHOCARBAMOL 500 MG PO TABS: 500.0000 mg | ORAL_TABLET | Freq: Three times a day (TID) | ORAL | 0 refills | Status: AC | PRN

## 2024-08-13 NOTE — Telephone Encounter (Signed)
Appt today with pcp

## 2024-08-13 NOTE — Progress Notes (Signed)
 Established Patient Office Visit   Subjective:  Patient ID: Tanya [name], female    DOB: Sep 28, 1961  Age: 63 y.o. MRN: [id number]  Chief Complaint  Patient presents with   Arm Pain    Right arm pain     Arm Pain    Patient is complaining of right shoulder pain. Started a week ago, gradually. She reports she started Piliates about 3 weeks ago, but does not recall injury or pulling on shoulder. Pain is intermittent, mostly occurring when using the right shoulder or raising the right arm. She has still been going to workout, just not lifting as high as usual. Pain described as aching, pulling. Denies numbness or tingling.  She has been taking Advil  and applying heat with some relief.  ROS See HPI above     Objective:   BP 130/76   Pulse 60   Temp 98 F (36.7 C) (Oral)   Ht 5' 4 (1.626 m)   Wt 197 lb (89.4 kg)   SpO2 96%   BMI 33.81 kg/m    Physical Exam Vitals reviewed.  Constitutional:      General: She is not in acute distress.    Appearance: Normal appearance. She is obese. She is not ill-appearing, toxic-appearing or diaphoretic.  HENT:     Head: Normocephalic and atraumatic.  Eyes:     General:        Right eye: No discharge.        Left eye: No discharge.     Conjunctiva/sclera: Conjunctivae normal.  Cardiovascular:     Rate and Rhythm: Normal rate and regular rhythm.     Heart sounds: Normal heart sounds. No murmur heard.    No friction rub. No gallop.  Pulmonary:     Effort: Pulmonary effort is normal. No respiratory distress.     Breath sounds: Normal breath sounds.  Musculoskeletal:     Right shoulder: No swelling, deformity, tenderness or crepitus. Decreased range of motion (Pain with raising arm.).  Skin:    General: Skin is warm and dry.  Neurological:     General: No focal deficit present.     Mental Status: She is alert and oriented to person, place, and time. Mental status is at baseline.  Psychiatric:        Mood and Affect: Mood normal.         Behavior: Behavior normal.        Thought Content: Thought content normal.        Judgment: Judgment normal.      Assessment & Plan:  Acute pain of right shoulder -     predniSONE ; Take 6 tablets (60 mg total) by mouth daily with breakfast for 1 day, THEN 5 tablets (50 mg total) daily with breakfast for 1 day, THEN 4 tablets (40 mg total) daily with breakfast for 1 day, THEN 3 tablets (30 mg total) daily with breakfast for 1 day, THEN 2 tablets (20 mg total) daily with breakfast for 1 day, THEN 1 tablet (10 mg total) daily with breakfast for 1 day.  Dispense: 21 tablet; Refill: 0 -     Methocarbamol ; Take 1 tablet (500 mg total) by mouth every 8 (eight) hours as needed for up to 5 days for muscle spasms.  Dispense: 15 tablet; Refill: 0  -Prescribed Prednisone  10mg  tablet, 6 day taper dose and Robaxin  500mg  tablet, 1 tablet every 8 hours as needed up to 5 days for muscle spasm for right shoulder pain. Caution: Robaxin   causes drowsiness. Do not take additional NSAIDS (Advil /Ibuprofen ) while taking Prednisone .  -Send a MyChart message to update on how you feeling in 1 week.  -Suspect this is muscle pain from overuse and different use. Try to rest shoulder and not use as much while exercising.   Fama Muenchow, NP

## 2024-08-13 NOTE — Patient Instructions (Addendum)
-  It was good to see you today. -Prescribed Prednisone  10mg  tablet, 6 day taper dose and Robaxin  500mg  tablet, 1 tablet every 8 hours as needed up to 5 days for muscle spasm for right shoulder pain. Caution: Robaxin  causes drowsiness. Do not take additional NSAIDS (Advil /Ibuprofen ) while taking Prednisone .  -Send a MyChart message to update on how you feeling in 1 week.  -Try to rest shoulder and not use as much while exercising.

## 2024-08-13 NOTE — Telephone Encounter (Signed)
 FYI Only or Action Required?: Action required by provider: request for appointment.  Patient was last seen in primary care on 05/23/2024 by Tanya Philippe SAUNDERS, NP.  Called Nurse Triage reporting Arm Pain.  Symptoms began a week ago.  Interventions attempted: OTC medications: aleve.  Symptoms are: unchanged.  Triage Disposition: See PCP When Office is Open (Within 3 Days)  Patient/caregiver understands and will follow disposition?: YesCopied from CRM #[id number]. Topic: Clinical - Red Word Triage >> Aug 13, 2024  9:03 AM Adelita E wrote: [facility] that prompted transfer to Nurse Triage: Right arm pain. Patient stated it has been going on for one week, when raised, it feels painful and stiff. Reason for Disposition  [1] MODERATE pain (e.g., interferes with normal activities) AND [2] present > 3 days  Answer Assessment - Initial Assessment Questions Arm hurts to raise. Not sure if I pulled muscle working out. No other symptoms.    1. ONSET: When did the pain start?     1 week ago  2. LOCATION: Where is the pain located?     Right arm  3. PAIN: How bad is the pain? (Scale 0-10; or none, mild, moderate, severe)     5 4. WORK OR EXERCISE: Has there been any recent work or exercise that involved this part of the body?     Exercise  5. CAUSE: What do you think is causing the arm pain?     Working out  6. OTHER SYMPTOMS: Do you have any other symptoms? (e.g., neck pain, swelling, rash, fever, numbness, weakness)     Denies  Protocols used: Arm Pain-A-AH

## 2024-09-04 ENCOUNTER — Encounter: Payer: Self-pay | Admitting: Family Medicine

## 2024-09-04 DIAGNOSIS — M25511 Pain in right shoulder: Secondary | ICD-10-CM

## 2024-09-07 ENCOUNTER — Ambulatory Visit

## 2024-09-07 ENCOUNTER — Other Ambulatory Visit

## 2024-09-07 DIAGNOSIS — M25511 Pain in right shoulder: Secondary | ICD-10-CM | POA: Diagnosis not present

## 2024-09-10 ENCOUNTER — Other Ambulatory Visit: Payer: Self-pay | Admitting: Family Medicine

## 2024-09-10 DIAGNOSIS — Z1231 Encounter for screening mammogram for malignant neoplasm of breast: Secondary | ICD-10-CM

## 2024-09-12 ENCOUNTER — Ambulatory Visit: Payer: Self-pay | Admitting: Family Medicine

## 2024-09-12 ENCOUNTER — Encounter: Payer: BC Managed Care – PPO | Admitting: Nurse Practitioner

## 2024-09-14 ENCOUNTER — Ambulatory Visit: Admitting: Orthopaedic Surgery

## 2024-09-14 DIAGNOSIS — G8929 Other chronic pain: Secondary | ICD-10-CM | POA: Diagnosis not present

## 2024-09-14 DIAGNOSIS — M25511 Pain in right shoulder: Secondary | ICD-10-CM

## 2024-09-14 NOTE — Progress Notes (Signed)
 Office Visit Note   Patient: Tanya [name]           Date of Birth: 1961-06-13           MRN: [id number] Visit Date: 09/14/2024              Requested by: Billy Philippe JONELLE, NP [address] [place],  [place] [address] PCP: Billy Philippe JONELLE, NP   Assessment & Plan: Visit Diagnoses:  1. Chronic right shoulder pain     Plan: History of Present Illness Tanya [name] is a 63 year old female who presents with right shoulder pain. She was referred by her regular medical doctor for further evaluation after an x-ray showed degenerative changes.  She has experienced right shoulder pain for one month, with an intensity of 5 to 6 out of 10. The pain worsens with movements such as raising her hand, showering, or reaching across her body. There is no history of injury or trauma to the shoulder.  She has tried prednisone  and muscle relaxers with only temporary relief. An x-ray showed degenerative changes, leading to her referral to orthopedics.  She is right-handed and works in clinical biochemist, which involves typing. The pain does not disturb her sleep. She has avoided using weights with her right arm during exercise classes for the past one to two months due to the pain. She walks five days a week.  Pain is noted with certain movements during the review of symptoms.  Physical Exam MUSCULOSKELETAL: Right shoulder with normal forward flexion, external rotation to 50 degrees, and abduction greater than 90 degrees. No pain with Hawkins impingement or Neer. Good strength to MMT of RC. Pain with empty can and with cross-body adduction.  Results RADIOLOGY Right shoulder X-ray: Degenerative changes, acromioclavicular [facility]) joint arthritis, degenerative spurring on the greater tuberosity, calcific tendinitis in the anterior shoulder, no fracture or dislocation.  Assessment and Plan Right shoulder calcific tendinitis and AC joint osteoarthritis Chronic right shoulder pain with  degenerative changes and calcific deposits. Condition typically self-limiting. - Scheduled cortisone injection with ultrasound guidance by Dr. Burnetta. - Advised avoidance of strenuous right shoulder activities for 3-4 weeks post-injection. - Permitted lower body exercises and walking.  Follow-Up Instructions: No follow-ups on file.   Orders:  No orders of the defined types were placed in this encounter.  No orders of the defined types were placed in this encounter.     Procedures: No procedures performed   Clinical Data: No additional findings.   Subjective: Chief Complaint  Patient presents with   Right Shoulder - Pain    HPI  Review of Systems  Constitutional: Negative.   HENT: Negative.    Eyes: Negative.   Respiratory: Negative.    Cardiovascular: Negative.   Endocrine: Negative.   Musculoskeletal: Negative.   Neurological: Negative.   Hematological: Negative.   Psychiatric/Behavioral: Negative.    All other systems reviewed and are negative.    Objective: Vital Signs: There were no vitals taken for this visit.  Physical Exam Vitals and nursing note reviewed.  Constitutional:      Appearance: She is well-developed.  HENT:     Head: Atraumatic.     Nose: Nose normal.  Eyes:     Extraocular Movements: Extraocular movements intact.  Cardiovascular:     Pulses: Normal pulses.  Pulmonary:     Effort: Pulmonary effort is normal.  Abdominal:     Palpations: Abdomen is soft.  Musculoskeletal:     Cervical back:  Neck supple.  Skin:    General: Skin is warm.     Capillary Refill: Capillary refill takes less than 2 seconds.  Neurological:     Mental Status: She is alert. Mental status is at baseline.  Psychiatric:        Behavior: Behavior normal.        Thought Content: Thought content normal.        Judgment: Judgment normal.     Ortho Exam  Specialty Comments:  Electrodiagnostic study of the left upper limb 12/21/2018 Impression: The above  electrodiagnostic study is ABNORMAL and reveals evidence of a moderate left median nerve entrapment at the wrist (carpal tunnel syndrome) affecting sensory and motor components.    There is no significant electrodiagnostic evidence of any other focal nerve entrapment, brachial plexopathy or cervical radiculopathy.    Recommendations: 1.  Follow-up with referring physician. 2.  Continue current management of symptoms. 3.  Continue use of resting splint at night-time and as needed during the day. 4.  Suggest surgical evaluation.  Imaging: No results found.   PMFS History: Patient Active Problem List   Diagnosis Date Noted   Skin candidiasis 02/23/2024   OSA (obstructive sleep apnea)    S/P small bowel resection 09/08/2020   B12 deficiency 09/08/2020   Carpal tunnel syndrome, left upper limb 12/25/2018   Chronic right-sided low back pain with right-sided sciatica 06/01/2017   Tortuous aorta 05/20/2017   Obesity (BMI 30.0-34.9) 12/24/2013   Hypertension    Hyperlipidemia associated with type 2 diabetes mellitus (HCC)    Type 2 diabetes mellitus with obesity    Environmental allergies    Obesity hypoventilation syndrome (HCC)     Goiter    Vitamin D  deficiency    Past Medical History:  Diagnosis Date   Allergy    Diabetes mellitus    Family history of adverse reaction to anesthesia     My Mother would vomit    Goiter    Hyperlipidemia    Hypertension    Morbid obesity (HCC) 12/24/2013   OSA (obstructive sleep apnea)    Palpitations 08/01/2016   Sarcoid    Sarcoidosis    SBO (small bowel obstruction) (HCC) 07/25/2020   Vitamin D  deficiency     Family History  Problem Relation Age of Onset   Hypertension Mother    Diabetes Father    Heart disease Father    Hypertension Sister    Heart disease Sister    Hypertension Brother    Diabetes Paternal Grandmother    Heart disease Paternal Grandmother    Colon cancer Neg Hx    Esophageal cancer Neg Hx    Rectal cancer  Neg Hx    Stomach cancer Neg Hx    Breast cancer Neg Hx     Past Surgical History:  Procedure Laterality Date   ABDOMINAL HYSTERECTOMY     achiles tendon     right   EYE SURGERY     biopsy at [facility]   LAPAROTOMY N/A 07/27/2020   Procedure: EXPLORATORY LAPAROTOMY, SMALL BOWEL RESECTION;  Surgeon: Signe Mitzie LABOR, MD;  Location: [facility] OR;  Service: General;  Laterality: N/A;   TONSILLECTOMY     Social History   Occupational History   Not on file  Tobacco Use   Smoking status: Never   Smokeless tobacco: Never  Vaping Use   Vaping status: Never Used  Substance and Sexual Activity   Alcohol use: Yes    Comment: 2 glasses of wine a  month   Drug use: No   Sexual activity: Yes    Partners: Male    Birth control/protection: Post-menopausal

## 2024-09-18 ENCOUNTER — Ambulatory Visit
Admission: RE | Admit: 2024-09-18 | Discharge: 2024-09-18 | Disposition: A | Discharge status: Home / Self Care | Source: Ambulatory Visit | Attending: Family Medicine | Admitting: Family Medicine

## 2024-09-18 DIAGNOSIS — Z1231 Encounter for screening mammogram for malignant neoplasm of breast: Secondary | ICD-10-CM

## 2024-09-21 ENCOUNTER — Ambulatory Visit: Admitting: Sports Medicine

## 2024-09-21 ENCOUNTER — Other Ambulatory Visit: Payer: Self-pay

## 2024-09-21 ENCOUNTER — Encounter: Payer: Self-pay | Admitting: Sports Medicine

## 2024-09-21 DIAGNOSIS — M25511 Pain in right shoulder: Secondary | ICD-10-CM | POA: Diagnosis not present

## 2024-09-21 DIAGNOSIS — M19011 Primary osteoarthritis, right shoulder: Secondary | ICD-10-CM

## 2024-09-21 DIAGNOSIS — M7531 Calcific tendinitis of right shoulder: Secondary | ICD-10-CM | POA: Diagnosis not present

## 2024-09-21 DIAGNOSIS — G8929 Other chronic pain: Secondary | ICD-10-CM

## 2024-09-21 MED ORDER — METHYLPREDNISOLONE ACETATE 40 MG/ML IJ SUSP
60.0000 mg | INTRAMUSCULAR | Status: AC | PRN
  Administered 2024-09-21: 60 mg via INTRA_ARTICULAR

## 2024-09-21 MED ORDER — BUPIVACAINE HCL 0.25 % IJ SOLN
2.0000 mL | INTRAMUSCULAR | Status: AC | PRN
  Administered 2024-09-21: 2 mL via INTRA_ARTICULAR

## 2024-09-21 MED ORDER — LIDOCAINE HCL 1 % IJ SOLN
2.0000 mL | INTRAMUSCULAR | Status: AC | PRN
  Administered 2024-09-21: 2 mL

## 2024-09-21 NOTE — Progress Notes (Signed)
   Procedure Note  Patient: Tanya [name]             Date of Birth: [birth year]           MRN: [id number]             Visit Date: 09/21/2024  Procedures: Visit Diagnoses:  1. Chronic right shoulder pain   2. Primary osteoarthritis, right shoulder   3. Calcific tendinitis of right shoulder    Large Joint Inj: R glenohumeral on 09/21/2024 10:22 AM Indications: pain and diagnostic evaluation Details: 22 G 3.5 in needle, ultrasound-guided posterior approach Medications: 2 mL lidocaine  1 %; 2 mL bupivacaine  0.25 %; 60 mg methylPREDNISolone  acetate 40 MG/ML Outcome: tolerated well, no immediate complications  US -guided glenohumeral joint injection, right shoulder After discussion on risks/benefits/indications, informed verbal consent was obtained. A timeout was then performed. The patient was positioned lying lateral recumbent on examination table. The patient's shoulder was prepped with betadine and multiple alcohol swabs and utilizing ultrasound guidance, the patient's glenohumeral joint was identified on ultrasound. Using ultrasound guidance a 22-gauge, 3.5 inch needle with a mixture of 2:2:1.5 cc's lidocaine :bupivicaine:depomedrol was directed from a lateral to medial direction via in-plane technique into the glenohumeral joint with visualization of appropriate spread of injectate into the joint. Patient tolerated the procedure well without immediate complications.      Procedure, treatment alternatives, risks and benefits explained, specific risks discussed. Consent was given by the patient. Immediately prior to procedure a time out was called to verify the correct patient, procedure, equipment, support staff and site/side marked as required. Patient was prepped and draped in the usual sterile fashion.      - patient tolerated procedure well, discussed post-injection protocol - Discussed I would like to see her back between 2-3 weeks to gauge her response to the injection.  Only if she  does not receive significant relief, may we consider ultrasound-guided SIJ/calcific tendon guided injection. Consider HEP/PT - will then ultimately get her back to Dr. Jerri as needed  Lonell Sprang, DO Primary Care Sports Medicine Physician  [facility]  This note was dictated using Dragon naturally speaking software and may contain errors in syntax, spelling, or content which have not been identified prior to signing this note.

## 2024-09-24 DIAGNOSIS — H40053 Ocular hypertension, bilateral: Secondary | ICD-10-CM | POA: Diagnosis not present

## 2024-09-24 DIAGNOSIS — E119 Type 2 diabetes mellitus without complications: Secondary | ICD-10-CM | POA: Diagnosis not present

## 2024-09-25 ENCOUNTER — Encounter: Payer: Self-pay | Admitting: Family Medicine

## 2024-09-25 ENCOUNTER — Ambulatory Visit: Admitting: Family Medicine

## 2024-09-25 VITALS — BP 130/72 | HR 56 | Temp 97.6°F | Ht 64.0 in | Wt 191.0 lb

## 2024-09-25 DIAGNOSIS — I10 Essential (primary) hypertension: Secondary | ICD-10-CM | POA: Diagnosis not present

## 2024-09-25 DIAGNOSIS — E538 Deficiency of other specified B group vitamins: Secondary | ICD-10-CM

## 2024-09-25 DIAGNOSIS — E1169 Type 2 diabetes mellitus with other specified complication: Secondary | ICD-10-CM | POA: Diagnosis not present

## 2024-09-25 DIAGNOSIS — E559 Vitamin D deficiency, unspecified: Secondary | ICD-10-CM | POA: Diagnosis not present

## 2024-09-25 DIAGNOSIS — Z0000 Encounter for general adult medical examination without abnormal findings: Secondary | ICD-10-CM

## 2024-09-25 DIAGNOSIS — Z23 Encounter for immunization: Secondary | ICD-10-CM

## 2024-09-25 DIAGNOSIS — E785 Hyperlipidemia, unspecified: Secondary | ICD-10-CM

## 2024-09-25 DIAGNOSIS — E669 Obesity, unspecified: Secondary | ICD-10-CM

## 2024-09-25 LAB — COMPREHENSIVE METABOLIC PANEL WITH GFR
ALT: 10 U/L (ref 0–35)
AST: 16 U/L (ref 0–37)
Albumin: 4.2 g/dL (ref 3.5–5.2)
Alkaline Phosphatase: 39 U/L (ref 39–117)
BUN: 16 mg/dL (ref 6–23)
CO2: 37 meq/L — ABNORMAL HIGH (ref 19–32)
Calcium: 9.2 mg/dL (ref 8.4–10.5)
Chloride: 97 meq/L (ref 96–112)
Creatinine, Ser: 0.58 mg/dL (ref 0.40–1.20)
GFR: 95.49 mL/min (ref >60.00)
Glucose, Bld: 89 mg/dL (ref 70–99)
Potassium: 3.2 meq/L — ABNORMAL LOW (ref 3.5–5.1)
Sodium: 139 meq/L (ref 135–145)
Total Bilirubin: 0.4 mg/dL (ref 0.2–1.2)
Total Protein: 7 g/dL (ref 6.0–8.3)

## 2024-09-25 LAB — VITAMIN D 25 HYDROXY (VIT D DEFICIENCY, FRACTURES): VITD: 39.9 ng/mL (ref 30.00–100.00)

## 2024-09-25 LAB — CBC WITH DIFFERENTIAL/PLATELET
Basophils Absolute: 0 K/uL (ref 0.0–0.1)
Basophils Relative: 0.6 % (ref 0.0–3.0)
Eosinophils Absolute: 0.1 K/uL (ref 0.0–0.7)
Eosinophils Relative: 1.1 % (ref 0.0–5.0)
HCT: 36.7 % (ref 36.0–46.0)
Hemoglobin: 11.9 g/dL — ABNORMAL LOW (ref 12.0–15.0)
Lymphocytes Relative: 58 % — ABNORMAL HIGH (ref 12.0–46.0)
Lymphs Abs: 3.3 K/uL (ref 0.7–4.0)
MCHC: 32.5 g/dL (ref 30.0–36.0)
MCV: 80.1 fl (ref 78.0–100.0)
Monocytes Absolute: 0.4 K/uL (ref 0.1–1.0)
Monocytes Relative: 7.2 % (ref 3.0–12.0)
Neutro Abs: 1.9 K/uL (ref 1.4–7.7)
Neutrophils Relative %: 33.1 % — ABNORMAL LOW (ref 43.0–77.0)
Platelets: 399 K/uL (ref 150.0–400.0)
RBC: 4.58 Mil/uL (ref 3.87–5.11)
RDW: 15.7 % — ABNORMAL HIGH (ref 11.5–15.5)
WBC: 5.7 K/uL (ref 4.0–10.5)

## 2024-09-25 LAB — LIPID PANEL
Cholesterol: 169 mg/dL (ref 0–200)
HDL: 95.8 mg/dL (ref >39.00)
LDL Cholesterol: 61 mg/dL (ref 0–99)
NonHDL: 72.87
Total CHOL/HDL Ratio: 2
Triglycerides: 59 mg/dL (ref 0.0–149.0)
VLDL: 11.8 mg/dL (ref 0.0–40.0)

## 2024-09-25 LAB — TSH: TSH: 0.39 u[IU]/mL (ref 0.35–5.50)

## 2024-09-25 LAB — VITAMIN B12: Vitamin B-12: >1500 pg/mL — ABNORMAL HIGH (ref 211–911)

## 2024-09-25 LAB — HEMOGLOBIN A1C: Hgb A1c MFr Bld: 5.9 % (ref 4.6–6.5)

## 2024-09-25 NOTE — Progress Notes (Signed)
 Complete physical exam  Patient: Tanya [name]   DOB: 1961-07-28   63 y.o. Female  MRN: [id number]  Subjective:    Chief Complaint  Patient presents with   Establish Care    Tanya [name] is a 63 y.o. female who presents today for a complete physical exam. She reports consuming a low sodium and low carbohydrate diet. Exercise: Walking  5 days a week, 3.5 miles; regular exercise 4 days a week; Yoga on Tuesday. She generally feels well. She reports sleeping well. She does not have additional problems to discuss today.    Most recent fall risk assessment:    09/25/2024   10:04 AM  Fall Risk   Falls in the past year? 0  Number falls in past yr: 0  Injury with Fall? 0  Risk for fall due to : No Fall Risks  Follow up Falls evaluation completed     Most recent depression screenings:    09/25/2024   10:04 AM 08/13/2024    4:01 PM  PHQ 2/9 Scores  PHQ - 2 Score 0 0  PHQ- 9 Score 0 0      Data saved with a previous flowsheet row definition    Vision:Within last year and Dental: No current dental problems and Receives regular dental care  Past Medical History:  Diagnosis Date   Allergy    Diabetes mellitus    Family history of adverse reaction to anesthesia     My Mother would vomit    Goiter    Hyperlipidemia    Hypertension    Morbid obesity (HCC) 12/24/2013   OSA (obstructive sleep apnea)    Palpitations 08/01/2016   Sarcoid    Sarcoidosis    SBO (small bowel obstruction) (HCC) 07/25/2020   Vitamin D  deficiency    Past Surgical History:  Procedure Laterality Date   ABDOMINAL HYSTERECTOMY     achiles tendon     right   EYE SURGERY     biopsy at [facility]   LAPAROTOMY N/A 07/27/2020   Procedure: EXPLORATORY LAPAROTOMY, SMALL BOWEL RESECTION;  Surgeon: Signe Mitzie LABOR, MD;  Location: [facility] OR;  Service: General;  Laterality: N/A;   TONSILLECTOMY     Social History   Tobacco Use   Smoking status: Never   Smokeless tobacco: Never  Vaping Use   Vaping  status: Never Used  Substance Use Topics   Alcohol use: Yes    Comment: 2 glasses of wine a month   Drug use: No   Social History   Socioeconomic History   Marital status: Married    Spouse name: Not on file   Number of children: 0   Years of education: Not on file   Highest education level: Associate degree: occupational, scientist, product/process development, or vocational program  Occupational History   Not on file  Tobacco Use   Smoking status: Never   Smokeless tobacco: Never  Vaping Use   Vaping status: Never Used  Substance and Sexual Activity   Alcohol use: Yes    Comment: 2 glasses of wine a month   Drug use: No   Sexual activity: Yes    Partners: Male    Birth control/protection: Post-menopausal  Other Topics Concern   Not on file  Social History Narrative   Not on file   Social Drivers of Health   Financial Resource Strain: Low Risk  (02/23/2024)   Overall Financial Resource Strain (CARDIA)    Difficulty of Paying Living Expenses: Not hard at all  Food Insecurity: No Food Insecurity (02/23/2024)   Hunger Vital Sign    Worried About Running Out of Food in the Last Year: Never true    Ran Out of Food in the Last Year: Never true  Transportation Needs: No Transportation Needs (02/23/2024)   PRAPARE - Administrator, Civil Service (Medical): No    Lack of Transportation (Non-Medical): No  Physical Activity: Sufficiently Active (02/23/2024)   Exercise Vital Sign    Days of Exercise per Week: 5 days    Minutes of Exercise per Session: 90 min  Stress: No Stress Concern Present (02/23/2024)   [employer] of Occupational Health - Occupational Stress Questionnaire    Feeling of Stress : Not at all  Social Connections: Socially Integrated (02/23/2024)   Social Connection and Isolation Panel    Frequency of Communication with Friends and Family: More than three times a week    Frequency of Social Gatherings with Friends and Family: More than three times a week    Attends  Religious Services: More than 4 times per year    Active Member of [employer] or Organizations: Yes    Attends Banker Meetings: More than 4 times per year    Marital Status: Married  Catering Manager Violence: Not At Risk (02/23/2024)   Humiliation, Afraid, Rape, and Kick questionnaire    Fear of Current or Ex-Partner: No    Emotionally Abused: No    Physically Abused: No    Sexually Abused: No   Family Status  Relation Name Status   Mother  Alive   Father  Alive   Sister  Alive   Brother  Alive   MGM  Deceased   MGF  Deceased   PGM  Deceased   PGF  Deceased   Neg Hx  (Not Specified)  No partnership data on file   Family History  Problem Relation Age of Onset   Hypertension Mother    Diabetes Father    Heart disease Father    Hypertension Sister    Heart disease Sister    Hypertension Brother    Diabetes Paternal Grandmother    Heart disease Paternal Grandmother    Colon cancer Neg Hx    Esophageal cancer Neg Hx    Rectal cancer Neg Hx    Stomach cancer Neg Hx    Breast cancer Neg Hx    Allergies  Allergen Reactions   Amlodipine  Swelling   Naproxen Hives   Meloxicam & Diet Manage Prod Rash   Patient Care Team: Billy Philippe SAUNDERS, NP as PCP - General (Family Medicine) Vernetta Lonni GRADE, MD as Consulting Physician (Orthopedic Surgery)   Outpatient Medications Prior to Visit  Medication Sig   amLODipine  (NORVASC ) 2.5 MG tablet TAKE 1 TAB EVERY NIGHT FOR BP   aspirin  EC 81 MG tablet Take 81 mg by mouth daily.   bisoprolol -hydrochlorothiazide  (ZIAC ) 10-6.25 MG tablet TAKE 1 TABLET BY MOUTH EVERY DAY FOR BLOOD PRESSURE   Cyanocobalamin  (VITAMIN B-12 SL) Place 1 tablet under the tongue daily.    furosemide  (LASIX ) 40 MG tablet TAKE 1 TABLET BY MOUTH TWICE A DAY FOR BLOOD PRESSURE & FLUID RETENTION / ANKLE SWELLING   gabapentin  (NEURONTIN ) 100 MG capsule TAKE 1 CAPSULE 3 TIMES A DAY FOR CHRONIC PAIN (Patient taking differently: As needed)    hydrALAZINE  (APRESOLINE ) 25 MG tablet TAKE 1 TABLET 3 X /DAY FOR BP & HEART   Magnesium  250 MG TABS Take 250 mg by mouth daily.  metFORMIN  (GLUCOPHAGE -XR) 500 MG 24 hr tablet TAKE 2 TABLETS BY MOUTH TWICE A DAY WITH A MEAL FOR DIABETES   Multiple Vitamins-Minerals (MULTIVITAMIN PO) Take by mouth daily.   olmesartan  (BENICAR ) 40 MG tablet TAKE 1 TABLET DAILY FOR BLOOD PRESSURE & DIABETIC KIDNEY PROTECTION   RESTASIS 0.05 % ophthalmic emulsion Place 1 drop into both eyes 3 (three) times daily as needed for dry eyes. As needed   rosuvastatin  (CRESTOR ) 5 MG tablet TAKE 1 TABLET BY MOUTH ONE TIME PER WEEK   tirzepatide  (MOUNJARO ) 7.5 MG/0.5ML Pen Inject 7.5 mg into the skin once a week.   [DISCONTINUED] fluticasone  (FLONASE ) 50 MCG/ACT nasal spray Place 2 sprays into both nostrils daily. (Patient taking differently: Place 2 sprays into both nostrils daily as needed for allergies.)   [DISCONTINUED] nystatin  cream (MYCOSTATIN ) Apply 1 Application topically 2 (two) times daily. (Patient taking differently: Apply 1 Application topically 2 (two) times daily. As needed)   No facility-administered medications prior to visit.    ROS See HPI above     Objective:   BP 130/72   Pulse (!) 56   Temp 97.6 F (36.4 C) (Oral)   Ht 5' 4 (1.626 m)   Wt 191 lb (86.6 kg)   SpO2 98%   BMI 32.79 kg/m    Physical Exam Vitals reviewed.  Constitutional:      General: She is not in acute distress.    Appearance: Normal appearance. She is obese. She is not ill-appearing, toxic-appearing or diaphoretic.  HENT:     Head: Normocephalic and atraumatic.     Right Ear: Tympanic membrane, ear canal and external ear normal. There is no impacted cerumen.     Left Ear: Tympanic membrane, ear canal and external ear normal. There is no impacted cerumen.     Nose:     Right Sinus: No maxillary sinus tenderness or frontal sinus tenderness.     Left Sinus: No maxillary sinus tenderness or frontal sinus tenderness.      Mouth/Throat:     Mouth: Mucous membranes are moist.     Pharynx: Oropharynx is clear. Uvula midline. No pharyngeal swelling, oropharyngeal exudate or posterior oropharyngeal erythema.  Eyes:     General:        Right eye: No discharge.        Left eye: No discharge.     Conjunctiva/sclera: Conjunctivae normal.     Pupils: Pupils are equal, round, and reactive to light.     Comments: Wears glasses   Neck:     Thyroid : No thyromegaly.  Cardiovascular:     Rate and Rhythm: Normal rate and regular rhythm.     Pulses:          Posterior tibial pulses are 2+ on the right side and 2+ on the left side.     Heart sounds: Normal heart sounds. No murmur heard.    No friction rub. No gallop.  Pulmonary:     Effort: Pulmonary effort is normal. No respiratory distress.     Breath sounds: Normal breath sounds.  Abdominal:     General: Abdomen is flat. Bowel sounds are normal. There is no distension.     Palpations: Abdomen is soft. There is no mass.     Tenderness: There is no abdominal tenderness. There is no right CVA tenderness or left CVA tenderness.  Musculoskeletal:        General: Normal range of motion.     Cervical back: Normal range of motion.  Right lower leg: No edema.     Left lower leg: No edema.  Lymphadenopathy:     Cervical: No cervical adenopathy.  Skin:    General: Skin is warm and dry.  Neurological:     General: No focal deficit present.     Mental Status: She is alert and oriented to person, place, and time. Mental status is at baseline.     Motor: No weakness.     Gait: Gait normal.  Psychiatric:        Mood and Affect: Mood normal.        Behavior: Behavior normal.        Thought Content: Thought content normal.        Judgment: Judgment normal.        Assessment & Plan:    Routine Health Maintenance and Physical Exam  Immunization History  Administered Date(s) Administered   Fluzone  Influenza virus vaccine,trivalent (IIV3), split virus 09/12/2023    Influenza Inj Mdck Quad With Preservative 07/17/2018, 08/15/2019, 08/21/2020   Influenza Split 07/18/2013, 10/01/2014   Influenza, Seasonal, Injecte, Preservative Fre 09/25/2024   Influenza,inj,Quad PF,6+ Mos 09/10/2021, 09/10/2022   Influenza,inj,quad, With Preservative 09/02/2016   PFIZER Comirnaty(Gray Top)Covid-19 Tri-Sucrose Vaccine 07/21/2023   PFIZER(Purple Top)SARS-COV-2 Vaccination 01/05/2020, 02/12/2020   PNEUMOCOCCAL CONJUGATE-20 02/23/2024   Pfizer(Comirnaty)Fall Seasonal Vaccine 12 years and older 11/24/2022   Pneumococcal Polysaccharide-23 06/16/1999   Tdap 03/10/2009, 07/18/2019   Zoster Recombinant(Shingrix ) 03/23/2024, 05/23/2024    Health Maintenance  Topic Date Due   Fecal DNA (Cologuard)  Never done   COVID-19 Vaccine (5 - 2025-26 season) 07/02/2024   Diabetic kidney evaluation - Urine ACR  09/11/2024   HEMOGLOBIN A1C  11/23/2024   FOOT EXAM  03/23/2025   Diabetic kidney evaluation - eGFR measurement  05/23/2025   OPHTHALMOLOGY EXAM  09/24/2025   Mammogram  10/19/2025   Cervical Cancer Screening (HPV/Pap Cotest)  02/23/2027   DTaP/Tdap/Td (3 - Td or Tdap) 07/17/2029   Pneumococcal Vaccine: 50+ Years  Completed   Influenza Vaccine  Completed   Hepatitis C Screening  Completed   Zoster Vaccines- Shingrix   Completed   Hepatitis B Vaccines 19-59 Average Risk  Aged Out   HPV VACCINES  Aged Out   Meningococcal B Vaccine  Aged Out   Colonoscopy  Discontinued   HIV Screening  Discontinued    Discussed health benefits of physical activity, and encouraged her to engage in regular exercise appropriate for her age and condition.  Annual physical exam -     CBC with Differential/Platelet -     Comprehensive metabolic panel with GFR -     Hemoglobin A1c -     Lipid panel -     TSH -     VITAMIN D  25 Hydroxy (Vit-D Deficiency, Fractures) -     Vitamin B12  Immunization due -     Flu vaccine trivalent PF, 6mos and older(Flulaval,Afluria,Fluarix,Fluzone )  Type 2  diabetes mellitus in patient with obesity (HCC) -     Hemoglobin A1c -     Microalbumin / creatinine urine ratio  Primary hypertension -     Comprehensive metabolic panel with GFR  Hyperlipidemia associated with type 2 diabetes mellitus (HCC) -     Comprehensive metabolic panel with GFR -     Lipid panel  Vitamin D  deficiency -     VITAMIN D  25 Hydroxy (Vit-D Deficiency, Fractures)  B12 deficiency -     Vitamin B12  1.Review health maintenance:  -Influenza vaccine: Administered  -  Covid booster: May obtain at local pharmacy  -Cologuard: Has container at home. Encourage to complete.  2.Physical exam completed today.  3.Continue all medications. 4.Continue with a healthy diet and regular exercise.  5.Ordered labs and urine. Office will call with lab results and will be available via MyChart.  Return in about 6 months (around 03/25/2025) for chronic management.     Kijana Cromie, NP

## 2024-09-25 NOTE — Patient Instructions (Addendum)
-  It was great to see you today! Happy Thanksgiving! -Physical exam completed today.  -Influenza vaccine administered.  -May obtain covid booster at your local pharmacy. -Please complete your cologuard for colon/rectal screening. -Continue all medications. -Continue with a healthy diet and regular exercise.  -Ordered labs and urine. Office will call with lab results and will be available via MyChart. -Follow up in 6 months.

## 2024-09-26 ENCOUNTER — Ambulatory Visit: Payer: Self-pay | Admitting: Family Medicine

## 2024-09-26 DIAGNOSIS — E876 Hypokalemia: Secondary | ICD-10-CM

## 2024-09-26 LAB — MICROALBUMIN / CREATININE URINE RATIO
Creatinine,U: 14.3 mg/dL
Microalb Creat Ratio: UNDETERMINED mg/g (ref 0.0–30.0)
Microalb, Ur: <0.7 mg/dL

## 2024-10-04 ENCOUNTER — Ambulatory Visit: Admitting: Sports Medicine

## 2024-10-04 ENCOUNTER — Encounter: Payer: Self-pay | Admitting: Sports Medicine

## 2024-10-04 DIAGNOSIS — M7531 Calcific tendinitis of right shoulder: Secondary | ICD-10-CM

## 2024-10-04 DIAGNOSIS — M19011 Primary osteoarthritis, right shoulder: Secondary | ICD-10-CM | POA: Diagnosis not present

## 2024-10-04 NOTE — Progress Notes (Signed)
 Tanya [name] - 63 y.o. female MRN [id number]  Date of birth: [birth year]  Office Visit Note: Visit Date: 10/04/2024 PCP: Billy Philippe JONELLE, NP Referred by: Billy Philippe JONELLE, NP  Subjective: Chief Complaint  Patient presents with   Right Shoulder - Follow-up   HPI: Tanya [name] is a pleasant 63 y.o. female who presents today for follow-up of chronic right shoulder pain.  Her shoulder is doing much better.  Back in November we did perform an ultrasound-guided glenohumeral joint injection, she had 100% pain relief for the first 2 weeks or so, following this her pain is still much better but she has felt some slight pain with reaching and certain activities.  In general she is able to manage this well just movements with workout classes, exercise, etc. She is not requiring any medication for this consistently.  Pertinent ROS were reviewed with the patient and found to be negative unless otherwise specified above in HPI.   Assessment & Plan: Visit Diagnoses:  1. Primary osteoarthritis, right shoulder   2. Calcific tendinitis of right shoulder    Plan: Impression is chronic right shoulder pain which is largely improved after glenohumeral joint injection.  Her pathology is a combination of advancing osteoarthritic change of the shoulder as well as a degree of calcific tendinosis with relatively well-preserved rotator cuff strength.  At this point, I think she would fit from physical therapy to help stabilize the rotator cuff tendinosis and work for range of motion given her OA.  She prefers home therapy versus formal PT.  We did print out a customized handout for her shoulder and my athletic trainer, Jinnie, did review these exercises with her in the room today.  She will perform these once daily can be consistent with these for at least the next 6 weeks.  She will follow-up with me as needed. Ok for tylenol , heat/ice, as needed.  If she is still having discomfort despite this, we  discussed performing ultrasound-guided calcific tenotomy/SAJ injection.  Follow-up: Return for 6-weeks if not significantly improved .   Meds & Orders: No orders of the defined types were placed in this encounter.  No orders of the defined types were placed in this encounter.    Procedures: No procedures performed      Clinical History: Electrodiagnostic study of the left upper limb 12/21/2018 Impression: The above electrodiagnostic study is ABNORMAL and reveals evidence of a moderate left median nerve entrapment at the wrist (carpal tunnel syndrome) affecting sensory and motor components.    There is no significant electrodiagnostic evidence of any other focal nerve entrapment, brachial plexopathy or cervical radiculopathy.    Recommendations: 1.  Follow-up with referring physician. 2.  Continue current management of symptoms. 3.  Continue use of resting splint at night-time and as needed during the day. 4.  Suggest surgical evaluation.  She reports that she has never smoked. She has never used smokeless tobacco.  Recent Labs    02/23/24 1207 05/23/24 1048 09/25/24 1042  HGBA1C 6.1 6.3 5.9    Objective:    Physical Exam  Gen: Well-appearing, in no acute distress; non-toxic CV: Well-perfused. Warm.  Resp: Breathing unlabored on room air; no wheezing. Psych: Fluid speech in conversation; appropriate affect; normal thought process  Ortho Exam - Right shoulder: No redness swelling or effusion.  There is some tenderness around Codman's point.  About 10 degrees less of forward flexion and abduction both actively and passively.  Negative drop arm test.  Negative Hawkins impingement.  There is mild discomfort with resisted abduction and internal rotation although relatively good strength cuff testing.  Imaging:  Narrative & Impression  EXAM: 1 VIEW XRAY OF THE RIGHT SHOULDER 09/07/2024 10:11:48 AM   COMPARISON: None available.   CLINICAL HISTORY: right shoulder pain    FINDINGS:   BONES AND JOINTS: Glenohumeral joint is normally aligned. No acute fracture or dislocation. The bone mineralization is normal.   There is trace spurring at the [facility] joint and mild spurring along the humeral cuff insertion.   SOFT TISSUES: There are soft tissue calcifications of the plane of the anterior and posterior cuff insertion, which appear consistent with chronic calcific tendinosis.   The visualized right lung is unremarkable. There is calcification in the aortic arch.   IMPRESSION: 1. No acute fracture or dislocation. 2. Soft tissue calcifications at the anterior and posterior cuff insertion, consistent with chronic calcific tendinosis. 3. Trace spurring at the [facility] joint and mild spurring along the humeral cuff insertion.   Electronically signed by: Francis Quam MD 09/11/2024 09:58 PM EST RP Workstation: [id number]    Past Medical/Family/Surgical/Social History: Medications & Allergies reviewed per EMR, new medications updated. Patient Active Problem List   Diagnosis Date Noted   Skin candidiasis 02/23/2024   OSA (obstructive sleep apnea)    S/P small bowel resection 09/08/2020   B12 deficiency 09/08/2020   Carpal tunnel syndrome, left upper limb 12/25/2018   Chronic right-sided low back pain with right-sided sciatica 06/01/2017   Tortuous aorta 05/20/2017   Obesity (BMI 30.0-34.9) 12/24/2013   Hypertension    Hyperlipidemia associated with type 2 diabetes mellitus (HCC)    Type 2 diabetes mellitus with obesity    Environmental allergies    Obesity hypoventilation syndrome (HCC)     Goiter    Vitamin D  deficiency    Past Medical History:  Diagnosis Date   Allergy    Diabetes mellitus    Family history of adverse reaction to anesthesia     My Mother would vomit    Goiter    Hyperlipidemia    Hypertension    Morbid obesity (HCC) 12/24/2013   OSA (obstructive sleep apnea)    Palpitations 08/01/2016   Sarcoid    Sarcoidosis    SBO (small  bowel obstruction) (HCC) 07/25/2020   Vitamin D  deficiency    Family History  Problem Relation Age of Onset   Hypertension Mother    Diabetes Father    Heart disease Father    Hypertension Sister    Heart disease Sister    Hypertension Brother    Diabetes Paternal Grandmother    Heart disease Paternal Grandmother    Colon cancer Neg Hx    Esophageal cancer Neg Hx    Rectal cancer Neg Hx    Stomach cancer Neg Hx    Breast cancer Neg Hx    Past Surgical History:  Procedure Laterality Date   ABDOMINAL HYSTERECTOMY     achiles tendon     right   EYE SURGERY     biopsy at [facility]   LAPAROTOMY N/A 07/27/2020   Procedure: EXPLORATORY LAPAROTOMY, SMALL BOWEL RESECTION;  Surgeon: Signe Mitzie LABOR, MD;  Location: [facility] OR;  Service: General;  Laterality: N/A;   TONSILLECTOMY     Social History   Occupational History   Not on file  Tobacco Use   Smoking status: Never   Smokeless tobacco: Never  Vaping Use   Vaping status: Never Used  Substance and Sexual Activity   Alcohol use:  Yes    Comment: 2 glasses of wine a month   Drug use: No   Sexual activity: Yes    Partners: Male    Birth control/protection: Post-menopausal

## 2024-10-04 NOTE — Progress Notes (Signed)
 Patient says that she got 100% relief from her injection initially, but it has since worn off some. She says that for a few days, she was able to move and use the shoulder without noticing any discomfort. She began at the gym again, without using weights in the right hand, and says that her pain/discomfort returned with some of her ROM.  Patient was instructed in 10 minutes of therapeutic exercises for right shoulder to improve strength, ROM and function according to my instructions and plan of care by a Certified Athletic Trainer during the office visit. A customized handout was provided and demonstration of proper technique shown and discussed. Patient did perform exercises and demonstrate understanding through teachback.  All questions discussed and answered.

## 2024-10-07 ENCOUNTER — Encounter: Payer: Self-pay | Admitting: Sports Medicine

## 2024-10-19 ENCOUNTER — Other Ambulatory Visit

## 2024-10-19 ENCOUNTER — Ambulatory Visit: Payer: Self-pay | Admitting: Family Medicine

## 2024-10-19 DIAGNOSIS — E876 Hypokalemia: Secondary | ICD-10-CM | POA: Diagnosis not present

## 2024-10-19 LAB — COMPREHENSIVE METABOLIC PANEL WITH GFR
ALT: 8 U/L (ref 3–35)
AST: 15 U/L (ref 5–37)
Albumin: 4.2 g/dL (ref 3.5–5.2)
Alkaline Phosphatase: 45 U/L (ref 39–117)
BUN: 15 mg/dL (ref 6–23)
CO2: 30 meq/L (ref 19–32)
Calcium: 9.5 mg/dL (ref 8.4–10.5)
Chloride: 100 meq/L (ref 96–112)
Creatinine, Ser: 0.53 mg/dL (ref 0.40–1.20)
GFR: 97.55 mL/min
Glucose, Bld: 90 mg/dL (ref 70–99)
Potassium: 4.2 meq/L (ref 3.5–5.1)
Sodium: 139 meq/L (ref 135–145)
Total Bilirubin: 0.3 mg/dL (ref 0.2–1.2)
Total Protein: 6.8 g/dL (ref 6.0–8.3)

## 2024-10-24 ENCOUNTER — Encounter: Payer: Self-pay | Admitting: Sports Medicine

## 2024-10-24 ENCOUNTER — Other Ambulatory Visit: Payer: Self-pay

## 2024-10-24 ENCOUNTER — Ambulatory Visit: Admitting: Sports Medicine

## 2024-10-24 DIAGNOSIS — M25511 Pain in right shoulder: Secondary | ICD-10-CM

## 2024-10-24 DIAGNOSIS — G8929 Other chronic pain: Secondary | ICD-10-CM | POA: Diagnosis not present

## 2024-10-24 DIAGNOSIS — M7531 Calcific tendinitis of right shoulder: Secondary | ICD-10-CM | POA: Diagnosis not present

## 2024-10-24 DIAGNOSIS — M19011 Primary osteoarthritis, right shoulder: Secondary | ICD-10-CM

## 2024-10-24 MED ORDER — BUPIVACAINE HCL 0.25 % IJ SOLN
2.0000 mL | INTRAMUSCULAR | Status: AC | PRN
  Administered 2024-10-24: 2 mL via INTRA_ARTICULAR

## 2024-10-24 MED ORDER — BETAMETHASONE SOD PHOS & ACET 6 (3-3) MG/ML IJ SUSP
9.0000 mg | INTRAMUSCULAR | Status: AC | PRN
  Administered 2024-10-24: 9 mg via INTRA_ARTICULAR

## 2024-10-24 MED ORDER — LIDOCAINE HCL 1 % IJ SOLN
2.0000 mL | INTRAMUSCULAR | Status: AC | PRN
  Administered 2024-10-24: 2 mL

## 2024-10-24 NOTE — Progress Notes (Signed)
 Patient says that her shoulder is not as bad as it was when she sent a MyChart message update a few weeks ago, but still has discomfort. She is here for injection today.

## 2024-10-24 NOTE — Progress Notes (Signed)
 "  [name] - 63 y.o. female MRN [id number]  Date of birth: 04-Aug-1961  Office Visit Note: Visit Date: 10/24/2024 PCP: Billy Philippe JONELLE, NP Referred by: Billy Philippe JONELLE, NP  Subjective: Chief Complaint  Patient presents with   Right Shoulder - Follow-up   HPI: Tanya [name] is a pleasant 63 y.o. female who presents today for chronic right shoulder pain.  Overall, Tanya [name]'s shoulder is doing better, but still having pain.  She was initially seen by Dr. Jerri, sent to me for ultrasound-guided glenohumeral joint injection on 09/21/2024.  This initially gave her very good relief of her pain although did not resolve completely.  She had an episode 2 weeks ago where she had a flareup of her pain for a few days but this has largely subsided.  Uses Aleve just as needed.  Still having pain over the lateral and anterior aspect of the shoulder, worse with reaching.  Lab Results  Component Value Date   HGBA1C 5.9 09/25/2024    Pertinent ROS were reviewed with the patient and found to be negative unless otherwise specified above in HPI.   Assessment & Plan: Visit Diagnoses:  1. Calcific tendinitis of right shoulder   2. Primary osteoarthritis, right shoulder   3. Chronic right shoulder pain    Plan: Impression is chronic and improving right shoulder pain which is multifactorial in nature as she does have at least moderate arthritic change in the shoulder but also degree of rotator cuff calcific tendinopathy.  She received good relief from glenohumeral joint injection but still having more pain with activation of the rotator cuff.  Through shared decision making, we did proceed with ultrasound-guided rotator cuff calcific tenotomy and SAJ-injection, patient tolerated well.  Discussed postinjection protocol.  Recommended ice twice daily and Aleve for the next 2-3 days with soreness.  Starting in January 1, she will then resume her home rehab PT exercises on a consistent basis.  Will see how she  does then over the next month and she will follow-up with us  as needed.  If for some reason her pain still continues, next step would likely be consideration of a shoulder MRI, but would leave this up to Dr. Jerri her original evaluating physician.  Follow-up: Return if symptoms worsen or fail to improve.   Meds & Orders: No orders of the defined types were placed in this encounter.   Orders Placed This Encounter  Procedures   Large Joint Inj: R subacromial bursa   US  Guided Needle Placement - No Linked Charges     Procedures: Large Joint Inj: R subacromial bursa on 10/24/2024 9:03 AM Indications: pain and diagnostic evaluation Details: 22 G 1.5 in needle, ultrasound-guided lateral approach Medications: 2 mL lidocaine  1 %; 2 mL bupivacaine  0.25 %; 9 mg betamethasone  acetate-betamethasone  sodium phosphate  6 (3-3) MG/ML Outcome: tolerated well, no immediate complications  US -Guided RC Calcific Tendon Tenotomy and SAJ Injection, Right Shoulder  After discussion on risks/benefits/indications, informed verbal consent was obtained. A timeout was then performed. Patient was seated on table in exam room. The patient's shoulder was prepped with chloraprep and alcohol swabs and utilizing lateral approach with ultrasound guidance, the patient's supraspinatus tendon and the origin of the calcification was identified. The area was anesthesized first with 5 mL of lidocaine  1%. Following analgesia, using a 22-G, 1.5 needle under ultrasound guidance, the needle was inserted into the calcification of the supraspinatus and in-plane tenotomy was performed with mutliple needle fenestrations. The calcification as well as the  subacromial bursa was then subsequently injected with 2:2:1.5 mixture of lidocaine :bupivicaine:betamethasone  via an in-plane approach. Patient tolerated the procedure well without immediate complications.    Procedure, treatment alternatives, risks and benefits explained, specific risks  discussed. Consent was given by the patient. Immediately prior to procedure a time out was called to verify the correct patient, procedure, equipment, support staff and site/side marked as required. Patient was prepped and draped in the usual sterile fashion.          Clinical History:  She reports that she has never smoked. She has never used smokeless tobacco.  Recent Labs    02/23/24 1207 05/23/24 1048 09/25/24 1042  HGBA1C 6.1 6.3 5.9    Objective:    Physical Exam  Gen: Well-appearing, in no acute distress; non-toxic CV: Well-perfused. Warm.  Resp: Breathing unlabored on room air; no wheezing. Psych: Fluid speech in conversation; appropriate affect; normal thought process  Ortho Exam - Right shoulder: No redness swelling or effusion of the shoulder.  There is mild pain at Codman's point in the anterior joint recess.  There is pain with abduction and endrange flexion.  Imaging:  Narrative & Impression  EXAM: 1 VIEW XRAY OF THE RIGHT SHOULDER 09/07/2024 10:11:48 AM   COMPARISON: None available.   CLINICAL HISTORY: right shoulder pain   FINDINGS:   BONES AND JOINTS: Glenohumeral joint is normally aligned. No acute fracture or dislocation. The bone mineralization is normal.   There is trace spurring at the [facility] joint and mild spurring along the humeral cuff insertion.   SOFT TISSUES: There are soft tissue calcifications of the plane of the anterior and posterior cuff insertion, which appear consistent with chronic calcific tendinosis.   The visualized right lung is unremarkable. There is calcification in the aortic arch.   IMPRESSION: 1. No acute fracture or dislocation. 2. Soft tissue calcifications at the anterior and posterior cuff insertion, consistent with chronic calcific tendinosis. 3. Trace spurring at the [facility] joint and mild spurring along the humeral cuff insertion.   Electronically signed by: Francis Quam MD 09/11/2024 09:58 PM EST  RP Workstation: [id number]    Past Medical/Family/Surgical/Social History: Medications & Allergies reviewed per EMR, new medications updated. Patient Active Problem List   Diagnosis Date Noted   Skin candidiasis 02/23/2024   OSA (obstructive sleep apnea)    S/P small bowel resection 09/08/2020   B12 deficiency 09/08/2020   Carpal tunnel syndrome, left upper limb 12/25/2018   Chronic right-sided low back pain with right-sided sciatica 06/01/2017   Tortuous aorta 05/20/2017   Obesity (BMI 30.0-34.9) 12/24/2013   Hypertension    Hyperlipidemia associated with type 2 diabetes mellitus (HCC)    Type 2 diabetes mellitus with obesity    Environmental allergies    Obesity hypoventilation syndrome (HCC)     Goiter    Vitamin D  deficiency    Past Medical History:  Diagnosis Date   Allergy    Diabetes mellitus    Family history of adverse reaction to anesthesia     My Mother would vomit    Goiter    Hyperlipidemia    Hypertension    Morbid obesity (HCC) 12/24/2013   OSA (obstructive sleep apnea)    Palpitations 08/01/2016   Sarcoid    Sarcoidosis    SBO (small bowel obstruction) (HCC) 07/25/2020   Vitamin D  deficiency    Family History  Problem Relation Age of Onset   Hypertension Mother    Diabetes Father    Heart disease Father  Hypertension Sister    Heart disease Sister    Hypertension Brother    Diabetes Paternal Grandmother    Heart disease Paternal Grandmother    Colon cancer Neg Hx    Esophageal cancer Neg Hx    Rectal cancer Neg Hx    Stomach cancer Neg Hx    Breast cancer Neg Hx    Past Surgical History:  Procedure Laterality Date   ABDOMINAL HYSTERECTOMY     achiles tendon     right   EYE SURGERY     biopsy at [facility]   LAPAROTOMY N/A 07/27/2020   Procedure: EXPLORATORY LAPAROTOMY, SMALL BOWEL RESECTION;  Surgeon: Signe Mitzie LABOR, MD;  Location: [facility] OR;  Service: General;  Laterality: N/A;   TONSILLECTOMY     Social History   Occupational  History   Not on file  Tobacco Use   Smoking status: Never   Smokeless tobacco: Never  Vaping Use   Vaping status: Never Used  Substance and Sexual Activity   Alcohol use: Yes    Comment: 2 glasses of wine a month   Drug use: No   Sexual activity: Yes    Partners: Male    Birth control/protection: Post-menopausal   "

## 2024-10-27 ENCOUNTER — Other Ambulatory Visit: Payer: Self-pay | Admitting: Family Medicine

## 2024-10-30 ENCOUNTER — Other Ambulatory Visit: Payer: Self-pay | Admitting: Family Medicine

## 2024-11-08 ENCOUNTER — Ambulatory Visit
Admission: RE | Admit: 2024-11-08 | Discharge: 2024-11-08 | Disposition: A | Discharge status: Home / Self Care | Source: Ambulatory Visit | Attending: Family Medicine | Admitting: Family Medicine

## 2024-11-13 ENCOUNTER — Encounter: Payer: Self-pay | Admitting: Family Medicine

## 2024-11-13 ENCOUNTER — Ambulatory Visit: Payer: Self-pay | Admitting: Family Medicine

## 2024-11-14 ENCOUNTER — Other Ambulatory Visit: Payer: Self-pay

## 2024-11-14 DIAGNOSIS — E669 Obesity, unspecified: Secondary | ICD-10-CM

## 2024-11-14 MED ORDER — MOUNJARO 7.5 MG/0.5ML ~~LOC~~ SOAJ: 7.5000 mg | SUBCUTANEOUS | 0 refills | Status: AC

## 2025-03-26 ENCOUNTER — Ambulatory Visit: Admitting: Family Medicine
